# Patient Record
Sex: Female | Born: 1985
Health system: Southern US, Community
[De-identification: ages and names within clinical notes are randomized; demographics above are authoritative.]

## PROBLEM LIST (undated history)

## (undated) DIAGNOSIS — K5900 Constipation, unspecified: Secondary | ICD-10-CM

## (undated) DIAGNOSIS — F419 Anxiety disorder, unspecified: Secondary | ICD-10-CM

## (undated) DIAGNOSIS — F32A Depression, unspecified: Secondary | ICD-10-CM

## (undated) DIAGNOSIS — D649 Anemia, unspecified: Secondary | ICD-10-CM

## (undated) DIAGNOSIS — R0602 Shortness of breath: Secondary | ICD-10-CM

## (undated) DIAGNOSIS — R002 Palpitations: Secondary | ICD-10-CM

## (undated) DIAGNOSIS — F909 Attention-deficit hyperactivity disorder, unspecified type: Secondary | ICD-10-CM

## (undated) DIAGNOSIS — K259 Gastric ulcer, unspecified as acute or chronic, without hemorrhage or perforation: Secondary | ICD-10-CM

## (undated) DIAGNOSIS — F988 Other specified behavioral and emotional disorders with onset usually occurring in childhood and adolescence: Secondary | ICD-10-CM

## (undated) DIAGNOSIS — M549 Dorsalgia, unspecified: Secondary | ICD-10-CM

## (undated) DIAGNOSIS — M439 Deforming dorsopathy, unspecified: Secondary | ICD-10-CM

## (undated) DIAGNOSIS — K219 Gastro-esophageal reflux disease without esophagitis: Secondary | ICD-10-CM

## (undated) DIAGNOSIS — K589 Irritable bowel syndrome without diarrhea: Secondary | ICD-10-CM

## (undated) HISTORY — DX: Constipation, unspecified: K59.00

## (undated) HISTORY — DX: Dorsalgia, unspecified: M54.9

## (undated) HISTORY — PX: WISDOM TOOTH EXTRACTION: SHX21

## (undated) HISTORY — DX: Depression, unspecified: F32.A

## (undated) HISTORY — DX: Shortness of breath: R06.02

## (undated) HISTORY — DX: Irritable bowel syndrome, unspecified: K58.9

## (undated) HISTORY — DX: Palpitations: R00.2

## (undated) HISTORY — DX: Deforming dorsopathy, unspecified: M43.9

## (undated) HISTORY — DX: Gastro-esophageal reflux disease without esophagitis: K21.9

## (undated) HISTORY — DX: Anxiety disorder, unspecified: F41.9

## (undated) HISTORY — DX: Other specified behavioral and emotional disorders with onset usually occurring in childhood and adolescence: F98.8

## (undated) HISTORY — DX: Gastric ulcer, unspecified as acute or chronic, without hemorrhage or perforation: K25.9

## (undated) HISTORY — DX: Anemia, unspecified: D64.9

## (undated) HISTORY — DX: Attention-deficit hyperactivity disorder, unspecified type: F90.9

---

## 2012-10-04 ENCOUNTER — Telehealth: Payer: Self-pay | Admitting: Family Medicine

## 2012-10-04 ENCOUNTER — Ambulatory Visit (INDEPENDENT_AMBULATORY_CARE_PROVIDER_SITE_OTHER): Payer: BC Managed Care – PPO | Admitting: Physician Assistant

## 2012-10-04 ENCOUNTER — Encounter: Payer: Self-pay | Admitting: Physician Assistant

## 2012-10-04 VITALS — BP 127/80 | HR 78 | Temp 97.3°F | Ht 64.0 in | Wt 233.0 lb

## 2012-10-04 DIAGNOSIS — M542 Cervicalgia: Secondary | ICD-10-CM

## 2012-10-04 DIAGNOSIS — G44209 Tension-type headache, unspecified, not intractable: Secondary | ICD-10-CM

## 2012-10-04 DIAGNOSIS — R5383 Other fatigue: Secondary | ICD-10-CM

## 2012-10-04 DIAGNOSIS — R5381 Other malaise: Secondary | ICD-10-CM

## 2012-10-04 DIAGNOSIS — R7989 Other specified abnormal findings of blood chemistry: Secondary | ICD-10-CM

## 2012-10-04 LAB — THYROID PANEL WITH TSH
Free Thyroxine Index: 3.2 (ref 1.0–3.9)
T4, Total: 9.8 ug/dL (ref 5.0–12.5)
TSH: 1.715 u[IU]/mL (ref 0.350–4.500)

## 2012-10-04 LAB — POCT CBC
Granulocyte percent: 73.3 %G (ref 37–80)
Lymph, poc: 2.4 (ref 0.6–3.4)
MPV: 8.1 fL (ref 0–99.8)
POC LYMPH PERCENT: 22.2 %L (ref 10–50)
Platelet Count, POC: 364 10*3/uL (ref 142–424)
RBC: 4.5 M/uL (ref 4.04–5.48)

## 2012-10-04 LAB — BASIC METABOLIC PANEL WITH GFR
Chloride: 101 mEq/L (ref 96–112)
Creat: 0.59 mg/dL (ref 0.50–1.10)
GFR, Est African American: >89 mL/min
GFR, Est Non African American: >89 mL/min
Potassium: 4.4 mEq/L (ref 3.5–5.3)

## 2012-10-04 LAB — HEPATIC FUNCTION PANEL
ALT: 11 U/L (ref 0–35)
Albumin: 3.9 g/dL (ref 3.5–5.2)
Alkaline Phosphatase: 112 U/L (ref 39–117)
Total Protein: 6.7 g/dL (ref 6.0–8.3)

## 2012-10-04 MED ORDER — CYCLOBENZAPRINE HCL 10 MG PO TABS: 10.0000 mg | ORAL_TABLET | Freq: Three times a day (TID) | ORAL | Status: DC | PRN

## 2012-10-04 MED ORDER — MELOXICAM 15 MG PO TABS: 15.0000 mg | ORAL_TABLET | Freq: Every day | ORAL | Status: DC

## 2012-10-04 NOTE — Telephone Encounter (Signed)
APPT GIVEN

## 2012-10-04 NOTE — Progress Notes (Signed)
Subjective:     Patient ID: Brittney Farmer, female   DOB: 1985/11/30, 27 y.o.   MRN: [id number]  HPI Pt with intermit post HA over the last several weeks Headaches will radiate to the top in stocking cap fashion Sx have now gotten worse over the last several days She does not have a migraine hx No prev head trauma + N, and photophobia She states she is under a lot of stress at work She also spends a lot of time on the computer at work No meds tried Pt also with some concern of fatigue and weight gain   Review of Systems  All other systems reviewed and are negative.       Objective:   Physical Exam  Nursing note and vitals reviewed.  PERRLA EOMI CN 2-12 intact FROM C-spine with sx on flexion/rotation + TTP entire trap region Shoulder shrug equal Good strength UE bilat Pulses/sensory good    Assessment:     1. Other malaise and fatigue   2. Elevated LFTs   3. Neck pain   4. Tension headache        Plan:     Mobic/Flexeril rx for headache- SE reviewed Heat/Ice Massage Stretching Make sure of proper position of computer screen at work  Work note given F/U prn

## 2012-10-04 NOTE — Patient Instructions (Signed)

## 2013-05-16 ENCOUNTER — Ambulatory Visit (INDEPENDENT_AMBULATORY_CARE_PROVIDER_SITE_OTHER): Payer: BC Managed Care – PPO | Admitting: Nurse Practitioner

## 2013-05-16 ENCOUNTER — Encounter: Payer: Self-pay | Admitting: Nurse Practitioner

## 2013-05-16 VITALS — BP 119/75 | HR 75 | Temp 97.1°F | Ht 64.0 in | Wt 236.0 lb

## 2013-05-16 DIAGNOSIS — J019 Acute sinusitis, unspecified: Secondary | ICD-10-CM

## 2013-05-16 DIAGNOSIS — R51 Headache: Secondary | ICD-10-CM

## 2013-05-16 MED ORDER — CHLORPHEN-PE-ACETAMINOPHEN 4-10-325 MG PO TABS: 1.0000 | ORAL_TABLET | Freq: Every day | ORAL | Status: DC

## 2013-05-16 MED ORDER — AZITHROMYCIN 250 MG PO TABS
ORAL_TABLET | ORAL | Status: DC
Start: 2013-05-16 — End: 2014-02-14

## 2013-05-16 NOTE — Progress Notes (Signed)
   Subjective:    Patient ID: Brittney Farmer, female    DOB: Sep 02, 1985, 28 y.o.   MRN: [id number]  HPI Patient in c/o headache- Brittney Farmer that she went to urgent care 2 weeks ago and was diagnosed with URI and was given prednisone, cough meds and tylenol with codeine. Patient says that she is no better and has had a headache intermittently for 2 weeks.     Review of Systems  Constitutional: Negative for fever, chills and appetite change.  HENT: Positive for congestion, rhinorrhea, sinus pressure and sore throat. Negative for trouble swallowing.   Respiratory: Positive for cough (slight).   Cardiovascular: Negative.   All other systems reviewed and are negative.       Objective:   Physical Exam  Constitutional: She is oriented to person, place, and time. She appears well-developed and well-nourished.  HENT:  Right Ear: Hearing, tympanic membrane, external ear and ear canal normal.  Left Ear: Hearing, tympanic membrane, external ear and ear canal normal.  Nose: Mucosal edema and rhinorrhea present. Right sinus exhibits frontal sinus tenderness. Right sinus exhibits no maxillary sinus tenderness. Left sinus exhibits frontal sinus tenderness.  Mouth/Throat: Uvula is midline, oropharynx is clear and moist and mucous membranes are normal.  Eyes: Pupils are equal, round, and reactive to light.  Neck: Normal range of motion. Neck supple.  Cardiovascular: Normal rate, regular rhythm and normal heart sounds.   Pulmonary/Chest: Effort normal and breath sounds normal. No respiratory distress. She has no wheezes. She has no rales.  Wet cough  Abdominal: Soft. Bowel sounds are normal.  Lymphadenopathy:    She has no cervical adenopathy.  Neurological: She is alert and oriented to person, place, and time.  Skin: Skin is warm and dry.    BP 119/75  Pulse 75  Temp(Src) 97.1 F (36.2 C) (Oral)  Ht 5\' 4"  (1.626 m)  Wt 236 lb (107.049 kg)  BMI 40.49 kg/m2  LMP 05/08/2013       Assessment &  Plan:   1. Headache(784.0)   2. Sinusitis, acute    Meds ordered this encounter  Medications  . azithromycin (ZITHROMAX Z-PAK) 250 MG tablet    Sig: As directed    Dispense:  6 each    Refill:  0    Order Specific Question:  Supervising Provider    Answer:  Chipper Herb [[id number]]  . Chlorphen-PE-Acetaminophen 4-10-325 MG TABS    Sig: Take 1 tablet by mouth daily.    Dispense:  20 tablet    Refill:  0    Order Specific Question:  Supervising Provider    Answer:  Chipper Herb [[id number]]   1. Take meds as prescribed 2. Use a cool mist humidifier especially during the winter months and when heat has been humid. 3. Use saline nose sprays frequently 4. Saline irrigations of the nose can be very helpful if done frequently.  * 4X daily for 1 week*  * Use of a nettie pot can be helpful with this. Follow directions with this* 5. Drink plenty of fluids 6. Keep thermostat turn down low 7.For any cough or congestion  Use plain Mucinex- regular strength or max strength is fine   * Children- consult with Pharmacist for dosing 8. For fever or aces or pains- take tylenol or ibuprofen appropriate for age and weight.  * for fevers greater than 101 orally you may alternate ibuprofen and tylenol every  3 hours.   Mary-Margaret Hassell Done, FNP

## 2013-05-16 NOTE — Patient Instructions (Signed)

## 2014-02-11 ENCOUNTER — Telehealth: Payer: Self-pay | Admitting: Family Medicine

## 2014-02-11 NOTE — Telephone Encounter (Signed)
Appointment scheduled for Friday with [facility]

## 2014-02-14 ENCOUNTER — Encounter: Payer: Self-pay | Admitting: Family Medicine

## 2014-02-14 ENCOUNTER — Ambulatory Visit (INDEPENDENT_AMBULATORY_CARE_PROVIDER_SITE_OTHER): Payer: Medicaid Other | Admitting: Family Medicine

## 2014-02-14 VITALS — BP 127/70 | HR 90 | Temp 98.2°F | Ht 64.0 in | Wt 222.8 lb

## 2014-02-14 DIAGNOSIS — K2100 Gastro-esophageal reflux disease with esophagitis, without bleeding: Secondary | ICD-10-CM

## 2014-02-14 DIAGNOSIS — R1011 Right upper quadrant pain: Secondary | ICD-10-CM

## 2014-02-14 DIAGNOSIS — F411 Generalized anxiety disorder: Secondary | ICD-10-CM

## 2014-02-14 DIAGNOSIS — F32A Depression, unspecified: Secondary | ICD-10-CM

## 2014-02-14 DIAGNOSIS — R5383 Other fatigue: Secondary | ICD-10-CM

## 2014-02-14 DIAGNOSIS — F329 Major depressive disorder, single episode, unspecified: Secondary | ICD-10-CM

## 2014-02-14 LAB — POCT CBC
Granulocyte percent: 67.5 %G (ref 37–80)
HCT, POC: 38.7 % (ref 37.7–47.9)
Hemoglobin: 12.2 g/dL (ref 12.2–16.2)
Lymph, poc: 2.2 (ref 0.6–3.4)
MCH, POC: 25.7 pg — AB (ref 27–31.2)
MCHC: 31.6 g/dL — AB (ref 31.8–35.4)
MCV: 81.3 fL (ref 80–97)
MPV: 8.4 fL (ref 0–99.8)
POC Granulocyte: 5.5 (ref 2–6.9)
POC LYMPH PERCENT: 27.5 %L (ref 10–50)
Platelet Count, POC: 365 10*3/uL (ref 142–424)
RBC: 4.8 M/uL (ref 4.04–5.48)
RDW, POC: 15 %
WBC: 8.1 10*3/uL (ref 4.6–10.2)

## 2014-02-14 MED ORDER — OMEPRAZOLE 20 MG PO CPDR: 20.0000 mg | DELAYED_RELEASE_CAPSULE | Freq: Every day | ORAL | Status: DC

## 2014-02-14 MED ORDER — SERTRALINE HCL 50 MG PO TABS
50.0000 mg | ORAL_TABLET | Freq: Every day | ORAL | Status: DC
Start: 2014-02-14 — End: 2014-06-06

## 2014-02-14 NOTE — Progress Notes (Signed)
Subjective:    Patient ID: Brittney Farmer, female    DOB: 11/15/1985, 28 y.o.   MRN: [id number]  HPI This 28 y.o. female presents for evaluation of having some abdominal pain.  She had EGD and it shows gastritis. She states she was told to follow up with her PCP.  The report mentions that she should be tx'd with PPI and antidepressant medicine.  I ask patient if having depression and she admits to depressive.   Review of Systems No chest pain, SOB, HA, dizziness, vision change, N/V, diarrhea, constipation, dysuria, urinary urgency or frequency, myalgias, arthralgias or rash.     Objective:   Physical Exam Vital signs noted  Well developed well nourished female.  HEENT - Head atraumatic Normocephalic                Eyes - PERRLA, Conjuctiva - clear Sclera- Clear EOMI                Ears - EAC's Wnl TM's Wnl Gross Hearing WNL                Nose - Nares patent                 Throat - oropharanx wnl Respiratory - Lungs CTA bilateral Cardiac - RRR S1 and S2 without murmur GI - Abdomen soft Nontender and bowel sounds active x 4 Extremities - No edema. Neuro - Grossly intact.       Assessment & Plan:  GAD (generalized anxiety disorder) - Plan: sertraline (ZOLOFT) 50 MG tablet  Gastroesophageal reflux disease with esophagitis - Plan: omeprazole (PRILOSEC) 20 MG capsule, Lipid panel  Right upper quadrant pain - Plan: US Abdomen Limited RUQ  Fatigue due to depression - Plan: POCT CBC, CMP14+EGFR, Thyroid Panel With TSH, Vitamin B12  William J Oxford FNP 

## 2014-02-15 LAB — CMP14+EGFR
ALT: 8 IU/L (ref 0–32)
AST: 10 IU/L (ref 0–40)
Albumin/Globulin Ratio: 1.5 (ref 1.1–2.5)
Albumin: 4.2 g/dL (ref 3.5–5.5)
Alkaline Phosphatase: 114 IU/L (ref 39–117)
BUN/Creatinine Ratio: 11 (ref 8–20)
BUN: 7 mg/dL (ref 6–20)
CO2: 23 mmol/L (ref 18–29)
Calcium: 9 mg/dL (ref 8.7–10.2)
Chloride: 98 mmol/L (ref 97–108)
Creatinine, Ser: 0.63 mg/dL (ref 0.57–1.00)
GFR calc Af Amer: 143 mL/min/{1.73_m2} (ref >59)
GFR calc non Af Amer: 124 mL/min/{1.73_m2} (ref >59)
Globulin, Total: 2.8 g/dL (ref 1.5–4.5)
Glucose: 76 mg/dL (ref 65–99)
Potassium: 4 mmol/L (ref 3.5–5.2)
Sodium: 138 mmol/L (ref 134–144)
Total Bilirubin: 0.3 mg/dL (ref 0.0–1.2)
Total Protein: 7 g/dL (ref 6.0–8.5)

## 2014-02-15 LAB — THYROID PANEL WITH TSH
Free Thyroxine Index: 1.9 (ref 1.2–4.9)
T3 Uptake Ratio: 21 % — ABNORMAL LOW (ref 24–39)
T4, Total: 9 ug/dL (ref 4.5–12.0)
TSH: 1.1 u[IU]/mL (ref 0.450–4.500)

## 2014-02-15 LAB — VITAMIN B12: Vitamin B-12: 650 pg/mL (ref 211–946)

## 2014-02-15 LAB — LIPID PANEL
Chol/HDL Ratio: 4.6 ratio units — ABNORMAL HIGH (ref 0.0–4.4)
Cholesterol, Total: 184 mg/dL (ref 100–199)
HDL: 40 mg/dL (ref >39)
LDL Calculated: 125 mg/dL — ABNORMAL HIGH (ref 0–99)
Triglycerides: 96 mg/dL (ref 0–149)
VLDL Cholesterol Cal: 19 mg/dL (ref 5–40)

## 2014-02-18 ENCOUNTER — Ambulatory Visit (HOSPITAL_COMMUNITY)
Admission: RE | Admit: 2014-02-18 | Discharge: 2014-02-18 | Disposition: A | Discharge status: Home / Self Care | Payer: Medicaid Other | Source: Ambulatory Visit | Attending: Family Medicine | Admitting: Family Medicine

## 2014-02-18 ENCOUNTER — Telehealth: Payer: Self-pay | Admitting: Family Medicine

## 2014-02-18 DIAGNOSIS — R1011 Right upper quadrant pain: Secondary | ICD-10-CM | POA: Diagnosis present

## 2014-02-18 NOTE — Telephone Encounter (Signed)
Message copied by Waverly Ferrari on Tue Feb 18, 2014  3:39 PM ------      Message from: Lysbeth Penner      Created: Tue Feb 18, 2014  3:06 PM       Normal GB US ------

## 2014-03-18 ENCOUNTER — Ambulatory Visit (INDEPENDENT_AMBULATORY_CARE_PROVIDER_SITE_OTHER): Payer: Medicaid Other | Admitting: Family Medicine

## 2014-03-18 ENCOUNTER — Encounter: Payer: Self-pay | Admitting: Family Medicine

## 2014-03-18 VITALS — BP 109/66 | HR 72 | Temp 97.6°F | Ht 64.0 in | Wt 221.0 lb

## 2014-03-18 DIAGNOSIS — F411 Generalized anxiety disorder: Secondary | ICD-10-CM

## 2014-03-18 DIAGNOSIS — K219 Gastro-esophageal reflux disease without esophagitis: Secondary | ICD-10-CM

## 2014-03-18 MED ORDER — ALPRAZOLAM 0.5 MG PO TABS: 0.5000 mg | ORAL_TABLET | Freq: Two times a day (BID) | ORAL | Status: DC | PRN

## 2014-03-18 MED ORDER — OMEPRAZOLE 40 MG PO CPDR: 40.0000 mg | DELAYED_RELEASE_CAPSULE | Freq: Every day | ORAL | Status: DC

## 2014-03-18 NOTE — Progress Notes (Signed)
   Subjective:    Patient ID: Brittney Farmer, female    DOB: 08-26-1985, 28 y.o.   MRN: [id number]  HPI Patient is here for follow up.  She has hx of GAD and GERD.  She is doing better but has breakthrough sx's of anxiety.  Review of Systems  Constitutional: Negative for fever.  HENT: Negative for ear pain.   Eyes: Negative for discharge.  Respiratory: Negative for cough.   Cardiovascular: Negative for chest pain.  Gastrointestinal: Negative for abdominal distention.  Endocrine: Negative for polyuria.  Genitourinary: Negative for difficulty urinating.  Musculoskeletal: Negative for gait problem and neck pain.  Skin: Negative for color change and rash.  Neurological: Negative for speech difficulty and headaches.  Psychiatric/Behavioral: Negative for agitation.       Objective:    BP 109/66 mmHg  Pulse 72  Temp(Src) 97.6 F (36.4 C) (Oral)  Ht 5\' 4"  (1.626 m)  Wt 221 lb (100.245 kg)  BMI 37.92 kg/m2  LMP 03/11/2014 Physical Exam  Constitutional: She is oriented to person, place, and time. She appears well-developed and well-nourished.  HENT:  Head: Normocephalic and atraumatic.  Mouth/Throat: Oropharynx is clear and moist.  Eyes: Pupils are equal, round, and reactive to light.  Neck: Normal range of motion. Neck supple.  Cardiovascular: Normal rate and regular rhythm.   No murmur heard. Pulmonary/Chest: Effort normal and breath sounds normal.  Abdominal: Soft. Bowel sounds are normal. There is no tenderness.  Neurological: She is alert and oriented to person, place, and time.  Skin: Skin is warm and dry.  Psychiatric: She has a normal mood and affect.          Assessment & Plan:     ICD-9-CM ICD-10-CM   1. GAD (generalized anxiety disorder) 300.02 F41.1 ALPRAZolam (XANAX) 0.5 MG tablet  2. Gastroesophageal reflux disease without esophagitis 530.81 K21.9 omeprazole (PRILOSEC) 40 MG capsule     No Follow-up on file.  Lysbeth Penner FNP

## 2014-06-06 ENCOUNTER — Ambulatory Visit (INDEPENDENT_AMBULATORY_CARE_PROVIDER_SITE_OTHER): Payer: Medicaid Other | Admitting: Family Medicine

## 2014-06-06 ENCOUNTER — Encounter: Payer: Self-pay | Admitting: Family Medicine

## 2014-06-06 VITALS — BP 114/71 | HR 89 | Temp 98.4°F | Ht 64.0 in | Wt 230.8 lb

## 2014-06-06 DIAGNOSIS — R1011 Right upper quadrant pain: Secondary | ICD-10-CM

## 2014-06-06 DIAGNOSIS — K2100 Gastro-esophageal reflux disease with esophagitis, without bleeding: Secondary | ICD-10-CM | POA: Insufficient documentation

## 2014-06-06 DIAGNOSIS — G8929 Other chronic pain: Secondary | ICD-10-CM | POA: Diagnosis not present

## 2014-06-06 DIAGNOSIS — R1010 Upper abdominal pain, unspecified: Secondary | ICD-10-CM

## 2014-06-06 DIAGNOSIS — F411 Generalized anxiety disorder: Secondary | ICD-10-CM | POA: Insufficient documentation

## 2014-06-06 DIAGNOSIS — K589 Irritable bowel syndrome without diarrhea: Secondary | ICD-10-CM | POA: Diagnosis not present

## 2014-06-06 DIAGNOSIS — R002 Palpitations: Secondary | ICD-10-CM | POA: Diagnosis not present

## 2014-06-06 LAB — POCT CBC
GRANULOCYTE PERCENT: 68 % (ref 37–80)
HEMATOCRIT: 37.5 % — AB (ref 37.7–47.9)
Hemoglobin: 10.9 g/dL — AB (ref 12.2–16.2)
Lymph, poc: 1.9 (ref 0.6–3.4)
MCH, POC: 23.1 pg — AB (ref 27–31.2)
MCHC: 29.1 g/dL — AB (ref 31.8–35.4)
MCV: 79.5 fL — AB (ref 80–97)
MPV: 8.6 fL (ref 0–99.8)
PLATELET COUNT, POC: 373 10*3/uL (ref 142–424)
POC GRANULOCYTE: 4.6 (ref 2–6.9)
POC LYMPH %: 28.1 % (ref 10–50)
RBC: 4.7 M/uL (ref 4.04–5.48)
RDW, POC: 14.4 %
WBC: 6.7 10*3/uL (ref 4.6–10.2)

## 2014-06-06 MED ORDER — DICYCLOMINE HCL 10 MG PO CAPS: 10.0000 mg | ORAL_CAPSULE | Freq: Three times a day (TID) | ORAL | Status: DC

## 2014-06-06 MED ORDER — DEXLANSOPRAZOLE 60 MG PO CPDR: 60.0000 mg | DELAYED_RELEASE_CAPSULE | Freq: Every day | ORAL | Status: DC

## 2014-06-06 MED ORDER — DULOXETINE HCL 30 MG PO CPEP: 30.0000 mg | ORAL_CAPSULE | Freq: Every day | ORAL | Status: DC

## 2014-06-06 NOTE — Patient Instructions (Signed)
Diet and Irritable Bowel Syndrome  No cure has been found for irritable bowel syndrome (IBS). Many options are available to treat the symptoms. Your caregiver will give you the best treatments available for your symptoms. He or she will also encourage you to manage stress and to make changes to your diet. You need to work with your caregiver and Registered Dietician to find the best combination of medicine, diet, counseling, and support to control your symptoms. The following are some diet suggestions. FOODS THAT MAKE IBS WORSE  Fatty foods, such as [place] fries.  Milk products, such as cheese or ice cream.  Chocolate.  Alcohol.  Caffeine (found in coffee and some sodas).  Carbonated drinks, such as soda. If certain foods cause symptoms, you should eat less of them or stop eating them. FOOD JOURNAL   Keep a journal of the foods that seem to cause distress. Write down:  What you are eating during the day and when.  What problems you are having after eating.  When the symptoms occur in relation to your meals.  What foods always make you feel badly.  Take your notes with you to your caregiver to see if you should stop eating certain foods. FOODS THAT MAKE IBS BETTER Fiber reduces IBS symptoms, especially constipation, because it makes stools soft, bulky, and easier to pass. Fiber is found in bran, bread, cereal, beans, fruit, and vegetables. Examples of foods with fiber include:  Apples.  Peaches.  Pears.  Berries.  Figs.  Broccoli, raw.  Cabbage.  Carrots.  Raw peas.  Kidney beans.  Lima beans.  Whole-grain bread.  Whole-grain cereal. Add foods with fiber to your diet a little at a time. This will let your body get used to them. Too much fiber at once might cause gas and swelling of your abdomen. This can trigger symptoms in a person with IBS. Caregivers usually recommend a diet with enough fiber to produce soft, painless bowel movements. High fiber diets may  cause gas and bloating. However, these symptoms often go away within a few weeks, as your body adjusts. In many cases, dietary fiber may lessen IBS symptoms, particularly constipation. However, it may not help pain or diarrhea. High fiber diets keep the colon mildly enlarged (distended) with the added fiber. This may help prevent spasms in the colon. Some forms of fiber also keep water in the stool, thereby preventing hard stools that are difficult to pass.  Besides telling you to eat more foods with fiber, your caregiver may also tell you to get more fiber by taking a fiber pill or drinking water mixed with a special high fiber powder. An example of this is a natural fiber laxative containing psyllium seed.  TIPS  Large meals can cause cramping and diarrhea in people with IBS. If this happens to you, try eating 4 or 5 small meals a day, or try eating less at each of your usual 3 meals. It may also help if your meals are low in fat and high in carbohydrates. Examples of carbohydrates are pasta, rice, whole-grain breads and cereals, fruits, and vegetables.  If dairy products cause your symptoms to flare up, you can try eating less of those foods. You might be able to handle yogurt better than other dairy products, because it contains bacteria that helps with digestion. Dairy products are an important source of calcium and other nutrients. If you need to avoid dairy products, be sure to talk with a Registered Dietitian about getting these nutrients  through other food sources.  Drink enough water and fluids to keep your urine clear or pale yellow. This is important, especially if you have diarrhea. FOR MORE INFORMATION  International Foundation for Functional Gastrointestinal Disorders: www.iffgd.org  National Digestive Diseases Information Clearinghouse: digestive.AmenCredit.is Document Released: 07/09/2003 Document Revised: 07/11/2011 Document Reviewed: 07/19/2013 [facility] Patient Information 2015  [place], [place]. This information is not intended to replace advice given to you by your health care provider. Make sure you discuss any questions you have with your health care provider.

## 2014-06-06 NOTE — Progress Notes (Signed)
Subjective:    Patient ID: Brittney Farmer, female    DOB: [birth year], 29 y.o.   MRN: [id number]  HPI  Patient is here today for a follow up of chronic medical problems which include GERD and GAD.  Patient just career in today for follow-up on her reflux and states that she's been told by GI that it was gastritis. She is concerned about the possibility of irritable bowel syndrome for the following reasons. First she has for about 2-1/2 years now had multiple bowel movements a day. That comes on at random times but frequently right after eating. It starts with a significant pain followed by a bowel movement that frequently will be diarrhea. She says she has at least 5 a day sometimes several more. The pain is a mid task cramp sharp burning dull sharp and may persist after her bowel movement even. She does have heartburn she mentions a few days ago that goes up into the chest as well. This is in spite of her use of omeprazole. She is taking that on a regular basis daily. Patient is also treated for anxiety and there is some concern that the IBS may be interacting interlock interlocked with that. Of note is that she also has chest pain and palpitations. Sometimes lasting for several minutes associated with chest pain she just has to stop whatever she is doing and rest until it goes away. It can last up to 10 minutes.       No Known Allergies  Outpatient Encounter Prescriptions as of 06/06/2014  Medication Sig  . ALPRAZolam (XANAX) 0.5 MG tablet Take 1 tablet (0.5 mg total) by mouth 2 (two) times daily as needed for anxiety.  Marland Kitchen omeprazole (PRILOSEC) 40 MG capsule Take 1 capsule (40 mg total) by mouth daily.  . sertraline (ZOLOFT) 50 MG tablet Take 1 tablet (50 mg total) by mouth daily.    No past medical history on file.  No past surgical history on file.  History   Social History  . Marital Status: Single    Spouse Name: N/A    Number of Children: N/A  . Years of Education: N/A    Occupational History  . Not on file.   Social History Main Topics  . Smoking status: Never Smoker   . Smokeless tobacco: Not on file  . Alcohol Use: Not on file  . Drug Use: Not on file  . Sexual Activity: Not on file   Other Topics Concern  . Not on file   Social History Narrative    Review of Systems  Constitutional: Negative for fever, chills, diaphoresis, appetite change, fatigue and unexpected weight change.  HENT: Negative for congestion, ear pain, hearing loss, postnasal drip, rhinorrhea, sneezing, sore throat and trouble swallowing.   Eyes: Negative for pain.  Respiratory: Negative for cough, chest tightness and shortness of breath.   Cardiovascular: Negative for chest pain and palpitations.  Gastrointestinal: Negative for nausea, vomiting, abdominal pain, diarrhea and constipation.  Genitourinary: Negative for dysuria, frequency and menstrual problem.  Musculoskeletal: Negative for joint swelling and arthralgias.  Skin: Negative for rash.  Neurological: Negative for dizziness, weakness, numbness and headaches.  Psychiatric/Behavioral: Negative for dysphoric mood and agitation.       Objective:   Physical Exam  Constitutional: She is oriented to person, place, and time. She appears well-developed and well-nourished. No distress.  HENT:  Head: Normocephalic and atraumatic.  Right Ear: External ear normal.  Left Ear: External ear normal.  Nose: Nose normal.  Mouth/Throat: Oropharynx is clear and moist.  Eyes: Conjunctivae and EOM are normal. Pupils are equal, round, and reactive to light.  Neck: Normal range of motion. Neck supple. No thyromegaly present.  Cardiovascular: Normal rate, regular rhythm and normal heart sounds.   No murmur heard. Pulmonary/Chest: Effort normal and breath sounds normal. No respiratory distress. She has no wheezes. She has no rales.  Abdominal: Soft. Bowel sounds are normal. She exhibits no distension. There is no tenderness.   Lymphadenopathy:    She has no cervical adenopathy.  Neurological: She is alert and oriented to person, place, and time. She has normal reflexes.  Skin: Skin is warm and dry.  Psychiatric: She has a normal mood and affect. Her behavior is normal. Judgment and thought content normal.   BP 114/71 mmHg  Pulse 89  Temp(Src) 98.4 F (36.9 C) (Oral)  Ht 5' 4"  (1.626 m)  Wt 230 lb 12.8 oz (104.69 kg)  BMI 39.60 kg/m2  LMP 05/12/2014 (Approximate)        Assessment & Plan:   1. GAD (generalized anxiety disorder)   2. Gastroesophageal reflux disease with esophagitis   3. IBS (irritable bowel syndrome)   4. Abdominal pain, chronic, right upper quadrant   5. Palpitations     Meds ordered this encounter  Medications  . DULoxetine (CYMBALTA) 30 MG capsule    Sig: Take 1 capsule (30 mg total) by mouth daily.    Dispense:  30 capsule    Refill:  0  . dexlansoprazole (DEXILANT) 60 MG capsule    Sig: Take 1 capsule (60 mg total) by mouth daily.    Dispense:  30 capsule    Refill:  5  . dicyclomine (BENTYL) 10 MG capsule    Sig: Take 1 capsule (10 mg total) by mouth 4 (four) times daily -  before meals and at bedtime.    Dispense:  120 capsule    Refill:  5    Orders Placed This Encounter  Procedures  . CT Abd Wo & W Cm    Standing Status: Future     Number of Occurrences:      Standing Expiration Date: 09/04/2015    Order Specific Question:  Reason for Exam (SYMPTOM  OR DIAGNOSIS REQUIRED)    Answer:  RLQ pain    Order Specific Question:  Is the patient pregnant?    Answer:  No    Order Specific Question:  Preferred imaging location?    Answer:  Internal  . CMP14+EGFR  . Celiac Disease Panel  . Amb ref to Medical Nutrition Therapy-MNT    Referral Priority:  Routine    Referral Type:  Consultation    Referral Reason:  Specialty Services Required    Requested Specialty:  Nutrition    Number of Visits Requested:  1  . POCT CBC    Labs pending Health Maintenance  reviewed Diet and exercise encouraged Continue all meds as discussed Follow up in 4 wks  Claretta Fraise, MD

## 2014-06-08 LAB — CMP14+EGFR
A/G RATIO: 1.5 (ref 1.1–2.5)
ALT: 8 IU/L (ref 0–32)
AST: 8 IU/L (ref 0–40)
Albumin: 4.1 g/dL (ref 3.5–5.5)
Alkaline Phosphatase: 126 IU/L — ABNORMAL HIGH (ref 39–117)
BILIRUBIN TOTAL: 0.5 mg/dL (ref 0.0–1.2)
BUN / CREAT RATIO: 17 (ref 8–20)
BUN: 10 mg/dL (ref 6–20)
CO2: 23 mmol/L (ref 18–29)
Calcium: 9.1 mg/dL (ref 8.7–10.2)
Chloride: 99 mmol/L (ref 97–108)
Creatinine, Ser: 0.6 mg/dL (ref 0.57–1.00)
GFR calc Af Amer: 145 mL/min/{1.73_m2} (ref >59)
GFR, EST NON AFRICAN AMERICAN: 126 mL/min/{1.73_m2} (ref >59)
GLUCOSE: 82 mg/dL (ref 65–99)
Globulin, Total: 2.8 g/dL (ref 1.5–4.5)
Potassium: 4.2 mmol/L (ref 3.5–5.2)
Sodium: 136 mmol/L (ref 134–144)
Total Protein: 6.9 g/dL (ref 6.0–8.5)

## 2014-06-08 LAB — CELIAC DISEASE PANEL
Endomysial IgA: NEGATIVE
IgA/Immunoglobulin A, Serum: 305 mg/dL (ref 91–414)
Transglutaminase IgA: <2 U/mL (ref 0–3)

## 2014-07-07 ENCOUNTER — Other Ambulatory Visit: Payer: Medicaid Other | Admitting: Nurse Practitioner

## 2014-07-07 ENCOUNTER — Ambulatory Visit: Payer: Medicaid Other | Admitting: Family Medicine

## 2014-07-07 ENCOUNTER — Other Ambulatory Visit: Payer: Medicaid Other | Admitting: Family

## 2014-07-08 ENCOUNTER — Ambulatory Visit: Payer: Medicaid Other | Admitting: Family Medicine

## 2014-07-16 ENCOUNTER — Other Ambulatory Visit: Payer: Self-pay

## 2014-07-16 DIAGNOSIS — K589 Irritable bowel syndrome without diarrhea: Secondary | ICD-10-CM

## 2014-07-16 DIAGNOSIS — F411 Generalized anxiety disorder: Secondary | ICD-10-CM

## 2014-07-16 MED ORDER — DULOXETINE HCL 30 MG PO CPEP: 30.0000 mg | ORAL_CAPSULE | Freq: Every day | ORAL | Status: DC

## 2014-07-22 ENCOUNTER — Ambulatory Visit (INDEPENDENT_AMBULATORY_CARE_PROVIDER_SITE_OTHER): Payer: Medicaid Other | Admitting: Family Medicine

## 2014-07-22 ENCOUNTER — Encounter: Payer: Self-pay | Admitting: Family Medicine

## 2014-07-22 VITALS — BP 110/72 | HR 79 | Temp 98.2°F | Ht 64.0 in | Wt 233.4 lb

## 2014-07-22 DIAGNOSIS — F411 Generalized anxiety disorder: Secondary | ICD-10-CM | POA: Diagnosis not present

## 2014-07-22 DIAGNOSIS — K589 Irritable bowel syndrome without diarrhea: Secondary | ICD-10-CM

## 2014-07-22 MED ORDER — DICYCLOMINE HCL 10 MG PO CAPS: 10.0000 mg | ORAL_CAPSULE | Freq: Two times a day (BID) | ORAL | Status: DC

## 2014-07-22 MED ORDER — DULOXETINE HCL 60 MG PO CPEP: 60.0000 mg | ORAL_CAPSULE | Freq: Every day | ORAL | Status: DC

## 2014-07-22 MED ORDER — LUBIPROSTONE 8 MCG PO CAPS: 8.0000 ug | ORAL_CAPSULE | Freq: Two times a day (BID) | ORAL | Status: DC

## 2014-07-22 MED ORDER — OMEPRAZOLE 40 MG PO CPDR: 40.0000 mg | DELAYED_RELEASE_CAPSULE | Freq: Two times a day (BID) | ORAL | Status: DC

## 2014-07-22 NOTE — Patient Instructions (Signed)
Use vitamin B complex for energy.

## 2014-07-22 NOTE — Progress Notes (Signed)
Subjective:  Patient ID: Brittney Farmer, female    DOB: 08-27-1985  Age: 29 y.o. MRN: [id number]  CC: GAD and Gastrophageal Reflux   HPI Brittney Farmer presents for stomach anxiety and depression have worsened. LBM 4-5 times daily. Right after eating. States she did better with omeprazole than with the dexilant.  History Brittney Farmer has no past medical history on file.   She has no past surgical history on file.   Her family history is not on file.She reports that she has never smoked. She does not have any smokeless tobacco history on file. Her alcohol and drug histories are not on file.  Current Outpatient Prescriptions on File Prior to Visit  Medication Sig Dispense Refill  . ALPRAZolam (XANAX) 0.5 MG tablet Take 1 tablet (0.5 mg total) by mouth 2 (two) times daily as needed for anxiety. 60 tablet 3   No current facility-administered medications on file prior to visit.    ROS Review of Systems  Constitutional: Negative for fever, chills, diaphoresis, appetite change, fatigue and unexpected weight change.  HENT: Negative for congestion, ear pain, hearing loss, postnasal drip, rhinorrhea, sneezing, sore throat and trouble swallowing.   Eyes: Negative for pain.  Respiratory: Negative for cough, chest tightness and shortness of breath.   Cardiovascular: Negative for chest pain and palpitations.  Gastrointestinal: Negative for nausea, vomiting, abdominal pain, diarrhea and constipation.  Genitourinary: Negative for dysuria, frequency and menstrual problem.  Musculoskeletal: Negative for joint swelling and arthralgias.  Skin: Negative for rash.  Neurological: Negative for dizziness, weakness, numbness and headaches.  Psychiatric/Behavioral: Negative for dysphoric mood and agitation.    Objective:  BP 110/72 mmHg  Pulse 79  Temp(Src) 98.2 F (36.8 C) (Oral)  Ht 5\' 4"  (1.626 m)  Wt 233 lb 6.4 oz (105.87 kg)  BMI 40.04 kg/m2  LMP 07/04/2014  Physical Exam  Constitutional: She  is oriented to person, place, and time. She appears well-developed and well-nourished. No distress.  HENT:  Head: Normocephalic and atraumatic.  Right Ear: External ear normal.  Left Ear: External ear normal.  Nose: Nose normal.  Mouth/Throat: Oropharynx is clear and moist.  Eyes: Conjunctivae and EOM are normal. Pupils are equal, round, and reactive to light.  Neck: Normal range of motion. Neck supple. No thyromegaly present.  Cardiovascular: Normal rate, regular rhythm and normal heart sounds.   No murmur heard. Pulmonary/Chest: Effort normal and breath sounds normal. No respiratory distress. She has no wheezes. She has no rales.  Abdominal: Soft. Bowel sounds are normal. She exhibits distension. There is tenderness (mild and diffuse).  Lymphadenopathy:    She has no cervical adenopathy.  Neurological: She is alert and oriented to person, place, and time. She has normal reflexes.  Skin: Skin is warm and dry.  Psychiatric: She has a normal mood and affect. Her behavior is normal. Judgment and thought content normal.    Assessment & Plan:   Brittney Farmer was seen today for gad and gastrophageal reflux.  Diagnoses and all orders for this visit:  GAD (generalized anxiety disorder) Orders: -     DULoxetine (CYMBALTA) 60 MG capsule; Take 1 capsule (60 mg total) by mouth daily.  IBS (irritable bowel syndrome) Orders: -     DULoxetine (CYMBALTA) 60 MG capsule; Take 1 capsule (60 mg total) by mouth daily. -     dicyclomine (BENTYL) 10 MG capsule; Take 1 capsule (10 mg total) by mouth 2 (two) times daily with a meal.  Other orders -     omeprazole (  PRILOSEC) 40 MG capsule; Take 1 capsule (40 mg total) by mouth 2 (two) times daily. -     lubiprostone (AMITIZA) 8 MCG capsule; Take 1 capsule (8 mcg total) by mouth 2 (two) times daily with a meal.  I have discontinued Brittney Farmer's dexlansoprazole. I have also changed her omeprazole, DULoxetine, and dicyclomine. Additionally, I am having her  start on lubiprostone. Lastly, I am having her maintain her ALPRAZolam.  Meds ordered this encounter  Medications  . DISCONTD: omeprazole (PRILOSEC) 40 MG capsule    Sig: Take 1 capsule by mouth 2 (two) times daily.    Refill:  11  . omeprazole (PRILOSEC) 40 MG capsule    Sig: Take 1 capsule (40 mg total) by mouth 2 (two) times daily.    Dispense:  60 capsule    Refill:  11  . lubiprostone (AMITIZA) 8 MCG capsule    Sig: Take 1 capsule (8 mcg total) by mouth 2 (two) times daily with a meal.    Dispense:  60 capsule    Refill:  2  . DULoxetine (CYMBALTA) 60 MG capsule    Sig: Take 1 capsule (60 mg total) by mouth daily.    Dispense:  30 capsule    Refill:  5  . dicyclomine (BENTYL) 10 MG capsule    Sig: Take 1 capsule (10 mg total) by mouth 2 (two) times daily with a meal.    Dispense:  120 capsule    Refill:  5     Follow-up: Return in about 1 month (around 08/22/2014).  Claretta Fraise, M.D.

## 2014-07-25 ENCOUNTER — Ambulatory Visit: Payer: Medicaid Other | Admitting: Skilled Nursing Facility1

## 2014-07-31 ENCOUNTER — Encounter: Payer: Self-pay | Admitting: *Deleted

## 2014-08-04 ENCOUNTER — Ambulatory Visit (INDEPENDENT_AMBULATORY_CARE_PROVIDER_SITE_OTHER): Payer: Medicaid Other | Admitting: Family

## 2014-08-04 ENCOUNTER — Encounter: Payer: Self-pay | Admitting: Family

## 2014-08-04 ENCOUNTER — Encounter: Payer: Self-pay | Admitting: *Deleted

## 2014-08-04 VITALS — BP 124/82 | HR 76 | Temp 97.6°F | Ht 64.0 in | Wt 233.6 lb

## 2014-08-04 DIAGNOSIS — K2100 Gastro-esophageal reflux disease with esophagitis, without bleeding: Secondary | ICD-10-CM

## 2014-08-04 DIAGNOSIS — F411 Generalized anxiety disorder: Secondary | ICD-10-CM

## 2014-08-04 DIAGNOSIS — R1030 Lower abdominal pain, unspecified: Secondary | ICD-10-CM

## 2014-08-04 DIAGNOSIS — F339 Major depressive disorder, recurrent, unspecified: Secondary | ICD-10-CM | POA: Insufficient documentation

## 2014-08-04 DIAGNOSIS — Z0000 Encounter for general adult medical examination without abnormal findings: Secondary | ICD-10-CM

## 2014-08-04 DIAGNOSIS — Z01419 Encounter for gynecological examination (general) (routine) without abnormal findings: Secondary | ICD-10-CM | POA: Diagnosis not present

## 2014-08-04 DIAGNOSIS — F32A Depression, unspecified: Secondary | ICD-10-CM

## 2014-08-04 DIAGNOSIS — K589 Irritable bowel syndrome without diarrhea: Secondary | ICD-10-CM | POA: Diagnosis not present

## 2014-08-04 DIAGNOSIS — F329 Major depressive disorder, single episode, unspecified: Secondary | ICD-10-CM | POA: Diagnosis not present

## 2014-08-04 LAB — POCT UA - MICROSCOPIC ONLY
Bacteria, U Microscopic: NEGATIVE
Casts, Ur, LPF, POC: NEGATIVE
Crystals, Ur, HPF, POC: NEGATIVE
Mucus, UA: NEGATIVE

## 2014-08-04 LAB — POCT URINALYSIS DIPSTICK
BILIRUBIN UA: NEGATIVE
GLUCOSE UA: NEGATIVE
Ketones, UA: NEGATIVE
Nitrite, UA: NEGATIVE
Protein, UA: NEGATIVE
Spec Grav, UA: 1.01
Urobilinogen, UA: NEGATIVE
pH, UA: 5

## 2014-08-04 NOTE — Addendum Note (Signed)
Addended by: Earlene Plater on: 08/04/2014 12:50 PM   Modules accepted: Miquel Dunn

## 2014-08-04 NOTE — Patient Instructions (Signed)

## 2014-08-04 NOTE — Progress Notes (Signed)
Subjective:    Patient ID: Brittney Farmer, female    DOB: 01-May-1986, 29 y.o.   MRN: [id number]  Pt presents to the office today for CPE with pap. Gynecologic Exam Pertinent negatives include no headaches or sore throat.  Gastrophageal Reflux She reports no belching, no choking, no coughing, no heartburn, no sore throat or no water brash. This is a chronic problem. The current episode started more than 1 year ago. The problem occurs rarely. The problem has been resolved. The symptoms are aggravated by lying down and certain foods. Risk factors include obesity. The treatment provided significant relief.  Anxiety Presents for follow-up visit. Onset was 1 to 6 months ago. The problem has been waxing and waning. Symptoms include depressed mood and nervous/anxious behavior. Patient reports no hyperventilation, insomnia, palpitations, panic or shortness of breath. Symptoms occur rarely. The symptoms are aggravated by family issues. Nighttime awakenings: none.   Her past medical history is significant for anxiety/panic attacks and depression. Past treatments include SSRIs and benzodiazephines. Compliance with prior treatments has been good.  IBS Pt currently taking bentyl BID and amitiza BID. Pt states these medications seem to be controlling the IBS well. No complaints at this time.   *Pt has an IUD present that she got placed in 11/12.   Review of Systems  Constitutional: Negative.   HENT: Negative.  Negative for sore throat.   Eyes: Negative.   Respiratory: Negative.  Negative for cough, choking and shortness of breath.   Cardiovascular: Negative.  Negative for palpitations.  Gastrointestinal: Negative.  Negative for heartburn.  Endocrine: Negative.   Genitourinary: Negative.   Musculoskeletal: Negative.   Neurological: Negative.  Negative for headaches.  Hematological: Negative.   Psychiatric/Behavioral: The patient is nervous/anxious. The patient does not have insomnia.   All other  systems reviewed and are negative.      Objective:   Physical Exam  Constitutional: She is oriented to person, place, and time. She appears well-developed and well-nourished. No distress.  HENT:  Head: Normocephalic and atraumatic.  Right Ear: External ear normal.  Left Ear: External ear normal.  Nose: Nose normal.  Mouth/Throat: Oropharynx is clear and moist.  Eyes: Pupils are equal, round, and reactive to light.  Neck: Normal range of motion. Neck supple. No thyromegaly present.  Cardiovascular: Normal rate, regular rhythm, normal heart sounds and intact distal pulses.   No murmur heard. Pulmonary/Chest: Effort normal and breath sounds normal. No respiratory distress. She has no wheezes. Right breast exhibits no inverted nipple, no mass, no nipple discharge, no skin change and no tenderness. Left breast exhibits no inverted nipple, no mass, no nipple discharge, no skin change and no tenderness. Breasts are symmetrical.  Abdominal: Soft. Bowel sounds are normal. She exhibits no distension. There is no tenderness.  Genitourinary:  Bimanual exam- no adnexal masses or tenderness, ovaries nonpalpable   Cervix parous and pink- No discharge  IUD string present   Musculoskeletal: Normal range of motion. She exhibits no edema or tenderness.  Neurological: She is alert and oriented to person, place, and time. She has normal reflexes. No cranial nerve deficit.  Skin: Skin is warm and dry.  Psychiatric: She has a normal mood and affect. Her behavior is normal. Judgment and thought content normal.  Vitals reviewed.     BP 124/82 mmHg  Pulse 76  Temp(Src) 97.6 F (36.4 C) (Oral)  Ht 5' 4"  (1.626 m)  Wt 233 lb 9.6 oz (105.96 kg)  BMI 40.08 kg/m2  LMP 07/04/2014  Assessment & Plan:  1. Lower abdominal pain - POCT urinalysis dipstick - POCT UA - Microscopic Only - CMP14+EGFR  2. Gastroesophageal reflux disease with esophagitis - CMP14+EGFR  3. IBS (irritable bowel  syndrome) - CMP14+EGFR  4. GAD (generalized anxiety disorder) - CMP14+EGFR  5. Annual physical exam - CMP14+EGFR - Lipid panel - Thyroid Panel With TSH - Vit D  25 hydroxy (rtn osteoporosis monitoring) - Pap IG, CT/NG w/ reflex HPV when ASC-U  6. Encounter for routine gynecological examination - CMP14+EGFR - Pap IG, CT/NG w/ reflex HPV when ASC-U   Continue all meds Labs pending Health Maintenance reviewed Diet and exercise encouraged RTO 1 year  Evelina Dun, FNP

## 2014-08-05 LAB — CMP14+EGFR
ALK PHOS: 112 IU/L (ref 39–117)
ALT: 7 IU/L (ref 0–32)
AST: 7 IU/L (ref 0–40)
Albumin/Globulin Ratio: 1.5 (ref 1.1–2.5)
Albumin: 4.1 g/dL (ref 3.5–5.5)
BUN / CREAT RATIO: 13 (ref 8–20)
BUN: 8 mg/dL (ref 6–20)
CO2: 24 mmol/L (ref 18–29)
Calcium: 9 mg/dL (ref 8.7–10.2)
Chloride: 98 mmol/L (ref 97–108)
Creatinine, Ser: 0.64 mg/dL (ref 0.57–1.00)
GFR calc Af Amer: 142 mL/min/{1.73_m2} (ref >59)
GFR calc non Af Amer: 124 mL/min/{1.73_m2} (ref >59)
GLOBULIN, TOTAL: 2.7 g/dL (ref 1.5–4.5)
Glucose: 98 mg/dL (ref 65–99)
Potassium: 4.4 mmol/L (ref 3.5–5.2)
SODIUM: 138 mmol/L (ref 134–144)
Total Protein: 6.8 g/dL (ref 6.0–8.5)

## 2014-08-05 LAB — LIPID PANEL
CHOLESTEROL TOTAL: 189 mg/dL (ref 100–199)
Chol/HDL Ratio: 3.9 ratio units (ref 0.0–4.4)
HDL: 49 mg/dL (ref >39)
LDL Calculated: 127 mg/dL — ABNORMAL HIGH (ref 0–99)
TRIGLYCERIDES: 65 mg/dL (ref 0–149)
VLDL CHOLESTEROL CAL: 13 mg/dL (ref 5–40)

## 2014-08-05 LAB — THYROID PANEL WITH TSH
Free Thyroxine Index: 2 (ref 1.2–4.9)
T3 Uptake Ratio: 25 % (ref 24–39)
T4 TOTAL: 7.8 ug/dL (ref 4.5–12.0)
TSH: 2.27 u[IU]/mL (ref 0.450–4.500)

## 2014-08-05 LAB — VITAMIN D 25 HYDROXY (VIT D DEFICIENCY, FRACTURES): Vit D, 25-Hydroxy: 33.9 ng/mL (ref 30.0–100.0)

## 2014-08-06 LAB — PAP IG, CT-NG, RFX HPV ASCU
Chlamydia, Nuc. Acid Amp: NEGATIVE
GONOCOCCUS BY NUCLEIC ACID AMP: NEGATIVE
PAP Smear Comment: 0

## 2014-08-15 ENCOUNTER — Telehealth: Payer: Self-pay | Admitting: Nutrition

## 2014-08-15 ENCOUNTER — Ambulatory Visit: Payer: Medicaid Other | Admitting: Nutrition

## 2014-08-15 NOTE — Telephone Encounter (Signed)
Called and left message to call to reschedule missed appointment on cell phone and home phone. Jearld Fenton, RDN CDE

## 2014-08-18 ENCOUNTER — Ambulatory Visit: Payer: Medicaid Other | Admitting: Family Medicine

## 2014-08-18 ENCOUNTER — Other Ambulatory Visit: Payer: Self-pay | Admitting: Family Medicine

## 2014-08-18 DIAGNOSIS — F411 Generalized anxiety disorder: Secondary | ICD-10-CM

## 2014-08-18 DIAGNOSIS — K589 Irritable bowel syndrome without diarrhea: Secondary | ICD-10-CM

## 2014-08-18 MED ORDER — DULOXETINE HCL 60 MG PO CPEP: 60.0000 mg | ORAL_CAPSULE | Freq: Every day | ORAL | Status: DC

## 2014-08-18 NOTE — Telephone Encounter (Signed)
rx sent into pharmacy. Patient aware.

## 2014-08-21 ENCOUNTER — Ambulatory Visit: Payer: Medicaid Other | Admitting: Family Medicine

## 2014-08-25 ENCOUNTER — Ambulatory Visit: Payer: Medicaid Other | Admitting: Family Medicine

## 2014-09-10 ENCOUNTER — Encounter: Payer: Self-pay | Admitting: Family Medicine

## 2014-09-10 ENCOUNTER — Ambulatory Visit (INDEPENDENT_AMBULATORY_CARE_PROVIDER_SITE_OTHER): Payer: Medicaid Other | Admitting: Family Medicine

## 2014-09-10 VITALS — BP 112/67 | HR 88 | Temp 98.4°F | Ht 64.0 in | Wt 230.0 lb

## 2014-09-10 DIAGNOSIS — F411 Generalized anxiety disorder: Secondary | ICD-10-CM | POA: Diagnosis not present

## 2014-09-10 DIAGNOSIS — K589 Irritable bowel syndrome without diarrhea: Secondary | ICD-10-CM | POA: Diagnosis not present

## 2014-09-10 NOTE — Progress Notes (Signed)
Subjective:  Patient ID: Brittney Farmer, female    DOB: [birth year]  Age: 29 Farmer.o. MRN: [id number]  CC: GAD and Irritable Bowel Syndrome   HPI [name] presents for patient in for follow-up of her anxiety syndrome and her irritable bowel problems. She has been taking the medication regularly since her last visit here. She states she is doing quite well as long she takes her medication. She's not noticed anxiety the craziness. She is also not having abdominal pain and constipation or diarrhea. However if she misses a dose she truly has significant problems. She missed both the EMTs that and the Cymbalta 3 days ago and before the end of days she noted significant abdominal pain and diarrhea. She is getting stressed out and feeling overwhelmed and even her mom said did you take her medicine because she was noticing the stress and anxiety visible on [name] in her demeanor. She denies having any significant side effects with the medicine. Specifically no excessive nausea. She also denies headaches and swelling etc.  History Luwanda has a past medical history of IBS (irritable bowel syndrome).   She has no past surgical history on file.   Her family history is not on file.She reports that she has never smoked. She does not have any smokeless tobacco history on file. Her alcohol and drug histories are not on file.  Current Outpatient Prescriptions on File Prior to Visit  Medication Sig Dispense Refill  . ALPRAZolam (XANAX) 0.5 MG tablet Take 1 tablet (0.5 mg total) by mouth 2 (two) times daily as needed for anxiety. 60 tablet 3  . dicyclomine (BENTYL) 10 MG capsule Take 1 capsule (10 mg total) by mouth 2 (two) times daily with a meal. 120 capsule 5  . DULoxetine (CYMBALTA) 60 MG capsule Take 1 capsule (60 mg total) by mouth daily. 30 capsule 1  . lubiprostone (AMITIZA) 8 MCG capsule Take 1 capsule (8 mcg total) by mouth 2 (two) times daily with a meal. 60 capsule 2  . omeprazole (PRILOSEC)  40 MG capsule Take 1 capsule (40 mg total) by mouth 2 (two) times daily. 60 capsule 11  . PARAGARD INTRAUTERINE COPPER IUD IUD 1 each by Intrauterine route once.     No current facility-administered medications on file prior to visit.    ROS Review of Systems  Constitutional: Negative for fever, chills, diaphoresis, appetite change, fatigue and unexpected weight change.  HENT: Negative for congestion, ear pain, hearing loss, postnasal drip, rhinorrhea, sneezing, sore throat and trouble swallowing.   Eyes: Negative for pain.  Respiratory: Negative for cough, chest tightness and shortness of breath.   Cardiovascular: Negative for chest pain and palpitations.  Gastrointestinal: Negative for nausea, vomiting, abdominal pain, diarrhea and constipation.  Genitourinary: Negative for dysuria, frequency and menstrual problem.  Musculoskeletal: Negative for joint swelling and arthralgias.  Skin: Negative for rash.  Neurological: Negative for dizziness, weakness, numbness and headaches.  Psychiatric/Behavioral: Negative for dysphoric mood and agitation.    Objective:  BP 112/67 mmHg  Pulse 88  Temp(Src) 98.4 F (36.9 C) (Oral)  Ht 5\' 4"  (1.626 m)  Wt 230 lb (104.327 kg)  BMI 39.46 kg/m2  BP Readings from Last 3 Encounters:  09/10/14 112/67  08/04/14 124/82  07/22/14 110/72    Wt Readings from Last 3 Encounters:  09/10/14 230 lb (104.327 kg)  08/04/14 233 lb 9.6 oz (105.96 kg)  07/22/14 233 lb 6.4 oz (105.87 kg)     Physical Exam  Constitutional: She is oriented to  person, place, and time. She appears well-developed and well-nourished. No distress.  HENT:  Head: Normocephalic and atraumatic.  Right Ear: External ear normal.  Left Ear: External ear normal.  Nose: Nose normal.  Mouth/Throat: Oropharynx is clear and moist.  Eyes: Conjunctivae and EOM are normal. Pupils are equal, round, and reactive to light.  Neck: Normal range of motion. Neck supple. No thyromegaly present.    Cardiovascular: Normal rate, regular rhythm and normal heart sounds.   No murmur heard. Pulmonary/Chest: Effort normal and breath sounds normal. No respiratory distress. She has no wheezes. She has no rales.  Abdominal: Soft. Bowel sounds are normal. She exhibits no distension. There is no tenderness.  Lymphadenopathy:    She has no cervical adenopathy.  Neurological: She is alert and oriented to person, place, and time. She has normal reflexes.  Skin: Skin is warm and dry.  Psychiatric: She has a normal mood and affect. Her behavior is normal. Judgment and thought content normal.    No results found for: HGBA1C  Lab Results  Component Value Date   WBC 6.7 06/06/2014   HGB 10.9* 06/06/2014   HCT 37.5* 06/06/2014   GLUCOSE 98 08/04/2014   CHOL 189 08/04/2014   TRIG 65 08/04/2014   HDL 49 08/04/2014   LDLCALC 127* 08/04/2014   ALT 7 08/04/2014   AST 7 08/04/2014   NA 138 08/04/2014   K 4.4 08/04/2014   CL 98 08/04/2014   CREATININE 0.64 08/04/2014   BUN 8 08/04/2014   CO2 24 08/04/2014   TSH 2.270 08/04/2014    US Abdomen Limited Ruq  02/18/2014   CLINICAL DATA:  Right upper quadrant pain  EXAM: US ABDOMEN LIMITED - RIGHT UPPER QUADRANT  COMPARISON:  None.  FINDINGS: Gallbladder:  No gallstones or wall thickening visualized. Small amount of gallbladder sludge. No sonographic Murphy sign noted.  Common bile duct:  Diameter: 2.9 mm  Liver:  No focal lesion identified. Within normal limits in parenchymal echogenicity.  IMPRESSION: No cholelithiasis or sonographic evidence of acute cholecystitis.   Electronically Signed   By: Kathreen Devoid   On: 02/18/2014 10:07    Assessment & Plan:   Shanigua was seen today for gad and irritable bowel syndrome.  Diagnoses and all orders for this visit:  IBS (irritable bowel syndrome)  GAD (generalized anxiety disorder)   I am having Ms. Dewan maintain her ALPRAZolam, omeprazole, lubiprostone, dicyclomine, PARAGARD INTRAUTERINE COPPER,  and DULoxetine.  No orders of the defined types were placed in this encounter.    All conditions are stable on current medication. She will continue those as is. Additionally we discussed weight loss. She will start a diet diary using on - line applications SUCH as lose it for my fitness pal to record her calories. We discussed weight loss through exercise particularly including some resistance training two-tone muscles as well as aerobic to burn calories and induce metabolism.   Follow-up: Return in about 3 months (around 12/11/2014).  Claretta Fraise, M.D.

## 2014-09-12 ENCOUNTER — Other Ambulatory Visit: Payer: Self-pay | Admitting: *Deleted

## 2014-09-12 ENCOUNTER — Other Ambulatory Visit: Payer: Self-pay | Admitting: Family Medicine

## 2014-09-12 ENCOUNTER — Telehealth: Payer: Self-pay | Admitting: Family Medicine

## 2014-09-12 DIAGNOSIS — K589 Irritable bowel syndrome without diarrhea: Secondary | ICD-10-CM

## 2014-09-12 DIAGNOSIS — F411 Generalized anxiety disorder: Secondary | ICD-10-CM

## 2014-09-12 MED ORDER — DULOXETINE HCL 60 MG PO CPEP: 60.0000 mg | ORAL_CAPSULE | Freq: Every day | ORAL | Status: DC

## 2014-09-12 MED ORDER — LUBIPROSTONE 8 MCG PO CAPS: 8.0000 ug | ORAL_CAPSULE | Freq: Two times a day (BID) | ORAL | Status: DC

## 2014-09-12 MED ORDER — ALPRAZOLAM 0.5 MG PO TABS: 0.5000 mg | ORAL_TABLET | Freq: Two times a day (BID) | ORAL | Status: DC | PRN

## 2014-09-12 NOTE — Progress Notes (Signed)
meds okayed at [facility] per Dr Waylan Rocher into [facility]

## 2014-10-10 ENCOUNTER — Encounter: Payer: Self-pay | Admitting: *Deleted

## 2014-10-31 ENCOUNTER — Telehealth: Payer: Self-pay | Admitting: Family Medicine

## 2014-11-04 ENCOUNTER — Telehealth: Payer: Self-pay | Admitting: Family Medicine

## 2014-11-04 MED ORDER — ESCITALOPRAM OXALATE 10 MG PO TABS: 10.0000 mg | ORAL_TABLET | Freq: Every day | ORAL | Status: DC

## 2014-11-04 NOTE — Telephone Encounter (Signed)
Patient notified

## 2014-11-04 NOTE — Telephone Encounter (Signed)
I sent in a Lexapro prescription. Hopefully it will be more acceptable. Please let the patient know.

## 2014-12-16 ENCOUNTER — Ambulatory Visit (INDEPENDENT_AMBULATORY_CARE_PROVIDER_SITE_OTHER): Payer: 59 | Admitting: Family Medicine

## 2014-12-16 ENCOUNTER — Encounter: Payer: Self-pay | Admitting: Family Medicine

## 2014-12-16 VITALS — BP 117/75 | HR 77 | Temp 97.9°F | Ht 64.0 in | Wt 232.6 lb

## 2014-12-16 DIAGNOSIS — K589 Irritable bowel syndrome without diarrhea: Secondary | ICD-10-CM | POA: Diagnosis not present

## 2014-12-16 DIAGNOSIS — F411 Generalized anxiety disorder: Secondary | ICD-10-CM

## 2014-12-16 DIAGNOSIS — G44219 Episodic tension-type headache, not intractable: Secondary | ICD-10-CM

## 2014-12-16 MED ORDER — OMEPRAZOLE 40 MG PO CPDR: 40.0000 mg | DELAYED_RELEASE_CAPSULE | Freq: Two times a day (BID) | ORAL | Status: DC

## 2014-12-16 MED ORDER — LUBIPROSTONE 8 MCG PO CAPS: 8.0000 ug | ORAL_CAPSULE | Freq: Two times a day (BID) | ORAL | Status: DC

## 2014-12-16 MED ORDER — ALPRAZOLAM 0.5 MG PO TABS: 0.5000 mg | ORAL_TABLET | Freq: Two times a day (BID) | ORAL | Status: DC | PRN

## 2014-12-16 MED ORDER — DICYCLOMINE HCL 10 MG PO CAPS: 10.0000 mg | ORAL_CAPSULE | Freq: Two times a day (BID) | ORAL | Status: DC

## 2014-12-16 MED ORDER — ESCITALOPRAM OXALATE 10 MG PO TABS: 10.0000 mg | ORAL_TABLET | Freq: Every day | ORAL | Status: DC

## 2014-12-16 NOTE — Progress Notes (Signed)
Subjective:  Patient ID: Brittney Farmer, female    DOB: [birth year]  Age: 29 y.o. MRN: [id number]  CC: Irritable Bowel Syndrome and GAD   HPI Brittney Farmer presents for follow-up on her irritable bowel syndrome. She states that her stomach is doing just fine as long she takes her medication. These are also helping with her anxiety. Currently her main concern is her headache. She says that she had headaches 2-3 years ago before getting eyeglasses. After she got the glasses the headaches went away. Unfortunately over 2-3 years time she's noticed that the glasses of gotten scratches etc. and the headaches are starting to come back. The eye exam is due but until recently she couldn't afford by exam she now has insurance to cover it and needs a referral for recheck. HA 20-30 min. headache starts at the temples. 7-/10. Spreads all over the head. Relief with putting her head down and rubbing her temples. Occurring about 6 times a day. Sharp stabbing pain.More in last 2 weeks.  History Dae has a past medical history of IBS (irritable bowel syndrome).   She has no past surgical history on file.   Her family history is not on file.She reports that she has never smoked. She does not have any smokeless tobacco history on file. Her alcohol and drug histories are not on file.  Outpatient Prescriptions Prior to Visit  Medication Sig Dispense Refill  . ALPRAZolam (XANAX) 0.5 MG tablet Take 1 tablet (0.5 mg total) by mouth 2 (two) times daily as needed for anxiety. 60 tablet 2  . dicyclomine (BENTYL) 10 MG capsule Take 1 capsule (10 mg total) by mouth 2 (two) times daily with a meal. 120 capsule 5  . DULoxetine (CYMBALTA) 60 MG capsule Take 1 capsule (60 mg total) by mouth daily. 30 capsule 1  . escitalopram (LEXAPRO) 10 MG tablet Take 1 tablet (10 mg total) by mouth daily. 30 tablet 1  . lubiprostone (AMITIZA) 8 MCG capsule Take 1 capsule (8 mcg total) by mouth 2 (two) times daily with a meal. 60 capsule  2  . omeprazole (PRILOSEC) 40 MG capsule Take 1 capsule (40 mg total) by mouth 2 (two) times daily. 60 capsule 11  . PARAGARD INTRAUTERINE COPPER IUD IUD 1 each by Intrauterine route once.     No facility-administered medications prior to visit.    ROS Review of Systems  Constitutional: Negative for fever, chills, diaphoresis, appetite change, fatigue and unexpected weight change.  HENT: Negative for congestion, ear pain, hearing loss, postnasal drip, rhinorrhea, sneezing, sore throat and trouble swallowing.   Eyes: Negative for pain.  Respiratory: Negative for cough, chest tightness and shortness of breath.   Cardiovascular: Negative for chest pain and palpitations.  Gastrointestinal: Positive for abdominal pain and abdominal distention.  Genitourinary: Negative for dysuria, frequency and menstrual problem.  Musculoskeletal: Negative for joint swelling and arthralgias.  Skin: Negative for rash.  Neurological: Negative for dizziness, weakness, numbness and headaches.  Psychiatric/Behavioral: Negative for dysphoric mood and agitation.    Objective:  BP 117/75 mmHg  Pulse 77  Temp(Src) 97.9 F (36.6 C) (Oral)  Ht 5\' 4"  (1.626 m)  Wt 232 lb 9.6 oz (105.507 kg)  BMI 39.91 kg/m2  LMP 12/05/2014  BP Readings from Last 3 Encounters:  12/16/14 117/75  09/10/14 112/67  08/04/14 124/82    Wt Readings from Last 3 Encounters:  12/16/14 232 lb 9.6 oz (105.507 kg)  09/10/14 230 lb (104.327 kg)  08/04/14 233 lb 9.6 oz (105.96  kg)     Physical Exam  Constitutional: She is oriented to person, place, and time. She appears well-developed and well-nourished. No distress.  HENT:  Head: Normocephalic and atraumatic.  Right Ear: External ear normal.  Left Ear: External ear normal.  Nose: Nose normal.  Mouth/Throat: Oropharynx is clear and moist.  Eyes: Conjunctivae and EOM are normal. Pupils are equal, round, and reactive to light.  Neck: Normal range of motion. Neck supple. No  thyromegaly present.  Cardiovascular: Normal rate, regular rhythm and normal heart sounds.   No murmur heard. Pulmonary/Chest: Effort normal and breath sounds normal. No respiratory distress. She has no wheezes. She has no rales.  Abdominal: Soft. Bowel sounds are normal. She exhibits no distension. There is no tenderness.  Lymphadenopathy:    She has no cervical adenopathy.  Neurological: She is alert and oriented to person, place, and time. She has normal reflexes.  Skin: Skin is warm and dry.  Psychiatric: She has a normal mood and affect. Her behavior is normal. Judgment and thought content normal.    No results found for: HGBA1C  Lab Results  Component Value Date   WBC 6.7 06/06/2014   HGB 10.9* 06/06/2014   HCT 37.5* 06/06/2014   GLUCOSE 98 08/04/2014   CHOL 189 08/04/2014   TRIG 65 08/04/2014   HDL 49 08/04/2014   LDLCALC 127* 08/04/2014   ALT 7 08/04/2014   AST 7 08/04/2014   NA 138 08/04/2014   K 4.4 08/04/2014   CL 98 08/04/2014   CREATININE 0.64 08/04/2014   BUN 8 08/04/2014   CO2 24 08/04/2014   TSH 2.270 08/04/2014    US Abdomen Limited Ruq  02/18/2014   CLINICAL DATA:  Right upper quadrant pain  EXAM: US ABDOMEN LIMITED - RIGHT UPPER QUADRANT  COMPARISON:  None.  FINDINGS: Gallbladder:  No gallstones or wall thickening visualized. Small amount of gallbladder sludge. No sonographic Murphy sign noted.  Common bile duct:  Diameter: 2.9 mm  Liver:  No focal lesion identified. Within normal limits in parenchymal echogenicity.  IMPRESSION: No cholelithiasis or sonographic evidence of acute cholecystitis.   Electronically Signed   By: Kathreen Devoid   On: 02/18/2014 10:07    Assessment & Plan:   Neeley was seen today for irritable bowel syndrome and gad.  Diagnoses and all orders for this visit:  GAD (generalized anxiety disorder) -     DULoxetine (CYMBALTA) 60 MG capsule; Take 1 capsule (60 mg total) by mouth daily. -     ALPRAZolam (XANAX) 0.5 MG tablet; Take 1  tablet (0.5 mg total) by mouth 2 (two) times daily as needed for anxiety.  IBS (irritable bowel syndrome) -     DULoxetine (CYMBALTA) 60 MG capsule; Take 1 capsule (60 mg total) by mouth daily. -     dicyclomine (BENTYL) 10 MG capsule; Take 1 capsule (10 mg total) by mouth 2 (two) times daily with a meal.  Other orders -     omeprazole (PRILOSEC) 40 MG capsule; Take 1 capsule (40 mg total) by mouth 2 (two) times daily. -     lubiprostone (AMITIZA) 8 MCG capsule; Take 1 capsule (8 mcg total) by mouth 2 (two) times daily with a meal. -     escitalopram (LEXAPRO) 10 MG tablet; Take 1 tablet (10 mg total) by mouth daily.   I am having Ms. Dudzinski maintain her omeprazole, dicyclomine, PARAGARD INTRAUTERINE COPPER, ALPRAZolam, DULoxetine, lubiprostone, and escitalopram.  No orders of the defined types were placed  in this encounter.     Follow-up: No Follow-up on file.  Claretta Fraise, M.D.

## 2014-12-17 LAB — CBC WITH DIFFERENTIAL
Basophils Absolute: 0 10*3/uL (ref 0.0–0.2)
Basos: 0 %
EOS (ABSOLUTE): 0.2 10*3/uL (ref 0.0–0.4)
EOS: 2 %
HEMATOCRIT: 35.8 % (ref 34.0–46.6)
Hemoglobin: 11.4 g/dL (ref 11.1–15.9)
IMMATURE GRANULOCYTES: 0 %
Immature Grans (Abs): 0 10*3/uL (ref 0.0–0.1)
LYMPHS ABS: 2.2 10*3/uL (ref 0.7–3.1)
Lymphs: 29 %
MCH: 25 pg — AB (ref 26.6–33.0)
MCHC: 31.8 g/dL (ref 31.5–35.7)
MCV: 79 fL (ref 79–97)
MONOS ABS: 0.5 10*3/uL (ref 0.1–0.9)
Monocytes: 6 %
NEUTROS ABS: 4.9 10*3/uL (ref 1.4–7.0)
Neutrophils: 63 %
RBC: 4.56 x10E6/uL (ref 3.77–5.28)
RDW: 15.2 % (ref 12.3–15.4)
WBC: 7.8 10*3/uL (ref 3.4–10.8)

## 2014-12-17 LAB — CMP14+EGFR
ALT: 12 IU/L (ref 0–32)
AST: 14 IU/L (ref 0–40)
Albumin/Globulin Ratio: 1.7 (ref 1.1–2.5)
Albumin: 4.2 g/dL (ref 3.5–5.5)
Alkaline Phosphatase: 125 IU/L — ABNORMAL HIGH (ref 39–117)
BUN/Creatinine Ratio: 21 — ABNORMAL HIGH (ref 8–20)
BUN: 13 mg/dL (ref 6–20)
Bilirubin Total: 0.2 mg/dL (ref 0.0–1.2)
CALCIUM: 9.3 mg/dL (ref 8.7–10.2)
CHLORIDE: 99 mmol/L (ref 97–108)
CO2: 26 mmol/L (ref 18–29)
CREATININE: 0.63 mg/dL (ref 0.57–1.00)
GFR, EST AFRICAN AMERICAN: 142 mL/min/{1.73_m2} (ref >59)
GFR, EST NON AFRICAN AMERICAN: 123 mL/min/{1.73_m2} (ref >59)
Globulin, Total: 2.5 g/dL (ref 1.5–4.5)
Glucose: 89 mg/dL (ref 65–99)
Potassium: 5.3 mmol/L — ABNORMAL HIGH (ref 3.5–5.2)
Sodium: 139 mmol/L (ref 134–144)
TOTAL PROTEIN: 6.7 g/dL (ref 6.0–8.5)

## 2014-12-17 LAB — PROLACTIN: PROLACTIN: 9.5 ng/mL (ref 4.8–23.3)

## 2014-12-18 ENCOUNTER — Ambulatory Visit: Payer: Medicaid Other | Admitting: Family Medicine

## 2014-12-22 ENCOUNTER — Telehealth: Payer: Self-pay

## 2014-12-22 ENCOUNTER — Telehealth: Payer: Self-pay | Admitting: Family Medicine

## 2014-12-22 ENCOUNTER — Encounter: Payer: Self-pay | Admitting: Pharmacist

## 2014-12-22 ENCOUNTER — Ambulatory Visit (INDEPENDENT_AMBULATORY_CARE_PROVIDER_SITE_OTHER): Payer: 59 | Admitting: Pharmacist

## 2014-12-22 VITALS — BP 120/72 | HR 80 | Ht 64.0 in | Wt 234.0 lb

## 2014-12-22 DIAGNOSIS — R635 Abnormal weight gain: Secondary | ICD-10-CM | POA: Diagnosis not present

## 2014-12-22 DIAGNOSIS — E669 Obesity, unspecified: Secondary | ICD-10-CM | POA: Diagnosis not present

## 2014-12-22 MED ORDER — PHENTERMINE HCL 15 MG PO CAPS: 15.0000 mg | ORAL_CAPSULE | ORAL | Status: DC

## 2014-12-22 MED ORDER — LINACLOTIDE 290 MCG PO CAPS: 290.0000 ug | ORAL_CAPSULE | Freq: Every day | ORAL | Status: DC

## 2014-12-22 MED ORDER — NALTREXONE-BUPROPION HCL ER 8-90 MG PO TB12: ORAL_TABLET | ORAL | Status: DC

## 2014-12-22 NOTE — Telephone Encounter (Signed)
lmovm that Rx was sent to pharmacy

## 2014-12-22 NOTE — Telephone Encounter (Signed)
Linzess ordered. Please notify patient.

## 2014-12-22 NOTE — Telephone Encounter (Signed)
Insurance denied prior authorization for Amitiza  Needs to have failure or contraindication to one over the counter medicine used for the treatment of constipation ,and  intolerance or failure to Linzess

## 2014-12-22 NOTE — Progress Notes (Signed)
Subjective:     Brittney Farmer is a 29 y.o. female who I am asked to see in consultation for evaluation and treatment of obesity. Patient cites health, increased physical ability, self-image as reasons for wanting to lose weight.  Obesity History Weight in late teens: 170 lbs. Period of greatest weight gain: 60lbs during {age at onset: 22) - weight gain started when she first received Depo Provera injection Lowest adult weight: 170# Highest adult weight: 234#   History of Weight Loss Efforts Greatest amount of weight lost: 10 lbs over 2 months Amount of time that loss was maintained: 2 months Circumstances associated with regain of weight: after wedding Successful weight loss techniques attempted: none Unsuccessful weight loss techniques attempted: self-directed dieting  Current Exercise Habits walking  - usually 3 to 4 days per week.  Usually 1 to 1.5 miles.   Current Eating Habits Number of regular meals per day: 3 Number of snacking episodes per day: 2 Who shops for food? patient Who prepares food? patient Who eats with patient? patient, son, daughter and boyfriend Binge behavior?: yes - occurs 3 or 4 days per week Purge behavior? no Anorexic behavior? no Eating precipitated by stress? yes -   Guilt feelings associated with eating? yes -    Other Potential Contributing Factors Use of alcohol: average 0 drinks/week Use of medications that may cause weight gain none currently History of past abuse? none Psych History: ADHD, depression, obesity and school difficulties Comorbidities: GERD The following portions of the patient's history were reviewed and updated as appropriate: allergies, current medications, past family history, past medical history, past social history, past surgical history and problem list.  Review of Systems Constitutional: positive for none, negative for anorexia, chills, fatigue, fevers, malaise, night sweats, sweats and weight loss Respiratory: positive  for none, negative for asthma, chronic bronchitis and cough Gastrointestinal: positive for abdominal pain, constipation and diarrhea, negative for change in bowel habits, dysphagia, nausea and vomiting Musculoskeletal:positive for stiff joints, negative for arthralgias, myalgias and neck pain Neurological: positive for headaches, negative for coordination problems, dizziness, memory problems, paresthesia, seizures, speech problems, tremors, vertigo and weakness Behavioral/Psych: positive for anxiety, depression and fatigue, negative for aggressive behavior, behavior problems and phobia  Endocrine: positive for none, negative for fertility problems and diabetic symptoms     Objective:    BP 120/72 mmHg  Pulse 80  Ht 5\' 4"  (1.626 m)  Wt 234 lb (106.142 kg)  BMI 40.15 kg/m2  LMP 12/05/2014 Body mass index is 40.15 kg/(m^2). General:  alert, cooperative, appears stated age, no distress and moderately obese   Lab Review TSH = WNL (08/2014) BMET = WNL except slightly elevated potassium / normal serum creatinine (12/16/2014) CBC = WNL Prolactin = WNL   Assessment:    Obesity with BMI and comorbidities as noted above. Signs of hypothyroidism: none Signs of hypercortisolism: none Contraindications to weight loss: none Patient readiness to commit to diet and activity changes: good Barriers to weight loss: time limitations     Plan:    1. Diagnostic studies to rule out secondary causes of obesity: none 2. General patient education ('Yes' if discussed, 'No' if not) Average sustained weight loss in long-term studies w/lifestyle interventions alone is 10-15lb: yes Importance of long-term maintenance tx in weight loss: yes Use non-food self-rewards to reinforce behavior changes: yes Elicit support from others; identify saboteurs: yes Practical target weight is 10% of weight to start.  Current target weight is 210 lbs. for this patient. Pt's desired target  weight is 200: yes 3. Diet  interventions: diet diary indefinitely, moderate (500 kCal/d) deficit diet and qualitative changes (increase low-fat,  high-fiber foods) Proper food choices reviewed: yes.  Discussed 50% of diet should be from fruit and begetables; 25% from protein and disussed lean protein sources; 25% from starch / starchy vegetables. Preparation techniques reviewed: yes Careful meal planning; avoiding ad hoc eating: yes Stimulus control to control unhealthy eating: yes Handouts given: patient given food lists handout, 50 tips for healthy eating reviewed and given  4. Exercise intervention:  Informal measures, e.g. taking stairs instead of elevator: yes Formal exercise regimen: yes - increase exercise to 5 to 7 days per week.  Also gave patient some chair type exercises to do for core strengthening that are not crunches since she is unable to do crunches.  5. Other behavioral treatment: stress management 6. Other treatment: Medication: Rx for Contrave 1 tablet qam for week 1; 1 tablet bid for week 2; 2 tabs in am and 1 in pm for week 3 and 2 tablets bid thereafter    Also advised patient to try to taper off escitalopram as she is tapering up on Contrave.  7. Patient to keep a weight log that we will review at follow up. 8. Follow up: 4 weeks and as needed.    Cherre Robins, PharmD, CPP

## 2014-12-22 NOTE — Patient Instructions (Signed)
When starting Contrave - try to decrease lexapro / escitalopram 10mg   Week 1: take 1 tablet daily Week 2 and 3: take 1/2 tablet daily Week 4: try to stop lexapro or escitalopram

## 2014-12-22 NOTE — Telephone Encounter (Signed)
Rx changed to phentermine 15mg  take 1 capsule each morning.

## 2014-12-23 ENCOUNTER — Telehealth: Payer: Self-pay

## 2014-12-23 NOTE — Telephone Encounter (Signed)
Insurance approved Linzess through 06/25/15

## 2015-01-01 ENCOUNTER — Other Ambulatory Visit: Payer: Self-pay | Admitting: Family Medicine

## 2015-01-01 MED ORDER — PHENTERMINE HCL 15 MG PO TBDP: 1.0000 | ORAL_TABLET | Freq: Every day | ORAL | Status: DC

## 2015-01-01 NOTE — Telephone Encounter (Signed)
Phentermine was called in 12/22/14 - not due to be refilled.  Also Dr Livia Snellen sent in escitalopram 12/16/2014.  Please check with [facility] to see if they received.

## 2015-01-01 NOTE — Telephone Encounter (Signed)
Spoke with [facility] - escitalopram is filled and waiting for pick up.  Phentermine was put on hold and they are filling now. Called patient.  She states that [facility] pharmacist told her that phentermine  15mg  needed for be changed to tablets instead of capsule - I don't see any request for this but I will send in to pharmacy.

## 2015-01-26 ENCOUNTER — Encounter: Payer: Self-pay | Admitting: Pharmacist

## 2015-02-02 ENCOUNTER — Ambulatory Visit (INDEPENDENT_AMBULATORY_CARE_PROVIDER_SITE_OTHER): Payer: 59 | Admitting: Pharmacist

## 2015-02-02 ENCOUNTER — Encounter: Payer: Self-pay | Admitting: Pharmacist

## 2015-02-02 VITALS — BP 120/74 | HR 78 | Ht 64.0 in | Wt 227.0 lb

## 2015-02-02 DIAGNOSIS — E669 Obesity, unspecified: Secondary | ICD-10-CM | POA: Diagnosis not present

## 2015-02-02 MED ORDER — PHENTERMINE HCL 15 MG PO TBDP: 1.0000 | ORAL_TABLET | Freq: Every day | ORAL | Status: DC

## 2015-02-02 NOTE — Progress Notes (Signed)
Subjective:     Brittney Farmer is a 29 y.o. female who is here today to follow up treatment - dietary, exercise and medication - of obesity. Patient cites health, increased physical ability, self-image as reasons for wanting to lose weight. About 6 weeks ago started phentermine 15mg  once daily.  Had tried Contrave but this was not covered by insurance.  Obesity History Weight in late teens: 170 lbs. Period of greatest weight gain: 60lbs weight gain started when she first received Depo Provera injection as a teenager. Lowest adult weight: 170# Highest adult weight: 234#   History of Weight Loss Efforts Greatest amount of weight lost: 10 lbs over 2 months Amount of time that loss was maintained: 2 months Circumstances associated with regain of weight: after wedding Successful weight loss techniques attempted: none Unsuccessful weight loss techniques attempted: self-directed dieting  Current Exercise Habits walking  - but has decreased lately due to increase in family activity to 1-2 days per week.  Usually 1 to 1.5 miles.   Current Eating Habits Number of regular meals per day: 2 Number of snacking episodes per day: 2 Who shops for food? patient Who prepares food? patient Who eats with patient? patient, son, daughter and boyfriend Binge behavior?: yes - occurs 1 or 2 days per week but this has improved since starting phentermine  Purge behavior? no Anorexic behavior? no Eating precipitated by stress? yes -  Patient reports that she is having less anxiety and depression over last 4 to 6 weeks  Guilt feelings associated with eating? yes -  But greatly improved  Other Potential Contributing Factors Use of alcohol: average 0 drinks/week Use of medications that may cause weight gain none currently History of past abuse? none Psych History: ADHD, depression, obesity and school difficulties Comorbidities: GERD The following portions of the patient's history were reviewed and updated as  appropriate: allergies, current medications, past family history, past medical history, past social history, past surgical history and problem list.  Review of Systems Constitutional: positive for none, negative for anorexia, chills, fatigue, fevers, malaise, night sweats, sweats and weight loss Respiratory: positive for none, negative for asthma, chronic bronchitis and cough Gastrointestinal: positive for abdominal pain, constipation and diarrhea, negative for change in bowel habits, dysphagia, nausea and vomiting Musculoskeletal:positive for stiff joints, negative for arthralgias, myalgias and neck pain Neurological: positive for headaches, negative for coordination problems, dizziness, memory problems, paresthesia, seizures, speech problems, tremors, vertigo and weakness Behavioral/Psych: positive for anxiety and depression , negative for aggressive behavior, behavior problems, fatigue and phobia  Endocrine: positive for none, negative for fertility problems and diabetic symptoms     Objective:    BP 120/74 mmHg  Pulse 78  Ht 5\' 4"  (1.626 m)  Wt 227 lb (102.967 kg)  BMI 38.95 kg/m2 Body mass index is 38.95 kg/(m^2).    General:  alert, cooperative, appears stated age, no distress and moderately obese   Lab Review TSH = WNL (08/2014) BMET = WNL except slightly elevated potassium / normal serum creatinine (12/16/2014) CBC = WNL Prolactin = WNL   Assessment:    Obesity with BMI and comorbidities as noted above. Signs of hypothyroidism: none Signs of hypercortisolism: none Contraindications to weight loss: none Patient readiness to commit to diet and activity changes: good Barriers to weight loss: time limitations  Medication management - patient has not start linzess - she was unaware it was called in place of [facility]     Plan:    1. Diagnostic studies to rule out  secondary causes of obesity: none  2. Diet interventions: diet diary indefinitely, moderate (500 kCal/d)  deficit diet and qualitative changes (increase low-fat,  high-fiber foods) Proper food choices reviewed: yes. Patient was given handout with health breakfast, lunch and dinner ideas with recipes.   Preparation techniques reviewed: yes Stimulus control to control unhealthy eating: yes 3. Exercise intervention:  Informal measures, e.g. taking stairs instead of elevator: yes Formal exercise regimen: yes -encouraged to increase exercise back to at least 4 days per week with a goal of 5 to 7 days per week.   4. Other behavioral treatment: stress management 5. Other treatment:  Continue phentermine 15mg  1 capsules daily - will continue through November / beginning of December and then take break for 8 to 12 weeks. 6. Follow up: with PCP November (patient has appt for October but she states that Dr Livia Snellen wanted to recheck HA's and only after she has seen optomotrist and had new glasses.  Patient anticipates that she will not receive new glasses for another 2 weeks.  7.  Patient to pick up linzess at pharmacy and start.  Follow up with me in December.   Cherre Robins, PharmD, CPP

## 2015-02-03 ENCOUNTER — Telehealth: Payer: Self-pay | Admitting: Family Medicine

## 2015-02-03 NOTE — Telephone Encounter (Signed)
Patient aware that the Rx for phentermine is the same as it was 1 month ago.  Patient verbalized understanding and would call the pharmacy and see what is going on

## 2015-02-16 ENCOUNTER — Ambulatory Visit: Payer: 59 | Admitting: Family Medicine

## 2015-03-06 ENCOUNTER — Telehealth: Payer: Self-pay | Admitting: Pharmacist

## 2015-03-06 ENCOUNTER — Other Ambulatory Visit: Payer: Self-pay | Admitting: Family Medicine

## 2015-03-06 NOTE — Telephone Encounter (Signed)
Last seen 10/3

## 2015-03-09 ENCOUNTER — Encounter: Payer: Self-pay | Admitting: Pediatrics

## 2015-03-09 ENCOUNTER — Ambulatory Visit: Payer: Self-pay | Admitting: Family Medicine

## 2015-03-09 ENCOUNTER — Ambulatory Visit (INDEPENDENT_AMBULATORY_CARE_PROVIDER_SITE_OTHER): Payer: 59 | Admitting: Pediatrics

## 2015-03-09 ENCOUNTER — Other Ambulatory Visit: Payer: Self-pay | Admitting: *Deleted

## 2015-03-09 VITALS — BP 124/75 | HR 91 | Temp 97.4°F | Ht 64.0 in | Wt 228.2 lb

## 2015-03-09 DIAGNOSIS — F329 Major depressive disorder, single episode, unspecified: Secondary | ICD-10-CM | POA: Diagnosis not present

## 2015-03-09 DIAGNOSIS — Z309 Encounter for contraceptive management, unspecified: Secondary | ICD-10-CM

## 2015-03-09 DIAGNOSIS — E669 Obesity, unspecified: Secondary | ICD-10-CM

## 2015-03-09 DIAGNOSIS — F32A Depression, unspecified: Secondary | ICD-10-CM

## 2015-03-09 DIAGNOSIS — F411 Generalized anxiety disorder: Secondary | ICD-10-CM

## 2015-03-09 DIAGNOSIS — K589 Irritable bowel syndrome without diarrhea: Secondary | ICD-10-CM

## 2015-03-09 DIAGNOSIS — Z3009 Encounter for other general counseling and advice on contraception: Secondary | ICD-10-CM

## 2015-03-09 MED ORDER — PHENTERMINE HCL 30 MG PO TBDP: 1.0000 | ORAL_TABLET | ORAL | Status: DC

## 2015-03-09 MED ORDER — ALPRAZOLAM 0.5 MG PO TABS: 0.5000 mg | ORAL_TABLET | Freq: Two times a day (BID) | ORAL | Status: DC | PRN

## 2015-03-09 MED ORDER — PHENTERMINE HCL 37.5 MG PO TABS: 37.5000 mg | ORAL_TABLET | Freq: Every day | ORAL | Status: DC

## 2015-03-09 NOTE — Telephone Encounter (Signed)
rx called to pharmacy 

## 2015-03-09 NOTE — Progress Notes (Signed)
Subjective:    Patient ID: Brittney Farmer, female    DOB: 02/27/1986, 29 y.o.   MRN: [id number]  CC: IUD removal and multiple med problem f/u  HPI: Brittney Farmer is a 29 y.o. female presenting on 03/09/2015 for IUD Removal  Regular periods Sometimes has spotting and cramping Has had copper IUD in place for past 4 years Interested in getting pregnant, would like to have IUD removed. Has been on phentermine for a couple of months, at first was helping with weight, now she feels has been less so. Is OK with stopping phentermine given she wants to get pregnant. Depression is well controlled. Trying to wean self off of xanax. Taking once a day apprx. Increased stress from close acquaintance commiting suicide last week. IBS: symptoms controlled, come and go at times. Takes bentyl twice a day. Never started linzess because of cost.  Relevant past medical, surgical, family and social history reviewed and updated as indicated. Interim medical history since our last visit reviewed. Allergies and medications reviewed and updated.   ROS: Per HPI unless specifically indicated above  Past Medical History Patient Active Problem List   Diagnosis Date Noted  . Obesity 12/22/2014  . Depression 08/04/2014  . Abdominal pain, chronic, right upper quadrant 06/06/2014  . GAD (generalized anxiety disorder) 06/06/2014  . Gastroesophageal reflux disease with esophagitis 06/06/2014  . IBS (irritable bowel syndrome) 06/06/2014    Current Outpatient Prescriptions  Medication Sig Dispense Refill  . ALPRAZolam (XANAX) 0.5 MG tablet Take 1 tablet (0.5 mg total) by mouth 2 (two) times daily as needed for anxiety. 60 tablet 0  . dicyclomine (BENTYL) 10 MG capsule Take 1 capsule (10 mg total) by mouth 2 (two) times daily with a meal. 120 capsule 5  . escitalopram (LEXAPRO) 10 MG tablet Take 1 tablet (10 mg total) by mouth daily. 90 tablet 1  . Linaclotide (LINZESS) 290 MCG CAPS capsule Take 1 capsule (290  mcg total) by mouth daily. (Patient not taking: Reported on 02/02/2015) 30 capsule 2  . omeprazole (PRILOSEC) 40 MG capsule Take 1 capsule (40 mg total) by mouth 2 (two) times daily. 180 capsule 4  . PARAGARD INTRAUTERINE COPPER IUD IUD 1 each by Intrauterine route once.    . phentermine (ADIPEX-P) 37.5 MG tablet Take 1 tablet (37.5 mg total) by mouth daily before breakfast. 30 tablet 1   No current facility-administered medications for this visit.       Objective:    BP 124/75 mmHg  Pulse 91  Temp(Src) 97.4 F (36.3 C) (Oral)  Ht 5\' 4"  (1.626 m)  Wt 228 lb 3.2 oz (103.511 kg)  BMI 39.15 kg/m2  Wt Readings from Last 3 Encounters:  03/09/15 228 lb 3.2 oz (103.511 kg)  02/02/15 227 lb (102.967 kg)  12/22/14 234 lb (106.142 kg)    Gen: NAD, alert, cooperative with exam, NCAT EYES: EOMI, no scleral injection or icterus ENT:  TMs pearly gray b/l, OP without erythema LYMPH: no cervical LAD CV: NRRR, normal S1/S2, no murmur, distal pulses 2+ b/l Resp: CTABL, no wheezes, normal WOB Abd: +BS, soft, NTND. no guarding or organomegaly Ext: No edema, warm Neuro: Alert and oriented, strength equal b/l UE and LE, coordination grossly normal MSK: normal muscle bulk GU: normal external female genitalia, cervix with two IUD strings easily visible     Assessment & Plan:    Brittney Farmer was seen today for iud removal and multiple med problem f/u. Pt wants to get pregnant. Went over her  medication list, recommended she talk with her OB about the meds she will be continuing.  Diagnoses and all orders for this visit:  Obesity Stop phentermine with plans to get pregnant. Continue lifestyle changes, daily exercise.  GAD (generalized anxiety disorder) Continue lexapro. Discussed there is a chance this medication can be teratogenic but that the risks of untreated depression are at times more than the risk of the medicine to growing fetus. Encouraged her to talk with OB prior to pregnancy. Strongly  recommended weaning off of xanax. Pt says she is currently taking it just once a day. Will continue to decrease.  Depression Symptoms well controlled now, needs to stop xanax due to upcoming pregnancy. Will discuss continued lexapro use with her OB as above.  IBS (irritable bowel syndrome) Never on linzess for IBS, needs to disucss bentyl with OB.  Birth control counseling Pt requesting removal of IUD. See separate procedure note. Now that it is out, discussed she will need to use barrier protection if she does not want to become pregnant immediately. Pt voiced understanding.  PROCEDURE NOTE IUD REMOVAL: A speculum was placed. The Paragard was removed in entirety using ring forceps. The intact device was shown to the patient and then appropriately disposed of.   Follow up plan: As needed  Brittney Found, MD [facility] 03/09/2015, 5:30 PM

## 2015-03-09 NOTE — Telephone Encounter (Signed)
Patient last seen in office on 02-02-15. Rx last filled on 01-26-15 fir #[id number]. Please advise. If approved please route to Pool B so nurse can phone in to [facility] in [place]

## 2015-03-09 NOTE — Telephone Encounter (Signed)
Patient states that her weight loss has slowed.  Will increase to phentermine 30mg  qam.  Will continue for 1 more month and then take a break.

## 2015-03-10 MED ORDER — ALPRAZOLAM 0.5 MG PO TABS: 0.5000 mg | ORAL_TABLET | Freq: Every day | ORAL | Status: DC | PRN

## 2015-03-10 MED ORDER — OMEPRAZOLE 40 MG PO CPDR: 40.0000 mg | DELAYED_RELEASE_CAPSULE | Freq: Every day | ORAL | Status: DC

## 2015-03-11 ENCOUNTER — Ambulatory Visit: Payer: Self-pay | Admitting: Family Medicine

## 2015-04-14 ENCOUNTER — Encounter: Payer: Self-pay | Admitting: Family

## 2015-04-14 ENCOUNTER — Ambulatory Visit (INDEPENDENT_AMBULATORY_CARE_PROVIDER_SITE_OTHER): Payer: 59 | Admitting: Family

## 2015-04-14 ENCOUNTER — Encounter (INDEPENDENT_AMBULATORY_CARE_PROVIDER_SITE_OTHER): Payer: Self-pay

## 2015-04-14 VITALS — BP 126/81 | HR 108 | Temp 97.9°F | Ht 64.0 in | Wt 233.0 lb

## 2015-04-14 DIAGNOSIS — J029 Acute pharyngitis, unspecified: Secondary | ICD-10-CM

## 2015-04-14 DIAGNOSIS — J101 Influenza due to other identified influenza virus with other respiratory manifestations: Secondary | ICD-10-CM

## 2015-04-14 LAB — POCT INFLUENZA A/B
INFLUENZA B, POC: NEGATIVE
Influenza A, POC: POSITIVE — AB

## 2015-04-14 LAB — POCT RAPID STREP A (OFFICE): Rapid Strep A Screen: NEGATIVE

## 2015-04-14 MED ORDER — OSELTAMIVIR PHOSPHATE 75 MG PO CAPS: 75.0000 mg | ORAL_CAPSULE | Freq: Two times a day (BID) | ORAL | Status: DC

## 2015-04-14 NOTE — Patient Instructions (Signed)

## 2015-04-14 NOTE — Progress Notes (Signed)
   Subjective:    Patient ID: Brittney Farmer, female    DOB: 1985/10/13, 29 y.o.   MRN: [id number]  Cough Associated symptoms include a fever, headaches and shortness of breath. Pertinent negatives include no ear pain.  Sore Throat  This is a new problem. The current episode started in the past 7 days. The problem has been waxing and waning. The maximum temperature recorded prior to her arrival was 101 - 101.9 F. Associated symptoms include congestion, coughing, headaches, a hoarse voice, shortness of breath, swollen glands and trouble swallowing. Pertinent negatives include no ear discharge, ear pain, plugged ear sensation, neck pain or vomiting. She has had no exposure to strep or mono. She has tried acetaminophen and NSAIDs for the symptoms. The treatment provided mild relief.  Headache  Associated symptoms include coughing, a fever and swollen glands. Pertinent negatives include no ear pain, neck pain or vomiting.  Fever  Associated symptoms include congestion, coughing and headaches. Pertinent negatives include no ear pain or vomiting.      Review of Systems  Constitutional: Positive for fever.  HENT: Positive for congestion, hoarse voice and trouble swallowing. Negative for ear discharge and ear pain.   Eyes: Negative.   Respiratory: Positive for cough and shortness of breath.   Cardiovascular: Negative.  Negative for palpitations.  Gastrointestinal: Negative.  Negative for vomiting.  Endocrine: Negative.   Genitourinary: Negative.   Musculoskeletal: Negative.  Negative for neck pain.  Neurological: Positive for headaches.  Hematological: Negative.   Psychiatric/Behavioral: Negative.   All other systems reviewed and are negative.      Objective:   Physical Exam  Constitutional: She is oriented to person, place, and time. She appears well-developed and well-nourished. No distress.  HENT:  Head: Normocephalic and atraumatic.  Right Ear: External ear normal.  Left Ear:  External ear normal.  Nasal passage erythemas with mild swelling  Oropharynx erythemas  Eyes: Pupils are equal, round, and reactive to light.  Neck: Normal range of motion. Neck supple. No thyromegaly present.  Cardiovascular: Normal rate, regular rhythm, normal heart sounds and intact distal pulses.   No murmur heard. Pulmonary/Chest: Effort normal and breath sounds normal. No respiratory distress. She has no wheezes.  Abdominal: Soft. Bowel sounds are normal. She exhibits no distension. There is no tenderness.  Musculoskeletal: Normal range of motion. She exhibits no edema or tenderness.  Neurological: She is alert and oriented to person, place, and time. She has normal reflexes. No cranial nerve deficit.  Skin: Skin is warm and dry.  Psychiatric: She has a normal mood and affect. Her behavior is normal. Judgment and thought content normal.  Vitals reviewed.   BP 126/81 mmHg  Pulse 108  Temp(Src) 97.9 F (36.6 C) (Oral)  Ht 5\' 4"  (1.626 m)  Wt 233 lb (105.688 kg)  BMI 39.97 kg/m2       Assessment & Plan:  1. Sore throat - POCT rapid strep A - POCT Influenza A/B  2. Influenza A -Rest -Force fluids -Tylenol prn for fever -Bland diet -RTO prn - oseltamivir (TAMIFLU) 75 MG capsule; Take 1 capsule (75 mg total) by mouth 2 (two) times daily.  Dispense: 10 capsule; Refill: 0  Evelina Dun, FNP

## 2015-04-20 ENCOUNTER — Ambulatory Visit: Payer: Self-pay | Admitting: Pharmacist

## 2015-05-07 ENCOUNTER — Other Ambulatory Visit: Payer: Self-pay | Admitting: *Deleted

## 2015-05-07 DIAGNOSIS — F411 Generalized anxiety disorder: Secondary | ICD-10-CM

## 2015-05-07 NOTE — Telephone Encounter (Signed)
Last seen and filled 03/09/15. Route to pool, nurse call in at [facility]

## 2015-05-07 NOTE — Telephone Encounter (Signed)
x

## 2015-05-07 NOTE — Telephone Encounter (Signed)
Forwarding to Dr Livia Snellen, pt of his. I saw her for IUD removal 03/09/2015, strongly recommended she stop both phentermine and xanax at that time as she was interested in getting pregnant, she wanted IUD out, didn't want to use other forms of birth control. I changed her med list that day to reflect xanax once a day dosing which pt said she was taking not twice a day, didn't send in any refills then.

## 2015-05-12 ENCOUNTER — Other Ambulatory Visit: Payer: Self-pay

## 2015-05-12 DIAGNOSIS — F411 Generalized anxiety disorder: Secondary | ICD-10-CM

## 2015-05-12 MED ORDER — ALPRAZOLAM 0.5 MG PO TABS: 0.5000 mg | ORAL_TABLET | Freq: Every evening | ORAL | Status: DC | PRN

## 2015-05-12 NOTE — Telephone Encounter (Signed)
Last seen 04/04/15  Brittney Farmer  If approved route to nurse to call into [facility]

## 2015-07-10 ENCOUNTER — Other Ambulatory Visit: Payer: Self-pay | Admitting: Family Medicine

## 2015-07-10 ENCOUNTER — Telehealth: Payer: Self-pay | Admitting: Family Medicine

## 2015-07-10 MED ORDER — ESCITALOPRAM OXALATE 10 MG PO TABS: 10.0000 mg | ORAL_TABLET | Freq: Every day | ORAL | Status: DC

## 2015-07-10 NOTE — Telephone Encounter (Signed)
Pt aware - sent in  

## 2015-07-27 ENCOUNTER — Encounter: Payer: Self-pay | Admitting: Family Medicine

## 2015-07-27 ENCOUNTER — Ambulatory Visit (INDEPENDENT_AMBULATORY_CARE_PROVIDER_SITE_OTHER): Payer: BLUE CROSS/BLUE SHIELD | Admitting: Family Medicine

## 2015-07-27 VITALS — BP 111/65 | HR 102 | Temp 96.8°F | Ht 64.0 in | Wt 228.0 lb

## 2015-07-27 DIAGNOSIS — F411 Generalized anxiety disorder: Secondary | ICD-10-CM | POA: Diagnosis not present

## 2015-07-27 DIAGNOSIS — K589 Irritable bowel syndrome without diarrhea: Secondary | ICD-10-CM

## 2015-07-27 DIAGNOSIS — K2100 Gastro-esophageal reflux disease with esophagitis, without bleeding: Secondary | ICD-10-CM

## 2015-07-27 DIAGNOSIS — F329 Major depressive disorder, single episode, unspecified: Secondary | ICD-10-CM

## 2015-07-27 DIAGNOSIS — F32A Depression, unspecified: Secondary | ICD-10-CM

## 2015-07-27 DIAGNOSIS — E669 Obesity, unspecified: Secondary | ICD-10-CM

## 2015-07-27 MED ORDER — ALPRAZOLAM 0.5 MG PO TABS: 0.5000 mg | ORAL_TABLET | Freq: Two times a day (BID) | ORAL | Status: DC

## 2015-07-27 MED ORDER — DICYCLOMINE HCL 10 MG PO CAPS: 10.0000 mg | ORAL_CAPSULE | Freq: Two times a day (BID) | ORAL | Status: DC

## 2015-07-27 MED ORDER — OMEPRAZOLE 40 MG PO CPDR: 40.0000 mg | DELAYED_RELEASE_CAPSULE | Freq: Every day | ORAL | Status: DC

## 2015-07-27 MED ORDER — ESCITALOPRAM OXALATE 10 MG PO TABS: 10.0000 mg | ORAL_TABLET | Freq: Every day | ORAL | Status: DC

## 2015-07-27 NOTE — Patient Instructions (Signed)
While you are doing well is a great time to revitalize your weight loss efforts!   Calorie Counting for Weight Loss Calories are energy you get from the things you eat and drink. Your body uses this energy to keep you going throughout the day. The number of calories you eat affects your weight. When you eat more calories than your body needs, your body stores the extra calories as fat. When you eat fewer calories than your body needs, your body burns fat to get the energy it needs. Calorie counting means keeping track of how many calories you eat and drink each day. If you make sure to eat fewer calories than your body needs, you should lose weight. In order for calorie counting to work, you will need to eat the number of calories that are right for you in a day to lose a healthy amount of weight per week. A healthy amount of weight to lose per week is usually 1-2 lb (0.5-0.9 kg). A dietitian can determine how many calories you need in a day and give you suggestions on how to reach your calorie goal.  WHAT IS MY MY PLAN? My goal is to have ____1200______ calories per day.  If I have this many calories per day, I should lose around ______1-2____ pounds per week. WHAT DO I NEED TO KNOW ABOUT CALORIE COUNTING? In order to meet your daily calorie goal, you will need to:  Find out how many calories are in each food you would like to eat. Try to do this before you eat.  Decide how much of the food you can eat.  Write down what you ate and how many calories it had. Doing this is called keeping a food log. WHERE DO I FIND CALORIE INFORMATION? The number of calories in a food can be found on a Nutrition Facts label. Note that all the information on a label is based on a specific serving of the food. If a food does not have a Nutrition Facts label, try to look up the calories online or ask your dietitian for help. HOW DO I DECIDE HOW MUCH TO EAT? To decide how much of the food you can eat, you will need to  consider both the number of calories in one serving and the size of one serving. This information can be found on the Nutrition Facts label. If a food does not have a Nutrition Facts label, look up the information online or ask your dietitian for help. Remember that calories are listed per serving. If you choose to have more than one serving of a food, you will have to multiply the calories per serving by the amount of servings you plan to eat. For example, the label on a package of bread might say that a serving size is 1 slice and that there are 90 calories in a serving. If you eat 1 slice, you will have eaten 90 calories. If you eat 2 slices, you will have eaten 180 calories. HOW DO I KEEP A FOOD LOG? After each meal, record the following information in your food log:  What you ate.  How much of it you ate.  How many calories it had.  Then, add up your calories. Keep your food log near you, such as in a small notebook in your pocket. Another option is to use a mobile app or website. Some programs will calculate calories for you and show you how many calories you have left each time you add  an item to the log. WHAT ARE SOME CALORIE COUNTING TIPS?  Use your calories on foods and drinks that will fill you up and not leave you hungry. Some examples of this include foods like nuts and nut butters, vegetables, lean proteins, and high-fiber foods (more than 5 g fiber per serving).  Eat nutritious foods and avoid empty calories. Empty calories are calories you get from foods or beverages that do not have many nutrients, such as candy and soda. It is better to have a nutritious high-calorie food (such as an avocado) than a food with few nutrients (such as a bag of chips).  Know how many calories are in the foods you eat most often. This way, you do not have to look up how many calories they have each time you eat them.  Look out for foods that may seem like low-calorie foods but are really  high-calorie foods, such as baked goods, soda, and fat-free candy.  Pay attention to calories in drinks. Drinks such as sodas, specialty coffee drinks, alcohol, and juices have a lot of calories yet do not fill you up. Choose low-calorie drinks like water and diet drinks.  Focus your calorie counting efforts on higher calorie items. Logging the calories in a garden salad that contains only vegetables is less important than calculating the calories in a milk shake.  Find a way of tracking calories that works for you. Get creative. Most people who are successful find ways to keep track of how much they eat in a day, even if they do not count every calorie. WHAT ARE SOME PORTION CONTROL TIPS?  Know how many calories are in a serving. This will help you know how many servings of a certain food you can have.  Use a measuring cup to measure serving sizes. This is helpful when you start out. With time, you will be able to estimate serving sizes for some foods.  Take some time to put servings of different foods on your favorite plates, bowls, and cups so you know what a serving looks like.  Try not to eat straight from a bag or box. Doing this can lead to overeating. Put the amount you would like to eat in a cup or on a plate to make sure you are eating the right portion.  Use smaller plates, glasses, and bowls to prevent overeating. This is a quick and easy way to practice portion control. If your plate is smaller, less food can fit on it.  Try not to multitask while eating, such as watching TV or using your computer. If it is time to eat, sit down at a table and enjoy your food. Doing this will help you to start recognizing when you are full. It will also make you more aware of what and how much you are eating. HOW CAN I CALORIE COUNT WHEN EATING OUT?  Ask for smaller portion sizes or child-sized portions.  Consider sharing an entree and sides instead of getting your own entree.  If you get your  own entree, eat only half. Ask for a box at the beginning of your meal and put the rest of your entree in it so you are not tempted to eat it.  Look for the calories on the menu. If calories are listed, choose the lower calorie options.  Choose dishes that include vegetables, fruits, whole grains, low-fat dairy products, and lean protein. Focusing on smart food choices from each of the 5 food groups can help you  stay on track at restaurants.  Choose items that are boiled, broiled, grilled, or steamed.  Choose water, milk, unsweetened iced tea, or other drinks without added sugars. If you want an alcoholic beverage, choose a lower calorie option. For example, a regular margarita can have up to 700 calories and a glass of wine has around 150.  Stay away from items that are buttered, battered, fried, or served with cream sauce. Items labeled "crispy" are usually fried, unless stated otherwise.  Ask for dressings, sauces, and syrups on the side. These are usually very high in calories, so do not eat much of them.  Watch out for salads. Many people think salads are a healthy option, but this is often not the case. Many salads come with bacon, fried chicken, lots of cheese, fried chips, and dressing. All of these items have a lot of calories. If you want a salad, choose a garden salad and ask for grilled meats or steak. Ask for the dressing on the side, or ask for olive oil and vinegar or lemon to use as dressing.  Estimate how many servings of a food you are given. For example, a serving of cooked rice is  cup or about the size of half a tennis ball or one cupcake wrapper. Knowing serving sizes will help you be aware of how much food you are eating at restaurants. The list below tells you how big or small some common portion sizes are based on everyday objects.  1 oz--4 stacked dice.  3 oz--1 deck of cards.  1 tsp--1 dice.  1 Tbsp-- a Ping-Pong ball.  2 Tbsp--1 Ping-Pong ball.   cup--1  tennis ball or 1 cupcake wrapper.  1 cup--1 baseball.   This information is not intended to replace advice given to you by your health care provider. Make sure you discuss any questions you have with your health care provider.   Document Released: 04/18/2005 Document Revised: 05/09/2014 Document Reviewed: 02/21/2013 Elsevier Interactive Patient Education [employer].

## 2015-07-27 NOTE — Progress Notes (Signed)
Subjective:  Patient ID: Brittney Farmer, female    DOB: 05/09/1985  Age: 30 y.o. MRN: [id number]  CC: Depression and GAD   HPI Loyd Lamotte presents for Recheck of sx above. Surveys below:  GAD 7 : Generalized Anxiety Score 07/27/2015  Nervous, Anxious, on Edge 0  Control/stop worrying 0  Worry too much - different things 0  Trouble relaxing 0  Restless 0  Easily annoyed or irritable 0  Afraid - awful might happen 0  Total GAD 7 Score 0     Depression screen [name] 2/9 07/27/2015 03/09/2015 12/16/2014 09/10/2014 06/06/2014  Decreased Interest 0 0 0 2 0  Down, Depressed, Hopeless 0 0 0 1 0  PHQ - 2 Score 0 0 0 3 0  Altered sleeping - - - 2 -  Tired, decreased energy - - - 2 -  Change in appetite - - - 1 -  Feeling bad or failure about yourself  - - - 1 -  Trouble concentrating - - - 0 -  Moving slowly or fidgety/restless - - - 0 -  Suicidal thoughts - - - 0 -  PHQ-9 Score - - - 9 -     As might be expected from above the patient is exuberant today stating that the medicines are working wonderfully well. Her life is great. She has no anxiety as long she takes her medicine on the other hand she starts getting fidgety if she misses the Xanax for very long. She is taking the others as noted in the medication list. Omeprazole and dicyclomine both helping with the reflux. [name] in for follow-up of GERD. Currently asymptomatic taking  PPI daily. There is no chest pain or heartburn. No hematemesis and no melena. No dysphagia or choking. Onset is remote. Progression is stable. Complicating factors, none. History [name] has a past medical history of IBS (irritable bowel syndrome).   She has no past surgical history on file.   Her family history includes Diabetes in her mother; Heart disease in her mother; Hyperlipidemia in her father and mother; Hypertension in her father and mother.She reports that she has never smoked. She does not have any smokeless tobacco history on file. Her  alcohol and drug histories are not on file.  Patient Active Problem List   Diagnosis Date Noted  . Obesity 12/22/2014  . Depression 08/04/2014  . GAD (generalized anxiety disorder) 06/06/2014  . Gastroesophageal reflux disease with esophagitis 06/06/2014  . IBS (irritable bowel syndrome) 06/06/2014     ROS Review of Systems  Constitutional: Negative for fever, activity change and appetite change.  HENT: Negative for congestion, rhinorrhea and sore throat.   Eyes: Negative for visual disturbance.  Respiratory: Negative for cough and shortness of breath.   Cardiovascular: Negative for chest pain and palpitations.  Gastrointestinal: Negative for nausea, abdominal pain and diarrhea.  Genitourinary: Negative for dysuria.  Musculoskeletal: Negative for myalgias and arthralgias.    Objective:  BP 111/65 mmHg  Pulse 102  Temp(Src) 96.8 F (36 C) (Oral)  Ht 5\' 4"  (1.626 m)  Wt 228 lb (103.42 kg)  BMI 39.12 kg/m2  SpO2 100%  LMP 06/29/2015  BP Readings from Last 3 Encounters:  07/27/15 111/65  04/14/15 126/81  03/09/15 124/75    Wt Readings from Last 3 Encounters:  07/27/15 228 lb (103.42 kg)  04/14/15 233 lb (105.688 kg)  03/09/15 228 lb 3.2 oz (103.511 kg)     Physical Exam  Constitutional: She is oriented to person, place, and time.  She appears well-developed and well-nourished. No distress.  HENT:  Head: Normocephalic and atraumatic.  Right Ear: External ear normal.  Left Ear: External ear normal.  Nose: Nose normal.  Mouth/Throat: Oropharynx is clear and moist.  Eyes: Conjunctivae and EOM are normal. Pupils are equal, round, and reactive to light.  Neck: Normal range of motion. Neck supple. No thyromegaly present.  Cardiovascular: Normal rate, regular rhythm and normal heart sounds.   No murmur heard. Pulmonary/Chest: Effort normal and breath sounds normal. No respiratory distress. She has no wheezes. She has no rales.  Abdominal: Soft. Bowel sounds are normal.  She exhibits no distension. There is no tenderness.  Lymphadenopathy:    She has no cervical adenopathy.  Neurological: She is alert and oriented to person, place, and time. She has normal reflexes.  Skin: Skin is warm and dry.  Psychiatric: She has a normal mood and affect. Her behavior is normal. Judgment and thought content normal.     Lab Results  Component Value Date   WBC 7.8 12/16/2014   HGB 10.9* 06/06/2014   HCT 35.8 12/16/2014   GLUCOSE 89 12/16/2014   CHOL 189 08/04/2014   TRIG 65 08/04/2014   HDL 49 08/04/2014   LDLCALC 127* 08/04/2014   ALT 12 12/16/2014   AST 14 12/16/2014   NA 139 12/16/2014   K 5.3* 12/16/2014   CL 99 12/16/2014   CREATININE 0.63 12/16/2014   BUN 13 12/16/2014   CO2 26 12/16/2014   TSH 2.270 08/04/2014    US Abdomen Limited Ruq  02/18/2014  CLINICAL DATA:  Right upper quadrant pain EXAM: US ABDOMEN LIMITED - RIGHT UPPER QUADRANT COMPARISON:  None. FINDINGS: Gallbladder: No gallstones or wall thickening visualized. Small amount of gallbladder sludge. No sonographic Murphy sign noted. Common bile duct: Diameter: 2.9 mm Liver: No focal lesion identified. Within normal limits in parenchymal echogenicity. IMPRESSION: No cholelithiasis or sonographic evidence of acute cholecystitis. Electronically Signed   By: Kathreen Devoid   On: 02/18/2014 10:07    Assessment & Plan:   Anastasia was seen today for depression and gad.  Diagnoses and all orders for this visit:  Gastroesophageal reflux disease with esophagitis  IBS (irritable bowel syndrome) -     dicyclomine (BENTYL) 10 MG capsule; Take 1 capsule (10 mg total) by mouth 2 (two) times daily with a meal.  GAD (generalized anxiety disorder) -     ALPRAZolam (XANAX) 0.5 MG tablet; Take 1 tablet (0.5 mg total) by mouth 2 (two) times daily.  Depression  Obesity  Other orders -     escitalopram (LEXAPRO) 10 MG tablet; Take 1 tablet (10 mg total) by mouth daily. -     omeprazole (PRILOSEC) 40 MG  capsule; Take 1 capsule (40 mg total) by mouth daily.    Patient has finally made some headway while using low-dose of alprazolam. Of note is this seems T in both her irritable bowel and her anxiety and depression situation. It doesn't seem appropriate at this time to wean her from this. We did discuss keeping the dose as well as possible and considering weaning in the future  I have discontinued Ms. Franzen's oseltamivir. I have also changed her ALPRAZolam. Additionally, I am having her maintain her dicyclomine, escitalopram, and omeprazole.  Meds ordered this encounter  Medications  . ALPRAZolam (XANAX) 0.5 MG tablet    Sig: Take 1 tablet (0.5 mg total) by mouth 2 (two) times daily.    Dispense:  60 tablet    Refill:  5  . dicyclomine (BENTYL) 10 MG capsule    Sig: Take 1 capsule (10 mg total) by mouth 2 (two) times daily with a meal.    Dispense:  120 capsule    Refill:  5  . escitalopram (LEXAPRO) 10 MG tablet    Sig: Take 1 tablet (10 mg total) by mouth daily.    Dispense:  90 tablet    Refill:  1  . omeprazole (PRILOSEC) 40 MG capsule    Sig: Take 1 capsule (40 mg total) by mouth daily.    Dispense:  90 capsule    Refill:  1     Follow-up: Return in about 6 months (around 01/27/2016).  Claretta Fraise, M.D.

## 2015-07-31 ENCOUNTER — Telehealth: Payer: Self-pay | Admitting: Family Medicine

## 2015-07-31 DIAGNOSIS — F411 Generalized anxiety disorder: Secondary | ICD-10-CM

## 2015-07-31 MED ORDER — ALPRAZOLAM 0.5 MG PO TABS: 0.5000 mg | ORAL_TABLET | Freq: Two times a day (BID) | ORAL | Status: DC

## 2015-07-31 MED ORDER — OMEPRAZOLE 40 MG PO CPDR: 40.0000 mg | DELAYED_RELEASE_CAPSULE | Freq: Every day | ORAL | Status: DC

## 2015-07-31 NOTE — Telephone Encounter (Signed)
Please call xanax, I sent the prilosec & wrote for the xanax

## 2015-07-31 NOTE — Telephone Encounter (Signed)
RX called into [facility] per pt request Okayed per Dr Livia Snellen

## 2015-09-04 ENCOUNTER — Telehealth: Payer: Self-pay | Admitting: Family Medicine

## 2015-09-07 MED ORDER — OMEPRAZOLE 40 MG PO CPDR: 80.0000 mg | DELAYED_RELEASE_CAPSULE | Freq: Every day | ORAL | Status: DC

## 2015-09-07 NOTE — Telephone Encounter (Signed)
Corrected Rx sent to pharmacy.

## 2015-10-03 IMAGING — US US ABDOMEN LIMITED
1 series · 14 of 25 positions shown · non-contrast
Comparison: None.

CLINICAL DATA: Right upper quadrant pain

EXAM:
US ABDOMEN LIMITED - RIGHT UPPER QUADRANT

[Series 1: us abdomen limited · 0.23mm/px · 14 of 54 slices shown]
[im 1/54]
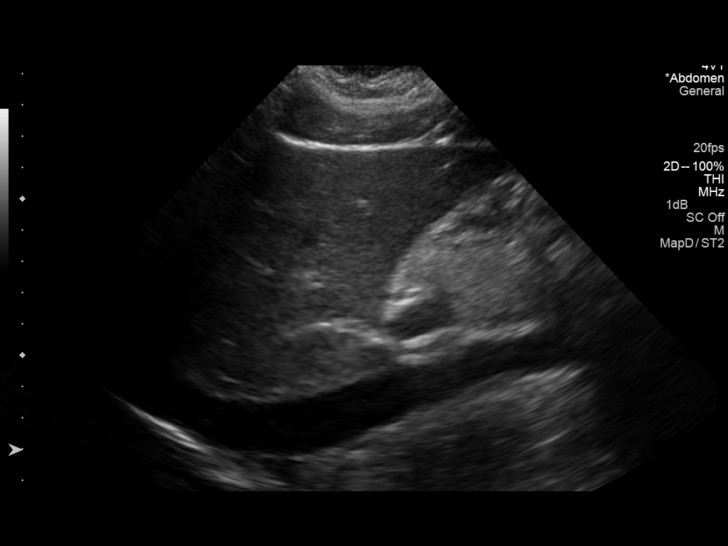
[im 5/54]
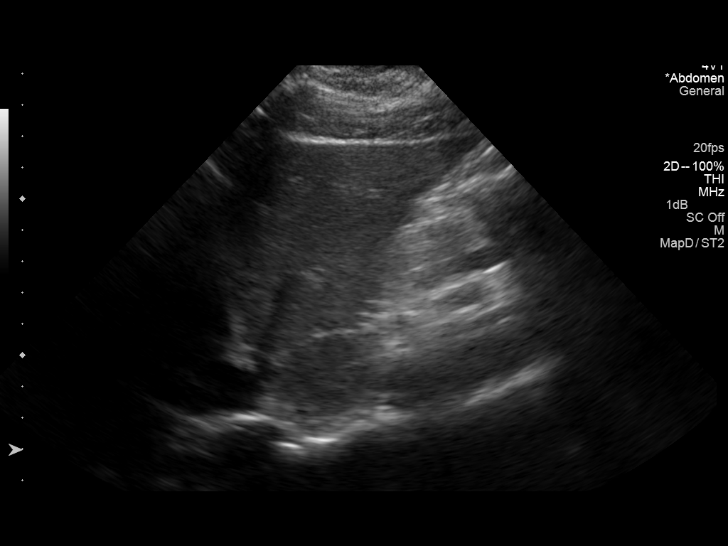
[im 9/54]
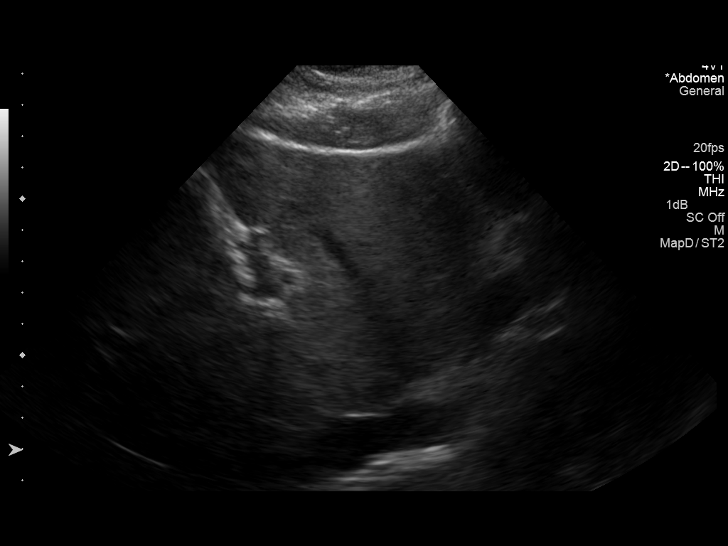
[im 14/54]
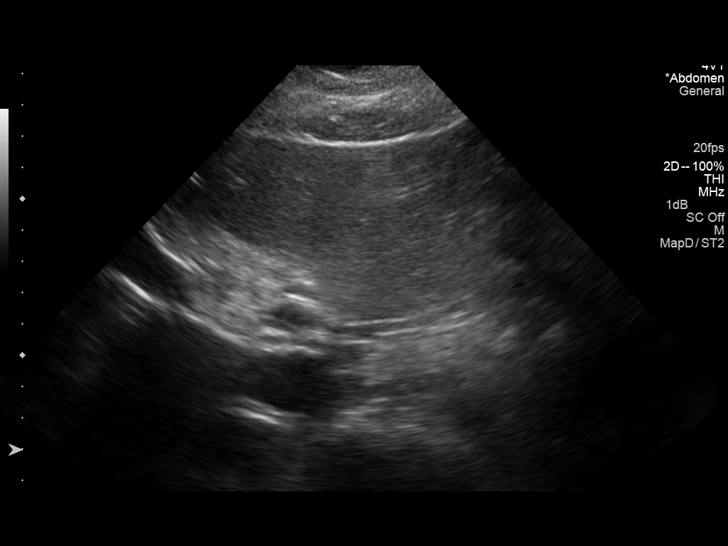
[im 18/54]
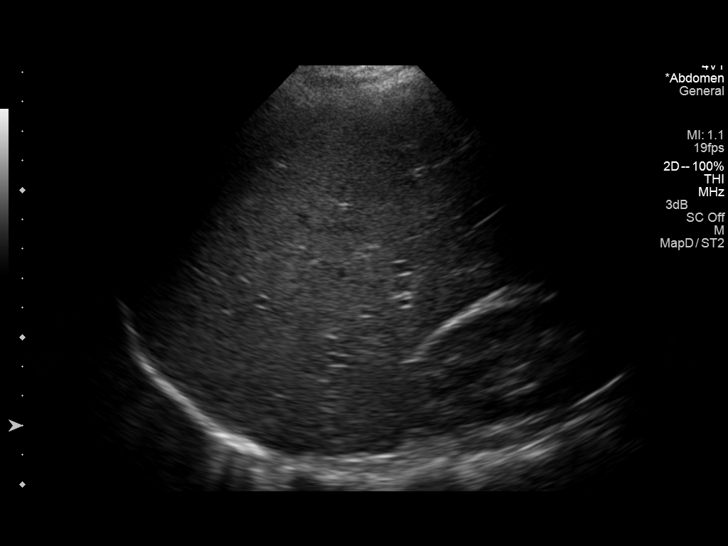
[im 20/54]
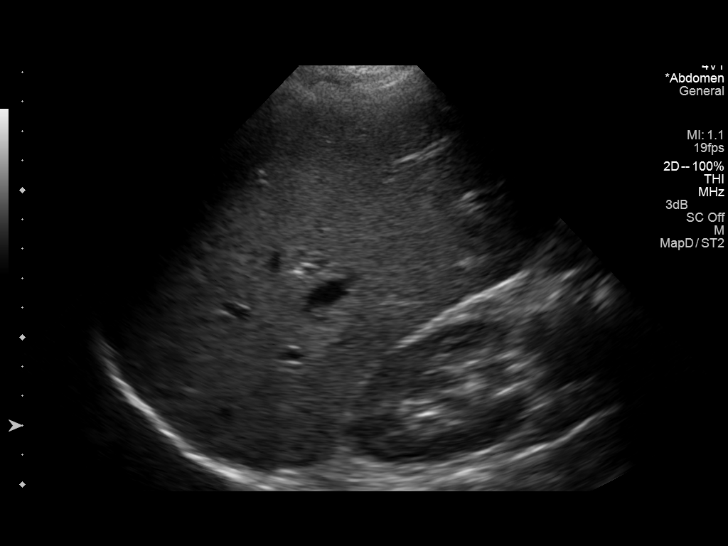
[im 25/54]
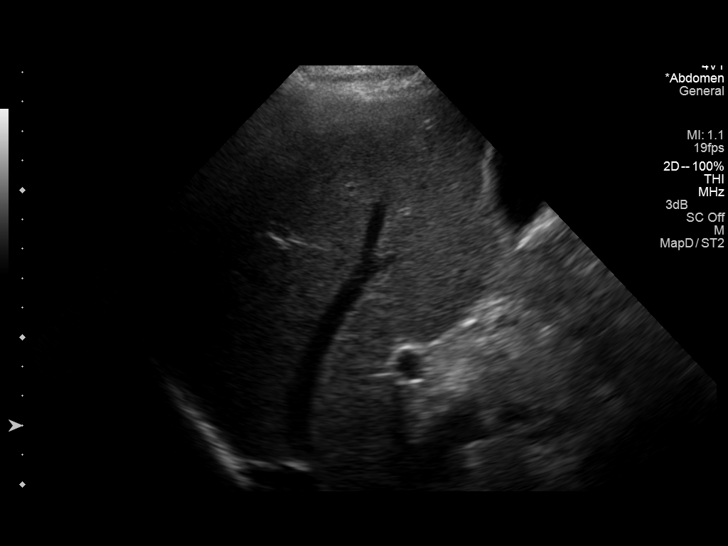
[im 29/54]
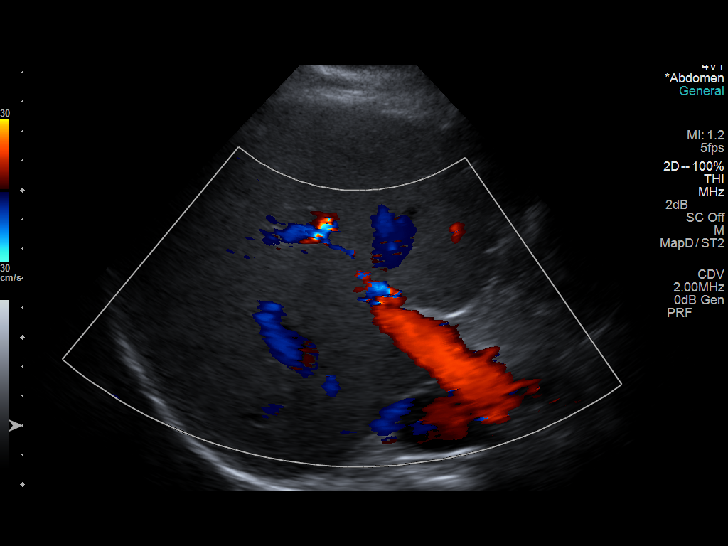
[im 34/54]
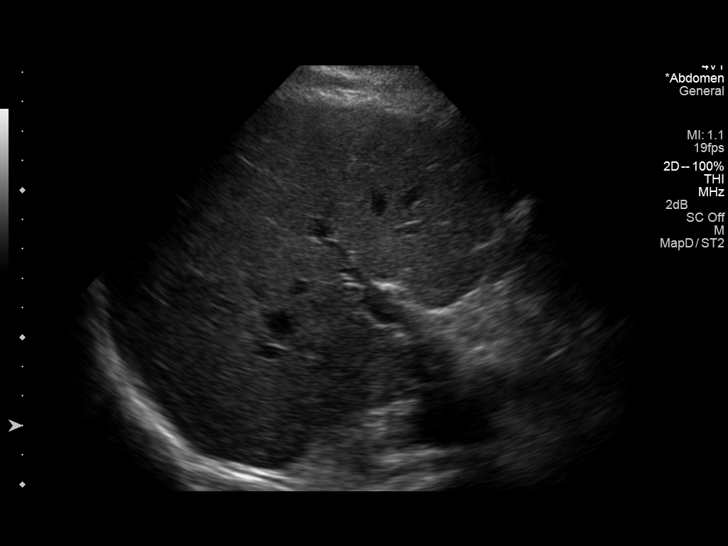
[im 36/54]
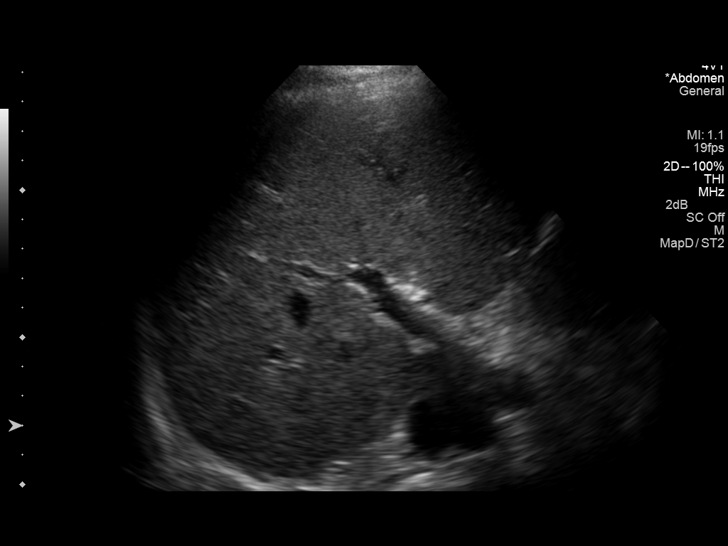
[im 40/54]
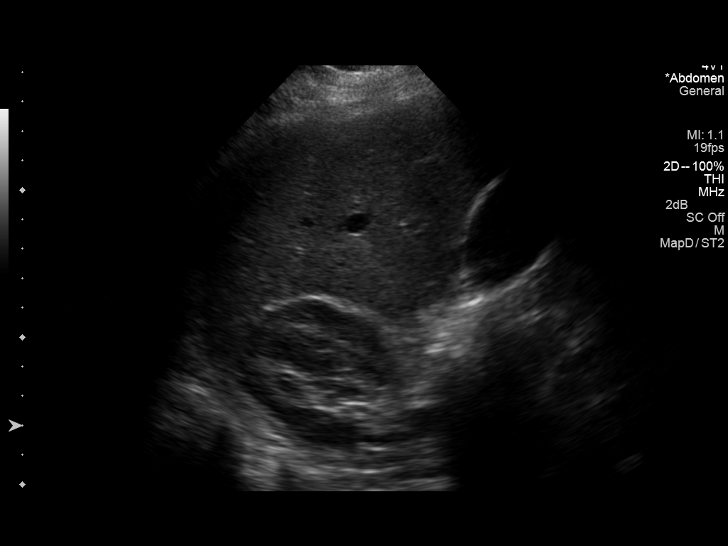
[im 45/54]
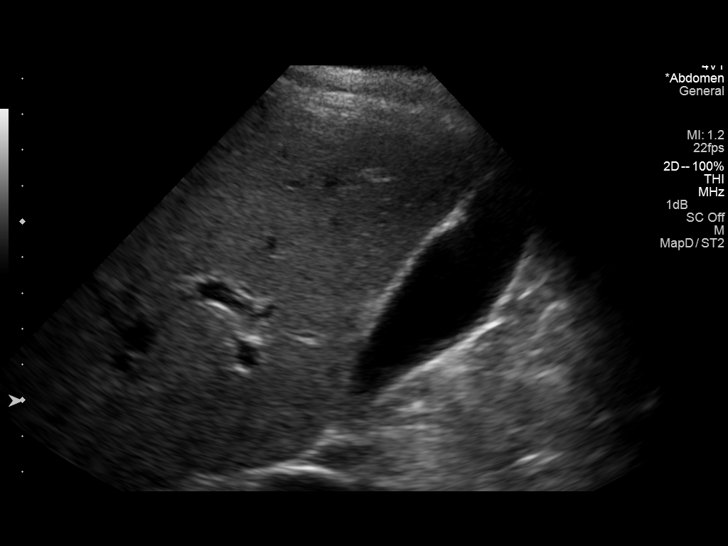
[im 49/54]
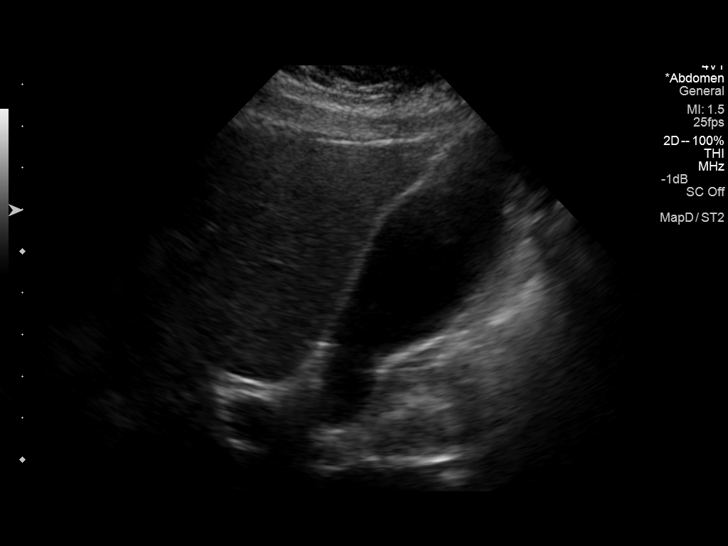
[im 54/54]
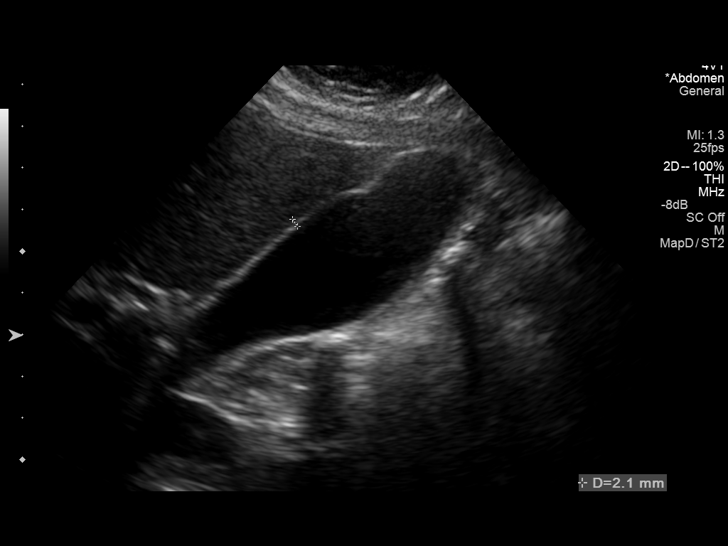

[14 of 25 positions shown; findings below may reference images not displayed]

FINDINGS: Gallbladder:

No gallstones or wall thickening visualized. Small amount of
gallbladder sludge. No sonographic Murphy sign noted.

Common bile duct:

Diameter: 2.9 mm

Liver:

No focal lesion identified. Within normal limits in parenchymal
echogenicity.
IMPRESSION: No cholelithiasis or sonographic evidence of acute cholecystitis.

## 2015-12-29 ENCOUNTER — Telehealth: Payer: Self-pay | Admitting: Family Medicine

## 2016-01-13 NOTE — Telephone Encounter (Signed)
Denied.

## 2016-02-09 ENCOUNTER — Other Ambulatory Visit: Payer: Self-pay | Admitting: Family Medicine

## 2016-02-09 DIAGNOSIS — F411 Generalized anxiety disorder: Secondary | ICD-10-CM

## 2016-06-07 ENCOUNTER — Ambulatory Visit: Payer: BLUE CROSS/BLUE SHIELD | Admitting: Family Medicine

## 2016-06-13 ENCOUNTER — Ambulatory Visit: Payer: BLUE CROSS/BLUE SHIELD | Admitting: Family Medicine

## 2016-06-15 ENCOUNTER — Encounter: Payer: Self-pay | Admitting: Family Medicine

## 2016-06-15 ENCOUNTER — Ambulatory Visit (INDEPENDENT_AMBULATORY_CARE_PROVIDER_SITE_OTHER): Payer: BLUE CROSS/BLUE SHIELD | Admitting: Family Medicine

## 2016-06-15 VITALS — BP 105/66 | HR 80 | Temp 97.8°F | Ht 64.0 in | Wt 237.0 lb

## 2016-06-15 DIAGNOSIS — K2100 Gastro-esophageal reflux disease with esophagitis, without bleeding: Secondary | ICD-10-CM

## 2016-06-15 DIAGNOSIS — G44219 Episodic tension-type headache, not intractable: Secondary | ICD-10-CM | POA: Diagnosis not present

## 2016-06-15 DIAGNOSIS — F3342 Major depressive disorder, recurrent, in full remission: Secondary | ICD-10-CM

## 2016-06-15 DIAGNOSIS — E669 Obesity, unspecified: Secondary | ICD-10-CM | POA: Diagnosis not present

## 2016-06-15 DIAGNOSIS — Z6841 Body Mass Index (BMI) 40.0 and over, adult: Secondary | ICD-10-CM

## 2016-06-15 DIAGNOSIS — IMO0001 Reserved for inherently not codable concepts without codable children: Secondary | ICD-10-CM

## 2016-06-15 DIAGNOSIS — K589 Irritable bowel syndrome without diarrhea: Secondary | ICD-10-CM | POA: Diagnosis not present

## 2016-06-15 DIAGNOSIS — F411 Generalized anxiety disorder: Secondary | ICD-10-CM | POA: Diagnosis not present

## 2016-06-15 MED ORDER — DULOXETINE HCL 60 MG PO CPEP: 60.0000 mg | ORAL_CAPSULE | Freq: Every day | ORAL | 2 refills | Status: DC

## 2016-06-15 NOTE — Progress Notes (Addendum)
Subjective:  Patient ID: Brittney Farmer, female    DOB: [birth year]  Age: 31 y.o. MRN: [id number]  CC: Depression (pt here today for routine follow up and refills on her lexapro and prilosec)   HPI [name] presents for Increasing anxiety. Also having a lot of headaches. Headaches start at the temples. They occur several times through the day lasting a few minutes each time. Sometimes they'll spread and cover the entire top of her head. She describes the pain as excruciating but cannot characterize other than "it's just a pain" the patient has a 32-monthold daughter. She is born on 01/05/2016 her name is Brittney Farmer. They call her [name]. Mom continues to breast-feed. She is concerned that she is actually gaining weight while breast-feeding. Ask for diet counseling/advice.  [facility] 06/15/2016 07/27/2015 03/09/2015 12/16/2014 09/10/2014  Decreased Interest 0 0 0 0 2  Down, Depressed, Hopeless 0 0 0 0 1  PHQ - 2 Score 0 0 0 0 3  Altered sleeping - - - - 2  Tired, decreased energy - - - - 2  Change in appetite - - - - 1  Feeling bad or failure about yourself  - - - - 1  Trouble concentrating - - - - 0  Moving slowly or fidgety/restless - - - - 0  Suicidal thoughts - - - - 0  PHQ-9 Score - - - - 9   GAD 7 : Generalized Anxiety Score 06/15/2016 07/27/2015  Nervous, Anxious, on Edge 3 0  Control/stop worrying 3 0  Worry too much - different things 3 0  Trouble relaxing 3 0  Restless 3 0  Easily annoyed or irritable 3 0  Afraid - awful might happen 2 0  Total GAD 7 Score 20 0  Anxiety Difficulty Very difficult -     Patient in for follow-up of GERD. Currently asymptomatic taking  PPI daily. There is no chest pain or heartburnUnless she eats something spicy like all the garden, pizza, [place].. No hematemesis and no melena. No dysphagia or choking. Onset is remote. Progression is stable. Complicating factors, none.  .Marland KitchenHistory JAunistyhas a past medical history of IBS  (irritable bowel syndrome).   She has no past surgical history on file.   Her family history includes Diabetes in her mother; Heart disease in her mother; Hyperlipidemia in her father and mother; Hypertension in her father and mother.She reports that she has never smoked. She has never used smokeless tobacco. Her alcohol and drug histories are not on file.    ROS Review of Systems  Constitutional: Negative for activity change, appetite change and fever.  HENT: Negative for congestion, rhinorrhea and sore throat.   Eyes: Negative for visual disturbance.  Respiratory: Negative for cough and shortness of breath.   Cardiovascular: Negative for chest pain and palpitations.  Gastrointestinal: Negative for abdominal pain, diarrhea and nausea.  Genitourinary: Negative for dysuria.  Musculoskeletal: Negative for arthralgias and myalgias.    Objective:  BP 105/66   Pulse 80   Temp 97.8 F (36.6 C) (Oral)   Ht 5' 4"  (1.626 m)   Wt 237 lb (107.5 kg)   BMI 40.68 kg/m   BP Readings from Last 3 Encounters:  06/15/16 105/66  07/27/15 111/65  04/14/15 126/81    Wt Readings from Last 3 Encounters:  06/15/16 237 lb (107.5 kg)  07/27/15 228 lb (103.4 kg)  04/14/15 233 lb (105.7 kg)     Physical Exam  Constitutional: She is  oriented to person, place, and time. She appears well-developed and well-nourished. No distress.  HENT:  Head: Normocephalic and atraumatic.  Right Ear: External ear normal.  Left Ear: External ear normal.  Nose: Nose normal.  Mouth/Throat: Oropharynx is clear and moist.  Eyes: Conjunctivae and EOM are normal. Pupils are equal, round, and reactive to light.  Neck: Normal range of motion. Neck supple. No thyromegaly present.  Cardiovascular: Normal rate, regular rhythm and normal heart sounds.   No murmur heard. Pulmonary/Chest: Effort normal and breath sounds normal. No respiratory distress. She has no wheezes. She has no rales.  Abdominal: Soft. Bowel sounds  are normal. She exhibits no distension and no mass. There is tenderness (epigastric, mild to moderate). There is no rebound and no guarding.  Lymphadenopathy:    She has no cervical adenopathy.  Neurological: She is alert and oriented to person, place, and time. She has normal reflexes.  Skin: Skin is warm and dry.  Psychiatric: She has a normal mood and affect. Her behavior is normal. Judgment and thought content normal.    US Abdomen Limited Ruq  Result Date: 02/18/2014 CLINICAL DATA:  Right upper quadrant pain EXAM: US ABDOMEN LIMITED - RIGHT UPPER QUADRANT COMPARISON:  None. FINDINGS: Gallbladder: No gallstones or wall thickening visualized. Small amount of gallbladder sludge. No sonographic Murphy sign noted. Common bile duct: Diameter: 2.9 mm Liver: No focal lesion identified. Within normal limits in parenchymal echogenicity. IMPRESSION: No cholelithiasis or sonographic evidence of acute cholecystitis. Electronically Signed   By: Kathreen Devoid   On: 02/18/2014 10:07    Assessment & Plan:   Kimbria was seen today for depression.  Diagnoses and all orders for this visit:  Gastroesophageal reflux disease with esophagitis -     CBC with Differential/Platelet -     CMP14+EGFR  GAD (generalized anxiety disorder) -     CMP14+EGFR  Irritable bowel syndrome, unspecified type -     CBC with Differential/Platelet -     CMP14+EGFR  Recurrent major depressive disorder, in full remission (HCC) -     CMP14+EGFR  Class 3 obesity with body mass index (BMI) of 40.0 to 44.9 in adult, unspecified obesity type, unspecified whether serious comorbidity present ([place]) -     CMP14+EGFR  Episodic tension-type headache, not intractable  Other orders -     DULoxetine (CYMBALTA) 60 MG capsule; Take 1 capsule (60 mg total) by mouth daily.      I have discontinued Ms. Bonnes's dicyclomine, escitalopram, and ALPRAZolam. I am also having her start on DULoxetine. Additionally, I am having her  maintain her omeprazole.  Allergies as of 06/15/2016   No Known Allergies     Medication List       Accurate as of 06/15/16  9:56 AM. Always use your most recent med list.          DULoxetine 60 MG capsule Commonly known as:  CYMBALTA Take 1 capsule (60 mg total) by mouth daily.   omeprazole 40 MG capsule Commonly known as:  PRILOSEC Take 2 capsules (80 mg total) by mouth daily.        Follow-up: Return in about 1 month (around 07/13/2016) for anxiety.  Claretta Fraise, M.D.

## 2016-06-27 ENCOUNTER — Telehealth (HOSPITAL_COMMUNITY): Payer: Self-pay | Admitting: Lactation Services

## 2016-06-27 NOTE — Telephone Encounter (Signed)
Baby now 65 months old and Mom requesting information regarding diet to lose weight. Denies medical conditions other the IBS, Anemia. Baby is exclusively BF 10-15 times/day. Advised Mom she needs 1500-1800 cal at least per day. Advised to be sure she get enough protein in her diet - reviewed low fat choices including lean meats, chicken and fish. Due to history of anemia, protein is important as well as multi vitamins or PNV Limit carbs to healthy carbs such as pasta. No snack foods, limit breads. Stressed importance of water - advised 8- 8 oz glasses per day to stay well hydrated.  Encouraged to ask MD for referral to nutritionist.

## 2016-07-04 ENCOUNTER — Other Ambulatory Visit: Payer: Self-pay | Admitting: Family Medicine

## 2016-07-04 ENCOUNTER — Ambulatory Visit: Payer: BLUE CROSS/BLUE SHIELD | Admitting: Family Medicine

## 2016-07-04 MED ORDER — ESCITALOPRAM OXALATE 10 MG PO TABS: 10.0000 mg | ORAL_TABLET | Freq: Every day | ORAL | 0 refills | Status: DC

## 2016-07-04 NOTE — Telephone Encounter (Signed)
Spoke to pt and she was put on Cymbalta at her last appt but pt states she only took it for about 5 days and didn't like the way it made her feel so she started taking the Lexapro again and had an appt today but had to reschedule for Monday since Dr. Livia Snellen was out sick but was going to run out of Lexapro and needed a refill to get her through till Monday. Rx sent into pharmacy as requested.

## 2016-07-11 ENCOUNTER — Ambulatory Visit: Payer: BLUE CROSS/BLUE SHIELD | Admitting: Family Medicine

## 2016-07-13 ENCOUNTER — Ambulatory Visit: Payer: BLUE CROSS/BLUE SHIELD | Admitting: Family Medicine

## 2016-07-20 ENCOUNTER — Ambulatory Visit (INDEPENDENT_AMBULATORY_CARE_PROVIDER_SITE_OTHER): Payer: BLUE CROSS/BLUE SHIELD | Admitting: Family Medicine

## 2016-07-20 ENCOUNTER — Encounter: Payer: Self-pay | Admitting: Family Medicine

## 2016-07-20 VITALS — BP 111/71 | HR 83 | Temp 97.0°F | Ht 64.0 in | Wt 244.0 lb

## 2016-07-20 DIAGNOSIS — E041 Nontoxic single thyroid nodule: Secondary | ICD-10-CM | POA: Diagnosis not present

## 2016-07-20 DIAGNOSIS — F331 Major depressive disorder, recurrent, moderate: Secondary | ICD-10-CM

## 2016-07-20 DIAGNOSIS — L659 Nonscarring hair loss, unspecified: Secondary | ICD-10-CM

## 2016-07-20 DIAGNOSIS — E669 Obesity, unspecified: Secondary | ICD-10-CM | POA: Diagnosis not present

## 2016-07-20 DIAGNOSIS — IMO0001 Reserved for inherently not codable concepts without codable children: Secondary | ICD-10-CM

## 2016-07-20 DIAGNOSIS — Z6841 Body Mass Index (BMI) 40.0 and over, adult: Secondary | ICD-10-CM

## 2016-07-20 MED ORDER — ESCITALOPRAM OXALATE 20 MG PO TABS: 20.0000 mg | ORAL_TABLET | Freq: Every day | ORAL | 5 refills | Status: DC

## 2016-07-20 NOTE — Progress Notes (Signed)
Subjective:  Patient ID: Brittney Farmer, female    DOB: [birth year]  Age: 31 y.o. MRN: [id number]  CC: Medication Problem (pt here today because she had to stop the Cymbalta because it made her very irritable and "ill")   HPI Brittney Farmer presents for Cymbalta caused her to feel more angry and moody and sad at times. She was far more easily agitated. Depression is actually worse now. See pH Q scores below. Of note is that she's had some headache that has eased off a bit but she still gets pulsation behind her eyes and of a ache in the crown. She went back to her Lexapro and it is he stopped somewhat. She still taking that daily. She is breast-feeding. Of note she tells me today that she does have a Mirena IUD in place now. Her baby 9 months old. She is concerned about thyroid disease because she's been losing hair and gaining weight. This in spite of breast-feeding and eating healthy. Depression screen [facility] 2/9 07/20/2016 06/15/2016 07/27/2015  Decreased Interest 2 0 0  Down, Depressed, Hopeless 2 0 0  PHQ - 2 Score 4 0 0  Altered sleeping 2 - -  Tired, decreased energy 3 - -  Change in appetite 3 - -  Feeling bad or failure about yourself  2 - -  Trouble concentrating 1 - -  Moving slowly or fidgety/restless 2 - -  Suicidal thoughts 0 - -  PHQ-9 Score 17 - -    History Brittney Farmer has a past medical history of IBS (irritable bowel syndrome).   She has no past surgical history on file.   Her family history includes Diabetes in her mother; Heart disease in her mother; Hyperlipidemia in her father and mother; Hypertension in her father and mother.She reports that she has never smoked. She has never used smokeless tobacco. Her alcohol and drug histories are not on file.  Current Outpatient Prescriptions on File Prior to Visit  Medication Sig Dispense Refill  . omeprazole (PRILOSEC) 40 MG capsule Take 2 capsules (80 mg total) by mouth daily. 180 capsule 1   No current facility-administered  medications on file prior to visit.     ROS Review of Systems  Constitutional: Positive for unexpected weight change. Negative for activity change, appetite change and fever.  HENT: Negative for congestion, rhinorrhea and sore throat.   Eyes: Negative for visual disturbance.  Respiratory: Negative for cough and shortness of breath.   Cardiovascular: Negative for chest pain and palpitations.  Gastrointestinal: Negative for abdominal pain, diarrhea and nausea.  Genitourinary: Negative for dysuria.  Musculoskeletal: Negative for arthralgias and myalgias.  Psychiatric/Behavioral: Positive for agitation and dysphoric mood. The patient is nervous/anxious.     Objective:  BP 111/71   Pulse 83   Temp 97 F (36.1 C) (Oral)   Ht 5' 4"  (1.626 m)   Wt 244 lb (110.7 kg)   BMI 41.88 kg/m   Physical Exam  Constitutional: She is oriented to person, place, and time. She appears well-developed and well-nourished. No distress.  HENT:  Head: Normocephalic and atraumatic.  Right Ear: External ear normal.  Left Ear: External ear normal.  Nose: Nose normal.  Mouth/Throat: Oropharynx is clear and moist.  Eyes: Conjunctivae and EOM are normal. Pupils are equal, round, and reactive to light.  Neck: Normal range of motion. Neck supple. No thyromegaly present.  Cardiovascular: Normal rate, regular rhythm and normal heart sounds.   No murmur heard. Pulmonary/Chest: Effort normal and breath sounds normal.  No respiratory distress. She has no wheezes. She has no rales.  Abdominal: Soft. Bowel sounds are normal. She exhibits no distension. There is no tenderness.  Lymphadenopathy:    She has no cervical adenopathy.  Neurological: She is alert and oriented to person, place, and time. She has normal reflexes.  Skin: Skin is warm and dry.  Psychiatric: She has a normal mood and affect. Her behavior is normal. Judgment and thought content normal.    Assessment & Plan:   Brittney Farmer was seen today for  medication problem.  Diagnoses and all orders for this visit:  Class 3 obesity with body mass index (BMI) of 40.0 to 44.9 in adult, unspecified obesity type, unspecified whether serious comorbidity present (HCC)  Moderate episode of recurrent major depressive disorder ([place])  Thyroid nodule -     US SOFT TISSUE HEAD AND NECK; Future -     TSH + free T4 -     CBC with Differential/Platelet -     CMP14+EGFR  Hair loss  Other orders -     escitalopram (LEXAPRO) 20 MG tablet; Take 1 tablet (20 mg total) by mouth daily.   I have changed Ms. Greenbaum's escitalopram. I am also having her maintain her omeprazole.  Meds ordered this encounter  Medications  . escitalopram (LEXAPRO) 20 MG tablet    Sig: Take 1 tablet (20 mg total) by mouth daily.    Dispense:  30 tablet    Refill:  5     Follow-up: Return in about 1 month (around 08/20/2016).  Claretta Fraise, M.D.

## 2016-07-21 LAB — CMP14+EGFR
ALBUMIN: 4.3 g/dL (ref 3.5–5.5)
ALK PHOS: 154 IU/L — AB (ref 39–117)
ALT: 9 IU/L (ref 0–32)
AST: 12 IU/L (ref 0–40)
Albumin/Globulin Ratio: 1.5 (ref 1.2–2.2)
BILIRUBIN TOTAL: 0.2 mg/dL (ref 0.0–1.2)
BUN / CREAT RATIO: 20 (ref 9–23)
BUN: 13 mg/dL (ref 6–20)
CHLORIDE: 99 mmol/L (ref 96–106)
CO2: 21 mmol/L (ref 18–29)
CREATININE: 0.66 mg/dL (ref 0.57–1.00)
Calcium: 9.3 mg/dL (ref 8.7–10.2)
GFR calc Af Amer: 139 mL/min/{1.73_m2} (ref >59)
GFR calc non Af Amer: 121 mL/min/{1.73_m2} (ref >59)
GLUCOSE: 86 mg/dL (ref 65–99)
Globulin, Total: 2.8 g/dL (ref 1.5–4.5)
Potassium: 4.3 mmol/L (ref 3.5–5.2)
Sodium: 139 mmol/L (ref 134–144)
Total Protein: 7.1 g/dL (ref 6.0–8.5)

## 2016-07-21 LAB — CBC WITH DIFFERENTIAL/PLATELET
BASOS ABS: 0 10*3/uL (ref 0.0–0.2)
Basos: 1 %
EOS (ABSOLUTE): 0.1 10*3/uL (ref 0.0–0.4)
Eos: 2 %
HEMOGLOBIN: 11.2 g/dL (ref 11.1–15.9)
Hematocrit: 34.8 % (ref 34.0–46.6)
IMMATURE GRANS (ABS): 0 10*3/uL (ref 0.0–0.1)
Immature Granulocytes: 0 %
LYMPHS ABS: 1.9 10*3/uL (ref 0.7–3.1)
Lymphs: 28 %
MCH: 24.4 pg — AB (ref 26.6–33.0)
MCHC: 32.2 g/dL (ref 31.5–35.7)
MCV: 76 fL — ABNORMAL LOW (ref 79–97)
MONOS ABS: 0.6 10*3/uL (ref 0.1–0.9)
Monocytes: 8 %
Neutrophils Absolute: 4.1 10*3/uL (ref 1.4–7.0)
Neutrophils: 61 %
PLATELETS: 362 10*3/uL (ref 150–379)
RBC: 4.59 x10E6/uL (ref 3.77–5.28)
RDW: 15.7 % — ABNORMAL HIGH (ref 12.3–15.4)
WBC: 6.7 10*3/uL (ref 3.4–10.8)

## 2016-07-21 LAB — TSH+FREE T4
Free T4: 0.94 ng/dL (ref 0.82–1.77)
TSH: 1.66 u[IU]/mL (ref 0.450–4.500)

## 2016-07-27 ENCOUNTER — Ambulatory Visit (HOSPITAL_COMMUNITY): Payer: Medicaid Other

## 2016-08-04 ENCOUNTER — Ambulatory Visit (HOSPITAL_COMMUNITY): Payer: BLUE CROSS/BLUE SHIELD

## 2016-08-15 ENCOUNTER — Telehealth: Payer: Self-pay | Admitting: Family Medicine

## 2016-08-16 NOTE — Telephone Encounter (Signed)
appt scheduled

## 2016-08-24 ENCOUNTER — Other Ambulatory Visit: Payer: Self-pay | Admitting: *Deleted

## 2016-08-24 ENCOUNTER — Ambulatory Visit: Payer: BLUE CROSS/BLUE SHIELD | Admitting: Family Medicine

## 2016-08-24 MED ORDER — ESCITALOPRAM OXALATE 20 MG PO TABS: 20.0000 mg | ORAL_TABLET | Freq: Every day | ORAL | 1 refills | Status: DC

## 2016-08-26 ENCOUNTER — Ambulatory Visit: Payer: BLUE CROSS/BLUE SHIELD | Admitting: Family Medicine

## 2016-08-29 ENCOUNTER — Ambulatory Visit (HOSPITAL_COMMUNITY): Admission: RE | Admit: 2016-08-29 | Payer: BLUE CROSS/BLUE SHIELD | Source: Ambulatory Visit

## 2016-09-01 ENCOUNTER — Ambulatory Visit: Payer: BLUE CROSS/BLUE SHIELD | Admitting: Family Medicine

## 2016-09-01 ENCOUNTER — Ambulatory Visit (INDEPENDENT_AMBULATORY_CARE_PROVIDER_SITE_OTHER): Payer: BLUE CROSS/BLUE SHIELD

## 2016-09-01 ENCOUNTER — Ambulatory Visit (INDEPENDENT_AMBULATORY_CARE_PROVIDER_SITE_OTHER): Payer: BLUE CROSS/BLUE SHIELD | Admitting: Pediatrics

## 2016-09-01 ENCOUNTER — Encounter: Payer: Self-pay | Admitting: Pediatrics

## 2016-09-01 VITALS — BP 119/80 | HR 76 | Temp 97.4°F | Ht 64.0 in | Wt 247.8 lb

## 2016-09-01 DIAGNOSIS — M79642 Pain in left hand: Secondary | ICD-10-CM
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
## 2016-09-06 ENCOUNTER — Telehealth: Payer: Self-pay | Admitting: Family Medicine

## 2016-09-06 NOTE — Telephone Encounter (Signed)
Faxed

## 2016-09-08 HISTORY — PX: HAND SURGERY: SHX662

## 2016-09-14 ENCOUNTER — Ambulatory Visit (INDEPENDENT_AMBULATORY_CARE_PROVIDER_SITE_OTHER): Payer: BLUE CROSS/BLUE SHIELD | Admitting: Pediatrics

## 2016-09-14 ENCOUNTER — Encounter: Payer: Self-pay | Admitting: Pediatrics

## 2016-09-14 VITALS — BP 115/72 | HR 76 | Temp 97.4°F | Ht 64.0 in | Wt 246.0 lb

## 2016-09-14 DIAGNOSIS — F329 Major depressive disorder, single episode, unspecified: Secondary | ICD-10-CM

## 2016-09-14 DIAGNOSIS — F32A Depression, unspecified: Secondary | ICD-10-CM

## 2016-09-14 DIAGNOSIS — Z6841 Body Mass Index (BMI) 40.0 and over, adult: Secondary | ICD-10-CM | POA: Diagnosis not present

## 2016-09-14 DIAGNOSIS — M79642 Pain in left hand: Secondary | ICD-10-CM
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
## 2016-11-14 ENCOUNTER — Ambulatory Visit (INDEPENDENT_AMBULATORY_CARE_PROVIDER_SITE_OTHER): Payer: BLUE CROSS/BLUE SHIELD | Admitting: Family

## 2016-11-14 ENCOUNTER — Encounter: Payer: Self-pay | Admitting: Family

## 2016-11-14 ENCOUNTER — Ambulatory Visit (INDEPENDENT_AMBULATORY_CARE_PROVIDER_SITE_OTHER): Payer: BLUE CROSS/BLUE SHIELD

## 2016-11-14 VITALS — BP 118/75 | HR 75 | Temp 97.3°F | Ht 64.0 in | Wt 251.8 lb

## 2016-11-14 DIAGNOSIS — R0781 Pleurodynia: Secondary | ICD-10-CM

## 2016-11-14 DIAGNOSIS — S20211A Contusion of right front wall of thorax, initial encounter: Secondary | ICD-10-CM | POA: Diagnosis not present

## 2016-11-14 DIAGNOSIS — S298XXA Other specified injuries of thorax, initial encounter: Secondary | ICD-10-CM

## 2016-11-14 DIAGNOSIS — R0789 Other chest pain: Secondary | ICD-10-CM

## 2016-11-14 MED ORDER — BACLOFEN 10 MG PO TABS: 10.0000 mg | ORAL_TABLET | Freq: Three times a day (TID) | ORAL | 0 refills | Status: DC

## 2016-11-14 NOTE — Patient Instructions (Signed)
Rib Contusion A rib contusion is a deep bruise on your rib area. Contusions are the result of a blunt trauma that causes bleeding and injury to the tissues under the skin. A rib contusion may involve bruising of the ribs and of the skin and muscles in the area. The skin overlying the contusion may turn blue, purple, or yellow. Minor injuries will give you a painless contusion, but more severe contusions may stay painful and swollen for a few weeks. What are the causes? A contusion is usually caused by a blow, trauma, or direct force to an area of the body. This often occurs while playing contact sports. What are the signs or symptoms?  Swelling and redness of the injured area.  Discoloration of the injured area.  Tenderness and soreness of the injured area.  Pain with or without movement. How is this diagnosed? The diagnosis can be made by taking a medical history and performing a physical exam. An X-ray, CT scan, or MRI may be needed to determine if there were any associated injuries, such as broken bones (fractures) or internal injuries. How is this treated? Often, the best treatment for a rib contusion is rest. Icing or applying cold compresses to the injured area may help reduce swelling and inflammation. Deep breathing exercises may be recommended to reduce the risk of partial lung collapse and pneumonia. Over-the-counter or prescription medicines may also be recommended for pain control. Follow these instructions at home:  Apply ice to the injured area: ? Put ice in a plastic bag. ? Place a towel between your skin and the bag. ? Leave the ice on for 20 minutes, 2-3 times per day.  Take medicines only as directed by your health care provider.  Rest the injured area. Avoid strenuous activity and any activities or movements that cause pain. Be careful during activities and avoid bumping the injured area.  Perform deep-breathing exercises as directed by your health care provider.  Do  not lift anything that is heavier than 5 lb (2.3 kg) until your health care provider approves.  Do not use any tobacco products, including cigarettes, chewing tobacco, or electronic cigarettes. If you need help quitting, ask your health care provider. Contact a health care provider if:  You have increased bruising or swelling.  You have pain that is not controlled with treatment.  You have a fever. Get help right away if:  You have difficulty breathing or shortness of breath.  You develop a continual cough, or you cough up thick or bloody sputum.  You feel sick to your stomach (nauseous), you throw up (vomit), or you have abdominal pain. This information is not intended to replace advice given to you by your health care provider. Make sure you discuss any questions you have with your health care provider. Document Released: 01/11/2001 Document Revised: 09/24/2015 Document Reviewed: 01/28/2014 Elsevier Interactive Patient Education  2018 Elsevier Inc.  

## 2016-11-14 NOTE — Progress Notes (Signed)
   Subjective:    Patient ID: Brittney Farmer, female    DOB: 05/08/1985, 31 y.o.   MRN: [id number]  Fall  The accident occurred more than 1 week ago. The fall occurred while standing. She fell from a height of 3 to 5 ft. She landed on grass. Point of impact: bilateral hands. Pain location: right chest/ rib area. The pain is at a severity of 7/10. The pain is mild. The symptoms are aggravated by movement and standing. Pertinent negatives include no fever, headaches, loss of consciousness, tingling or visual change. She has tried NSAID for the symptoms. The treatment provided mild relief.  Chest Pain   Pertinent negatives include no fever or headaches.      Review of Systems  Constitutional: Negative for fever.  Cardiovascular: Positive for chest pain.  Neurological: Negative for tingling, loss of consciousness and headaches.  All other systems reviewed and are negative.      Objective:   Physical Exam  Constitutional: She is oriented to person, place, and time. She appears well-developed and well-nourished. No distress.  HENT:  Head: Normocephalic.  Eyes: Pupils are equal, round, and reactive to light.  Cardiovascular: Normal rate, regular rhythm, normal heart sounds and intact distal pulses.   No murmur heard. Pulmonary/Chest: Effort normal and breath sounds normal. No respiratory distress. She has no wheezes.  Abdominal: Soft. Bowel sounds are normal. She exhibits no distension. There is no tenderness.  Musculoskeletal: Normal range of motion. She exhibits tenderness. She exhibits no edema.  Pain with palpation on right lower ribs  Neurological: She is alert and oriented to person, place, and time.  Skin: Skin is warm and dry.  Psychiatric: She has a normal mood and affect. Her behavior is normal. Judgment and thought content normal.  Vitals reviewed.  Rib x-ray- Negative Preliminary reading by Evelina Dun, FNP [facility]   BP 118/75   Pulse 75   Temp (!) 97.3 F (36.3 C)  (Oral)   Ht 5\' 4"  (1.626 m)   Wt 251 lb 12.8 oz (114.2 kg)   BMI 43.22 kg/m      Assessment & Plan:  1. Rib pain - DG Ribs Unilateral W/Chest Left; Future - baclofen (LIORESAL) 10 MG tablet; Take 1 tablet (10 mg total) by mouth 3 (three) times daily.  Dispense: 60 each; Refill: 0  2. Contusion of rib on right side, initial encounter - baclofen (LIORESAL) 10 MG tablet; Take 1 tablet (10 mg total) by mouth 3 (three) times daily.  Dispense: 60 each; Refill: 0  Rest Ice  Continue motrin 800 mg  RTO prn   Evelina Dun, FNP

## 2016-11-21 ENCOUNTER — Encounter: Payer: Self-pay | Admitting: Pediatrics

## 2016-11-21 ENCOUNTER — Ambulatory Visit (INDEPENDENT_AMBULATORY_CARE_PROVIDER_SITE_OTHER): Payer: BLUE CROSS/BLUE SHIELD | Admitting: Pediatrics

## 2016-11-21 VITALS — BP 138/83 | HR 81 | Temp 97.3°F | Ht 64.0 in | Wt 249.8 lb

## 2016-11-21 DIAGNOSIS — F32A Depression, unspecified: Secondary | ICD-10-CM

## 2016-11-21 DIAGNOSIS — Z6841 Body Mass Index (BMI) 40.0 and over, adult: Secondary | ICD-10-CM

## 2016-11-21 DIAGNOSIS — R0781 Pleurodynia: Secondary | ICD-10-CM

## 2016-11-21 DIAGNOSIS — F329 Major depressive disorder, single episode, unspecified: Secondary | ICD-10-CM

## 2016-11-21 MED ORDER — FLUOXETINE HCL 20 MG PO TABS: 20.0000 mg | ORAL_TABLET | Freq: Every day | ORAL | 3 refills | Status: DC

## 2016-11-21 NOTE — Progress Notes (Signed)
  Subjective:   Patient ID: Brittney Farmer, female    DOB: 01-05-1986, 32 y.o.   MRN: [id number] CC: Follow-up (2 month)  HPI: Brittney Farmer is a 31 y.o. female presenting for Follow-up (2 month)  Was 135 lbs before 27yo son was born, gained 50 lbs while on depo Has gained weight with each pregnancy since then Now has paraguard Was 202 lbs after having 10 mo girl Has gained 50 lbs in last few months  Drinking mostly water Drinking sweet tea 1-2 cups at most a day  Stays fairly active at work, not regularly walking outside of work  Mood stays down Has been on lexapro for months, starting before pregnancy Was tried on cymbalta and zoloft in the past, caused very irritable mood, says she was "mean" Sleeping from 11-6am, waking up at 2am and 4am to breastfeed infant No other family members can help with night awakenings per pt  No fevers, normal appetite  Relevant past medical, surgical, family and social history reviewed. Allergies and medications reviewed and updated. History  Smoking Status  . Never Smoker  Smokeless Tobacco  . Never Used   ROS: Per HPI   Objective:    BP 138/83   Pulse 81   Temp (!) 97.3 F (36.3 C) (Oral)   Ht 5\' 4"  (1.626 m)   Wt 249 lb 12.8 oz (113.3 kg)   BMI 42.88 kg/m   Wt Readings from Last 3 Encounters:  11/21/16 249 lb 12.8 oz (113.3 kg)  11/14/16 251 lb 12.8 oz (114.2 kg)  09/14/16 246 lb (111.6 kg)    Gen: NAD, alert, cooperative with exam, NCAT EYES: EOMI, no conjunctival injection, or no icterus ENT:  OP without erythema CV: NRRR, normal S1/S2, no murmur, distal pulses 2+ b/l Resp: CTABL, no wheezes, normal WOB Abd: +BS, soft, NTND. Ext: No edema, warm Neuro: Alert and oriented  Assessment & Plan:  Brittney Farmer was seen today for follow-up multiple med problems.  Diagnoses and all orders for this visit:  Depression, unspecified depression type Take 10mg  lexapro for a week, then stop and start prozac Discussed side effects, let  me knwo if any worsening symptoms Feels safe at home, able to take care of kids  -     FLUoxetine (PROZAC) 20 MG tablet; Take 1 tablet (20 mg total) by mouth daily.  BMI 40.0-44.9, adult (HCC) Walk 30 min every day in the morning or after dinner Discussed strategies to improve sleep, talk with pediatrician about sleep training infant Planning to stop breastfeeding in 2-3 mo Will rtc then, discuss medication options  Rib pain Improving, baclofen minimally helpful Stop baclofen  Follow up plan: Return in about 3 months (around 02/21/2017) for med follow up. Assunta Found, MD [place]

## 2016-12-09 ENCOUNTER — Telehealth: Payer: Self-pay | Admitting: Family Medicine

## 2016-12-09 MED ORDER — ESCITALOPRAM OXALATE 10 MG PO TABS: 10.0000 mg | ORAL_TABLET | Freq: Every day | ORAL | 5 refills | Status: DC

## 2016-12-09 NOTE — Telephone Encounter (Signed)
lexapro 10mg  sent in. Stop fluoxetine. Take half tab of lexapro for 4 days, then take full tab.  Let me know re symptoms after 2 weeks bein gon 10mg  lexapro.

## 2016-12-09 NOTE — Telephone Encounter (Signed)
Per pt's husband, pt is real emotional, withdrawn from kids and family, crying Pt wants to go back on Lexapro Please advise

## 2016-12-09 NOTE — Telephone Encounter (Signed)
Pt's husband notified of recommendation Verbalizes understanding 

## 2017-01-05 ENCOUNTER — Telehealth: Payer: Self-pay | Admitting: Pediatrics

## 2017-01-05 MED ORDER — ESCITALOPRAM OXALATE 20 MG PO TABS: 20.0000 mg | ORAL_TABLET | Freq: Every day | ORAL | 3 refills | Status: DC

## 2017-01-05 NOTE — Telephone Encounter (Signed)
Patient aware.

## 2017-01-05 NOTE — Telephone Encounter (Signed)
Patient states that she is back up to 20mg  of lexapro and wants to know if we can go ahead and send in 20mg  of lexapro. Patient states she was supposed to build herself back up to 20mg .

## 2017-02-21 ENCOUNTER — Encounter: Payer: Self-pay | Admitting: Pediatrics

## 2017-02-21 ENCOUNTER — Ambulatory Visit (INDEPENDENT_AMBULATORY_CARE_PROVIDER_SITE_OTHER): Payer: BLUE CROSS/BLUE SHIELD | Admitting: Pediatrics

## 2017-02-21 VITALS — BP 139/85 | HR 75 | Temp 98.0°F | Ht 64.0 in | Wt 256.4 lb

## 2017-02-21 DIAGNOSIS — Z6841 Body Mass Index (BMI) 40.0 and over, adult: Secondary | ICD-10-CM | POA: Diagnosis not present

## 2017-02-21 DIAGNOSIS — F419 Anxiety disorder, unspecified: Secondary | ICD-10-CM | POA: Diagnosis not present

## 2017-02-21 DIAGNOSIS — F439 Reaction to severe stress, unspecified: Secondary | ICD-10-CM

## 2017-02-21 DIAGNOSIS — F339 Major depressive disorder, recurrent, unspecified: Secondary | ICD-10-CM

## 2017-02-21 MED ORDER — PHENTERMINE HCL 15 MG PO CAPS: 15.0000 mg | ORAL_CAPSULE | ORAL | 0 refills | Status: DC

## 2017-02-21 MED ORDER — CLONAZEPAM 0.125 MG PO TBDP: 0.1250 mg | ORAL_TABLET | Freq: Two times a day (BID) | ORAL | 0 refills | Status: DC | PRN

## 2017-02-21 MED ORDER — BUSPIRONE HCL 5 MG PO TABS: 5.0000 mg | ORAL_TABLET | Freq: Three times a day (TID) | ORAL | 1 refills | Status: DC

## 2017-02-21 NOTE — Patient Instructions (Addendum)
[email or web address]  Buspar: take 5mg  twice a day for 3 days then can take three times a day   Your provider wants you to schedule an appointment with a Psychologist/Psychiatrist. The following list of offices requires the patient to call and make their own appointment, as there is information they need that only you can provide. Please feel free to choose form the following providers:  [facility] in [place]  [place]  [phone] [place], [place]  (Scheduled through [place]) Must call and do an interview for appointment. Sees Children / Accepts Medicaid  [facility] [place]  [address] [place]    [place], [place]  [phone] [place], [place] for Autism but does not treat it Sees Children / Accepts Medicaid  [facility]    [phone] [address] [place], [place] Medication management, substance abuse, bipolar, grief, family, marriage, OCD, anxiety, PTSD Sees children / Accepts Medicaid  [facility]    [phone] [address] [place], [place] children / Accepts [facility]  [facility]  [phone] [address] [place], [place]   Dr Lorenza Evangelist     [phone] [address], [place], [place]  Sees ADD & ADHD for treatment Accepts Medicaid  [facility]  [phone] Arlean Hopping, [place] for Autism Accepts [facility]  [facility]  [phone] [place], [place]  Does Adult ADD evaluations Does not accept Medicaid  [facility]   [phone] [place], [place] children as young as 68 years old Accepts [facility]     [phone]    [place], [place] [address] Sees children Accepts Medicaid

## 2017-02-21 NOTE — Progress Notes (Signed)
Subjective:   Patient ID: Brittney Farmer, female    DOB: [birth year], 31 y.o.   MRN: [id number] CC: Follow-up (3 month) anxiety, depression HPI: Brittney Farmer is a 31 y.o. female presenting for Follow-up (3 month)  Mood has been worse Has had xanax prescribed in the past, taken one daily for the past week Now finished with breast feeding, has 30mo at home, also 29yo, 31yo Has had a couple breakdowns, locked herself in the bathroom and cried  Works as Network engineer, very stressful Trouble with husband, they have been talking about divorce  Tried sertraline, cymbalta, effexor in the past--remembers it being terrible Most recently last visit tried fluoxetine, made her mood much worse  A couple of times in the past month has wondered if kids would be better off without her Not feeling like that now or in the past week Has no thoughts of self harm No plan for self harm  Remains against counseling Counseling [facility], says it was not helpful [facility], says counseling doesn't work  Cut back on sodas at home Trying to avoid snack foods Very worried about weight gain, very interested in weight loss  Relevant past medical, surgical, family and social history reviewed. Allergies and medications reviewed and updated. History  Smoking Status  . Never Smoker  Smokeless Tobacco  . Never Used   ROS: Per HPI   Objective:    BP 139/85   Pulse 75   Temp 98 F (36.7 C) (Oral)   Ht 5\' 4"  (1.626 m)   Wt 256 lb 6.4 oz (116.3 kg)   BMI 44.01 kg/m   Wt Readings from Last 3 Encounters:  02/21/17 256 lb 6.4 oz (116.3 kg)  11/21/16 249 lb 12.8 oz (113.3 kg)  11/14/16 251 lb 12.8 oz (114.2 kg)    Gen: NAD, alert, cooperative with exam, NCAT EYES: EOMI, no conjunctival injection, or no icterus ENT:   OP without erythema CV: NRRR, normal S1/S2, no murmur, distal pulses 2+ b/l Resp: CTABL, no wheezes, normal WOB Ext: No edema,  warm Neuro: Alert and oriented, strength equal b/l UE and LE, coordination grossly normal Psych: normal affect, no thoughts of self harm now  Assessment & Plan:  Latori was seen today for follow-up med problems  Diagnoses and all orders for this visit:  Depression, recurrent ([place]) Stress at home Passive SI in past month, none now Contemplating divorce Strongly recommended counseling with upcoming life decisions, need for improved communication at home Gave list of counselors in area Pt open to trying  Anxiety Counseling as above Cont lexapro Start buspar Rx for #20 tabs of klonopin given Goal for short term Rx, discussed do not take every day Must be seen for further refills rtc any worsening in symptoms -     busPIRone (BUSPAR) 5 MG tablet; Take 1 tablet (5 mg total) by mouth 3 (three) times daily. -     clonazepam (KLONOPIN) 0.125 MG disintegrating tablet; Take 1 tablet (0.125 mg total) by mouth 2 (two) times daily as needed for seizure.  BMI 40.0-44.9, adult (HCC) Weight gain has bothered pt a lot, affecting mood Start low dose of below Discussed weight loss strategies Check BPs at home Any worsening in symptoms stop below, let me know -     phentermine 15 MG capsule; Take 1 capsule (15 mg total) by mouth every morning.   Follow up plan: Return in about 4 weeks (around 03/21/2017). Brittney Farmer  Brittney Farmer, Brittney Farmer

## 2017-03-22 ENCOUNTER — Ambulatory Visit: Payer: BLUE CROSS/BLUE SHIELD | Admitting: Pediatrics

## 2017-03-22 ENCOUNTER — Encounter: Payer: Self-pay | Admitting: Pediatrics

## 2017-03-22 VITALS — BP 130/78 | HR 83 | Temp 97.5°F | Ht 64.0 in | Wt 255.0 lb

## 2017-03-22 DIAGNOSIS — F329 Major depressive disorder, single episode, unspecified: Secondary | ICD-10-CM | POA: Diagnosis not present

## 2017-03-22 DIAGNOSIS — Z6841 Body Mass Index (BMI) 40.0 and over, adult: Secondary | ICD-10-CM

## 2017-03-22 DIAGNOSIS — F419 Anxiety disorder, unspecified: Secondary | ICD-10-CM | POA: Diagnosis not present

## 2017-03-22 DIAGNOSIS — F32A Depression, unspecified: Secondary | ICD-10-CM

## 2017-03-22 MED ORDER — PHENTERMINE HCL 30 MG PO CAPS: 30.0000 mg | ORAL_CAPSULE | ORAL | 2 refills | Status: DC

## 2017-03-22 MED ORDER — BUSPIRONE HCL 10 MG PO TABS: 10.0000 mg | ORAL_TABLET | Freq: Two times a day (BID) | ORAL | 2 refills | Status: DC

## 2017-03-22 MED ORDER — BUSPIRONE HCL 10 MG PO TABS: 10.0000 mg | ORAL_TABLET | Freq: Three times a day (TID) | ORAL | 2 refills | Status: DC

## 2017-03-22 NOTE — Progress Notes (Signed)
  Subjective:   Patient ID: Brittney Farmer, female    DOB: [birth year], 31 y.o.   MRN: [id number] CC: Follow-up (4 week) wt loss, anxiety HPI: Brittney Farmer is a 31 y.o. female presenting for Follow-up (4 week)  Elevated BMI: Drinking primarily water Stopped sodas Open to walking more, has a treadmill at home Hard to find the time, working full time, kids at home including toddler Phentermine not making anxiety worse  Mood has been better Anxiety improved on buspar, has a hard time remembering noon dose Doesn't think morning dose lasts throughout the day Has only had to take the klonopin twice since last visit  Relevant past medical, surgical, family and social history reviewed. Allergies and medications reviewed and updated. Social History   Tobacco Use  Smoking Status Never Smoker  Smokeless Tobacco Never Used   ROS: Per HPI   Objective:    BP 130/78   Pulse 83   Temp (!) 97.5 F (36.4 C) (Oral)   Ht 5\' 4"  (1.626 m)   Wt 255 lb (115.7 kg)   BMI 43.77 kg/m   Wt Readings from Last 3 Encounters:  03/22/17 255 lb (115.7 kg)  02/21/17 256 lb 6.4 oz (116.3 kg)  11/21/16 249 lb 12.8 oz (113.3 kg)    Gen: NAD, alert, cooperative with exam, NCAT EYES: EOMI, no conjunctival injection, or no icterus ENT:  OP without erythema CV: NRRR, normal S1/S2, no murmur, distal pulses 2+ b/l Resp: CTABL, no wheezes, normal WOB Abd: +BS, soft, NTND. no guarding or organomegaly Ext: No edema, warm Neuro: Alert and oriented MSK: normal muscle bulk Psych: nl affect, no thoughts of self harm  Assessment & Plan:  Brittney Farmer was seen today for follow-up med problems  Diagnoses and all orders for this visit:  Anxiety Ongoing symptoms, increase from 5TID to 10mg  BID -     busPIRone (BUSPAR) 10 MG tablet; Take 1 tablet (10 mg total) by mouth 3 (three) times daily.  BMI 40.0-44.9, adult (HCC) Continue to discuss wt loss strategies Increase to 30mg  phentermine Take daily Stop snacks,  walk 5-10 min on treadmill daily -     phentermine 30 MG capsule; Take 1 capsule (30 mg total) by mouth every morning.  Depression, unspecified depression type Stable, Cont lexapro  Follow up plan: Return in about 2 months (around 05/22/2017). Assunta Found, MD [place]

## 2017-05-17 ENCOUNTER — Encounter: Payer: Self-pay | Admitting: Pediatrics

## 2017-05-17 ENCOUNTER — Ambulatory Visit: Payer: BLUE CROSS/BLUE SHIELD | Admitting: Pediatrics

## 2017-05-17 VITALS — BP 128/79 | HR 88 | Temp 97.0°F | Ht 64.0 in | Wt 247.4 lb

## 2017-05-17 DIAGNOSIS — Z6841 Body Mass Index (BMI) 40.0 and over, adult: Secondary | ICD-10-CM

## 2017-05-17 DIAGNOSIS — F419 Anxiety disorder, unspecified: Secondary | ICD-10-CM

## 2017-05-17 DIAGNOSIS — R51 Headache: Secondary | ICD-10-CM | POA: Diagnosis not present

## 2017-05-17 DIAGNOSIS — K219 Gastro-esophageal reflux disease without esophagitis: Secondary | ICD-10-CM

## 2017-05-17 DIAGNOSIS — R519 Headache, unspecified: Secondary | ICD-10-CM

## 2017-05-17 MED ORDER — BUSPIRONE HCL 10 MG PO TABS: 10.0000 mg | ORAL_TABLET | Freq: Two times a day (BID) | ORAL | 2 refills | Status: DC

## 2017-05-17 MED ORDER — OMEPRAZOLE 40 MG PO CPDR: 40.0000 mg | DELAYED_RELEASE_CAPSULE | Freq: Every day | ORAL | 1 refills | Status: DC

## 2017-05-17 MED ORDER — ESCITALOPRAM OXALATE 20 MG PO TABS: 20.0000 mg | ORAL_TABLET | Freq: Every day | ORAL | 3 refills | Status: DC

## 2017-05-17 MED ORDER — PHENTERMINE HCL 30 MG PO CAPS: 30.0000 mg | ORAL_CAPSULE | ORAL | 2 refills | Status: DC

## 2017-05-17 NOTE — Progress Notes (Signed)
  Subjective:   Patient ID: Brittney Farmer, female    DOB: Aug 15, 1985, 32 y.o.   MRN: [id number] CC: Follow-up (6 week) anxiety and weight HPI: Brittney Farmer is a 32 y.o. female presenting for Follow-up (6 week)  Started on phentermine last visit apprx 6 weeks ago Very pleased with weight loss  Sometimes has headaches in the morning Sometimes feels like the room is spinnning Takes ibuprofen, minimal help Started two weeks ago when she cold-turkey stopped drinking caffeine/sodas, was drinking several a day Symptoms much improved over the last week  Not drinking any sweet drinks, minimizing sugar intake Avoiding carbs Walking treadmill regularly, walking about a mile a day  No side effects from the phentermine when she started it, no headaches, no increase in anxiety  Relevant past medical, surgical, family and social history reviewed. Allergies and medications reviewed and updated. Social History   Tobacco Use  Smoking Status Never Smoker  Smokeless Tobacco Never Used   ROS: Per HPI   Objective:    BP 128/79   Pulse 88   Temp (!) 97 F (36.1 C) (Oral)   Ht 5\' 4"  (1.626 m)   Wt 247 lb 6.4 oz (112.2 kg)   BMI 42.47 kg/m   Wt Readings from Last 3 Encounters:  05/17/17 247 lb 6.4 oz (112.2 kg)  03/22/17 255 lb (115.7 kg)  02/21/17 256 lb 6.4 oz (116.3 kg)    Gen: NAD, alert, cooperative with exam, NCAT EYES: EOMI, no conjunctival injection, or no icterus ENT:  OP without erythema LYMPH: no cervical LAD CV: NRRR, normal S1/S2, no murmur, distal pulses 2+ b/l Resp: CTABL, no wheezes, normal WOB Abd: +BS, soft, NTND.  Ext: No edema, warm Neuro: Alert and oriented, strength equal b/l UE and LE, coordination grossly normal, CN III-XII intact MSK: normal muscle bulk  Assessment & Plan:  Brittney Farmer was seen today for follow-up med problems  Diagnoses and all orders for this visit:  Gastroesophageal reflux disease, esophagitis presence not specified Stable on below -      omeprazole (PRILOSEC) 40 MG capsule; Take 1 capsule (40 mg total) by mouth daily.  BMI 40.0-44.9, adult ([place]) Has had some weight loss with below, will cont for 3 more months -     phentermine 30 MG capsule; Take 1 capsule (30 mg total) by mouth every morning.  Anxiety Stable on below, cont -     escitalopram (LEXAPRO) 20 MG tablet; Take 1 tablet (20 mg total) by mouth daily. -     busPIRone (BUSPAR) 10 MG tablet; Take 1 tablet (10 mg total) by mouth 2 (two) times daily.  Nonintractable episodic headache, unspecified headache type Suspect related to caffeine withdrawal, now almost resolved If HA return pt aware needs to be seen  Follow up plan: Return in about 3 months (around 08/15/2017). Assunta Found, MD [place]

## 2017-05-18 DIAGNOSIS — Z6841 Body Mass Index (BMI) 40.0 and over, adult: Secondary | ICD-10-CM | POA: Insufficient documentation

## 2017-05-30 ENCOUNTER — Telehealth: Payer: Self-pay

## 2017-05-30 NOTE — Telephone Encounter (Signed)
Insurance denied prior auth for Phentermine because she has used her benefit up for this drug  Only can get 84 pills in 365  days

## 2017-05-30 NOTE — Telephone Encounter (Signed)
Please let pt know below. She can pay cash, with coupon from goodrx.com depending on pharmacy she goes to probably $15-$30 for a month.

## 2017-05-30 NOTE — Telephone Encounter (Signed)
Patient aware.  She said she did not think she had gotten this many in 365 days so I advised her to contact her insurance company about this.

## 2017-06-12 ENCOUNTER — Other Ambulatory Visit: Payer: Self-pay | Admitting: *Deleted

## 2017-06-12 DIAGNOSIS — F419 Anxiety disorder, unspecified: Secondary | ICD-10-CM

## 2017-06-12 MED ORDER — BUSPIRONE HCL 10 MG PO TABS: 10.0000 mg | ORAL_TABLET | Freq: Two times a day (BID) | ORAL | 0 refills | Status: DC

## 2017-08-16 ENCOUNTER — Encounter: Payer: Self-pay | Admitting: Pediatrics

## 2017-08-16 ENCOUNTER — Ambulatory Visit: Payer: BLUE CROSS/BLUE SHIELD | Admitting: Pediatrics

## 2017-08-16 DIAGNOSIS — Z6841 Body Mass Index (BMI) 40.0 and over, adult: Secondary | ICD-10-CM

## 2017-08-16 DIAGNOSIS — F419 Anxiety disorder, unspecified: Secondary | ICD-10-CM | POA: Diagnosis not present

## 2017-08-16 MED ORDER — PROPRANOLOL HCL 10 MG PO TABS: 10.0000 mg | ORAL_TABLET | Freq: Three times a day (TID) | ORAL | 3 refills | Status: DC | PRN

## 2017-08-16 MED ORDER — ESCITALOPRAM OXALATE 20 MG PO TABS: 20.0000 mg | ORAL_TABLET | Freq: Every day | ORAL | 3 refills | Status: DC

## 2017-08-16 MED ORDER — PHENTERMINE HCL 30 MG PO CAPS: 30.0000 mg | ORAL_CAPSULE | ORAL | 0 refills | Status: DC

## 2017-08-16 MED ORDER — BUSPIRONE HCL 15 MG PO TABS: 15.0000 mg | ORAL_TABLET | Freq: Two times a day (BID) | ORAL | 3 refills | Status: DC

## 2017-08-16 NOTE — Patient Instructions (Signed)

## 2017-08-16 NOTE — Progress Notes (Signed)
  Subjective:   Patient ID: Brittney Farmer, female    DOB: [birth year], 32 y.o.   MRN: [id number] CC: Follow-up (3 month) Anxiety and BMI HPI: Brittney Farmer is a 32 y.o. female presenting for Follow-up (3 month)  Anxiety: Ongoing symptoms.  Feels her self control with her children more so than she would like.  Sometimes feels embarrassed or flushed, feels her heart racing she is in meetings or talking to people at work.  She did not have this feeling as much when she was on Xanax in the past.  Elevated BMI: Going to the gym 60-90 minutes every morning.  5 days a week.  Has been very pleased with her weight loss of far.    Relevant past medical, surgical, family and social history reviewed. Allergies and medications reviewed and updated. Social History   Tobacco Use  Smoking Status Never Smoker  Smokeless Tobacco Never Used   ROS: Per HPI   Objective:    BP 124/77   Pulse 81   Temp 97.7 F (36.5 C) (Oral)   Ht 5\' 4"  (1.626 m)   Wt 212 lb (96.2 kg)   BMI 36.39 kg/m   Wt Readings from Last 3 Encounters:  08/16/17 212 lb (96.2 kg)  05/17/17 247 lb 6.4 oz (112.2 kg)  03/22/17 255 lb (115.7 kg)    Gen: NAD, alert, cooperative with exam, NCAT EYES: EOMI, no conjunctival injection, or no icterus ENT:  OP without erythema LYMPH: no cervical LAD CV: NRRR, normal S1/S2, no murmur, distal pulses 2+ b/l Resp: CTABL, no wheezes, normal WOB Abd: +BS, soft, NTND. no guarding or organomegaly Ext: No edema, warm Neuro: Alert and oriented, strength equal b/l UE and LE, coordination grossly normal MSK: normal muscle bulk Psych: Normal affect.  Mood has been good.  Assessment & Plan:  Brittney Farmer was seen today for follow-up multiple medical problems.  Diagnoses and all orders for this visit:  Anxiety Ongoing symptoms.  Continue to encourage counseling.  Increase BuSpar, continue Lexapro.  Okay to take propranolol 10 mg up to 3 times a day as needed for social anxiety.  She does not think  anxiety is been worse since being on the phentermine. -     busPIRone (BUSPAR) 15 MG tablet; Take 1 tablet (15 mg total) by mouth 2 (two) times daily. -     escitalopram (LEXAPRO) 20 MG tablet; Take 1 tablet (20 mg total) by mouth daily. -     propranolol (INDERAL) 10 MG tablet; Take 1 tablet (10 mg total) by mouth 3 (three) times daily as needed.  BMI 40.0-44.9, adult ([facility]) We will do 30 more days of phentermine before taking a break.  Prescription sent in.  Continue lifestyle changes -     phentermine 30 MG capsule; Take 1 capsule (30 mg total) by mouth every morning.   Follow up plan: Return in about 4 months (around 12/16/2017). Assunta Found, MD [place]

## 2017-08-22 ENCOUNTER — Other Ambulatory Visit: Payer: Self-pay | Admitting: *Deleted

## 2017-08-22 DIAGNOSIS — F419 Anxiety disorder, unspecified: Secondary | ICD-10-CM

## 2017-08-22 MED ORDER — BUSPIRONE HCL 15 MG PO TABS: 15.0000 mg | ORAL_TABLET | Freq: Two times a day (BID) | ORAL | 0 refills | Status: DC

## 2017-08-22 NOTE — Telephone Encounter (Signed)
Rx written 08/16/17 #60 with 3 RFs Request for 90 day Rx. Sent in

## 2017-08-24 ENCOUNTER — Encounter: Payer: Self-pay | Admitting: Pediatrics

## 2017-08-24 ENCOUNTER — Other Ambulatory Visit: Payer: Self-pay | Admitting: Pediatrics

## 2017-08-24 ENCOUNTER — Ambulatory Visit: Payer: BLUE CROSS/BLUE SHIELD | Admitting: Pediatrics

## 2017-08-24 VITALS — BP 124/77 | HR 69 | Temp 97.0°F | Ht 64.0 in | Wt 209.0 lb

## 2017-08-24 DIAGNOSIS — D485 Neoplasm of uncertain behavior of skin: Secondary | ICD-10-CM | POA: Diagnosis not present

## 2017-08-24 DIAGNOSIS — D492 Neoplasm of unspecified behavior of bone, soft tissue, and skin: Secondary | ICD-10-CM

## 2017-08-24 NOTE — Progress Notes (Signed)
  Subjective:   Patient ID: Brittney Farmer, female    DOB: 1985/09/28, 32 y.o.   MRN: [id number] CC: Bumps on bilateral arms and left leg  HPI: Brittney Farmer is a 32 y.o. female presenting for Bumps on bilateral arms and left leg  Here for removal.  Left leg lesion bleeds frequently when she nicks it when shaving.  Similar left upper inner arm small nodule.  No visible lesion on skin, slightly raised.  Gets caught in her clothes or on her bra.  Relevant past medical, surgical, family and social history reviewed. Allergies and medications reviewed and updated. Social History   Tobacco Use  Smoking Status Never Smoker  Smokeless Tobacco Never Used   ROS: Per HPI   Objective:    BP 124/77   Pulse 69   Temp (!) 97 F (36.1 C) (Oral)   Ht 5\' 4"  (1.626 m)   Wt 209 lb (94.8 kg)   BMI 35.87 kg/m   Wt Readings from Last 3 Encounters:  08/24/17 209 lb (94.8 kg)  08/16/17 212 lb (96.2 kg)  05/17/17 247 lb 6.4 oz (112.2 kg)    Skin: 3 mm nodule left upper inner arm, palpable, slightly raised from skin.   Left upper  anterior thigh with hyper pigmented 3 mm papule.   Assessment & Plan:  Brittney Farmer was seen today for bumps on bilateral arms and left leg.  Diagnoses and all orders for this visit:  Skin neoplasm Risks and benefits of removing discussed.  May have irregular pigmentation in both locations due to scarring.  May return, should be seen.  Cover with sunscreen. -     Pathology  After risks and benefits and procedure itself discussed in detail, pt agreed to proceed with biopsy of skin lesion on left upper arm and left anterior thigh. Areas were thoroughly cleaned and prepped with Betadine. 50mL of 1% lidocaine with epi was injected under the the lesion Anterior thigh was done as a shave biopsy.  Using a dermablade, the lesion was removed. A 4 mm punch biopsy was taken of left arm superficial nodule. Wounds dressed, wound care discussed with pt.  Follow up plan: As  scheduled Assunta Found, MD [place]

## 2017-08-28 LAB — PATHOLOGY

## 2017-08-30 ENCOUNTER — Other Ambulatory Visit: Payer: Self-pay | Admitting: Family Medicine

## 2017-08-31 ENCOUNTER — Telehealth: Payer: Self-pay | Admitting: Pediatrics

## 2017-08-31 NOTE — Telephone Encounter (Signed)
Aware of results. 

## 2017-10-02 ENCOUNTER — Encounter: Payer: Self-pay | Admitting: Physician Assistant

## 2017-10-02 ENCOUNTER — Ambulatory Visit: Payer: BLUE CROSS/BLUE SHIELD | Admitting: Physician Assistant

## 2017-10-02 VITALS — BP 110/76 | HR 73 | Temp 96.9°F | Ht 64.0 in | Wt 204.0 lb

## 2017-10-02 DIAGNOSIS — J0100 Acute maxillary sinusitis, unspecified: Secondary | ICD-10-CM

## 2017-10-02 MED ORDER — AMOXICILLIN 875 MG PO TABS: 875.0000 mg | ORAL_TABLET | Freq: Two times a day (BID) | ORAL | 0 refills | Status: DC

## 2017-10-02 NOTE — Progress Notes (Signed)
  Subjective:     Patient ID: Brittney Farmer, female   DOB: 02-05-1986, 32 y.o.   MRN: [id number]  HPI Pt with 10-14 days of sinus pain and pressure Has tried multiple OTC meds with no relief of sx  Review of Systems  Constitutional: Negative.   HENT: Positive for congestion, postnasal drip, rhinorrhea, sinus pressure and sinus pain. Negative for ear discharge, ear pain and sore throat.   Eyes: Negative.   Respiratory: Positive for cough. Negative for chest tightness, shortness of breath and wheezing.   Cardiovascular: Negative.        Objective:   Physical Exam  Constitutional: She appears well-developed and well-nourished. No distress.  HENT:  Right Ear: External ear normal.  Left Ear: External ear normal.  Mouth/Throat: Oropharynx is clear and moist. No oropharyngeal exudate.  Canal and TM's nl + maxillary sinus TTP  Neck: Neck supple.  Cardiovascular: Normal rate, regular rhythm and normal heart sounds.  Pulmonary/Chest: Effort normal and breath sounds normal.  Lymphadenopathy:    She has no cervical adenopathy.  Skin: She is not diaphoretic.  Nursing note and vitals reviewed.      Assessment:     1. Acute maxillary sinusitis, recurrence not specified        Plan:     Fluids Rest OTC antihist/decongest Amox 875mg  bid x 10 days F/U prn

## 2017-10-02 NOTE — Patient Instructions (Signed)

## 2017-11-13 ENCOUNTER — Other Ambulatory Visit: Payer: Self-pay | Admitting: Pediatrics

## 2017-11-13 DIAGNOSIS — F419 Anxiety disorder, unspecified: Secondary | ICD-10-CM

## 2017-11-17 ENCOUNTER — Other Ambulatory Visit: Payer: Self-pay | Admitting: Pediatrics

## 2017-11-17 NOTE — Telephone Encounter (Signed)
Pt's appt has been changed to next week

## 2017-11-22 ENCOUNTER — Ambulatory Visit: Payer: BLUE CROSS/BLUE SHIELD | Admitting: Pediatrics

## 2017-11-22 ENCOUNTER — Encounter: Payer: Self-pay | Admitting: Pediatrics

## 2017-11-22 VITALS — BP 113/77 | HR 65 | Temp 97.3°F | Ht 64.0 in | Wt 201.0 lb

## 2017-11-22 DIAGNOSIS — R51 Headache: Secondary | ICD-10-CM

## 2017-11-22 DIAGNOSIS — Z6841 Body Mass Index (BMI) 40.0 and over, adult: Secondary | ICD-10-CM

## 2017-11-22 DIAGNOSIS — K219 Gastro-esophageal reflux disease without esophagitis: Secondary | ICD-10-CM

## 2017-11-22 DIAGNOSIS — R519 Headache, unspecified: Secondary | ICD-10-CM

## 2017-11-22 DIAGNOSIS — F419 Anxiety disorder, unspecified: Secondary | ICD-10-CM | POA: Diagnosis not present

## 2017-11-22 MED ORDER — PROPRANOLOL HCL 10 MG PO TABS: 10.0000 mg | ORAL_TABLET | Freq: Three times a day (TID) | ORAL | 1 refills | Status: DC | PRN

## 2017-11-22 MED ORDER — OMEPRAZOLE 40 MG PO CPDR: 40.0000 mg | DELAYED_RELEASE_CAPSULE | Freq: Every day | ORAL | 1 refills | Status: DC

## 2017-11-22 MED ORDER — TOPIRAMATE 25 MG PO TABS: ORAL_TABLET | ORAL | 3 refills | Status: DC

## 2017-11-22 MED ORDER — BUSPIRONE HCL 15 MG PO TABS: 15.0000 mg | ORAL_TABLET | Freq: Two times a day (BID) | ORAL | 1 refills | Status: DC

## 2017-11-22 MED ORDER — ESCITALOPRAM OXALATE 20 MG PO TABS: 20.0000 mg | ORAL_TABLET | Freq: Every day | ORAL | 1 refills | Status: DC

## 2017-11-22 MED ORDER — PHENTERMINE HCL 30 MG PO CAPS: 30.0000 mg | ORAL_CAPSULE | ORAL | 1 refills | Status: DC

## 2017-11-22 NOTE — Progress Notes (Signed)
Subjective:   Patient ID: Brittney Farmer, female    DOB: [birth year], 32 y.o.   MRN: [id number] CC: Medical Management of Chronic Issues  HPI: Brittney Farmer is a 32 y.o. female   Elevated BMI: Continues to make great efforts at avoiding inappropriate foods, minimizing sugar intake, increasing fruit and vegetable intake.  She thinks her weight got down to the low 190s when she was on the phentermine.  She has had some weight gain since stopping the phentermine after being on it for 6 months.  She has about doing a slower taper off the phentermine to help without rebound weight gain.  She has been very pleased with the weight loss she has had so far.  She has more energy.  Headaches: Have been bothering her more regularly.  At least 3 days a week.  She does sometimes take Tylenol or ibuprofen, sometimes with some improvement in symptoms.  Sometimes she gets nauseous with the headaches.  Feels like a regular headache.  She has and whether she is taking the phentermine or not.  They have not gotten any better since she stopped the phentermine about a month ago.  Reflux: Symptoms improved, not needing PPI regularly, still takes it as needed  Anxiety: Symptoms have been better since being on Lexapro.   Relevant past medical, surgical, family and social history reviewed. Allergies and medications reviewed and updated. Social History   Tobacco Use  Smoking Status Never Smoker  Smokeless Tobacco Never Used   ROS: Per HPI   Objective:    BP 113/77   Pulse 65   Temp (!) 97.3 F (36.3 C) (Oral)   Ht 5\' 4"  (1.626 m)   Wt 201 lb (91.2 kg)   BMI 34.50 kg/m   Wt Readings from Last 3 Encounters:  11/22/17 201 lb (91.2 kg)  10/02/17 204 lb (92.5 kg)  08/24/17 209 lb (94.8 kg)    Gen: NAD, alert, cooperative with exam, NCAT EYES: EOMI, no conjunctival injection, or no icterus CV: NRRR, normal S1/S2, no murmur, distal pulses 2+ b/l Resp: CTABL, no wheezes, normal WOB Abd: +BS, soft, NTND. no  guarding or organomegaly Ext: No edema, warm Neuro: Alert and oriented, strength equal b/l UE and LE, coordination grossly normal MSK: normal muscle bulk Psych: affect normal  Assessment & Plan:  Brittney Farmer was seen today for medical management of chronic issues.  Diagnoses and all orders for this visit:  Nonintractable episodic headache, unspecified headache type Given frequency of headaches, reasonable to try preventive medicine.  Start below. -     topiramate (TOPAMAX) 25 MG tablet; Start once a day for two weeks then twice a day  Anxiety Symptoms stable, continue below. -     busPIRone (BUSPAR) 15 MG tablet; Take 1 tablet (15 mg total) by mouth 2 (two) times daily. -     escitalopram (LEXAPRO) 20 MG tablet; Take 1 tablet (20 mg total) by mouth daily. -     propranolol (INDERAL) 10 MG tablet; Take 1 tablet (10 mg total) by mouth 3 (three) times daily as needed.  Gastroesophageal reflux disease, esophagitis presence not specified As a refill for below, taking as needed. -     omeprazole (PRILOSEC) 40 MG capsule; Take 1 capsule (40 mg total) by mouth daily.  BMI 40.0-44.9, adult ([place]) Waking she stopped the phentermine, consistent with expectations.  Will restart for 2 months then plan on slower taper off.  Continue lifestyle changes. -     phentermine 30 MG capsule; Take  1 capsule (30 mg total) by mouth every morning.   Follow up plan: 3 mo, sooner prn Assunta Found, MD [place]

## 2017-12-18 ENCOUNTER — Ambulatory Visit: Payer: BLUE CROSS/BLUE SHIELD | Admitting: Pediatrics

## 2018-02-15 ENCOUNTER — Other Ambulatory Visit: Payer: Self-pay | Admitting: Pediatrics

## 2018-02-15 DIAGNOSIS — R51 Headache: Principal | ICD-10-CM

## 2018-02-15 DIAGNOSIS — R519 Headache, unspecified: Secondary | ICD-10-CM

## 2018-02-28 ENCOUNTER — Ambulatory Visit: Payer: BLUE CROSS/BLUE SHIELD | Admitting: Pediatrics

## 2018-02-28 ENCOUNTER — Encounter: Payer: Self-pay | Admitting: Pediatrics

## 2018-02-28 VITALS — BP 117/76 | HR 75 | Temp 97.4°F | Ht 64.0 in | Wt 200.4 lb

## 2018-02-28 DIAGNOSIS — Z6834 Body mass index (BMI) 34.0-34.9, adult: Secondary | ICD-10-CM

## 2018-02-28 DIAGNOSIS — G43809 Other migraine, not intractable, without status migrainosus: Secondary | ICD-10-CM

## 2018-02-28 DIAGNOSIS — F339 Major depressive disorder, recurrent, unspecified: Secondary | ICD-10-CM

## 2018-02-28 MED ORDER — RIZATRIPTAN BENZOATE 10 MG PO TABS: 10.0000 mg | ORAL_TABLET | ORAL | 2 refills | Status: DC | PRN

## 2018-02-28 MED ORDER — TOPIRAMATE 50 MG PO TABS: 50.0000 mg | ORAL_TABLET | Freq: Two times a day (BID) | ORAL | 1 refills | Status: DC

## 2018-02-28 MED ORDER — BUPROPION HCL ER (SR) 150 MG PO TB12: ORAL_TABLET | ORAL | 3 refills | Status: DC

## 2018-02-28 NOTE — Patient Instructions (Addendum)
Headaches: topamax to 50 mg twice a day. Start with increase to 50mg  at night for three days, then 50mg  twice a day.  Depression: continue lexapro. Start buproprion, take 150mg  once a day for 3 days, then increase to twice a day.   [facility], [facility] (consulting) Health Contact: [phone]

## 2018-02-28 NOTE — Progress Notes (Signed)
  Subjective:   Patient ID: Brittney Farmer, female    DOB: June 05, 1985, 32 y.o.   MRN: [id number] CC: Medical Management of Chronic Issues  HPI: Brittney Farmer is a 32 y.o. female   Headaches: still getting 2-4 times a week. Photosensitive, usually morning or afternoon, during the day. Sometimes in evening.  Sometimes feels nauseous. Laying down helps with the headache. No triggers that she knows of. Getting 6 hours of sleep a night.  Elevated BMI: getting up at 3am to go to the gym. Restarted carbs for month of September, now off. Off of phentermine. Weight down to 189lbs at home a month ago, then with some weight gain in September. Now back to avoiding carbohydrates.   Depression: tried Zoloft, alprazolam, cymbalta in the past without benefit or with side effects. Now on lexapro with buspirone and propranolol to help with anxiety. This has been the best combination she has been on in a while. Recently mood is down more, harder time feeling motivated. Going through start of divorce.   Relevant past medical, surgical, family and social history reviewed. Allergies and medications reviewed and updated. Social History   Tobacco Use  Smoking Status Never Smoker  Smokeless Tobacco Never Used   ROS: Per HPI   Objective:    BP 117/76   Pulse 75   Temp (!) 97.4 F (36.3 C) (Oral)   Ht 5\' 4"  (1.626 m)   Wt 200 lb 6.4 oz (90.9 kg)   BMI 34.40 kg/m   Wt Readings from Last 3 Encounters:  02/28/18 200 lb 6.4 oz (90.9 kg)  11/22/17 201 lb (91.2 kg)  10/02/17 204 lb (92.5 kg)   Gen: NAD, alert, cooperative with exam, NCAT EYES: EOMI, no conjunctival injection, or no icterus CV: NRRR, normal S1/S2, no murmur, distal pulses 2+ b/l Resp: CTABL, no wheezes, normal WOB Abd: +BS, soft, NTND. no guarding or organomegaly Ext: No edema, warm Neuro: Alert and oriented, strength equal b/l UE and LE, coordination grossly normal MSK: normal muscle bulk  Assessment & Plan:  Brittney Farmer was seen today for  medical management of chronic issues.  Diagnoses and all orders for this visit:  Other migraine without status migrainosus, not intractable Discussed sleep hygiene, increase topamax to 50mg  BID, use below as needed, no more than twice a week -     rizatriptan (MAXALT) 10 MG tablet; Take 1 tablet (10 mg total) by mouth as needed for migraine. May repeat in 2 hours if needed -     topiramate (TOPAMAX) 50 MG tablet; Take 1 tablet (50 mg total) by mouth 2 (two) times daily. Start once a day for two weeks then twice a day  Depression, recurrent ([place]) Cont lexapro, start below to help with ongoing symptoms. -     buPROPion (WELLBUTRIN SR) 150 MG 12 hr tablet; Take once a day for 3 days, then take twice a day  BMI 34.0-34.9,adult Cont lifestyle changes, regular physical activity, decreasing sugary food intake.   Follow up plan: Return in about 3 months (around 05/31/2018). Assunta Found, MD [place]

## 2018-03-14 ENCOUNTER — Other Ambulatory Visit: Payer: Self-pay | Admitting: Pediatrics

## 2018-03-14 DIAGNOSIS — F339 Major depressive disorder, recurrent, unspecified: Secondary | ICD-10-CM

## 2018-03-19 MED ORDER — BUPROPION HCL ER (SR) 150 MG PO TB12: 150.0000 mg | ORAL_TABLET | Freq: Two times a day (BID) | ORAL | 0 refills | Status: DC

## 2018-03-19 NOTE — Addendum Note (Signed)
Addended by: Antonietta Barcelona D on: 03/19/2018 05:04 PM   Modules accepted: Orders

## 2018-03-19 NOTE — Telephone Encounter (Signed)
03/15/18 refill failed, resent

## 2018-04-04 ENCOUNTER — Telehealth: Payer: Self-pay | Admitting: Pediatrics

## 2018-04-05 NOTE — Telephone Encounter (Signed)
Pt scheduled with Dr Dettinger for IUD removal only 1/13 at 12:55.
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
## 2018-05-14 ENCOUNTER — Ambulatory Visit: Payer: BLUE CROSS/BLUE SHIELD | Admitting: Family Medicine

## 2018-05-31 ENCOUNTER — Ambulatory Visit: Payer: BLUE CROSS/BLUE SHIELD | Admitting: Nurse Practitioner

## 2018-05-31 ENCOUNTER — Ambulatory Visit: Payer: BLUE CROSS/BLUE SHIELD | Admitting: Pediatrics

## 2018-06-12 ENCOUNTER — Ambulatory Visit: Payer: BLUE CROSS/BLUE SHIELD | Admitting: Family

## 2018-06-16 ENCOUNTER — Other Ambulatory Visit: Payer: Self-pay | Admitting: Pediatrics

## 2018-06-16 DIAGNOSIS — F419 Anxiety disorder, unspecified: Secondary | ICD-10-CM

## 2018-06-21 ENCOUNTER — Ambulatory Visit (INDEPENDENT_AMBULATORY_CARE_PROVIDER_SITE_OTHER): Payer: BLUE CROSS/BLUE SHIELD | Admitting: Family Medicine

## 2018-06-21 ENCOUNTER — Encounter: Payer: Self-pay | Admitting: Family Medicine

## 2018-06-21 VITALS — BP 127/77 | HR 81 | Temp 97.7°F | Ht 64.0 in | Wt 221.0 lb

## 2018-06-21 DIAGNOSIS — N926 Irregular menstruation, unspecified: Secondary | ICD-10-CM | POA: Diagnosis not present

## 2018-06-21 DIAGNOSIS — G43809 Other migraine, not intractable, without status migrainosus: Secondary | ICD-10-CM | POA: Diagnosis not present

## 2018-06-21 DIAGNOSIS — F339 Major depressive disorder, recurrent, unspecified: Secondary | ICD-10-CM

## 2018-06-21 LAB — PREGNANCY, URINE: Preg Test, Ur: NEGATIVE

## 2018-06-21 MED ORDER — BUPROPION HCL ER (SR) 150 MG PO TB12: 150.0000 mg | ORAL_TABLET | Freq: Every day | ORAL | 3 refills | Status: DC

## 2018-06-21 MED ORDER — RIZATRIPTAN BENZOATE 10 MG PO TABS: 10.0000 mg | ORAL_TABLET | ORAL | 2 refills | Status: DC | PRN

## 2018-06-21 NOTE — Progress Notes (Signed)
BP 127/77   Pulse 81   Temp 97.7 F (36.5 C) (Oral)   Ht 5\' 4"  (1.626 m)   Wt 221 lb (100.2 kg)   BMI 37.93 kg/m    Subjective:    Patient ID: Brittney Farmer, female    DOB: 1986/04/19, 33 y.o.   MRN: [id number]  HPI: Brittney Farmer is a 33 y.o. female presenting on 06/21/2018 for Depression and Migraine   HPI Migraine headaches uncontrolled Patient comes in complaining of headaches and migraines that have been worse over the past few months.  She says she is getting them more frequently and more severe and they are lasting sometimes more than a day.  She is continued on her Topamax and propranolol and using Maxalt does not feel like they are helping anymore.  She says her headaches are in the front and behind her eyes where they always are and that has not changed just the frequency has changed.  Missed menstrual cycle, possible pregnancy Patient does had her IUD removed just over 2 months ago, she has not been actively trying to get pregnant but she has been sexually active with her boyfriend and not been not trying to get pregnant.  She says that her cycles should have been right around now or maybe a week from now but she wanted to come in and get checked.  Is want to come in today to discuss depression and says that her depression has been acting up more recently as well.  She says she has felt more down in her mood and has been taking the Lexapro it would discuss options to go with it, she has liked Wellbutrin in the past and would like to do that.  Patient denies any suicidal ideations Depression screen [facility] 2/9 06/21/2018 02/28/2018 11/22/2017 10/02/2017 08/24/2017  Decreased Interest 0 0 0 0 0  Down, Depressed, Hopeless 0 0 1 0 0  PHQ - 2 Score 0 0 1 0 0  Altered sleeping 1 - 2 - -  Tired, decreased energy 2 - 2 - -  Change in appetite 2 - 2 - -  Feeling bad or failure about yourself  0 - 0 - -  Trouble concentrating 0 - 0 - -  Moving slowly or fidgety/restless 0 - 2 - -    Suicidal thoughts 0 - 0 - -  PHQ-9 Score 5 - 9 - -  Difficult doing work/chores - - Somewhat difficult - -  Some recent data might be hidden     Relevant past medical, surgical, family and social history reviewed and updated as indicated. Interim medical history since our last visit reviewed. Allergies and medications reviewed and updated.  Review of Systems  Constitutional: Negative for chills and fever.  HENT: Negative for congestion, ear pain, sinus pressure and sinus pain.   Eyes: Positive for photophobia. Negative for visual disturbance.  Respiratory: Negative for chest tightness and shortness of breath.   Cardiovascular: Negative for chest pain and leg swelling.  Genitourinary: Positive for menstrual problem.  Musculoskeletal: Negative for back pain and gait problem.  Skin: Negative for rash.  Neurological: Positive for headaches. Negative for dizziness, weakness, light-headedness and numbness.  Psychiatric/Behavioral: Positive for decreased concentration and dysphoric mood. Negative for agitation, behavioral problems, self-injury, sleep disturbance and suicidal ideas. The patient is nervous/anxious.   All other systems reviewed and are negative.   Per HPI unless specifically indicated above   Allergies as of 06/21/2018   No Known Allergies  Medication List       Accurate as of June 21, 2018 11:59 PM. Always use your most recent med list.        buPROPion 150 MG 12 hr tablet Commonly known as:  WELLBUTRIN SR Take 1 tablet (150 mg total) by mouth daily.   busPIRone 15 MG tablet Commonly known as:  BUSPAR Take 1 tablet (15 mg total) by mouth 2 (two) times daily.   escitalopram 20 MG tablet Commonly known as:  LEXAPRO TAKE 1 TABLET BY MOUTH EVERY DAY   Galcanezumab-gnlm 120 MG/ML Soaj Commonly known as:  EMGALITY Inject 120 mg into the skin every 30 (thirty) days. Give 240 1st time and then 120 monthly   levonorgestrel 20 MCG/24HR IUD Commonly known  as:  MIRENA 1 each by Intrauterine route once. Nov 2017   omeprazole 40 MG capsule Commonly known as:  PRILOSEC Take 1 capsule (40 mg total) by mouth daily.   propranolol 10 MG tablet Commonly known as:  INDERAL Take 1 tablet (10 mg total) by mouth 3 (three) times daily as needed.   rizatriptan 10 MG tablet Commonly known as:  MAXALT Take 1 tablet (10 mg total) by mouth as needed for migraine. May repeat in 2 hours if needed   topiramate 50 MG tablet Commonly known as:  TOPAMAX Take 1 tablet (50 mg total) by mouth 2 (two) times daily. Start once a day for two weeks then twice a day          Objective:    BP 127/77   Pulse 81   Temp 97.7 F (36.5 C) (Oral)   Ht 5\' 4"  (1.626 m)   Wt 221 lb (100.2 kg)   BMI 37.93 kg/m   Wt Readings from Last 3 Encounters:  06/21/18 221 lb (100.2 kg)  02/28/18 200 lb 6.4 oz (90.9 kg)  11/22/17 201 lb (91.2 kg)    Physical Exam Vitals signs and nursing note reviewed.  Constitutional:      General: She is not in acute distress.    Appearance: She is well-developed. She is not diaphoretic.  Eyes:     Extraocular Movements: Extraocular movements intact.     Conjunctiva/sclera: Conjunctivae normal.     Pupils: Pupils are equal, round, and reactive to light.  Cardiovascular:     Rate and Rhythm: Normal rate and regular rhythm.     Heart sounds: Normal heart sounds. No murmur.  Pulmonary:     Effort: Pulmonary effort is normal. No respiratory distress.     Breath sounds: Normal breath sounds. No wheezing.  Musculoskeletal: Normal range of motion.        General: No tenderness.  Skin:    General: Skin is warm and dry.     Findings: No rash.  Neurological:     Mental Status: She is alert and oriented to person, place, and time.     Coordination: Coordination normal.  Psychiatric:        Mood and Affect: Mood is anxious and depressed.        Behavior: Behavior normal.        Thought Content: Thought content does not include suicidal  ideation.     Results for orders placed or performed in visit on 06/21/18  Pregnancy, urine  Result Value Ref Range   Preg Test, Ur Negative Negative      Assessment & Plan:   Problem List Items Addressed This Visit    None    Visit Diagnoses  Depression, recurrent ([place])    -  Primary   Relevant Medications   buPROPion (WELLBUTRIN SR) 150 MG 12 hr tablet   Other migraine without status migrainosus, not intractable       Relevant Medications   rizatriptan (MAXALT) 10 MG tablet   buPROPion (WELLBUTRIN SR) 150 MG 12 hr tablet   Galcanezumab-gnlm (EMGALITY) 120 MG/ML SOAJ   Missed menses       Relevant Orders   Pregnancy, urine (Completed)      Continue Wellbutrin for mood, will try Emgality if covered by insurance for her migraine headaches, urine pregnancy was negative so not currently pregnant but if she does and she will have to readdress both the Topamax and Emgality. Follow up plan: Return in about 3 months (around 09/19/2018), or if symptoms worsen or fail to improve, for depression and migraine recheck.  Counseling provided for all of the vaccine components Orders Placed This Encounter  Procedures  . Pregnancy, urine    Caryl Pina, MD [facility] 06/24/2018, 11:21 AM

## 2018-06-24 MED ORDER — GALCANEZUMAB-GNLM 120 MG/ML ~~LOC~~ SOAJ: 120.0000 mg | SUBCUTANEOUS | 3 refills | Status: DC

## 2018-06-25 ENCOUNTER — Telehealth: Payer: Self-pay | Admitting: Family Medicine

## 2018-09-16 ENCOUNTER — Other Ambulatory Visit: Payer: Self-pay | Admitting: Pediatrics

## 2018-09-16 DIAGNOSIS — F419 Anxiety disorder, unspecified: Secondary | ICD-10-CM

## 2018-09-17 ENCOUNTER — Other Ambulatory Visit: Payer: Self-pay | Admitting: Pediatrics

## 2018-09-17 DIAGNOSIS — G43809 Other migraine, not intractable, without status migrainosus: Secondary | ICD-10-CM

## 2018-09-20 ENCOUNTER — Ambulatory Visit: Payer: BLUE CROSS/BLUE SHIELD | Admitting: Family Medicine

## 2018-10-20 ENCOUNTER — Other Ambulatory Visit: Payer: Self-pay | Admitting: Family Medicine

## 2018-10-20 DIAGNOSIS — F419 Anxiety disorder, unspecified: Secondary | ICD-10-CM

## 2019-02-02 ENCOUNTER — Other Ambulatory Visit: Payer: Self-pay | Admitting: Family Medicine

## 2019-02-02 DIAGNOSIS — F419 Anxiety disorder, unspecified: Secondary | ICD-10-CM

## 2019-03-07 ENCOUNTER — Other Ambulatory Visit: Payer: Self-pay | Admitting: Family Medicine

## 2019-03-07 DIAGNOSIS — F419 Anxiety disorder, unspecified: Secondary | ICD-10-CM

## 2019-03-07 NOTE — Telephone Encounter (Signed)
Dettinger. NTBS 30 days given 10/5/202

## 2019-03-07 NOTE — Telephone Encounter (Signed)
Patient is currently pregnant and is not taking at this time

## 2019-03-13 HISTORY — PX: TUBAL LIGATION: SHX77

## 2019-04-13 ENCOUNTER — Other Ambulatory Visit: Payer: Self-pay | Admitting: Family Medicine

## 2019-04-13 DIAGNOSIS — F419 Anxiety disorder, unspecified: Secondary | ICD-10-CM

## 2019-04-15 NOTE — Telephone Encounter (Signed)
Dettinger. NTBS 30 days given 02/04/19

## 2019-06-13 ENCOUNTER — Telehealth: Payer: Self-pay | Admitting: Family Medicine

## 2019-06-13 NOTE — Telephone Encounter (Signed)
I do not know the reason why, I have only seen her once and that was in February 2020 and then she had a pregnancy since then, the only thing that I could think of is that she is having too much difficulty getting into me and maybe that is why she is switching but as far as I know she is a normal standard patient and I have not had any contact with her for over a year.  I am okay with the switching if you are comfortable with it, it may just be because she cannot get it to be but I have not heard anything else Caryl Pina, MD [facility] 06/13/2019, 3:11 PM

## 2019-06-13 NOTE — Telephone Encounter (Signed)
FYI only.

## 2019-06-13 NOTE — Telephone Encounter (Signed)
Seems ok for her to switch. You can have her make an appointment to see me.

## 2019-06-13 NOTE — Telephone Encounter (Signed)
Any insight as to why she would want to switch providers as I have never seen her in the past?  Brittney Farmer

## 2019-06-13 NOTE — Telephone Encounter (Signed)
Patient has a follow up appointment scheduled with new pcp,   [name].

## 2019-06-17 ENCOUNTER — Other Ambulatory Visit: Payer: Self-pay

## 2019-06-18 ENCOUNTER — Ambulatory Visit: Payer: Medicaid Other | Admitting: Family Medicine

## 2019-07-11 ENCOUNTER — Other Ambulatory Visit: Payer: Self-pay

## 2019-07-12 ENCOUNTER — Ambulatory Visit (INDEPENDENT_AMBULATORY_CARE_PROVIDER_SITE_OTHER): Payer: 59 | Admitting: Family Medicine

## 2019-07-12 ENCOUNTER — Encounter: Payer: Self-pay | Admitting: Family Medicine

## 2019-07-12 VITALS — BP 111/68 | HR 71 | Temp 98.6°F | Ht 64.0 in | Wt 235.5 lb

## 2019-07-12 DIAGNOSIS — G43809 Other migraine, not intractable, without status migrainosus: Secondary | ICD-10-CM

## 2019-07-12 DIAGNOSIS — G43909 Migraine, unspecified, not intractable, without status migrainosus: Secondary | ICD-10-CM | POA: Insufficient documentation

## 2019-07-12 DIAGNOSIS — F411 Generalized anxiety disorder: Secondary | ICD-10-CM | POA: Diagnosis not present

## 2019-07-12 DIAGNOSIS — K219 Gastro-esophageal reflux disease without esophagitis: Secondary | ICD-10-CM

## 2019-07-12 DIAGNOSIS — Z6841 Body Mass Index (BMI) 40.0 and over, adult: Secondary | ICD-10-CM

## 2019-07-12 DIAGNOSIS — F419 Anxiety disorder, unspecified: Secondary | ICD-10-CM

## 2019-07-12 DIAGNOSIS — F339 Major depressive disorder, recurrent, unspecified: Secondary | ICD-10-CM

## 2019-07-12 MED ORDER — TOPIRAMATE 50 MG PO TABS: 50.0000 mg | ORAL_TABLET | Freq: Two times a day (BID) | ORAL | 3 refills | Status: DC

## 2019-07-12 MED ORDER — RIZATRIPTAN BENZOATE 10 MG PO TABS: 10.0000 mg | ORAL_TABLET | ORAL | 0 refills | Status: DC | PRN

## 2019-07-12 MED ORDER — BUPROPION HCL ER (SR) 150 MG PO TB12: 150.0000 mg | ORAL_TABLET | Freq: Every day | ORAL | 3 refills | Status: DC

## 2019-07-12 MED ORDER — BUSPIRONE HCL 5 MG PO TABS: 5.0000 mg | ORAL_TABLET | Freq: Three times a day (TID) | ORAL | 6 refills | Status: DC

## 2019-07-12 NOTE — Progress Notes (Signed)
Subjective:  Patient ID: Brittney Farmer, female    DOB: Sep 09, 1985, 34 y.o.   MRN: [id number]  Patient Care Team: Baruch Gouty, FNP as PCP - General (Family Medicine)   Chief Complaint:  Medical Management of Chronic Issues and Depression   HPI: Brittney Farmer is a 34 y.o. female presenting on 07/12/2019 for Medical Management of Chronic Issues and Depression   1. Other migraine without status migrainosus, not intractable She reports that she was never able to get the emgality due to her insurance not approving the medication. She is now having migraines almost daily and Is not taking topamax or maxalt due to recent pregnancy, though she is not breastfeeding at this time. Has been taking tylenol and extra strength motrin with mild decrease in pain. She endorses photophobia and nausea associated with her migraines and does report occasionally having to miss work due to her migraines.  2. Depression, unspecified depression type     GAD (generalized anxiety disorder) Recently had a baby in November and has noticed an increase in her anxiety since then. She is currently on lexapro 40 mg BID, and states that she feels like the medication "takes the edge off" of her symptoms but she still endorses 2-3 panic attacks/month as well as outbursts of anger at times. She states she feels frustrated very easily, especially with her children and at work. She has used PRN anxiety medication in the past with good result and is open to trying a medication to treat acute anxiety attacks.   3. Obesity Reports losing 40 pounds prior to her last baby by doing the keto diet. She states that since having her baby, she has tried the keto diet again, but cannot seem to lose the weight. She reports she has used phentermine in the past with good result.   Relevant past medical, surgical, family, and social history reviewed and updated as indicated.  Allergies and medications reviewed and updated. Date reviewed:  Chart in Epic.   Past Medical History:  Diagnosis Date  . IBS (irritable bowel syndrome)     Past Surgical History:  Procedure Laterality Date  . HAND SURGERY Left 09/08/2016   [facility]    Social History   Socioeconomic History  . Marital status: Single    Spouse name: Not on file  . Number of children: Not on file  . Years of education: Not on file  . Highest education level: Not on file  Occupational History  . Not on file  Tobacco Use  . Smoking status: Never Smoker  . Smokeless tobacco: Never Used  Substance and Sexual Activity  . Alcohol use: No  . Drug use: No  . Sexual activity: Yes  Other Topics Concern  . Not on file  Social History Narrative  . Not on file   Social Determinants of Health   Financial Resource Strain:   . Difficulty of Paying Living Expenses:   Food Insecurity:   . Worried About Charity fundraiser in the Last Year:   . Arboriculturist in the Last Year:   Transportation Needs:   . Film/video editor (Medical):   Marland Kitchen Lack of Transportation (Non-Medical):   Physical Activity:   . Days of Exercise per Week:   . Minutes of Exercise per Session:   Stress:   . Feeling of Stress :   Social Connections:   . Frequency of Communication with Friends and Family:   . Frequency of Social Gatherings with  Friends and Family:   . Attends Religious Services:   . Active Member of Clubs or Organizations:   . Attends Archivist Meetings:   Marland Kitchen Marital Status:   Intimate Partner Violence:   . Fear of Current or Ex-Partner:   . Emotionally Abused:   Marland Kitchen Physically Abused:   . Sexually Abused:     Outpatient Encounter Medications as of 07/12/2019  Medication Sig  . escitalopram (LEXAPRO) 20 MG tablet Take 1 tablet (20 mg total) by mouth daily. (Needs to be seen before next refill)  . Galcanezumab-gnlm (EMGALITY) 120 MG/ML SOAJ Inject 120 mg into the skin every 30 (thirty) days. Give 240 1st time and then 120 monthly  . levonorgestrel (MIRENA)  20 MCG/24HR IUD 1 each by Intrauterine route once. Nov 2017  . omeprazole (PRILOSEC) 40 MG capsule Take 1 capsule (40 mg total) by mouth daily.  . propranolol (INDERAL) 10 MG tablet Take 1 tablet (10 mg total) by mouth 3 (three) times daily as needed.  . rizatriptan (MAXALT) 10 MG tablet Take 1 tablet (10 mg total) by mouth as needed for migraine. May repeat in 2 hours if needed  . topiramate (TOPAMAX) 50 MG tablet TAKE 1 TABLET DAILY FOR 2 WEEKS, THEN INCREASE TO 1 TABLET TWICE A DAY  . [DISCONTINUED] buPROPion (WELLBUTRIN SR) 150 MG 12 hr tablet Take 1 tablet (150 mg total) by mouth daily.  . [DISCONTINUED] busPIRone (BUSPAR) 15 MG tablet Take 1 tablet (15 mg total) by mouth 2 (two) times daily.   No facility-administered encounter medications on file as of 07/12/2019.    No Known Allergies  Review of Systems  Constitutional: Negative for activity change, fatigue and unexpected weight change.  HENT: Negative.   Eyes: Negative.   Respiratory: Negative for chest tightness and shortness of breath.   Cardiovascular: Negative for chest pain, palpitations and leg swelling.  Gastrointestinal: Negative for abdominal pain, constipation, diarrhea and nausea.  Endocrine: Negative for cold intolerance, heat intolerance, polydipsia and polyphagia.  Genitourinary: Negative for decreased urine volume, difficulty urinating, frequency, vaginal bleeding, vaginal discharge and vaginal pain.  Musculoskeletal: Negative for gait problem and myalgias.  Skin: Negative for color change.  Allergic/Immunologic: Negative.   Neurological: Negative for dizziness, weakness and numbness.  Hematological: Negative.   Psychiatric/Behavioral: Positive for agitation. Negative for behavioral problems, self-injury, sleep disturbance and suicidal ideas. The patient is nervous/anxious.   All other systems reviewed and are negative.    Objective:  Ht 5\' 4"  (1.626 m)   Wt 235 lb 8 oz (106.8 kg)   BMI 40.42 kg/m    Wt  Readings from Last 3 Encounters:  07/12/19 235 lb 8 oz (106.8 kg)  06/21/18 221 lb (100.2 kg)  02/28/18 200 lb 6.4 oz (90.9 kg)   Physical Exam  Constitutional: She is oriented to person, place, and time. She appears well-developed and well-nourished.  HENT:  Head: Normocephalic and atraumatic.  Right Ear: External ear normal.  Left Ear: External ear normal.  Eyes: Pupils are equal, round, and reactive to light.  Cardiovascular: Normal rate, regular rhythm and normal heart sounds.  Pulmonary/Chest: Effort normal and breath sounds normal. No respiratory distress.  Abdominal: Soft. Bowel sounds are normal.  Musculoskeletal:        General: Normal range of motion.     Cervical back: Normal range of motion and neck supple.  Neurological: She is alert and oriented to person, place, and time. She has normal reflexes.  Skin: Skin is warm and dry.  Psychiatric: She has a normal mood and affect. Her behavior is normal. Judgment and thought content normal.  Nursing note and vitals reviewed.   Results for orders placed or performed in visit on 06/21/18  Pregnancy, urine  Result Value Ref Range   Preg Test, Ur Negative Negative       Pertinent labs & imaging results that were available during my care of the patient were reviewed by me and considered in my medical decision making.  Assessment & Plan:  Harlo was seen today for medical management of chronic issues and depression.  Diagnoses and all orders for this visit:  Other migraine without status migrainosus, not intractable will attempt to get emgality approved by insurance. Will restart topamax and maxalt until emgality is approved, can continue these if insurance does not approve emgality.   Depression, unspecified depression type GAD (generalized anxiety disorder) Educated on deep breathing and guided mediation during anxiety attacks.  Continue lexapro 40mg  daily. Rx buspar 5mg  TID PRN for acute attacks. Restart Wellbutrin.    Obesity Discussed contraindications of using phentermine with her increased anxiety. Will need to get anxiety under control prior to considering stimulants for weight loss. Discussed continuing exercise and health eating habits.    Continue all other maintenance medications.  Follow up plan: Return in about 6 months (around 01/12/2020), or if symptoms worsen or fail to improve.  Continue healthy lifestyle choices, including diet (rich in fruits, vegetables, and lean proteins, and low in salt and simple carbohydrates) and exercise (at least 30 minutes of moderate physical activity daily).    The above assessment and management plan was discussed with the patient. The patient verbalized understanding of and has agreed to the management plan. Patient is aware to call the clinic if they develop any new symptoms or if symptoms persist or worsen. Patient is aware when to return to the clinic for a follow-up visit. Patient educated on when it is appropriate to go to the emergency department.   Scherrie Gerlach, BSN, RN, AGNP-Student   I personally was present during the history, physical exam, and medical decision-making activities of this service and have verified that the service and findings are accurately documented in the nurse practitioner student's note.  Monia Pouch, FNP-C [facility] [address] [place], [place] [address] [phone]

## 2019-07-12 NOTE — Patient Instructions (Signed)

## 2019-07-13 LAB — COMPREHENSIVE METABOLIC PANEL WITH GFR
ALT: 8 IU/L (ref 0–32)
AST: 11 IU/L (ref 0–40)
Albumin/Globulin Ratio: 1.7 (ref 1.2–2.2)
Albumin: 4.5 g/dL (ref 3.8–4.8)
Alkaline Phosphatase: 99 IU/L (ref 39–117)
BUN/Creatinine Ratio: 25 — ABNORMAL HIGH (ref 9–23)
BUN: 16 mg/dL (ref 6–20)
Bilirubin Total: 0.3 mg/dL (ref 0.0–1.2)
CO2: 22 mmol/L (ref 20–29)
Calcium: 9.2 mg/dL (ref 8.7–10.2)
Chloride: 102 mmol/L (ref 96–106)
Creatinine, Ser: 0.65 mg/dL (ref 0.57–1.00)
GFR calc Af Amer: 137 mL/min/1.73 (ref >59)
GFR calc non Af Amer: 119 mL/min/1.73 (ref >59)
Globulin, Total: 2.7 g/dL (ref 1.5–4.5)
Glucose: 75 mg/dL (ref 65–99)
Potassium: 4.3 mmol/L (ref 3.5–5.2)
Sodium: 139 mmol/L (ref 134–144)
Total Protein: 7.2 g/dL (ref 6.0–8.5)

## 2019-07-13 LAB — THYROID PANEL WITH TSH
Free Thyroxine Index: 1.6 (ref 1.2–4.9)
T3 Uptake Ratio: 22 % — ABNORMAL LOW (ref 24–39)
T4, Total: 7.1 ug/dL (ref 4.5–12.0)
TSH: 1.44 u[IU]/mL (ref 0.450–4.500)

## 2019-07-15 MED ORDER — ESCITALOPRAM OXALATE 20 MG PO TABS: 20.0000 mg | ORAL_TABLET | Freq: Every day | ORAL | 2 refills | Status: DC

## 2019-07-15 MED ORDER — OMEPRAZOLE 40 MG PO CPDR: 40.0000 mg | DELAYED_RELEASE_CAPSULE | Freq: Every day | ORAL | 2 refills | Status: DC

## 2019-07-15 NOTE — Addendum Note (Signed)
Addended by: Baldomero Lamy B on: 07/15/2019 03:07 PM   Modules accepted: Orders

## 2019-08-06 ENCOUNTER — Other Ambulatory Visit: Payer: Self-pay | Admitting: Family Medicine

## 2019-08-06 DIAGNOSIS — F411 Generalized anxiety disorder: Secondary | ICD-10-CM

## 2019-08-07 ENCOUNTER — Other Ambulatory Visit: Payer: Self-pay | Admitting: Family Medicine

## 2019-08-07 DIAGNOSIS — G43809 Other migraine, not intractable, without status migrainosus: Secondary | ICD-10-CM

## 2019-08-08 ENCOUNTER — Encounter: Payer: Self-pay | Admitting: Family Medicine

## 2019-08-08 ENCOUNTER — Ambulatory Visit (INDEPENDENT_AMBULATORY_CARE_PROVIDER_SITE_OTHER): Payer: 59 | Admitting: Family Medicine

## 2019-08-08 ENCOUNTER — Other Ambulatory Visit: Payer: Self-pay

## 2019-08-08 VITALS — BP 115/74 | HR 77 | Temp 97.1°F | Ht 64.0 in | Wt 242.4 lb

## 2019-08-08 DIAGNOSIS — K219 Gastro-esophageal reflux disease without esophagitis: Secondary | ICD-10-CM | POA: Diagnosis not present

## 2019-08-08 DIAGNOSIS — F411 Generalized anxiety disorder: Secondary | ICD-10-CM | POA: Diagnosis not present

## 2019-08-08 DIAGNOSIS — Z6841 Body Mass Index (BMI) 40.0 and over, adult: Secondary | ICD-10-CM | POA: Diagnosis not present

## 2019-08-08 DIAGNOSIS — F339 Major depressive disorder, recurrent, unspecified: Secondary | ICD-10-CM

## 2019-08-08 DIAGNOSIS — Z7689 Persons encountering health services in other specified circumstances: Secondary | ICD-10-CM

## 2019-08-08 MED ORDER — OMEPRAZOLE 40 MG PO CPDR: 40.0000 mg | DELAYED_RELEASE_CAPSULE | Freq: Every day | ORAL | 2 refills | Status: DC

## 2019-08-08 MED ORDER — BUSPIRONE HCL 5 MG PO TABS: 5.0000 mg | ORAL_TABLET | Freq: Three times a day (TID) | ORAL | 3 refills | Status: DC

## 2019-08-08 MED ORDER — BUPROPION HCL ER (SR) 150 MG PO TB12: 150.0000 mg | ORAL_TABLET | Freq: Every day | ORAL | 3 refills | Status: DC

## 2019-08-08 MED ORDER — ESCITALOPRAM OXALATE 20 MG PO TABS: 20.0000 mg | ORAL_TABLET | Freq: Every day | ORAL | 3 refills | Status: DC

## 2019-08-08 MED ORDER — PHENTERMINE HCL 37.5 MG PO CAPS: 37.5000 mg | ORAL_CAPSULE | ORAL | 2 refills | Status: DC

## 2019-08-08 NOTE — Patient Instructions (Signed)
If your symptoms worsen or you have thoughts of suicide/homicide, PLEASE SEEK IMMEDIATE MEDICAL ATTENTION.  You may always call the National Suicide Hotline.  This is available 24 hours a day, 7 days a week.  Their number is: 1-800-273-8255  Taking the medicine as directed and not missing any doses is one of the best things you can do to treat your depression.  Here are some things to keep in mind:  1) Side effects (stomach upset, some increased anxiety) may happen before you notice a benefit.  These side effects typically go away over time. 2) Changes to your dose of medicine or a change in medication all together is sometimes necessary 3) Most people need to be on medication at least 12 months 4) Many people will notice an improvement within two weeks but the full effect of the medication can take up to 4-6 weeks 5) Stopping the medication when you start feeling better often results in a return of symptoms 6) Never discontinue your medication without contacting a health care professional first.  Some medications require gradual discontinuation/ taper and can make you sick if you stop them abruptly.  If your symptoms worsen or you have thoughts of suicide/homicide, PLEASE SEEK IMMEDIATE MEDICAL ATTENTION.  You may always call:  National Suicide Hotline: 800-273-8255 [place] Crisis Line: 336-832-9700 Crisis Recovery in [place]: 800-939-5911   These are available 24 hours a day, 7 days a week.   

## 2019-08-08 NOTE — Progress Notes (Signed)
Subjective:  Patient ID: Brittney Farmer, female    DOB: 05/29/1985, 34 y.o.   MRN: [id number]  Patient Care Team: Sharion Balloon, FNP as PCP - General (Family Medicine)   Chief Complaint:  Medical Management of Chronic Issues and Depression   HPI: Brittney Farmer is a 34 y.o. female presenting on 08/08/2019 for Medical Management of Chronic Issues and Depression   1. GAD (generalized anxiety disorder) 2. Depression, recurrent ([place]) Pt states she feels much better since restarting her medications. States her anxiety has decreased a lot and she is more at ease. States she is sleeping better but still feels tired at times and is eating more than usual. No agitation, SI, or HI.  Depression screen [facility] 2/9 08/08/2019 07/12/2019 06/21/2018 02/28/2018 11/22/2017  Decreased Interest 0 2 0 0 0  Down, Depressed, Hopeless 1 2 0 0 1  PHQ - 2 Score 1 4 0 0 1  Altered sleeping 1 2 1  - 2  Tired, decreased energy 3 3 2  - 2  Change in appetite 3 1 2  - 2  Feeling bad or failure about yourself  0 2 0 - 0  Trouble concentrating 3 0 0 - 0  Moving slowly or fidgety/restless 0 2 0 - 2  Suicidal thoughts 0 1 0 - 0  PHQ-9 Score 11 15 5  - 9  Difficult doing work/chores Not difficult at all - - - Somewhat difficult  Some recent data might be hidden   GAD 7 : Generalized Anxiety Score 08/08/2019 07/12/2019 02/21/2017 06/15/2016  Nervous, Anxious, on Edge 1 3 3 3   Control/stop worrying 0 3 3 3   Worry too much - different things 1 3 3 3   Trouble relaxing 1 3 3 3   Restless 0 3 3 3   Easily annoyed or irritable 1 3 3 3   Afraid - awful might happen 1 3 2 2   Total GAD 7 Score 5 21 20 20   Anxiety Difficulty Not difficult at all - Very difficult Very difficult    3. Gastroesophageal reflux disease without esophagitis On omeprazole and tolerating well. No cough, voice change, dysphagia, dyspepsia, hemoptysis, melena, or hematochezia.   4. BMI 40.0-44.9, adult ([place]) Pt states she has changed her diet and has been  trying to exercise more. States her appetite has increased and she can not seem to drop the extra weight. She has started drinking more water. Does not follow a strict diet.     Relevant past medical, surgical, family, and social history reviewed and updated as indicated.  Allergies and medications reviewed and updated. Date reviewed: Chart in Epic.   Past Medical History:  Diagnosis Date  . IBS (irritable bowel syndrome)     Past Surgical History:  Procedure Laterality Date  . HAND SURGERY Left 09/08/2016   [place]  . TUBAL LIGATION  03/13/2019  . VAGINAL DELIVERY  03/13/2019    Social History   Socioeconomic History  . Marital status: Single    Spouse name: Not on file  . Number of children: Not on file  . Years of education: Not on file  . Highest education level: Not on file  Occupational History  . Not on file  Tobacco Use  . Smoking status: Never Smoker  . Smokeless tobacco: Never Used  Substance and Sexual Activity  . Alcohol use: No  . Drug use: No  . Sexual activity: Yes  Other Topics Concern  . Not on file  Social History Narrative  . Not on  file   Social Determinants of Health   Financial Resource Strain:   . Difficulty of Paying Living Expenses:   Food Insecurity:   . Worried About Charity fundraiser in the Last Year:   . Arboriculturist in the Last Year:   Transportation Needs:   . Film/video editor (Medical):   Marland Kitchen Lack of Transportation (Non-Medical):   Physical Activity:   . Days of Exercise per Week:   . Minutes of Exercise per Session:   Stress:   . Feeling of Stress :   Social Connections:   . Frequency of Communication with Friends and Family:   . Frequency of Social Gatherings with Friends and Family:   . Attends Religious Services:   . Active Member of Clubs or Organizations:   . Attends Archivist Meetings:   Marland Kitchen Marital Status:   Intimate Partner Violence:   . Fear of Current or Ex-Partner:   . Emotionally Abused:    Marland Kitchen Physically Abused:   . Sexually Abused:     Outpatient Encounter Medications as of 08/08/2019  Medication Sig  . buPROPion (WELLBUTRIN SR) 150 MG 12 hr tablet Take 1 tablet (150 mg total) by mouth daily.  . busPIRone (BUSPAR) 5 MG tablet Take 1 tablet (5 mg total) by mouth 3 (three) times daily.  Marland Kitchen escitalopram (LEXAPRO) 20 MG tablet Take 1 tablet (20 mg total) by mouth daily. (Needs to be seen before next refill)  . omeprazole (PRILOSEC) 40 MG capsule Take 1 capsule (40 mg total) by mouth daily.  . rizatriptan (MAXALT) 10 MG tablet TAKE 1 TABLET BY MOUTH AS NEEDED FOR MIGRAINE. MAY REPEAT IN 2 HOURS IF NEEDED  . topiramate (TOPAMAX) 50 MG tablet Take 1 tablet (50 mg total) by mouth 2 (two) times daily.  . [DISCONTINUED] buPROPion (WELLBUTRIN SR) 150 MG 12 hr tablet Take 1 tablet (150 mg total) by mouth daily.  . [DISCONTINUED] busPIRone (BUSPAR) 5 MG tablet TAKE 1 TABLET BY MOUTH THREE TIMES A DAY  . [DISCONTINUED] escitalopram (LEXAPRO) 20 MG tablet Take 1 tablet (20 mg total) by mouth daily. (Needs to be seen before next refill)  . [DISCONTINUED] omeprazole (PRILOSEC) 40 MG capsule Take 1 capsule (40 mg total) by mouth daily.  . phentermine 37.5 MG capsule Take 1 capsule (37.5 mg total) by mouth every morning.   No facility-administered encounter medications on file as of 08/08/2019.    No Known Allergies  Review of Systems  Constitutional: Positive for appetite change and fatigue. Negative for activity change, chills, diaphoresis, fever and unexpected weight change.  HENT: Negative.  Negative for sore throat, trouble swallowing and voice change.   Eyes: Negative.   Respiratory: Negative for cough, choking, chest tightness and shortness of breath.   Cardiovascular: Negative for chest pain, palpitations and leg swelling.  Gastrointestinal: Negative for abdominal pain, blood in stool, constipation, diarrhea, nausea and vomiting.  Endocrine: Negative.   Genitourinary: Negative for  decreased urine volume, difficulty urinating, dysuria, frequency and urgency.  Musculoskeletal: Negative for arthralgias and myalgias.  Skin: Negative.   Allergic/Immunologic: Negative.   Neurological: Negative for dizziness, tremors, seizures, syncope, facial asymmetry, speech difficulty, weakness, light-headedness, numbness and headaches.  Hematological: Negative.   Psychiatric/Behavioral: Positive for agitation, decreased concentration, dysphoric mood and sleep disturbance. Negative for behavioral problems, confusion, hallucinations, self-injury and suicidal ideas. The patient is nervous/anxious. The patient is not hyperactive.   All other systems reviewed and are negative.       Objective:  BP 115/74   Pulse 77   Temp (!) 97.1 F (36.2 C)   Ht 5\' 4"  (1.626 m)   Wt 242 lb 6.4 oz (110 kg)   LMP 07/20/2019   SpO2 98%   BMI 41.61 kg/m    Wt Readings from Last 3 Encounters:  08/08/19 242 lb 6.4 oz (110 kg)  07/12/19 235 lb 8 oz (106.8 kg)  06/21/18 221 lb (100.2 kg)    Physical Exam Vitals and nursing note reviewed.  Constitutional:      General: She is not in acute distress.    Appearance: Normal appearance. She is well-developed and well-groomed. She is morbidly obese. She is not ill-appearing, toxic-appearing or diaphoretic.  HENT:     Head: Normocephalic and atraumatic.     Jaw: There is normal jaw occlusion.     Right Ear: Hearing normal.     Left Ear: Hearing normal.     Nose: Nose normal.     Mouth/Throat:     Lips: Pink.     Mouth: Mucous membranes are moist.     Pharynx: Oropharynx is clear. Uvula midline.  Eyes:     General: Lids are normal.     Extraocular Movements: Extraocular movements intact.     Conjunctiva/sclera: Conjunctivae normal.     Pupils: Pupils are equal, round, and reactive to light.  Neck:     Thyroid: No thyroid mass, thyromegaly or thyroid tenderness.     Vascular: No carotid bruit or JVD.     Trachea: Trachea and phonation normal.   Cardiovascular:     Rate and Rhythm: Normal rate and regular rhythm.     Chest Wall: PMI is not displaced.     Pulses: Normal pulses.     Heart sounds: Normal heart sounds. No murmur. No friction rub. No gallop.   Pulmonary:     Effort: Pulmonary effort is normal. No respiratory distress.     Breath sounds: Normal breath sounds. No wheezing.  Abdominal:     General: Bowel sounds are normal. There is no distension or abdominal bruit.     Palpations: Abdomen is soft. There is no hepatomegaly or splenomegaly.     Tenderness: There is no abdominal tenderness. There is no right CVA tenderness or left CVA tenderness.     Hernia: No hernia is present.  Musculoskeletal:        General: Normal range of motion.     Cervical back: Normal range of motion and neck supple.     Right lower leg: No edema.     Left lower leg: No edema.  Lymphadenopathy:     Cervical: No cervical adenopathy.  Skin:    General: Skin is warm and dry.     Capillary Refill: Capillary refill takes less than 2 seconds.     Coloration: Skin is not cyanotic, jaundiced or pale.     Findings: No rash.  Neurological:     General: No focal deficit present.     Mental Status: She is alert and oriented to person, place, and time.     Cranial Nerves: Cranial nerves are intact. No cranial nerve deficit.     Sensory: Sensation is intact. No sensory deficit.     Motor: Motor function is intact. No weakness.     Coordination: Coordination is intact. Coordination normal.     Gait: Gait is intact. Gait normal.     Deep Tendon Reflexes: Reflexes are normal and symmetric. Reflexes normal.  Psychiatric:  Attention and Perception: Attention and perception normal.        Mood and Affect: Mood and affect normal.        Speech: Speech normal.        Behavior: Behavior normal. Behavior is cooperative.        Thought Content: Thought content normal.        Cognition and Memory: Cognition and memory normal.        Judgment: Judgment  normal.     Results for orders placed or performed in visit on 07/12/19  Thyroid Panel With TSH  Result Value Ref Range   TSH 1.440 0.450 - 4.500 uIU/mL   T4, Total 7.1 4.5 - 12.0 ug/dL   T3 Uptake Ratio 22 (L) 24 - 39 %   Free Thyroxine Index 1.6 1.2 - 4.9  Comprehensive metabolic panel  Result Value Ref Range   Glucose 75 65 - 99 mg/dL   BUN 16 6 - 20 mg/dL   Creatinine, Ser 0.65 0.57 - 1.00 mg/dL   GFR calc non Af Amer 119 >59 mL/min/1.73   GFR calc Af Amer 137 >59 mL/min/1.73   BUN/Creatinine Ratio 25 (H) 9 - 23   Sodium 139 134 - 144 mmol/L   Potassium 4.3 3.5 - 5.2 mmol/L   Chloride 102 96 - 106 mmol/L   CO2 22 20 - 29 mmol/L   Calcium 9.2 8.7 - 10.2 mg/dL   Total Protein 7.2 6.0 - 8.5 g/dL   Albumin 4.5 3.8 - 4.8 g/dL   Globulin, Total 2.7 1.5 - 4.5 g/dL   Albumin/Globulin Ratio 1.7 1.2 - 2.2   Bilirubin Total 0.3 0.0 - 1.2 mg/dL   Alkaline Phosphatase 99 39 - 117 IU/L   AST 11 0 - 40 IU/L   ALT 8 0 - 32 IU/L       Pertinent labs & imaging results that were available during my care of the patient were reviewed by me and considered in my medical decision making.  Assessment & Plan:  Brittney Farmer was seen today for medical management of chronic issues and depression.  Diagnoses and all orders for this visit:  GAD (generalized anxiety disorder) Depression, recurrent ([place]) Doing well on below, will continue.  -     busPIRone (BUSPAR) 5 MG tablet; Take 1 tablet (5 mg total) by mouth 3 (three) times daily. -     buPROPion (WELLBUTRIN SR) 150 MG 12 hr tablet; Take 1 tablet (150 mg total) by mouth daily. -     escitalopram (LEXAPRO) 20 MG tablet; Take 1 tablet (20 mg total) by mouth daily. (Needs to be seen before next refill)   Gastroesophageal reflux disease without esophagitis No red flags present. Diet discussed. Avoid fried, spicy, fatty, greasy, and acidic foods. Avoid caffeine, nicotine, and alcohol. Do not eat 2-3 hours before bedtime and stay upright for at least  1-2 hours after eating. Eat small frequent meals. Avoid NSAID's like motrin and aleve. Medications as prescribed. Report any new or worsening symptoms. Follow up as discussed or sooner if needed.   -     omeprazole (PRILOSEC) 40 MG capsule; Take 1 capsule (40 mg total) by mouth daily.  BMI 40.0-44.9, adult ([place]) Encounter for weight management Long discussion about diet and exercise for weight management. Pt aware she has to commit to lifestyle changes in order for weight loss to be successful. Will give 3 month supply of below to help start weight loss journey. Pt aware she will have to follow  up in 3 months for reevaluation. Pt will need a 1 month drug holiday to see if she is committed to the needed lifestyle changes needed to manage weight. Pt agrees to these terms. Will give below. Follow up in 3 months.  -     phentermine 37.5 MG capsule; Take 1 capsule (37.5 mg total) by mouth every morning.     Continue all other maintenance medications.  Follow up plan: Return in about 3 months (around 11/07/2019), or if symptoms worsen or fail to improve, for depression.   Continue healthy lifestyle choices, including diet (rich in fruits, vegetables, and lean proteins, and low in salt and simple carbohydrates) and exercise (at least 30 minutes of moderate physical activity daily).  Educational handout given for depression  The above assessment and management plan was discussed with the patient. The patient verbalized understanding of and has agreed to the management plan. Patient is aware to call the clinic if they develop any new symptoms or if symptoms persist or worsen. Patient is aware when to return to the clinic for a follow-up visit. Patient educated on when it is appropriate to go to the emergency department.   Monia Pouch, FNP-C [facility] [phone]

## 2019-09-04 ENCOUNTER — Other Ambulatory Visit: Payer: Self-pay | Admitting: *Deleted

## 2019-09-04 DIAGNOSIS — G43809 Other migraine, not intractable, without status migrainosus: Secondary | ICD-10-CM

## 2019-09-04 MED ORDER — RIZATRIPTAN BENZOATE 10 MG PO TABS: ORAL_TABLET | ORAL | 1 refills | Status: DC

## 2019-10-07 ENCOUNTER — Other Ambulatory Visit: Payer: Self-pay | Admitting: *Deleted

## 2019-10-07 DIAGNOSIS — G43809 Other migraine, not intractable, without status migrainosus: Secondary | ICD-10-CM

## 2019-10-07 MED ORDER — TOPIRAMATE 50 MG PO TABS: 50.0000 mg | ORAL_TABLET | Freq: Two times a day (BID) | ORAL | 0 refills | Status: DC

## 2019-10-11 ENCOUNTER — Telehealth (INDEPENDENT_AMBULATORY_CARE_PROVIDER_SITE_OTHER): Payer: 59 | Admitting: Family Medicine

## 2019-10-11 ENCOUNTER — Encounter: Payer: Self-pay | Admitting: Family Medicine

## 2019-10-11 DIAGNOSIS — J302 Other seasonal allergic rhinitis: Secondary | ICD-10-CM

## 2019-10-11 DIAGNOSIS — L255 Unspecified contact dermatitis due to plants, except food: Secondary | ICD-10-CM

## 2019-10-11 MED ORDER — LORATADINE 10 MG PO TABS: 10.0000 mg | ORAL_TABLET | Freq: Every day | ORAL | 2 refills | Status: AC

## 2019-10-11 MED ORDER — FLUTICASONE PROPIONATE 50 MCG/ACT NA SUSP: 2.0000 | Freq: Every day | NASAL | 6 refills | Status: AC

## 2019-10-11 MED ORDER — PREDNISONE 10 MG (21) PO TBPK: ORAL_TABLET | ORAL | 0 refills | Status: DC

## 2019-10-11 NOTE — Progress Notes (Signed)
Virtual Visit via Video note  I connected with Brittney Farmer on 10/11/19 at 5:27 PM by video and verified that I am speaking with the correct person using two identifiers. Brittney Farmer is currently located in her car and nobody is currently with her during visit. The provider, Loman Brooklyn, FNP is located in their office at time of visit.  I discussed the limitations, risks, security and privacy concerns of performing an evaluation and management service by video and the availability of in person appointments. I also discussed with the patient that there may be a patient responsible charge related to this service. The patient expressed understanding and agreed to proceed.  Subjective: PCP: Brittney Balloon, FNP  Chief Complaint  Patient presents with   Rash   Patient reports poison oak to the right side of her face, left arm, under her breasts, bilateral ankles and bilateral knees.  She has been applying calamine lotion.  She is also experiencing cough, runny nose, and sore throat related to her allergies.   ROS: Per HPI  Current Outpatient Medications:    buPROPion (WELLBUTRIN SR) 150 MG 12 hr tablet, Take 1 tablet (150 mg total) by mouth daily., Disp: 90 tablet, Rfl: 3   busPIRone (BUSPAR) 5 MG tablet, Take 1 tablet (5 mg total) by mouth 3 (three) times daily., Disp: 270 tablet, Rfl: 3   escitalopram (LEXAPRO) 20 MG tablet, Take 1 tablet (20 mg total) by mouth daily. (Needs to be seen before next refill), Disp: 90 tablet, Rfl: 3   omeprazole (PRILOSEC) 40 MG capsule, Take 1 capsule (40 mg total) by mouth daily., Disp: 90 capsule, Rfl: 2   phentermine 37.5 MG capsule, Take 1 capsule (37.5 mg total) by mouth every morning., Disp: 30 capsule, Rfl: 2   rizatriptan (MAXALT) 10 MG tablet, TAKE 1 TABLET BY MOUTH AS NEEDED FOR MIGRAINE. MAY REPEAT IN 2 HOURS IF NEEDED, Disp: 10 tablet, Rfl: 1   topiramate (TOPAMAX) 50 MG tablet, Take 1 tablet (50 mg total) by mouth 2 (two)  times daily., Disp: 60 tablet, Rfl: 0  No Known Allergies Past Medical History:  Diagnosis Date   IBS (irritable bowel syndrome)     Observations/Objective: Physical Exam Constitutional:      General: She is not in acute distress.    Appearance: Normal appearance. She is not ill-appearing or toxic-appearing.  Eyes:     General: No scleral icterus.       Right eye: No discharge.        Left eye: No discharge.     Conjunctiva/sclera: Conjunctivae normal.  Pulmonary:     Effort: Pulmonary effort is normal. No respiratory distress.  Skin:    Findings: Rash (erythematous and blistery) present.  Neurological:     Mental Status: She is alert and oriented to person, place, and time.  Psychiatric:        Mood and Affect: Mood normal.        Behavior: Behavior normal.        Thought Content: Thought content normal.        Judgment: Judgment normal.    Assessment and Plan: 1. Rhus dermatitis - Continue calamine lotion. Zyrtec or Xyzal for itching.  - predniSONE (STERAPRED UNI-PAK 21 TAB) 10 MG (21) TBPK tablet; As directed x 6 days  Dispense: 21 tablet; Refill: 0  2. Seasonal allergies - fluticasone (FLONASE) 50 MCG/ACT nasal spray; Place 2 sprays into both nostrils daily.  Dispense: 16 g; Refill: 6 - loratadine (  CLARITIN) 10 MG tablet; Take 1 tablet (10 mg total) by mouth daily.  Dispense: 30 tablet; Refill: 2   Follow Up Instructions:  I discussed the assessment and treatment plan with the patient. The patient was provided an opportunity to ask questions and all were answered. The patient agreed with the plan and demonstrated an understanding of the instructions.   The patient was advised to call back or seek an in-person evaluation if the symptoms worsen or if the condition fails to improve as anticipated.  The above assessment and management plan was discussed with the patient. The patient verbalized understanding of and has agreed to the management plan. Patient is aware to  call the clinic if symptoms persist or worsen. Patient is aware when to return to the clinic for a follow-up visit. Patient educated on when it is appropriate to go to the emergency department.   Time call ended: 5:34 PM  I provided 9 minutes of face-to-face time during this encounter.   Hendricks Limes, MSN, APRN, FNP-C [facility] 10/11/19

## 2019-10-31 ENCOUNTER — Encounter: Payer: Self-pay | Admitting: Family

## 2019-10-31 ENCOUNTER — Ambulatory Visit (INDEPENDENT_AMBULATORY_CARE_PROVIDER_SITE_OTHER): Payer: 59 | Admitting: Family

## 2019-10-31 ENCOUNTER — Other Ambulatory Visit: Payer: Self-pay

## 2019-10-31 VITALS — BP 114/72 | HR 88 | Temp 97.1°F | Ht 64.0 in | Wt 245.0 lb

## 2019-10-31 DIAGNOSIS — J309 Allergic rhinitis, unspecified: Secondary | ICD-10-CM | POA: Insufficient documentation

## 2019-10-31 DIAGNOSIS — F411 Generalized anxiety disorder: Secondary | ICD-10-CM

## 2019-10-31 DIAGNOSIS — F339 Major depressive disorder, recurrent, unspecified: Secondary | ICD-10-CM

## 2019-10-31 DIAGNOSIS — G43109 Migraine with aura, not intractable, without status migrainosus: Secondary | ICD-10-CM | POA: Diagnosis not present

## 2019-10-31 DIAGNOSIS — J301 Allergic rhinitis due to pollen: Secondary | ICD-10-CM | POA: Diagnosis not present

## 2019-10-31 DIAGNOSIS — Z713 Dietary counseling and surveillance: Secondary | ICD-10-CM

## 2019-10-31 MED ORDER — ONDANSETRON HCL 4 MG PO TABS: 4.0000 mg | ORAL_TABLET | Freq: Three times a day (TID) | ORAL | 0 refills | Status: DC | PRN

## 2019-10-31 MED ORDER — OZEMPIC (0.25 OR 0.5 MG/DOSE) 2 MG/1.5ML ~~LOC~~ SOPN: PEN_INJECTOR | SUBCUTANEOUS | 1 refills | Status: AC

## 2019-10-31 NOTE — Patient Instructions (Signed)

## 2019-10-31 NOTE — Progress Notes (Signed)
Subjective:    Patient ID: Brittney Farmer, female    DOB: 1985/07/07, 34 y.o.   MRN: [id number]  Chief Complaint  Patient presents with  . Medical Management of Chronic Issues   PT presents to the office today to establish care.  Depression        This is a chronic problem.  The current episode started more than 1 year ago.   The onset quality is sudden.   The problem occurs intermittently.  The problem has been waxing and waning since onset.  Associated symptoms include irritable and restlessness.  Associated symptoms include no helplessness and no hopelessness.  Past treatments include SSRIs - Selective serotonin reuptake inhibitors.  Compliance with treatment is good.  Past medical history includes anxiety.   Anxiety Presents for follow-up visit. Symptoms include depressed mood, irritability, nervous/anxious behavior and restlessness. Symptoms occur occasionally. The severity of symptoms is moderate.    Migraine  This is a chronic problem. The current episode started more than 1 year ago. The problem occurs intermittently.      Review of Systems  Constitutional: Positive for irritability.  Psychiatric/Behavioral: Positive for depression. The patient is nervous/anxious.   All other systems reviewed and are negative.      Objective:   Physical Exam Vitals reviewed.  Constitutional:      General: She is irritable. She is not in acute distress.    Appearance: She is well-developed. She is obese.  HENT:     Head: Normocephalic and atraumatic.     Right Ear: Tympanic membrane normal.     Left Ear: Tympanic membrane normal.  Eyes:     Pupils: Pupils are equal, round, and reactive to light.  Neck:     Thyroid: No thyromegaly.  Cardiovascular:     Rate and Rhythm: Normal rate and regular rhythm.     Heart sounds: Normal heart sounds. No murmur heard.   Pulmonary:     Effort: Pulmonary effort is normal. No respiratory distress.     Breath sounds: Normal breath sounds. No  wheezing.  Abdominal:     General: Bowel sounds are normal. There is no distension.     Palpations: Abdomen is soft.     Tenderness: There is no abdominal tenderness.  Musculoskeletal:        General: No tenderness. Normal range of motion.     Cervical back: Normal range of motion and neck supple.  Skin:    General: Skin is warm and dry.  Neurological:     Mental Status: She is alert and oriented to person, place, and time.     Cranial Nerves: No cranial nerve deficit.     Deep Tendon Reflexes: Reflexes are normal and symmetric.  Psychiatric:        Behavior: Behavior normal.        Thought Content: Thought content normal.        Judgment: Judgment normal.      BP 114/72   Pulse 88   Temp (!) 97.1 F (36.2 C) (Temporal)   Ht 5' 4"  (1.626 m)   Wt 245 lb (111.1 kg)   BMI 42.05 kg/m      Assessment & Plan:  Brittney Farmer comes in today with chief complaint of Medical Management of Chronic Issues   Diagnosis and orders addressed:  1. GAD (generalized anxiety disorder) - CMP14+EGFR - CBC with Differential/Platelet  2. Depression, recurrent ([place]) - CMP14+EGFR - CBC with Differential/Platelet  3. Allergic rhinitis due to pollen, unspecified  seasonality - CMP14+EGFR - CBC with Differential/Platelet  4. Migraine with aura and without status migrainosus, not intractable - CMP14+EGFR - CBC with Differential/Platelet  5. Morbid obesity ([place]) Will start Ozempic today Weight loss and healthy diet  - Semaglutide,0.25 or 0.5MG/DOS, (OZEMPIC, 0.25 OR 0.5 MG/DOSE,) 2 MG/1.5ML SOPN; Inject 0.1875 mLs (0.25 mg total) into the skin once a week for 28 days, THEN 0.375 mLs (0.5 mg total) once a week for 28 days.  Dispense: 12 pen; Refill: 1 - CMP14+EGFR - CBC with Differential/Platelet  6. Weight loss counseling, encounter for - Semaglutide,0.25 or 0.5MG/DOS, (OZEMPIC, 0.25 OR 0.5 MG/DOSE,) 2 MG/1.5ML SOPN; Inject 0.1875 mLs (0.25 mg total) into the skin once a week for 28  days, THEN 0.375 mLs (0.5 mg total) once a week for 28 days.  Dispense: 12 pen; Refill: 1 - CMP14+EGFR - CBC with Differential/Platelet - TSH   Labs pending Health Maintenance reviewed Diet and exercise encouraged  Follow up plan: 3 months    Brittney Dun, FNP

## 2019-11-01 LAB — CMP14+EGFR
ALT: 9 IU/L (ref 0–32)
AST: 10 IU/L (ref 0–40)
Albumin/Globulin Ratio: 1.8 (ref 1.2–2.2)
Albumin: 4.2 g/dL (ref 3.8–4.8)
Alkaline Phosphatase: 92 IU/L (ref 48–121)
BUN/Creatinine Ratio: 16 (ref 9–23)
BUN: 12 mg/dL (ref 6–20)
Bilirubin Total: 0.3 mg/dL (ref 0.0–1.2)
CO2: 20 mmol/L (ref 20–29)
Calcium: 9.2 mg/dL (ref 8.7–10.2)
Chloride: 102 mmol/L (ref 96–106)
Creatinine, Ser: 0.77 mg/dL (ref 0.57–1.00)
GFR calc Af Amer: 118 mL/min/{1.73_m2} (ref >59)
GFR calc non Af Amer: 102 mL/min/{1.73_m2} (ref >59)
Globulin, Total: 2.4 g/dL (ref 1.5–4.5)
Glucose: 87 mg/dL (ref 65–99)
Potassium: 4.5 mmol/L (ref 3.5–5.2)
Sodium: 137 mmol/L (ref 134–144)
Total Protein: 6.6 g/dL (ref 6.0–8.5)

## 2019-11-01 LAB — CBC WITH DIFFERENTIAL/PLATELET
Basophils Absolute: 0 10*3/uL (ref 0.0–0.2)
Basos: 1 %
EOS (ABSOLUTE): 0.2 10*3/uL (ref 0.0–0.4)
Eos: 2 %
Hematocrit: 40 % (ref 34.0–46.6)
Hemoglobin: 13.3 g/dL (ref 11.1–15.9)
Immature Grans (Abs): 0 10*3/uL (ref 0.0–0.1)
Immature Granulocytes: 0 %
Lymphocytes Absolute: 1.8 10*3/uL (ref 0.7–3.1)
Lymphs: 22 %
MCH: 27.5 pg (ref 26.6–33.0)
MCHC: 33.3 g/dL (ref 31.5–35.7)
MCV: 83 fL (ref 79–97)
Monocytes Absolute: 0.5 10*3/uL (ref 0.1–0.9)
Monocytes: 6 %
Neutrophils Absolute: 5.7 10*3/uL (ref 1.4–7.0)
Neutrophils: 69 %
Platelets: 352 10*3/uL (ref 150–450)
RBC: 4.83 x10E6/uL (ref 3.77–5.28)
RDW: 13.8 % (ref 11.7–15.4)
WBC: 8.3 10*3/uL (ref 3.4–10.8)

## 2019-11-01 LAB — TSH: TSH: 1.05 u[IU]/mL (ref 0.450–4.500)

## 2020-01-13 ENCOUNTER — Other Ambulatory Visit: Payer: Self-pay | Admitting: Family

## 2020-02-03 ENCOUNTER — Other Ambulatory Visit: Payer: Self-pay

## 2020-02-03 ENCOUNTER — Encounter: Payer: Self-pay | Admitting: Family

## 2020-02-03 ENCOUNTER — Ambulatory Visit (INDEPENDENT_AMBULATORY_CARE_PROVIDER_SITE_OTHER): Payer: 59 | Admitting: Family

## 2020-02-03 VITALS — BP 109/76 | HR 81 | Temp 98.0°F | Ht 64.0 in | Wt 246.2 lb

## 2020-02-03 DIAGNOSIS — F339 Major depressive disorder, recurrent, unspecified: Secondary | ICD-10-CM

## 2020-02-03 DIAGNOSIS — L255 Unspecified contact dermatitis due to plants, except food: Secondary | ICD-10-CM | POA: Diagnosis not present

## 2020-02-03 DIAGNOSIS — F411 Generalized anxiety disorder: Secondary | ICD-10-CM | POA: Diagnosis not present

## 2020-02-03 DIAGNOSIS — G43109 Migraine with aura, not intractable, without status migrainosus: Secondary | ICD-10-CM

## 2020-02-03 DIAGNOSIS — Z713 Dietary counseling and surveillance: Secondary | ICD-10-CM

## 2020-02-03 DIAGNOSIS — K219 Gastro-esophageal reflux disease without esophagitis: Secondary | ICD-10-CM | POA: Diagnosis not present

## 2020-02-03 MED ORDER — OZEMPIC (1 MG/DOSE) 2 MG/1.5ML ~~LOC~~ SOPN: 1.0000 mg | PEN_INJECTOR | SUBCUTANEOUS | 3 refills | Status: DC

## 2020-02-03 MED ORDER — METHYLPREDNISOLONE ACETATE 80 MG/ML IJ SUSP
80.0000 mg | Freq: Once | INTRAMUSCULAR | Status: AC
Start: 2020-02-03 — End: 2020-02-03
  Administered 2020-02-03: 80 mg via INTRAMUSCULAR

## 2020-02-03 NOTE — Patient Instructions (Signed)

## 2020-02-03 NOTE — Progress Notes (Signed)
Subjective:    Patient ID: Brittney Farmer, female    DOB: Nov 17, 1985, 34 y.o.   MRN: [id number]  Chief Complaint  Patient presents with  . Follow-up    zofran cost too much   . Poison [place]   PT presents to the office today for chronic follow up. She was seen on 10/31/19 and tried Ozempic for weight loss. She has gained 1 lb from her last visit. She states she has been walking 2 times a week for about 30 minutes.  Anxiety Presents for follow-up visit. Symptoms include depressed mood, excessive worry, nausea, nervous/anxious behavior and restlessness. Symptoms occur occasionally. The severity of symptoms is moderate.    Depression        This is a chronic problem.  The current episode started more than 1 year ago.   The onset quality is gradual.   The problem occurs intermittently.  Associated symptoms include irritable and restlessness.  Associated symptoms include no helplessness and no hopelessness.  Past medical history includes anxiety.   Gastroesophageal Reflux She complains of belching, heartburn and nausea. This is a chronic problem. The current episode started more than 1 year ago. The problem occurs occasionally. She has tried a PPI for the symptoms. The treatment provided moderate relief.  Migraine  This is a chronic problem. The current episode started more than 1 year ago. The problem occurs intermittently. The problem has been resolved. Associated symptoms include nausea, phonophobia and photophobia.  Rash This is a new problem. The current episode started in the past 7 days. The problem has been waxing and waning since onset. The affected locations include the left arm, face, left lower leg, left upper leg, right arm and abdomen. The rash is characterized by blistering, itchiness and redness. She was exposed to plant contact.      Review of Systems  Eyes: Positive for photophobia.  Gastrointestinal: Positive for heartburn and nausea.  Skin: Positive for rash.   Psychiatric/Behavioral: Positive for depression. The patient is nervous/anxious.   All other systems reviewed and are negative.      Objective:   Physical Exam Constitutional:      General: She is irritable.     Appearance: Normal appearance.  HENT:     Right Ear: Tympanic membrane normal.     Left Ear: Tympanic membrane normal.     Nose: Nose normal.  Eyes:     Pupils: Pupils are equal, round, and reactive to light.  Cardiovascular:     Rate and Rhythm: Normal rate.     Pulses: Normal pulses.  Pulmonary:     Effort: Pulmonary effort is normal.  Abdominal:     General: Bowel sounds are normal. There is no distension.     Palpations: Abdomen is soft.     Tenderness: There is no abdominal tenderness.  Musculoskeletal:        General: Normal range of motion.     Cervical back: Normal range of motion.  Skin:    General: Skin is warm and dry.     Capillary Refill: Capillary refill takes less than 2 seconds.     Findings: Rash present.     Comments: Erythemas rash on face, bilateral arms, and legs  Neurological:     General: No focal deficit present.     Mental Status: She is alert and oriented to person, place, and time.       BP 109/76   Pulse 81   Temp 98 F (36.7 C) (Temporal)  Ht 5\' 4"  (1.626 m)   Wt 246 lb 3.2 oz (111.7 kg)   BMI 42.26 kg/m      Assessment & Plan:  [name] comes in today with chief complaint of Follow-up (zofran cost too much ) and Poison [place]   Diagnosis and orders addressed:  1. Gastroesophageal reflux disease without esophagitis  2. Depression, recurrent ([place])  3. GAD (generalized anxiety disorder)  4. Morbid obesity ([place]) - Amb Ref to Medical Weight Management - Semaglutide, 1 MG/DOSE, (OZEMPIC, 1 MG/DOSE,) 2 MG/1.5ML SOPN; Inject 1 mg into the skin once a week.  Dispense: 1.5 mL; Refill: 3  5. Migraine with aura and without status migrainosus, not intractable  6. Contact dermatitis due to plant Avoid scratching -Pt  to report any s/s of infection -Wear protective clothing while outside- Long sleeves and long pants -Take a shower as soon as possible after being outside - methylPREDNISolone acetate (DEPO-MEDROL) injection 80 mg  7. Weight loss counseling, encounter for Will increase Ozempic to 1 mg - Amb Ref to Medical Weight Management - Semaglutide, 1 MG/DOSE, (OZEMPIC, 1 MG/DOSE,) 2 MG/1.5ML SOPN; Inject 1 mg into the skin once a week.  Dispense: 1.5 mL; Refill: 3    Health Maintenance reviewed Diet and exercise encouraged  Follow up plan: 6 months and will follow up with weight loss clinic   Evelina Dun, FNP

## 2020-02-20 ENCOUNTER — Other Ambulatory Visit: Payer: Self-pay

## 2020-02-20 ENCOUNTER — Encounter (INDEPENDENT_AMBULATORY_CARE_PROVIDER_SITE_OTHER): Payer: Self-pay | Admitting: Family Medicine

## 2020-02-20 ENCOUNTER — Ambulatory Visit (INDEPENDENT_AMBULATORY_CARE_PROVIDER_SITE_OTHER): Payer: 59 | Admitting: Family Medicine

## 2020-02-20 VITALS — BP 112/72 | HR 80 | Temp 98.3°F | Ht 63.0 in | Wt 235.0 lb

## 2020-02-20 DIAGNOSIS — F3289 Other specified depressive episodes: Secondary | ICD-10-CM

## 2020-02-20 DIAGNOSIS — D649 Anemia, unspecified: Secondary | ICD-10-CM | POA: Insufficient documentation

## 2020-02-20 DIAGNOSIS — R5383 Other fatigue: Secondary | ICD-10-CM | POA: Diagnosis not present

## 2020-02-20 DIAGNOSIS — K588 Other irritable bowel syndrome: Secondary | ICD-10-CM | POA: Insufficient documentation

## 2020-02-20 DIAGNOSIS — R0602 Shortness of breath: Secondary | ICD-10-CM | POA: Insufficient documentation

## 2020-02-20 DIAGNOSIS — F39 Unspecified mood [affective] disorder: Secondary | ICD-10-CM

## 2020-02-20 DIAGNOSIS — Z6841 Body Mass Index (BMI) 40.0 and over, adult: Secondary | ICD-10-CM

## 2020-02-20 DIAGNOSIS — Z9189 Other specified personal risk factors, not elsewhere classified: Secondary | ICD-10-CM | POA: Diagnosis not present

## 2020-02-20 DIAGNOSIS — Z0289 Encounter for other administrative examinations: Secondary | ICD-10-CM

## 2020-02-20 DIAGNOSIS — D508 Other iron deficiency anemias: Secondary | ICD-10-CM

## 2020-02-21 LAB — VITAMIN B12: Vitamin B-12: 532 pg/mL (ref 232–1245)

## 2020-02-21 LAB — CBC WITH DIFFERENTIAL/PLATELET
Basophils Absolute: 0.1 10*3/uL (ref 0.0–0.2)
Basos: 1 %
EOS (ABSOLUTE): 0.1 10*3/uL (ref 0.0–0.4)
Eos: 1 %
Hematocrit: 43 % (ref 34.0–46.6)
Hemoglobin: 13.7 g/dL (ref 11.1–15.9)
Immature Grans (Abs): 0 10*3/uL (ref 0.0–0.1)
Immature Granulocytes: 0 %
Lymphocytes Absolute: 2.4 10*3/uL (ref 0.7–3.1)
Lymphs: 22 %
MCH: 27.2 pg (ref 26.6–33.0)
MCHC: 31.9 g/dL (ref 31.5–35.7)
MCV: 86 fL (ref 79–97)
Monocytes Absolute: 0.6 10*3/uL (ref 0.1–0.9)
Monocytes: 5 %
Neutrophils Absolute: 7.6 10*3/uL — ABNORMAL HIGH (ref 1.4–7.0)
Neutrophils: 71 %
Platelets: 404 10*3/uL (ref 150–450)
RBC: 5.03 x10E6/uL (ref 3.77–5.28)
RDW: 13.4 % (ref 11.7–15.4)
WBC: 10.8 10*3/uL (ref 3.4–10.8)

## 2020-02-21 LAB — COMPREHENSIVE METABOLIC PANEL
ALT: 10 IU/L (ref 0–32)
AST: 6 IU/L (ref 0–40)
Albumin/Globulin Ratio: 2 (ref 1.2–2.2)
Albumin: 4.5 g/dL (ref 3.8–4.8)
Alkaline Phosphatase: 121 IU/L (ref 44–121)
BUN/Creatinine Ratio: 13 (ref 9–23)
BUN: 9 mg/dL (ref 6–20)
Bilirubin Total: 0.3 mg/dL (ref 0.0–1.2)
CO2: 23 mmol/L (ref 20–29)
Calcium: 9.2 mg/dL (ref 8.7–10.2)
Chloride: 103 mmol/L (ref 96–106)
Creatinine, Ser: 0.72 mg/dL (ref 0.57–1.00)
GFR calc Af Amer: 128 mL/min/{1.73_m2} (ref >59)
GFR calc non Af Amer: 111 mL/min/{1.73_m2} (ref >59)
Globulin, Total: 2.3 g/dL (ref 1.5–4.5)
Glucose: 77 mg/dL (ref 65–99)
Potassium: 4.1 mmol/L (ref 3.5–5.2)
Sodium: 140 mmol/L (ref 134–144)
Total Protein: 6.8 g/dL (ref 6.0–8.5)

## 2020-02-21 LAB — IRON,TIBC AND FERRITIN PANEL
Ferritin: 103 ng/mL (ref 15–150)
Iron Saturation: 13 % — ABNORMAL LOW (ref 15–55)
Iron: 38 ug/dL (ref 27–159)
Total Iron Binding Capacity: 284 ug/dL (ref 250–450)
UIBC: 246 ug/dL (ref 131–425)

## 2020-02-21 LAB — LIPID PANEL
Chol/HDL Ratio: 4 ratio (ref 0.0–4.4)
Cholesterol, Total: 195 mg/dL (ref 100–199)
HDL: 49 mg/dL (ref >39)
LDL Chol Calc (NIH): 137 mg/dL — ABNORMAL HIGH (ref 0–99)
Triglycerides: 51 mg/dL (ref 0–149)
VLDL Cholesterol Cal: 9 mg/dL (ref 5–40)

## 2020-02-21 LAB — HEMOGLOBIN A1C
Est. average glucose Bld gHb Est-mCnc: 100 mg/dL
Hgb A1c MFr Bld: 5.1 % (ref 4.8–5.6)

## 2020-02-21 LAB — T3: T3, Total: 115 ng/dL (ref 71–180)

## 2020-02-21 LAB — TSH: TSH: 1.01 u[IU]/mL (ref 0.450–4.500)

## 2020-02-21 LAB — VITAMIN D 25 HYDROXY (VIT D DEFICIENCY, FRACTURES): Vit D, 25-Hydroxy: 30 ng/mL (ref 30.0–100.0)

## 2020-02-21 LAB — FOLATE: Folate: 9.6 ng/mL (ref >3.0)

## 2020-02-21 LAB — T4: T4, Total: 8.5 ug/dL (ref 4.5–12.0)

## 2020-02-21 LAB — INSULIN, RANDOM: INSULIN: 5.2 u[IU]/mL (ref 2.6–24.9)

## 2020-02-25 NOTE — Progress Notes (Signed)
Dear Brittney Dun, FNP,   Thank you for referring Brittney Farmer to our clinic. The following note includes my evaluation and treatment recommendations.  Chief Complaint:   OBESITY Brittney Farmer (MR# [id number]) is a 34 y.o. female who presents for evaluation and treatment of obesity and related comorbidities. Current BMI is Body mass index is 41.63 kg/m. Brittney Farmer has been struggling with her weight for many years and has been unsuccessful in either losing weight, maintaining weight loss, or reaching her healthy weight goal.  Brittney Farmer is currently in the action stage of change and ready to dedicate time achieving and maintaining a healthier weight. Brittney Farmer is interested in becoming our patient and working on intensive lifestyle modifications including (but not limited to) diet and exercise for weight loss.  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  She lives with her husband, Brittney Farmer, and 4 children, ages 20, 51, 21, and 46 months.  She did a keto diet in the past and lost 60 pounds, but gained it all back when she went back to eating "normal".  She skips 1 meal per day on average, which meal varies and depends on the day.  Her worst habit is not eating or eating junk food.  Brittney Farmer's habits were reviewed today and are as follows: Her family eats meals together, she thinks her family will eat healthier with her, her desired weight loss is 65 pounds, she has been heavy most of her life, she started gaining weight after having her first baby, her heaviest weight ever was 256 pounds, she is a picky eater and doesn't like to eat healthier foods, she craves sweets, she skips meals frequently, she is frequently drinking liquids with calories, she frequently makes poor food choices, she has problems with excessive hunger, she frequently eats larger portions than normal and she struggles with emotional eating.  This is the patient's first visit at [facility].  The patient's  NEW PATIENT PACKET that they filled out prior to today's office visit was reviewed at length and information from that paperwork was included within the following office visit note.    Included in the packet: current and past health history, medications, allergies, ROS, gynecologic history (women only), surgical history, family history, social history, weight history, weight loss surgery history (for those that have had weight loss surgery), nutritional evaluation, mood and food questionnaire along with a depression screening (PHQ9) on all patients, an Epworth questionnaire, sleep habits questionnaire, patient life and health improvement goals questionnaire. These will all be scanned into the patient's chart under media.   During the visit, I independently reviewed the patient's EKG, bioimpedance scale results, and indirect calorimeter results. I used this information to tailor a meal plan for the patient that will help Brittney Farmer to lose weight and will improve her obesity-related conditions going forward.  I performed a medically necessary appropriate examination and/or evaluation. I discussed the assessment and treatment plan with the patient. The patient was provided an opportunity to ask questions and all were answered. The patient agreed with the plan and demonstrated an understanding of the instructions. Labs were ordered today (unless patient declined them) and will be reviewed with the patient at our next visit unless more critical results need to be addressed immediately. Clinical information was updated and documented in the EMR.  Time spent on visit including pre-visit chart review and post-visit care was estimated to be 60-74 minutes.  A separate 15 minutes was spent on risk counseling (  see above/below).   Depression Screen Brittney Farmer's Food and Mood (modified PHQ-9) score was 17.  Depression screen PHQ 2/9 02/20/2020  Decreased Interest 3  Down, Depressed, Hopeless 3  PHQ - 2 Score 6  Altered  sleeping 1  Tired, decreased energy 3  Change in appetite 3  Feeling bad or failure about yourself  3  Trouble concentrating 1  Moving slowly or fidgety/restless 0  Suicidal thoughts 0  PHQ-9 Score 17  Difficult doing work/chores Very difficult  Some recent data might be hidden   Assessment/Plan:   1. Other fatigue Brittney Farmer denies daytime somnolence and admits to waking up still tired. Patent has a history of symptoms of morning fatigue, morning headache and snoring. Brittney Farmer generally gets 8 hours of sleep per night, and states that she has poor sleep quality. Snoring is present. Apneic episodes are not present. Epworth Sleepiness Score is 9.  Brittney Farmer does feel that her weight is causing her energy to be lower than it should be. Fatigue may be related to obesity, depression or many other causes. Labs will be ordered, and in the meanwhile, [name] will focus on self care including making healthy food choices, increasing physical activity and focusing on stress reduction.  - EKG 12-Lead - Vitamin B12 - CBC with Differential/Platelet - Comprehensive metabolic panel - Folate - Hemoglobin A1c - Insulin, random - Lipid panel - T3 - T4 - TSH - VITAMIN D 25 Hydroxy (Vit-D Deficiency, Fractures)  2. SOB (shortness of breath) on exertion Brittney Farmer notes increasing shortness of breath with exercising and seems to be worsening over time with weight gain. She notes getting out of breath sooner with activity than she used to. This has gotten worse recently. [name] denies shortness of breath at rest or orthopnea.  Brittney Farmer does feel that she gets out of breath more easily that she used to when she exercises. Brittney Farmer's shortness of breath appears to be obesity related and exercise induced. She has agreed to work on weight loss and gradually increase exercise to treat her exercise induced shortness of breath. Will continue to monitor closely.  - Vitamin B12 - CBC with Differential/Platelet -  Comprehensive metabolic panel - Folate - Hemoglobin A1c - Insulin, random - Lipid panel  3. Other irritable bowel syndrome Brittney Farmer has a mixture of nausea with diarrhea and constipation at times.  Symptoms are currently stable.  4. Anemia, unspecified type Brittney Farmer is not a vegetarian.  She does not have a history of weight loss surgery.  She is not on any medication or supplement currently.  History of iron deficiency.  Plan:  Check labs including B12, folate, CBC, iron panel.  CBC Latest Ref Rng & Units 02/20/2020 10/31/2019 07/20/2016  WBC 3.4 - 10.8 x10E3/uL 10.8 8.3 6.7  Hemoglobin 11.1 - 15.9 g/dL 13.7 13.3 11.2  Hematocrit 34.0 - 46.6 % 43.0 40.0 34.8  Platelets 150 - 450 x10E3/uL 404 352 362   Lab Results  Component Value Date   IRON 38 02/20/2020   TIBC 284 02/20/2020   FERRITIN 103 02/20/2020   Lab Results  Component Value Date   VITAMINB12 532 02/20/2020   - Vitamin B12 - CBC with Differential/Platelet - Folate - Fe+TIBC+Fer  5. Other depression, with emotional eating With anxiety for many years.  She is taking Lexapro and Buspar.  PHQ-9 is 17.  She says her mood is improving.  Denies SI.    Plan:  Recommend CBT in addition to medication management per specialist/PCP.  Continue medications per PCP.  6. At risk for impaired metabolic function Due to Brittney Farmer's current state of health and medical condition(s), they are at a significantly higher risk for impaired metabolic function.   This further also puts the patient at much greater risk to also subsequently develop cardiopulmonary conditions that can negatively affect patient's quality of life as well.  At least 28 minutes was spent on counseling Brittney Farmer about these concerns today and I stressed the importance of reversing these risks factors.   Initial goal is to lose at least 5-10% of starting weight to help reduce risk factors.   Counseling: Intensive lifestyle modifications discussed with Brittney Farmer as most appropriate  first line treatment.  she will continue to work on diet, exercise and weight loss efforts.  We will continue to reassess these conditions on a fairly regular basis in an attempt to decrease patient's overall morbidity and mortality  7. Class 3 severe obesity with serious comorbidity and body mass index (BMI) of 40.0 to 44.9 in adult, unspecified obesity type (HCC)  Brittney Farmer is currently in the action stage of change and her goal is to continue with weight loss efforts. I recommend Brittney Farmer begin the structured treatment plan as follows:  She has agreed to the Category 1 Plan.  Exercise goals: As is.   Behavioral modification strategies: no skipping meals, meal planning and cooking strategies and planning for success.  She was informed of the importance of frequent follow-up visits to maximize her success with intensive lifestyle modifications for her multiple health conditions. She was informed we would discuss her lab results at her next visit unless there is a critical issue that needs to be addressed sooner. Brittney Farmer agreed to keep her next visit at the agreed upon time to discuss these results.  Objective:   Blood pressure 112/72, pulse 80, temperature 98.3 F (36.8 C), height 5\' 3"  (1.6 m), weight 235 lb (106.6 kg), SpO2 100 %. Body mass index is 41.63 kg/m.  EKG: Normal sinus rhythm, rate 78 bpm.  Indirect Calorimeter completed today shows a VO2 of 214 and a REE of 1489.  Her calculated basal metabolic rate is 5366 thus her basal metabolic rate is worse than expected.  General: Cooperative, alert, well developed, in no acute distress. HEENT: Conjunctivae and lids unremarkable. Cardiovascular: Regular rhythm.  Lungs: Normal work of breathing. Neurologic: No focal deficits.   Lab Results  Component Value Date   CREATININE 0.72 02/20/2020   BUN 9 02/20/2020   NA 140 02/20/2020   K 4.1 02/20/2020   CL 103 02/20/2020   CO2 23 02/20/2020   Lab Results  Component Value Date    ALT 10 02/20/2020   AST 6 02/20/2020   ALKPHOS 121 02/20/2020   BILITOT 0.3 02/20/2020   Lab Results  Component Value Date   HGBA1C 5.1 02/20/2020   Lab Results  Component Value Date   INSULIN 5.2 02/20/2020   Lab Results  Component Value Date   TSH 1.010 02/20/2020   Lab Results  Component Value Date   CHOL 195 02/20/2020   HDL 49 02/20/2020   LDLCALC 137 (H) 02/20/2020   TRIG 51 02/20/2020   CHOLHDL 4.0 02/20/2020   Lab Results  Component Value Date   WBC 10.8 02/20/2020   HGB 13.7 02/20/2020   HCT 43.0 02/20/2020   MCV 86 02/20/2020   PLT 404 02/20/2020   Lab Results  Component Value Date   IRON 38 02/20/2020   TIBC 284 02/20/2020   FERRITIN 103 02/20/2020   Attestation  Statements:   Reviewed by clinician on day of visit: allergies, medications, problem list, medical history, surgical history, family history, social history, and previous encounter notes.  I, Water quality scientist, CMA, am acting as Location manager for [employer], DO.  I have reviewed the above documentation for accuracy and completeness, and I agree with the above. Marjory Sneddon, D.O.  The [place] was signed into law in 2016 which includes the topic of electronic health records.  This provides immediate access to information in MyChart.  This includes consultation notes, operative notes, office notes, lab results and pathology reports.  If you have any questions about what you read please let us know at your next visit so we can discuss your concerns and take corrective action if need be.  We are right here with you.

## 2020-03-05 ENCOUNTER — Ambulatory Visit (INDEPENDENT_AMBULATORY_CARE_PROVIDER_SITE_OTHER): Payer: 59 | Admitting: Family Medicine

## 2020-03-05 ENCOUNTER — Encounter (INDEPENDENT_AMBULATORY_CARE_PROVIDER_SITE_OTHER): Payer: Self-pay | Admitting: Family Medicine

## 2020-03-05 ENCOUNTER — Telehealth: Payer: Self-pay

## 2020-03-05 ENCOUNTER — Other Ambulatory Visit: Payer: Self-pay

## 2020-03-05 ENCOUNTER — Other Ambulatory Visit: Payer: Self-pay | Admitting: *Deleted

## 2020-03-05 VITALS — BP 108/69 | HR 64 | Temp 97.8°F | Ht 63.0 in | Wt 231.0 lb

## 2020-03-05 DIAGNOSIS — E559 Vitamin D deficiency, unspecified: Secondary | ICD-10-CM | POA: Diagnosis not present

## 2020-03-05 DIAGNOSIS — F39 Unspecified mood [affective] disorder: Secondary | ICD-10-CM

## 2020-03-05 DIAGNOSIS — D649 Anemia, unspecified: Secondary | ICD-10-CM

## 2020-03-05 DIAGNOSIS — F32A Depression, unspecified: Secondary | ICD-10-CM | POA: Insufficient documentation

## 2020-03-05 DIAGNOSIS — Z713 Dietary counseling and surveillance: Secondary | ICD-10-CM

## 2020-03-05 DIAGNOSIS — Z9189 Other specified personal risk factors, not elsewhere classified: Secondary | ICD-10-CM | POA: Diagnosis not present

## 2020-03-05 DIAGNOSIS — E7849 Other hyperlipidemia: Secondary | ICD-10-CM

## 2020-03-05 DIAGNOSIS — Z6841 Body Mass Index (BMI) 40.0 and over, adult: Secondary | ICD-10-CM

## 2020-03-05 DIAGNOSIS — F3289 Other specified depressive episodes: Secondary | ICD-10-CM

## 2020-03-05 MED ORDER — OZEMPIC (1 MG/DOSE) 2 MG/1.5ML ~~LOC~~ SOPN: 1.0000 mg | PEN_INJECTOR | SUBCUTANEOUS | 3 refills | Status: DC

## 2020-03-05 MED ORDER — VITAMIN D (ERGOCALCIFEROL) 1.25 MG (50000 UNIT) PO CAPS: 50000.0000 [IU] | ORAL_CAPSULE | ORAL | 0 refills | Status: DC

## 2020-03-05 NOTE — Telephone Encounter (Signed)
  Prescription Request  03/05/2020  What is the name of the medication or equipment? ozempic 1 mg  Have you contacted your pharmacy to request a refill? (if applicable) no  Which pharmacy would you [facility] sent to? [facility]    Patient notified that their request is being sent to the clinical staff for review and that they should receive a response within 2 business days.

## 2020-03-05 NOTE — Telephone Encounter (Signed)
Refill sent as requested. 

## 2020-03-09 NOTE — Progress Notes (Signed)
Chief Complaint:   OBESITY Brittney Farmer is here to discuss her progress with her obesity treatment plan along with follow-up of her obesity related diagnoses. Brittney Farmer is on the Category 1 Plan and states she is following her eating plan approximately 50-75% of the time. Brittney Farmer states she is walking 4,000 steps 5.5 days per week.  Today's visit was #: 2 Starting weight: 235 lbs Starting date: 02/20/2020 Today's weight: 231 lbs Today's date: 03/05/2020 Total lbs lost to date: 4 lbs Total lbs lost since last in-office visit: 4 lbs Total weight loss percentage to date: -1.70%   Interim History:  This is pt's first folow up OV, and she is her to review labs and meal plan.  Brittney Farmer says she had her menses within the past 2 weeks and had chocolate cravings with Halloween and ate candy/sweets.  She also did not eat all of the foods on the plan.  She feels Ozempic makes her not eat.  Plan:   I had a long discussion with the patient regarding her living 45+ minutes away and also with the holidays coming up.  She wishes to change to [place] and also only come every 3 weeks.  We had a discussion about the risks and benefits of doing this.  She feels it is too much food to eat and she just cannot follow it at this time. Emotionally, she is not ready to move forward with her goals of losing weight.  Pt is in the pre-contemplative stages of change.    Assessment/Plan:   1. Other hyperlipidemia Discussed labs with patient today.  Brittney Farmer has hyperlipidemia and has been trying to improve her cholesterol levels with intensive lifestyle modification including a low saturated fat diet, exercise and weight loss. She denies any chest pain, claudication or myalgias.  Elevated LDL on latest labs.  Otherwise, within normal limits for HDL and triglycerides.  Plan:  Decrease saturated/trans fats, increase exercise, weight loss via prudent nutritional plan.   Lab Results  Component Value Date   ALT 10 02/20/2020    AST 6 02/20/2020   ALKPHOS 121 02/20/2020   BILITOT 0.3 02/20/2020   Lab Results  Component Value Date   CHOL 195 02/20/2020   HDL 49 02/20/2020   LDLCALC 137 (H) 02/20/2020   TRIG 51 02/20/2020   CHOLHDL 4.0 02/20/2020    2. Vitamin D deficiency Discussed labs with patient today.  Brittney Farmer's Vitamin D level was 30.0 on 02/20/2020. She is currently taking prescription vitamin D 50,000 IU each week. She denies nausea, vomiting or muscle weakness.  She endorses fatigue.  -Refill Vitamin D, Ergocalciferol, (DRISDOL) 1.25 MG (50000 UNIT) CAPS capsule; Take 1 capsule (50,000 Units total) by mouth every 7 (seven) days.  Dispense: 4 capsule; Refill: 0   3. Anemia, unspecified type Discussed labs with patient today.  Brittney Farmer endorses fatigue and tiredness.  Plan:  Continue same regimen as current.  Labs stable without evidence of iron deficiency anemia.  She only has slightly low ferritin.  CBC Latest Ref Rng & Units 02/20/2020 10/31/2019 07/20/2016  WBC 3.4 - 10.8 x10E3/uL 10.8 8.3 6.7  Hemoglobin 11.1 - 15.9 g/dL 13.7 13.3 11.2  Hematocrit 34.0 - 46.6 % 43.0 40.0 34.8  Platelets 150 - 450 x10E3/uL 404 352 362   Lab Results  Component Value Date   IRON 38 02/20/2020   TIBC 284 02/20/2020   FERRITIN 103 02/20/2020   Lab Results  Component Value Date   [id number] 408 02/20/2020  4. Mood d/o- with emotional eating Discussed labs with patient today.  She is being seen and treated by Psychiatry.   Appears stressed today.  No SI/ HI  Plan:  I recommend she go to a counselor on a weekly or twice monthly basis to help support her through her emotions and help keep her on track with her health goal.  Long discussion with her regarding readiness for change, motivating factors, etc. She is not ready at this time   5. At risk for deficient intake of food Brittney Farmer was given approximately 31 minutes of deficit intake of food prevention counseling today. Brittney Farmer is at risk for eating too  few calories based on current food recall. She was encouraged to focus on meeting caloric and protein goals according to her recommended meal plan.    6. Class 3 severe obesity with serious comorbidity and body mass index (BMI) of 40.0 to 44.9 in adult, unspecified obesity type (HCC)  Brittney Farmer is currently in the action stage of change. As such, her goal is to continue with weight loss efforts. She has agreed to change plan to practicing portion control and making smarter food choices, such as increasing vegetables and decreasing simple carbohydrates.   Exercise goals: I recommend that she walk for 20 minutes daily.  Behavioral modification strategies: Work with her psychiatry team closely at this time. decreasing simple carbohydrates, no skipping meals, keeping healthy foods in the home and planning for success.  Brittney Farmer has agreed to follow-up with our clinic in 3 weeks per patient preference and desire. She was informed of the importance of frequent follow-up visits to maximize her success with intensive lifestyle modifications for her multiple health conditions.     Objective:   Blood pressure 108/69, pulse 64, temperature 97.8 F (36.6 C), height 5\' 3"  (1.6 m), weight 231 lb (104.8 kg), SpO2 98 %. Body mass index is 40.92 kg/m.  General: Cooperative, alert, well developed, in no acute distress. HEENT: Conjunctivae and lids unremarkable. Cardiovascular: Regular rhythm.  Lungs: Normal work of breathing. Neurologic: No focal deficits.   Lab Results  Component Value Date   CREATININE 0.72 02/20/2020   BUN 9 02/20/2020   NA 140 02/20/2020   K 4.1 02/20/2020   CL 103 02/20/2020   CO2 23 02/20/2020   Lab Results  Component Value Date   ALT 10 02/20/2020   AST 6 02/20/2020   ALKPHOS 121 02/20/2020   BILITOT 0.3 02/20/2020   Lab Results  Component Value Date   HGBA1C 5.1 02/20/2020   Lab Results  Component Value Date   INSULIN 5.2 02/20/2020   Lab Results  Component  Value Date   TSH 1.010 02/20/2020   Lab Results  Component Value Date   CHOL 195 02/20/2020   HDL 49 02/20/2020   LDLCALC 137 (H) 02/20/2020   TRIG 51 02/20/2020   CHOLHDL 4.0 02/20/2020   Lab Results  Component Value Date   WBC 10.8 02/20/2020   HGB 13.7 02/20/2020   HCT 43.0 02/20/2020   MCV 86 02/20/2020   PLT 404 02/20/2020   Lab Results  Component Value Date   IRON 38 02/20/2020   TIBC 284 02/20/2020   FERRITIN 103 02/20/2020   Attestation Statements:   Reviewed by clinician on day of visit: allergies, medications, problem list, medical history, surgical history, family history, social history, and previous encounter notes.  I, Water quality scientist, CMA, am acting as Location manager for [employer], DO.  I have reviewed the above documentation for  accuracy and completeness, and I agree with the above. Marjory Sneddon, D.O.  The [place] was signed into law in 2016 which includes the topic of electronic health records.  This provides immediate access to information in MyChart.  This includes consultation notes, operative notes, office notes, lab results and pathology reports.  If you have any questions about what you read please let us know at your next visit so we can discuss your concerns and take corrective action if need be.  We are right here with you.

## 2020-03-24 ENCOUNTER — Ambulatory Visit (INDEPENDENT_AMBULATORY_CARE_PROVIDER_SITE_OTHER): Payer: 59 | Admitting: Family Medicine

## 2020-03-24 ENCOUNTER — Encounter (INDEPENDENT_AMBULATORY_CARE_PROVIDER_SITE_OTHER): Payer: Self-pay | Admitting: Family Medicine

## 2020-03-24 ENCOUNTER — Other Ambulatory Visit: Payer: Self-pay

## 2020-03-24 ENCOUNTER — Ambulatory Visit: Payer: 59 | Admitting: Nurse Practitioner

## 2020-03-24 VITALS — BP 115/73 | HR 75 | Temp 98.2°F | Ht 63.0 in | Wt 227.0 lb

## 2020-03-24 DIAGNOSIS — F39 Unspecified mood [affective] disorder: Secondary | ICD-10-CM

## 2020-03-24 DIAGNOSIS — Z6841 Body Mass Index (BMI) 40.0 and over, adult: Secondary | ICD-10-CM

## 2020-03-24 DIAGNOSIS — E559 Vitamin D deficiency, unspecified: Secondary | ICD-10-CM

## 2020-03-24 DIAGNOSIS — Z9189 Other specified personal risk factors, not elsewhere classified: Secondary | ICD-10-CM

## 2020-03-24 DIAGNOSIS — K588 Other irritable bowel syndrome: Secondary | ICD-10-CM

## 2020-03-24 MED ORDER — VITAMIN D (ERGOCALCIFEROL) 1.25 MG (50000 UNIT) PO CAPS: 50000.0000 [IU] | ORAL_CAPSULE | ORAL | 0 refills | Status: DC

## 2020-03-30 NOTE — Progress Notes (Signed)
Chief Complaint:   OBESITY Brittney Farmer is here to discuss her progress with her obesity treatment plan along with follow-up of her obesity related diagnoses. Brittney Farmer is on practicing portion control and making smarter food choices, such as increasing vegetables and decreasing simple carbohydrates and states she is following her eating plan approximately 75% of the time. Brittney Farmer states she is walking 20 minutes 2-3 times per week.  Today's visit was #: 3 Starting weight: 235 lbs Starting date: 02/20/2020 Today's weight: 227 lbs Today's date: 03/24/2020 Total lbs lost to date: 8 lbs Total lbs lost since last in-office visit: 4 lbs Total weight loss percentage to date: -3.40%  Interim History: Last office visit we changed Brittney Farmer's plan to portion control/smarter choices and she did better. She has not been skipping meals as much either lately. She is trying to get in meats and vegetables. Last office visit goal was to walk 20 minutes a day. She has been doing 2 days a week or so, which is an improvement.   Plan: Brittney Farmer is on Ozempic 1 mg SQ weekly. Her appetite has improved some and she is able to eat much better than her last office visit.  Assessment/Plan:   1. Other irritable bowel syndrome Brittney Farmer had a recent flare of IBS but was still able to eat. She had just a few days of symptoms.   Plan: Brittney Farmer will keep a food journal and symptoms journal. She will also follow up with her PCP and gastroenterologist (whomeever sees you for your IBS). Stress management techniques was discussed with patient and increase exercise.  2. Vitamin D deficiency Brittney Farmer's Vitamin D level was 30 on 02/20/2020. She is currently taking prescription vitamin D 50,000 IU each week. She denies nausea, vomiting or muscle weakness.  - Reiterated importance of vitamin D (as well as calcium) to their health and wellbeing.  - Reminded Brittney Farmer that weight loss will likely improve availability of vitamin D,  thus encouraged her to continue with meal plan and their weight loss efforts to further improve this condition. - I recommend patient continue to take weekly prescription vit D 50,000 IU - Informed patient this may be a lifelong thing, and she was encouraged to continue to take the medicine until told otherwise.   - we will need to monitor levels regularly (every 3-4 mo on average) to keep levels within normal limits.  - weight loss will likely improve availability of vitamin D, thus encouraged Brittney Farmer to continue with meal plan and their weight loss efforts to further improve this condition - pt's questions and concerns regarding this condition addressed.  -Refill Vitamin D, Ergocalciferol, (DRISDOL) 1.25 MG (50000 UNIT) CAPS capsule; Take 1 capsule (50,000 Units total) by mouth every 7 (seven) days.  Dispense: 4 capsule; Refill: 0  3. Mood disorder with emotional eating Today Brittney Farmer states she's never seen phychiatric doctor and is treated with Wellbutrin, Lexapro, and Buspar by her PCP. She has not had a counselor in many years. She is doing much better.  Plan: Brittney Farmer will continue treatment, per PCP, since she is doing much better. I recommend that she continue exercise and medications. Follow up with PCP as needed, if mood worsens.   4. At risk for impaired metabolic function Due to Brittney Farmer's current state of health and medical condition(s), they are at a significantly higher risk for impaired metabolic function.  This further also puts the patient at much greater risk to also subsequently develop cardiopulmonary conditions that can negatively affect  patient's quality of life as well.  At least 9 minutes was spent on counseling Brittney Farmer about these concerns today and I stressed the importance of reversing these risks factors.  Initial goal is to lose at least 5-10% of starting weight to help reduce risk factors.  Counseling: Intensive lifestyle modifications discussed with Brittney Farmer as most  appropriate first line treatment.  she will continue to work on diet, exercise and weight loss efforts.  We will continue to reassess these conditions on a fairly regular basis in an attempt to decrease patient's overall morbidity and mortality.  5. Class 3 severe obesity with serious comorbidity and body mass index (BMI) of 40.0 to 44.9 in adult, unspecified obesity type (HCC)  Brittney Farmer is currently in the action stage of change. As such, her goal is to continue with weight loss efforts. She has agreed to change plan to practicing portion control and making smarter food choices, such as increasing vegetables and decreasing simple carbohydrates.   Exercise goals: I recommend that she walk for 20 minutes 5 days per week.  Behavioral modification strategies: Increasing lean protein intake, no skipping meals, keeping healthy foods in the home, holiday eating strategies (handout given), and planning for success.  Brittney Farmer has agreed to follow-up with our clinic in 2-3 weeks per patient preference and desire. She was informed of the importance of frequent follow-up visits to maximize her success with intensive lifestyle modifications for her multiple health conditions.   Objective:   Blood pressure 115/73, pulse 75, temperature 98.2 F (36.8 C), height 5\' 3"  (1.6 m), weight 227 lb (103 kg), SpO2 99 %. Body mass index is 40.21 kg/m.  General: Cooperative, alert, well developed, in no acute distress. HEENT: Conjunctivae and lids unremarkable. Cardiovascular: Regular rhythm.  Lungs: Normal work of breathing. Neurologic: No focal deficits.   Lab Results  Component Value Date   CREATININE 0.72 02/20/2020   BUN 9 02/20/2020   NA 140 02/20/2020   K 4.1 02/20/2020   CL 103 02/20/2020   CO2 23 02/20/2020   Lab Results  Component Value Date   ALT 10 02/20/2020   AST 6 02/20/2020   ALKPHOS 121 02/20/2020   BILITOT 0.3 02/20/2020   Lab Results  Component Value Date   HGBA1C 5.1 02/20/2020     Lab Results  Component Value Date   INSULIN 5.2 02/20/2020   Lab Results  Component Value Date   TSH 1.010 02/20/2020   Lab Results  Component Value Date   CHOL 195 02/20/2020   HDL 49 02/20/2020   LDLCALC 137 (H) 02/20/2020   TRIG 51 02/20/2020   CHOLHDL 4.0 02/20/2020   Lab Results  Component Value Date   WBC 8.2 04/02/2020   HGB 13.6 04/02/2020   HCT 41.7 04/02/2020   MCV 86 04/02/2020   PLT 382 04/02/2020   Lab Results  Component Value Date   IRON 38 02/20/2020   TIBC 284 02/20/2020   FERRITIN 103 02/20/2020   Attestation Statements:   Reviewed by clinician on day of visit: allergies, medications, problem list, medical history, surgical history, family history, social history, and previous encounter notes.  Coral Ceo, am acting as Location manager for [employer], DO.  I have reviewed the above documentation for accuracy and completeness, and I agree with the above. Marjory Sneddon, D.O.  The [place] was signed into law in 2016 which includes the topic of electronic health records.  This provides immediate access to information in MyChart.  This includes consultation notes, operative notes, office notes, lab results and pathology reports.  If you have any questions about what you read please let us know at your next visit so we can discuss your concerns and take corrective action if need be.  We are right here with you.

## 2020-04-02 ENCOUNTER — Ambulatory Visit (INDEPENDENT_AMBULATORY_CARE_PROVIDER_SITE_OTHER): Payer: 59 | Admitting: Family

## 2020-04-02 ENCOUNTER — Other Ambulatory Visit: Payer: Self-pay

## 2020-04-02 ENCOUNTER — Encounter: Payer: Self-pay | Admitting: Family

## 2020-04-02 VITALS — BP 115/80 | HR 76 | Temp 97.8°F | Ht 63.0 in | Wt 231.6 lb

## 2020-04-02 DIAGNOSIS — R112 Nausea with vomiting, unspecified: Secondary | ICD-10-CM

## 2020-04-02 DIAGNOSIS — R1010 Upper abdominal pain, unspecified: Secondary | ICD-10-CM | POA: Diagnosis not present

## 2020-04-02 MED ORDER — ONDANSETRON HCL 4 MG PO TABS: ORAL_TABLET | ORAL | 1 refills | Status: DC

## 2020-04-02 NOTE — Progress Notes (Signed)
Subjective:    Patient ID: Brittney Farmer, female    DOB: 11/20/1985, 34 y.o.   MRN: [id number]  Chief Complaint  Patient presents with  . Abdominal Pain    also pain between shoulder blade   . Nausea    black rock like stuff in stool  . Gas   Pt presents to the office today with abdominal pain that started over the last 1-2 months.  Abdominal Pain The current episode started more than 1 month ago. The onset quality is gradual. The problem occurs intermittently. The problem has been waxing and waning. The pain is located in the RUQ and LUQ. The pain is at a severity of 7/10. The pain is moderate. The abdominal pain radiates to the right shoulder, left shoulder and back. Associated symptoms include belching, diarrhea, flatus, nausea and vomiting. Pertinent negatives include no dysuria, fever, hematuria or myalgias. The pain is aggravated by eating (fried foods). The pain is relieved by nothing. She has tried H2 blockers, antacids, acetaminophen and proton pump inhibitors for the symptoms. The treatment provided mild relief.      Review of Systems  Constitutional: Negative for fever.  Gastrointestinal: Positive for abdominal pain, diarrhea, flatus, nausea and vomiting.  Genitourinary: Negative for dysuria and hematuria.  Musculoskeletal: Negative for myalgias.  All other systems reviewed and are negative.      Objective:   Physical Exam Vitals reviewed.  Constitutional:      General: She is not in acute distress.    Appearance: She is well-developed. She is obese.  HENT:     Head: Normocephalic and atraumatic.     Right Ear: External ear normal.  Eyes:     Pupils: Pupils are equal, round, and reactive to light.  Neck:     Thyroid: No thyromegaly.  Cardiovascular:     Rate and Rhythm: Normal rate and regular rhythm.     Heart sounds: Normal heart sounds. No murmur heard.   Pulmonary:     Effort: Pulmonary effort is normal. No respiratory distress.     Breath sounds:  Normal breath sounds. No wheezing.  Abdominal:     General: Bowel sounds are normal. There is no distension.     Palpations: Abdomen is soft.     Tenderness: There is abdominal tenderness (mild) in the left upper quadrant.  Musculoskeletal:        General: No tenderness. Normal range of motion.     Cervical back: Normal range of motion and neck supple.  Skin:    General: Skin is warm and dry.  Neurological:     Mental Status: She is alert and oriented to person, place, and time.     Cranial Nerves: No cranial nerve deficit.     Deep Tendon Reflexes: Reflexes are normal and symmetric.  Psychiatric:        Behavior: Behavior normal.        Thought Content: Thought content normal.        Judgment: Judgment normal.       BP 115/80   Pulse 76   Temp 97.8 F (36.6 C) (Temporal)   Ht 5\' 3"  (1.6 m)   Wt 231 lb 9.6 oz (105.1 kg)   SpO2 100%   BMI 41.03 kg/m      Assessment & Plan:  Brittney Farmer comes in today with chief complaint of Abdominal Pain (also pain between shoulder blade ), Nausea (black rock like stuff in stool), and Gas   Diagnosis and orders addressed:  1. Pain of upper abdome - US Abdomen Complete; Future - CBC with Differential/Platelet - H Pylori, IGM, IGG, IGA AB - ondansetron (ZOFRAN) 4 MG tablet; TAKE 1 TABLET BY MOUTH EVERY 8 HOURS AS NEEDED FOR NAUSEA AND VOMITING  Dispense: 30 tablet; Refill: 1  2. Nausea and vomiting, intractability of vomiting not specified, unspecified vomiting type - US Abdomen Complete; Future - CBC with Differential/Platelet - H Pylori, IGM, IGG, IGA AB - ondansetron (ZOFRAN) 4 MG tablet; TAKE 1 TABLET BY MOUTH EVERY 8 HOURS AS NEEDED FOR NAUSEA AND VOMITING  Dispense: 30 tablet; Refill: 1   Labs pending Avoid fried and fatty foods Force fluids Zofran as needed  RTO if symptoms worsen and red flags discussed to go to ED  Evelina Dun, FNP

## 2020-04-02 NOTE — Patient Instructions (Signed)

## 2020-04-03 LAB — CBC WITH DIFFERENTIAL/PLATELET
Basophils Absolute: 0.1 10*3/uL (ref 0.0–0.2)
Basos: 1 %
EOS (ABSOLUTE): 0.1 10*3/uL (ref 0.0–0.4)
Eos: 2 %
Hematocrit: 41.7 % (ref 34.0–46.6)
Hemoglobin: 13.6 g/dL (ref 11.1–15.9)
Immature Grans (Abs): 0 10*3/uL (ref 0.0–0.1)
Immature Granulocytes: 0 %
Lymphocytes Absolute: 2.4 10*3/uL (ref 0.7–3.1)
Lymphs: 29 %
MCH: 27.9 pg (ref 26.6–33.0)
MCHC: 32.6 g/dL (ref 31.5–35.7)
MCV: 86 fL (ref 79–97)
Monocytes Absolute: 0.5 10*3/uL (ref 0.1–0.9)
Monocytes: 7 %
Neutrophils Absolute: 5.1 10*3/uL (ref 1.4–7.0)
Neutrophils: 61 %
Platelets: 382 10*3/uL (ref 150–450)
RBC: 4.88 x10E6/uL (ref 3.77–5.28)
RDW: 13.2 % (ref 11.7–15.4)
WBC: 8.2 10*3/uL (ref 3.4–10.8)

## 2020-04-03 LAB — H PYLORI, IGM, IGG, IGA AB
H pylori, IgM Abs: <9 units (ref 0.0–8.9)
H. pylori, IgA Abs: <9 units (ref 0.0–8.9)
H. pylori, IgG AbS: 1.31 Index Value — ABNORMAL HIGH (ref 0.00–0.79)

## 2020-04-06 ENCOUNTER — Telehealth: Payer: Self-pay | Admitting: Family

## 2020-04-06 NOTE — Telephone Encounter (Signed)
Lmtcb.

## 2020-04-06 NOTE — Telephone Encounter (Signed)
Hpylori IGg means she has had in th epast but rest of results show she does not have right now.

## 2020-04-06 NOTE — Telephone Encounter (Signed)
Patient aware of lab results - Please review H Pylori results and advise what needs to be done.  Also pateint wants to know if she should still get her abd [place] on Monday? Covering PCP- please advise

## 2020-04-09 ENCOUNTER — Encounter (INDEPENDENT_AMBULATORY_CARE_PROVIDER_SITE_OTHER): Payer: Self-pay | Admitting: Family Medicine

## 2020-04-09 ENCOUNTER — Ambulatory Visit (INDEPENDENT_AMBULATORY_CARE_PROVIDER_SITE_OTHER): Payer: 59 | Admitting: Family Medicine

## 2020-04-09 ENCOUNTER — Other Ambulatory Visit: Payer: Self-pay

## 2020-04-09 VITALS — BP 101/68 | HR 94 | Temp 97.5°F | Ht 63.0 in | Wt 224.0 lb

## 2020-04-09 DIAGNOSIS — Z6839 Body mass index (BMI) 39.0-39.9, adult: Secondary | ICD-10-CM

## 2020-04-09 DIAGNOSIS — K588 Other irritable bowel syndrome: Secondary | ICD-10-CM | POA: Diagnosis not present

## 2020-04-09 DIAGNOSIS — E559 Vitamin D deficiency, unspecified: Secondary | ICD-10-CM

## 2020-04-09 DIAGNOSIS — Z9189 Other specified personal risk factors, not elsewhere classified: Secondary | ICD-10-CM

## 2020-04-09 MED ORDER — VITAMIN D (ERGOCALCIFEROL) 1.25 MG (50000 UNIT) PO CAPS: 50000.0000 [IU] | ORAL_CAPSULE | ORAL | 0 refills | Status: DC

## 2020-04-09 NOTE — Progress Notes (Signed)
Chief Complaint:   OBESITY Brittney Farmer is here to discuss her progress with her obesity treatment plan along with follow-up of her obesity related diagnoses. Brittney Farmer is on practicing portion control and making smarter food choices, such as increasing vegetables and decreasing simple carbohydrates and states she is following her eating plan approximately 50% of the time. Brittney Farmer states she is exercising 0 minutes 0 times per week (had stomach virus).  Today's visit was #: 4 Starting weight: 235 lbs Starting date: 02/20/2020 Today's weight: 224 lbs Today's date: 04/09/2020 Total lbs lost to date: 11 lbs Total lbs lost since last in-office visit: 3 lbs Total weight loss percentage to date: -4.68%  Interim History: Brittney Farmer reports that she had a GI illness one day over the past 2 weeks and she couldn't eat. Also reports she did portion control over Thanksgiving and meal plan.  Assessment/Plan:   1. Other irritable bowel syndrome [name] was recently seen by her PCP for GI symptoms. They ordered an abdominal ultrasound, which she will be getting done this week. Also, positive for H pylori infection. She has not followed up with GI yet for her symptoms.  Plan: Call GI doctor for follow up appointment. I recommend she see GI for her ongoing GI issues, as they hinder her ability to eat healthy foods.  2. Vitamin D deficiency Brittney Farmer's Vitamin D level was 30.0 on 02/20/2020. She is currently taking prescription vitamin D 50,000 IU each week. She denies nausea, vomiting or muscle weakness.  Plan: Refill Vit D for 1 month, as per below. Continue current treatment plan. Low Vitamin D level contributes to fatigue and are associated with obesity, breast, and colon cancer. She agrees to continue to take prescription Vitamin D @50 ,000 IU every week and will follow-up for routine testing of Vitamin D, at least 2-3 times per year to avoid over-replacement.  Refill- Vitamin D, Ergocalciferol, (DRISDOL)  1.25 MG (50000 UNIT) CAPS capsule; Take 1 capsule (50,000 Units total) by mouth every 7 (seven) days.  Dispense: 4 capsule; Refill: 0  3. At risk for dehydration Brittney Farmer is at higher than average risk of dehydration. Brittney Farmer was given more than 15 minutes of proper hydration counseling today. We discussed the signs and symptoms of dehydration some of which may include muscle cramping, constipation or even orthostatic symptoms.   Counseling on the prevention of dehydration was also provided today. Brittney Farmer is at risk for dehydration due to weight loss, lifestyle and behavorial habits and possibly due to taking certain medication(s). She was encouraged to adequately hydrate and monitor fluid status to avoid dehydration as well as weight loss plateaus. Unless pre-existing renal or cardiopulmonary conditions exist which pt was told to limit their fluid intake, I recommended roughly one half of their weight in pounds to be the approximate ounces of non-caloric, non-caffeinated beverages they should drink per day; including more if they are engaging in exercise.  4. Class 2 severe obesity with serious comorbidity and body mass index (BMI) of 39.0 to 39.9 in adult, unspecified obesity type (HCC) Brittney Farmer is currently in the action stage of change. As such, her goal is to continue with weight loss efforts. She has agreed to practicing portion control and making smarter food choices, such as increasing vegetables, increase proteins, and decreasing simple carbohydrates.   Exercise goals: All adults should avoid inactivity. Some physical activity is better than none, and adults who participate in any amount of physical activity gain some health benefits.  Behavioral modification strategies: increasing lean  protein intake, decreasing simple carbohydrates, keeping healthy foods in the home, better snacking choices, holiday eating strategies  and decreasing junk food.  Brittney Farmer has agreed to follow-up with our clinic in  3-4 weeks. She was informed of the importance of frequent follow-up visits to maximize her success with intensive lifestyle modifications for her multiple health conditions.   Objective:   Blood pressure 101/68, pulse 94, temperature (!) 97.5 F (36.4 C), height 5\' 3"  (1.6 m), weight 224 lb (101.6 kg), SpO2 99 %. Body mass index is 39.68 kg/m.  General: Cooperative, alert, well developed, in no acute distress. HEENT: Conjunctivae and lids unremarkable. Cardiovascular: Regular rhythm.  Lungs: Normal work of breathing. Neurologic: No focal deficits.   Lab Results  Component Value Date   CREATININE 0.72 02/20/2020   BUN 9 02/20/2020   NA 140 02/20/2020   K 4.1 02/20/2020   CL 103 02/20/2020   CO2 23 02/20/2020   Lab Results  Component Value Date   ALT 10 02/20/2020   AST 6 02/20/2020   ALKPHOS 121 02/20/2020   BILITOT 0.3 02/20/2020   Lab Results  Component Value Date   HGBA1C 5.1 02/20/2020   Lab Results  Component Value Date   INSULIN 5.2 02/20/2020   Lab Results  Component Value Date   TSH 1.010 02/20/2020   Lab Results  Component Value Date   CHOL 195 02/20/2020   HDL 49 02/20/2020   LDLCALC 137 (H) 02/20/2020   TRIG 51 02/20/2020   CHOLHDL 4.0 02/20/2020   Lab Results  Component Value Date   WBC 8.2 04/02/2020   HGB 13.6 04/02/2020   HCT 41.7 04/02/2020   MCV 86 04/02/2020   PLT 382 04/02/2020   Lab Results  Component Value Date   IRON 38 02/20/2020   TIBC 284 02/20/2020   FERRITIN 103 02/20/2020    Attestation Statements:   Reviewed by clinician on day of visit: allergies, medications, problem list, medical history, surgical history, family history, social history, and previous encounter notes.  Coral Ceo, am acting as Location manager for [employer], DO.  I have reviewed the above documentation for accuracy and completeness, and I agree with the above. Marjory Sneddon, D.O.  The [place] was signed into law  in 2016 which includes the topic of electronic health records.  This provides immediate access to information in MyChart.  This includes consultation notes, operative notes, office notes, lab results and pathology reports.  If you have any questions about what you read please let us know at your next visit so we can discuss your concerns and take corrective action if need be.  We are right here with you.

## 2020-04-13 ENCOUNTER — Ambulatory Visit (HOSPITAL_COMMUNITY)
Admission: RE | Admit: 2020-04-13 | Discharge: 2020-04-13 | Disposition: A | Discharge status: Home / Self Care | Payer: 59 | Source: Ambulatory Visit | Attending: Family | Admitting: Family

## 2020-04-13 ENCOUNTER — Other Ambulatory Visit: Payer: Self-pay

## 2020-04-13 DIAGNOSIS — R112 Nausea with vomiting, unspecified: Secondary | ICD-10-CM

## 2020-04-13 DIAGNOSIS — R1010 Upper abdominal pain, unspecified: Secondary | ICD-10-CM

## 2020-05-05 ENCOUNTER — Ambulatory Visit: Payer: 59 | Admitting: Family

## 2020-05-07 ENCOUNTER — Ambulatory Visit (INDEPENDENT_AMBULATORY_CARE_PROVIDER_SITE_OTHER): Payer: 59 | Admitting: Family Medicine

## 2020-05-07 ENCOUNTER — Encounter (INDEPENDENT_AMBULATORY_CARE_PROVIDER_SITE_OTHER): Payer: Self-pay | Admitting: Family Medicine

## 2020-05-07 ENCOUNTER — Other Ambulatory Visit: Payer: Self-pay

## 2020-05-07 VITALS — BP 105/69 | HR 73 | Temp 98.4°F | Ht 63.0 in | Wt 223.0 lb

## 2020-05-07 DIAGNOSIS — E559 Vitamin D deficiency, unspecified: Secondary | ICD-10-CM | POA: Diagnosis not present

## 2020-05-07 DIAGNOSIS — Z6839 Body mass index (BMI) 39.0-39.9, adult: Secondary | ICD-10-CM

## 2020-05-07 DIAGNOSIS — Z9189 Other specified personal risk factors, not elsewhere classified: Secondary | ICD-10-CM

## 2020-05-07 MED ORDER — VITAMIN D (ERGOCALCIFEROL) 1.25 MG (50000 UNIT) PO CAPS: 50000.0000 [IU] | ORAL_CAPSULE | ORAL | 0 refills | Status: DC

## 2020-05-12 NOTE — Progress Notes (Signed)
Chief Complaint:   OBESITY Melena is here to discuss her progress with her obesity treatment plan along with follow-up of her obesity related diagnoses. Brittney Farmer is on practicing portion control and making smarter food choices, such as increasing vegetables and decreasing simple carbohydrates and states she is following her eating plan approximately 75% of the time. Brittney Farmer states she is walking 30 minutes 2-3 times per week.  Today's visit was #: 5 Starting weight: 235 lbs Starting date: 02/20/2020 Today's weight: 223 lbs Today's date: 05/07/2020 Total lbs lost to date: 12 lbs Total lbs lost since last in-office visit: 1 lb Total weight loss percentage to date: -5.11%  Interim History: Brittney Farmer reports that she tried to hold herself back from what she would usually eat over the holidays. Patient used to be on Category 1 prior, but says she likes having the flexibility of [place]. She's had slower weight loss, but is still losing on it.  Plan: [name] is taking Ozempic 1 mg weekly, given to her by her PCP 2 months ago. We can consider changing to [facility] at a slightly higher dose in the future prn. Patient feels no need to increase dose today and I discussed with Kmari that Rybelsus would be a good choice, as well.  Assessment/Plan:   1. Vitamin D deficiency Brittney Farmer's Vitamin D level was 30 on 02/20/2020. She is currently taking prescription vitamin D 50,000 IU each week. She denies nausea, vomiting or muscle weakness.  Ref. Range 02/20/2020 10:12  Vitamin D, 25-Hydroxy Latest Ref Range: 30.0 - 100.0 ng/mL 30.0   Plan: Refill Vit D for 1 month, as per below. Low Vitamin D level contributes to fatigue and are associated with obesity, breast, and colon cancer. She agrees to continue to take prescription Vitamin D @50 ,000 IU every week and will follow-up for routine testing of Vitamin D, at least 2-3 times per year to avoid over-replacement.  Refill- Vitamin D, Ergocalciferol, (DRISDOL)  1.25 MG (50000 UNIT) CAPS capsule; Take 1 capsule (50,000 Units total) by mouth every 7 (seven) days.  Dispense: 4 capsule; Refill: 0  2. At risk for activity intolerance Brittney Farmer was given approximately 10 minutes of exercise intolerance counseling today. She is 35 y.o. female and has risk factors exercise intolerance including obesity. We discussed intensive lifestyle modifications today with an emphasis on specific weight loss instructions and strategies. Brittney Farmer will slowly increase activity as tolerated.  3. Class 2 severe obesity with serious comorbidity and body mass index (BMI) of 39.0 to 39.9 in adult, unspecified obesity type (HCC) Brittney Farmer is currently in the action stage of change. As such, her goal is to continue with weight loss efforts. She has agreed to practicing portion control and making smarter food choices, such as increasing vegetables and decreasing simple carbohydrates.   Exercise goals: Increase/start 30 minutes of exercise every other day (4 days a week)  Behavioral modification strategies: increasing water intake, meal planning and cooking strategies, keeping healthy foods in the home and planning for success.  Brittney Farmer has agreed to follow-up with our clinic in 3 weeks. She was informed of the importance of frequent follow-up visits to maximize her success with intensive lifestyle modifications for her multiple health conditions.   Objective:   Blood pressure 105/69, pulse 73, temperature 98.4 F (36.9 C), height 5\' 3"  (1.6 m), weight 223 lb (101.2 kg), SpO2 98 %. Body mass index is 39.5 kg/m.  General: Cooperative, alert, well developed, in no acute distress. HEENT: Conjunctivae and lids unremarkable. Cardiovascular:  Regular rhythm.  Lungs: Normal work of breathing. Neurologic: No focal deficits.   Lab Results  Component Value Date   CREATININE 0.72 02/20/2020   BUN 9 02/20/2020   NA 140 02/20/2020   K 4.1 02/20/2020   CL 103 02/20/2020   CO2 23 02/20/2020    Lab Results  Component Value Date   ALT 10 02/20/2020   AST 6 02/20/2020   ALKPHOS 121 02/20/2020   BILITOT 0.3 02/20/2020   Lab Results  Component Value Date   HGBA1C 5.1 02/20/2020   Lab Results  Component Value Date   INSULIN 5.2 02/20/2020   Lab Results  Component Value Date   TSH 1.010 02/20/2020   Lab Results  Component Value Date   CHOL 195 02/20/2020   HDL 49 02/20/2020   LDLCALC 137 (H) 02/20/2020   TRIG 51 02/20/2020   CHOLHDL 4.0 02/20/2020   Lab Results  Component Value Date   WBC 8.2 04/02/2020   HGB 13.6 04/02/2020   HCT 41.7 04/02/2020   MCV 86 04/02/2020   PLT 382 04/02/2020   Lab Results  Component Value Date   IRON 38 02/20/2020   TIBC 284 02/20/2020   FERRITIN 103 02/20/2020    Attestation Statements:   Reviewed by clinician on day of visit: allergies, medications, problem list, medical history, surgical history, family history, social history, and previous encounter notes.  Coral Ceo, am acting as Location manager for [employer], DO.  I have reviewed the above documentation for accuracy and completeness, and I agree with the above. Marjory Sneddon, D.O.  The [place] was signed into law in 2016 which includes the topic of electronic health records.  This provides immediate access to information in MyChart.  This includes consultation notes, operative notes, office notes, lab results and pathology reports.  If you have any questions about what you read please let us know at your next visit so we can discuss your concerns and take corrective action if need be.  We are right here with you.

## 2020-05-18 ENCOUNTER — Encounter: Payer: Self-pay | Admitting: Family

## 2020-05-18 ENCOUNTER — Ambulatory Visit (INDEPENDENT_AMBULATORY_CARE_PROVIDER_SITE_OTHER): Payer: 59 | Admitting: Family

## 2020-05-18 ENCOUNTER — Other Ambulatory Visit: Payer: Self-pay

## 2020-05-18 DIAGNOSIS — F411 Generalized anxiety disorder: Secondary | ICD-10-CM

## 2020-05-18 DIAGNOSIS — K219 Gastro-esophageal reflux disease without esophagitis: Secondary | ICD-10-CM | POA: Diagnosis not present

## 2020-05-18 DIAGNOSIS — R1010 Upper abdominal pain, unspecified: Secondary | ICD-10-CM

## 2020-05-18 DIAGNOSIS — J301 Allergic rhinitis due to pollen: Secondary | ICD-10-CM

## 2020-05-18 DIAGNOSIS — G43109 Migraine with aura, not intractable, without status migrainosus: Secondary | ICD-10-CM

## 2020-05-18 DIAGNOSIS — F339 Major depressive disorder, recurrent, unspecified: Secondary | ICD-10-CM

## 2020-05-18 DIAGNOSIS — E559 Vitamin D deficiency, unspecified: Secondary | ICD-10-CM

## 2020-05-18 MED ORDER — TOPIRAMATE 50 MG PO TABS
50.0000 mg | ORAL_TABLET | Freq: Two times a day (BID) | ORAL | 2 refills | Status: DC
Start: 2020-05-18 — End: 2020-08-17

## 2020-05-18 NOTE — Progress Notes (Signed)
Virtual Visit via telephone Note Due to COVID-19 pandemic this visit was conducted virtually. This visit type was conducted due to national recommendations for restrictions regarding the COVID-19 Pandemic (e.g. social distancing, sheltering in place) in an effort to limit this patient's exposure and mitigate transmission in our community. All issues noted in this document were discussed and addressed.  A physical exam was not performed with this format.  I connected with Brittney Farmer on 05/18/20 at 10:47 AM  by telephone and verified that I am speaking with the correct person using two identifiers. Brittney Farmer is currently located at home and no one is currently with her during visit. The provider, Evelina Dun, FNP is located in their office at time of visit.  I discussed the limitations, risks, security and privacy concerns of performing an evaluation and management service by telephone and the availability of in person appointments. I also discussed with the patient that there may be a patient responsible charge related to this service. The patient expressed understanding and agreed to proceed.   History and Present Illness:  PT presents to the office today for chronic follow up. She is followed by the Weight loss clinic every 3 weeks. She reports she has lost 12 lbs.   She is complaining of recurrent upper abdominal pain. She was seen on 04/02/20 and had a negative CBC, H pylori, and US abdomen.  Migraine  This is a chronic problem. The current episode started more than 1 year ago. The problem occurs daily. The problem has been waxing and waning. The pain is located in the frontal region. The pain quality is similar to prior headaches. The pain is at a severity of 8/10. The pain is moderate. Associated symptoms include photophobia. She has tried triptans for the symptoms. The treatment provided mild relief.  Gastroesophageal Reflux She complains of belching and heartburn. This is a  chronic problem. The current episode started more than 1 year ago. The problem occurs rarely. She has tried a PPI for the symptoms. The treatment provided moderate relief.  Depression        This is a chronic problem.  The current episode started more than 1 year ago.   The onset quality is gradual.   The problem occurs intermittently.  Associated symptoms include irritable, restlessness and sad.  Associated symptoms include no helplessness and no hopelessness.  Past treatments include SSRIs - Selective serotonin reuptake inhibitors.  Past medical history includes anxiety.   Anxiety Presents for follow-up visit. Symptoms include depressed mood, irritability, nervous/anxious behavior and restlessness. Symptoms occur most days. The severity of symptoms is moderate.        Review of Systems  Constitutional: Positive for irritability.  Eyes: Positive for photophobia.  Gastrointestinal: Positive for heartburn.  Psychiatric/Behavioral: Positive for depression. The patient is nervous/anxious.   All other systems reviewed and are negative.    Observations/Objective: No SOB or distress noted   Assessment and Plan: 1. Migraine with aura and without status migrainosus, not intractable Will increase Topamax to 50 mg BID from once daily Avoid caffeine Tylenol as needed  - topiramate (TOPAMAX) 50 MG tablet; Take 1 tablet (50 mg total) by mouth 2 (two) times daily.  Dispense: 180 tablet; Refill: 2  2. Allergic rhinitis due to pollen, unspecified seasonality  3. Gastroesophageal reflux disease without esophagitis  4. Vitamin D deficiency  5. Recurrent major depressive disorder, remission status unspecified ([place])  6. GAD (generalized anxiety disorder)  7. Pain of upper abdomen Given pain  continues will need referral to GI Negative [place] and normal labs -Diet discussed- Avoid fried, spicy, citrus foods, caffeine and alcohol -Do not eat 2-3 hours before bedtime -Encouraged small frequent  meals -Avoid NSAID's - Ambulatory referral to Gastroenterology       I discussed the assessment and treatment plan with the patient. The patient was provided an opportunity to ask questions and all were answered. The patient agreed with the plan and demonstrated an understanding of the instructions.   The patient was advised to call back or seek an in-person evaluation if the symptoms worsen or if the condition fails to improve as anticipated.  The above assessment and management plan was discussed with the patient. The patient verbalized understanding of and has agreed to the management plan. Patient is aware to call the clinic if symptoms persist or worsen. Patient is aware when to return to the clinic for a follow-up visit. Patient educated on when it is appropriate to go to the emergency department.   Time call ended:  11:08 AM   I provided 21 minutes of non-face-to-face time during this encounter.    Evelina Dun, FNP

## 2020-05-21 ENCOUNTER — Encounter: Payer: Self-pay | Admitting: Internal Medicine

## 2020-05-28 ENCOUNTER — Other Ambulatory Visit: Payer: Self-pay

## 2020-05-28 ENCOUNTER — Ambulatory Visit (INDEPENDENT_AMBULATORY_CARE_PROVIDER_SITE_OTHER): Payer: 59 | Admitting: Physician Assistant

## 2020-05-28 ENCOUNTER — Encounter (INDEPENDENT_AMBULATORY_CARE_PROVIDER_SITE_OTHER): Payer: Self-pay | Admitting: Physician Assistant

## 2020-05-28 VITALS — BP 119/71 | HR 83 | Temp 97.9°F | Ht 63.0 in | Wt 221.0 lb

## 2020-05-28 DIAGNOSIS — R5383 Other fatigue: Secondary | ICD-10-CM

## 2020-05-28 DIAGNOSIS — Z6839 Body mass index (BMI) 39.0-39.9, adult: Secondary | ICD-10-CM | POA: Diagnosis not present

## 2020-05-28 DIAGNOSIS — Z9189 Other specified personal risk factors, not elsewhere classified: Secondary | ICD-10-CM | POA: Diagnosis not present

## 2020-05-28 DIAGNOSIS — E559 Vitamin D deficiency, unspecified: Secondary | ICD-10-CM | POA: Diagnosis not present

## 2020-06-01 ENCOUNTER — Encounter (INDEPENDENT_AMBULATORY_CARE_PROVIDER_SITE_OTHER): Payer: Self-pay | Admitting: Physician Assistant

## 2020-06-01 MED ORDER — SEMAGLUTIDE-WEIGHT MANAGEMENT 1 MG/0.5ML ~~LOC~~ SOAJ: 1.0000 mg | SUBCUTANEOUS | 0 refills | Status: DC

## 2020-06-01 NOTE — Progress Notes (Signed)
Chief Complaint:   OBESITY Brittney Farmer is here to discuss her progress with her obesity treatment plan along with follow-up of her obesity related diagnoses. Brittney Farmer is on practicing portion control and making smarter food choices, such as increasing vegetables and decreasing simple carbohydrates and states she is following her eating plan approximately 100% of the time. Brittney Farmer states she is doing 0 minutes 0 times per week.  Today's visit was #: 6 Starting weight: 235 lbs Starting date: 02/20/2020 Today's weight: 221 lbs Today's date: 05/28/2020 Total lbs lost to date: 14 Total lbs lost since last in-office visit: 2  Interim History: Brittney Farmer feels like portion control and smarter choices is easier because she doesn't have time to weigh out her food. She endorses hunger during the day, doesn't drink enough water, and she states she is fatigued. She is on Ozempic 1 mg but she continues to feel hungry.  Subjective:   1. Vitamin D deficiency Brittney Farmer is on Vit D weekly. Last Vit D level was 30.0. She feels fatigued throughout the day.  2. Other fatigue Brittney Farmer is not drinking enough water. She is unsure about the amount of protein that she is getting due to not being on a set plan, and not weighing her food.  3. At risk for osteoporosis Brittney Farmer is at higher risk of osteopenia and osteoporosis due to Vitamin D deficiency.   Assessment/Plan:   1. Vitamin D deficiency Low Vitamin D level contributes to fatigue and are associated with obesity, breast, and colon cancer. We will refill prescription Vitamin D for 1 month. Merlina will follow-up for routine testing of Vitamin D, at least 2-3 times per year to avoid over-replacement.  2. Other fatigue Brittney Farmer will increase her water intake and will weigh her protein.  3. At risk for osteoporosis Brittney Farmer was given approximately 15 minutes of osteoporosis prevention counseling today. Brittney Farmer is at risk for osteopenia and osteoporosis due to  her Vitamin D deficiency. She was encouraged to take her Vitamin D and follow her higher calcium diet and increase strengthening exercise to help strengthen her bones and decrease her risk of osteopenia and osteoporosis.  Repetitive spaced learning was employed today to elicit superior memory formation and behavioral change.  4. Class 2 severe obesity with serious comorbidity and body mass index (BMI) of 39.0 to 39.9 in adult, unspecified obesity type (HCC) Brittney Farmer is currently in the action stage of change. As such, her goal is to continue with weight loss efforts. She has agreed to the Category 2 Plan with 100-200 snack calories.   We discussed various medication options to help Aaralyn with her weight loss efforts and we both agreed to change Wegovy to 1 mg weekly #4 with no refills, and discontinue Ozempic.  Exercise goals: No exercise has been prescribed at this time.  Behavioral modification strategies: meal planning and cooking strategies.  Brittney Farmer has agreed to follow-up with our clinic in 2 weeks. She was informed of the importance of frequent follow-up visits to maximize her success with intensive lifestyle modifications for her multiple health conditions.   Objective:   Blood pressure 119/71, pulse 83, temperature 97.9 F (36.6 C), height 5\' 3"  (1.6 m), weight 221 lb (100.2 kg), SpO2 98 %. Body mass index is 39.15 kg/m.  General: Cooperative, alert, well developed, in no acute distress. HEENT: Conjunctivae and lids unremarkable. Cardiovascular: Regular rhythm.  Lungs: Normal work of breathing. Neurologic: No focal deficits.   Lab Results  Component Value Date   CREATININE 0.72  02/20/2020   BUN 9 02/20/2020   NA 140 02/20/2020   K 4.1 02/20/2020   CL 103 02/20/2020   CO2 23 02/20/2020   Lab Results  Component Value Date   ALT 10 02/20/2020   AST 6 02/20/2020   ALKPHOS 121 02/20/2020   BILITOT 0.3 02/20/2020   Lab Results  Component Value Date   HGBA1C 5.1  02/20/2020   Lab Results  Component Value Date   INSULIN 5.2 02/20/2020   Lab Results  Component Value Date   TSH 1.010 02/20/2020   Lab Results  Component Value Date   CHOL 195 02/20/2020   HDL 49 02/20/2020   LDLCALC 137 (H) 02/20/2020   TRIG 51 02/20/2020   CHOLHDL 4.0 02/20/2020   Lab Results  Component Value Date   WBC 8.2 04/02/2020   HGB 13.6 04/02/2020   HCT 41.7 04/02/2020   MCV 86 04/02/2020   PLT 382 04/02/2020   Lab Results  Component Value Date   IRON 38 02/20/2020   TIBC 284 02/20/2020   FERRITIN 103 02/20/2020   Attestation Statements:   Reviewed by clinician on day of visit: allergies, medications, problem list, medical history, surgical history, family history, social history, and previous encounter notes.   Wilhemena Durie, am acting as transcriptionist for [employer], PA-C.  I have reviewed the above documentation for accuracy and completeness, and I agree with the above. Abby Potash, PA-C

## 2020-06-02 ENCOUNTER — Telehealth (INDEPENDENT_AMBULATORY_CARE_PROVIDER_SITE_OTHER): Payer: Self-pay

## 2020-06-02 NOTE — Telephone Encounter (Signed)
Hey can you find out if she is talking about Ozempic? Her PCP originally prescribed Ozempic and she has been taking it so not sure why it wouldn't be covered. Please let me know. Thanks!

## 2020-06-02 NOTE — Telephone Encounter (Signed)
Yes please

## 2020-06-02 NOTE — Telephone Encounter (Signed)
PA has been initiated via [email or web address] for [employer].  Brittney Farmer (Key: Ernst Breach) QMVHQI 1MG /0.5ML auto-injectors   Form: MedImpact Medication Request Form   Determination: Wait for Determination Please wait for MedImpact 2017 to return a determination.

## 2020-06-09 NOTE — Telephone Encounter (Signed)
Appeal for Brittney Farmer has been initiated because it was denied.   Key: [id number] Medication: Wegovy 1MG /0.5ML auto-injectors   Form: Drug Appeal Form Determination: Wait for Determination Please wait for the payer to return a determination.

## 2020-06-18 ENCOUNTER — Encounter (INDEPENDENT_AMBULATORY_CARE_PROVIDER_SITE_OTHER): Payer: Self-pay | Admitting: Physician Assistant

## 2020-06-18 ENCOUNTER — Ambulatory Visit (INDEPENDENT_AMBULATORY_CARE_PROVIDER_SITE_OTHER): Payer: 59 | Admitting: Physician Assistant

## 2020-06-18 ENCOUNTER — Other Ambulatory Visit: Payer: Self-pay

## 2020-06-18 VITALS — BP 107/71 | HR 73 | Temp 97.7°F | Ht 63.0 in | Wt 216.0 lb

## 2020-06-18 DIAGNOSIS — K921 Melena: Secondary | ICD-10-CM

## 2020-06-18 DIAGNOSIS — E559 Vitamin D deficiency, unspecified: Secondary | ICD-10-CM

## 2020-06-18 DIAGNOSIS — Z6838 Body mass index (BMI) 38.0-38.9, adult: Secondary | ICD-10-CM

## 2020-06-18 DIAGNOSIS — Z9189 Other specified personal risk factors, not elsewhere classified: Secondary | ICD-10-CM

## 2020-06-18 DIAGNOSIS — E7849 Other hyperlipidemia: Secondary | ICD-10-CM | POA: Diagnosis not present

## 2020-06-18 MED ORDER — VITAMIN D (ERGOCALCIFEROL) 1.25 MG (50000 UNIT) PO CAPS: 50000.0000 [IU] | ORAL_CAPSULE | ORAL | 0 refills | Status: DC

## 2020-06-22 NOTE — Progress Notes (Signed)
Chief Complaint:   OBESITY Brittney Farmer is here to discuss her progress with her obesity treatment plan along with follow-up of her obesity related diagnoses. Brittney Farmer is on the Category 2 Plan + 100-200 snack calories and states she is following her eating plan approximately 50-75% of the time. Brittney Farmer states she is walking for 20-30 minutes 2 times per week.  Today's visit was #: 7 Starting weight: 235 lbs Starting date: 02/20/2020 Today's weight: 216 lbs Today's date: 06/18/2020 Total lbs lost to date: 19 Total lbs lost since last in-office visit: 5  Interim History: Dalisha did very well with weight loss. She has not been on Ozempic or Wegovy because insurance didn't approve them. Her hunger is much more controlled on Category 2 with extra snack calories, but she continues to feel the urge to snack.  Subjective:   1. Other hyperlipidemia Brittney Farmer is not on medications, and she denies chest pain. She is walking 2 times per week for exercise.  2. Hematochezia Brittney Farmer has had hematochezia for the last month and a half. She notes her abdominal pain has improved over the last 2 weeks. She has an appointment with GI in 2 weeks.  3. Vitamin D deficiency Brittney Farmer is on Vit D weekly, and she is tolerating it well.  4. At risk for heart disease Brittney Farmer is at a higher than average risk for cardiovascular disease due to obesity.   Assessment/Plan:   1. Other hyperlipidemia Cardiovascular risk and specific lipid/LDL goals reviewed. We discussed several lifestyle modifications today. Chidinma will continue her meal plan, and will continue to work on exercise and weight loss efforts. We will recheck labs next month. Orders and follow up as documented in patient record.   Counseling Intensive lifestyle modifications are the first line treatment for this issue. . Dietary changes: Increase soluble fiber. Decrease simple carbohydrates. . Exercise changes: Moderate to vigorous-intensity aerobic  activity 150 minutes per week if tolerated. . Lipid-lowering medications: see documented in medical record.  2. Hematochezia Brittney Farmer will follow up with GI at her previously scheduled appointment.  3. Vitamin D deficiency Low Vitamin D level contributes to fatigue and are associated with obesity, breast, and colon cancer. We will refill prescription Vitamin D for 1 month. Brittney Farmer will follow-up for routine testing of Vitamin D, at least 2-3 times per year to avoid over-replacement.  - Vitamin D, Ergocalciferol, (DRISDOL) 1.25 MG (50000 UNIT) CAPS capsule; Take 1 capsule (50,000 Units total) by mouth every 7 (seven) days.  Dispense: 4 capsule; Refill: 0  4. At risk for heart disease Brittney Farmer was given approximately 15 minutes of coronary artery disease prevention counseling today. She is 35 y.o. female and has risk factors for heart disease including obesity. We discussed intensive lifestyle modifications today with an emphasis on specific weight loss instructions and strategies.   Repetitive spaced learning was employed today to elicit superior memory formation and behavioral change.  5. Class 2 severe obesity with serious comorbidity and body mass index (BMI) of 38.0 to 38.9 in adult, unspecified obesity type (HCC) Brittney Farmer is currently in the action stage of change. As such, her goal is to continue with weight loss efforts. She has agreed to the Category 2 Plan + 100-200 snack calories.   Brittney Farmer will speak to her primary care provider in regards to [place].  Exercise goals: As is.  Behavioral modification strategies: meal planning and cooking strategies and keeping healthy foods in the home.  Brittney Farmer has agreed to follow-up with our clinic in  3 weeks. She was informed of the importance of frequent follow-up visits to maximize her success with intensive lifestyle modifications for her multiple health conditions.   Objective:   Blood pressure 107/71, pulse 73, temperature 97.7 F (36.5  C), height 5\' 3"  (1.6 m), weight 216 lb (98 kg), SpO2 100 %. Body mass index is 38.26 kg/m.  General: Cooperative, alert, well developed, in no acute distress. HEENT: Conjunctivae and lids unremarkable. Cardiovascular: Regular rhythm.  Lungs: Normal work of breathing. Neurologic: No focal deficits.   Lab Results  Component Value Date   CREATININE 0.72 02/20/2020   BUN 9 02/20/2020   NA 140 02/20/2020   K 4.1 02/20/2020   CL 103 02/20/2020   CO2 23 02/20/2020   Lab Results  Component Value Date   ALT 10 02/20/2020   AST 6 02/20/2020   ALKPHOS 121 02/20/2020   BILITOT 0.3 02/20/2020   Lab Results  Component Value Date   HGBA1C 5.1 02/20/2020   Lab Results  Component Value Date   INSULIN 5.2 02/20/2020   Lab Results  Component Value Date   TSH 1.010 02/20/2020   Lab Results  Component Value Date   CHOL 195 02/20/2020   HDL 49 02/20/2020   LDLCALC 137 (H) 02/20/2020   TRIG 51 02/20/2020   CHOLHDL 4.0 02/20/2020   Lab Results  Component Value Date   WBC 8.2 04/02/2020   HGB 13.6 04/02/2020   HCT 41.7 04/02/2020   MCV 86 04/02/2020   PLT 382 04/02/2020   Lab Results  Component Value Date   IRON 38 02/20/2020   TIBC 284 02/20/2020   FERRITIN 103 02/20/2020   Attestation Statements:   Reviewed by clinician on day of visit: allergies, medications, problem list, medical history, surgical history, family history, social history, and previous encounter notes.   Wilhemena Durie, am acting as transcriptionist for [employer], PA-C.  I have reviewed the above documentation for accuracy and completeness, and I agree with the above. Abby Potash, PA-C

## 2020-07-01 ENCOUNTER — Encounter: Payer: Self-pay | Admitting: Gastroenterology

## 2020-07-01 ENCOUNTER — Ambulatory Visit (INDEPENDENT_AMBULATORY_CARE_PROVIDER_SITE_OTHER): Payer: 59 | Admitting: Gastroenterology

## 2020-07-01 ENCOUNTER — Other Ambulatory Visit: Payer: Self-pay

## 2020-07-01 VITALS — BP 115/64 | HR 81 | Temp 97.7°F | Ht 63.0 in | Wt 219.2 lb

## 2020-07-01 DIAGNOSIS — R1319 Other dysphagia: Secondary | ICD-10-CM | POA: Insufficient documentation

## 2020-07-01 DIAGNOSIS — R1013 Epigastric pain: Secondary | ICD-10-CM | POA: Diagnosis not present

## 2020-07-01 DIAGNOSIS — K219 Gastro-esophageal reflux disease without esophagitis: Secondary | ICD-10-CM | POA: Insufficient documentation

## 2020-07-01 DIAGNOSIS — K625 Hemorrhage of anus and rectum: Secondary | ICD-10-CM

## 2020-07-01 DIAGNOSIS — R112 Nausea with vomiting, unspecified: Secondary | ICD-10-CM | POA: Insufficient documentation

## 2020-07-01 DIAGNOSIS — K582 Mixed irritable bowel syndrome: Secondary | ICD-10-CM | POA: Diagnosis not present

## 2020-07-01 MED ORDER — DICYCLOMINE HCL 10 MG PO CAPS: ORAL_CAPSULE | ORAL | 0 refills | Status: DC

## 2020-07-01 MED ORDER — PEG 3350-KCL-NA BICARB-NACL 420 G PO SOLR: 4000.0000 mL | ORAL | 0 refills | Status: DC

## 2020-07-01 NOTE — Progress Notes (Signed)
Primary Care Physician:  Sharion Balloon, FNP  Primary Gastroenterologist:  Elon Alas. Abbey Chatters, DO   Chief Complaint  Patient presents with  . diarrhea/constipation  . Nausea  . Abdominal Pain    Upper abd, lower abd, comes/goes    HPI:  Brittney Farmer is a 35 y.o. female here at the request of Evelina Dun, FNP for further evaluation of abdominal pain  Patient states she was told she had IBS years ago.  At that time she would have postprandial urgency with loose stools.  Crampy quality of abdominal pain.  Over the past several months however she has developed intermittent upper abdominal pain. Twisting severe pain. Sometimes worse with food. Sometimes wakes up due to pain.  When the pain occurs, she has to lay down, if she is real still the pain may improve but as soon as she moves it returns.  She has missed work due to pain.  Symptoms occurring about once or twice per week.  More recently pain more persistent and associated with N/V.  Pain is not associated with bowel movements.  Seems to happen mostly on Saturdays.  She works half a day on Saturdays.  Denies any significant change in diet preceding symptoms i.e. such as eating out the night before.  She does note certain foods seem to cause more upper abdominal discomfort, spicy foods, Worcestershire sauce, [place] food.  Heartburn is well controlled on omeprazole.  She has been on medication for greater than 10 years.  She does have occasional solid food dysphagia.  Denies NSAID or aspirin use.  Bowel movements are either constipation or diarrhea, half-and-half.  Does not take any medication for her stools.  Never goes more than a day without a BM.  For the past one month, brbpr. No matter if stools are soft or hard. The blood looked like "period" blood.  Diarrhea does seem to be worse with milk/dairy.  Milk/dairy also worsens her acid reflux.  EGD in [place], greater than five years ago, was normal. No prior colonoscopy.  States in the past a  nurse called her and told her she may have celiac disease but she never heard anything else from her provider.  She believes she was treated for H. pylori at some point.   Abdominal ultrasound 04/20/2020: Negative exam.  Current Outpatient Medications  Medication Sig Dispense Refill  . buPROPion (WELLBUTRIN SR) 150 MG 12 hr tablet Take 1 tablet (150 mg total) by mouth daily. 90 tablet 3  . busPIRone (BUSPAR) 5 MG tablet Take 1 tablet (5 mg total) by mouth 3 (three) times daily. 270 tablet 3  . escitalopram (LEXAPRO) 20 MG tablet Take 1 tablet (20 mg total) by mouth daily. (Needs to be seen before next refill) 90 tablet 3  . fluticasone (FLONASE) 50 MCG/ACT nasal spray Place 2 sprays into both nostrils daily. 16 g 6  . loratadine (CLARITIN) 10 MG tablet Take 1 tablet (10 mg total) by mouth daily. 30 tablet 2  . Multiple Vitamins-Minerals (WOMENS MULTIVITAMIN PO) Take by mouth.    Marland Kitchen omeprazole (PRILOSEC) 40 MG capsule Take 1 capsule (40 mg total) by mouth daily. 90 capsule 2  . ondansetron (ZOFRAN) 4 MG tablet TAKE 1 TABLET BY MOUTH EVERY 8 HOURS AS NEEDED FOR NAUSEA AND VOMITING 30 tablet 1  . rizatriptan (MAXALT) 10 MG tablet TAKE 1 TABLET BY MOUTH AS NEEDED FOR MIGRAINE. MAY REPEAT IN 2 HOURS IF NEEDED 10 tablet 1  . Semaglutide, 1 MG/DOSE, (OZEMPIC, 1 MG/DOSE,) 2  MG/1.5ML SOPN Inject 1 mg into the skin once a week. 1.5 mL 3  . topiramate (TOPAMAX) 50 MG tablet Take 1 tablet (50 mg total) by mouth 2 (two) times daily. 180 tablet 2  . Vitamin D, Ergocalciferol, (DRISDOL) 1.25 MG (50000 UNIT) CAPS capsule Take 1 capsule (50,000 Units total) by mouth every 7 (seven) days. 4 capsule 0   No current facility-administered medications for this visit.    Allergies as of 07/01/2020  . (No Known Allergies)    Past Medical History:  Diagnosis Date  . ADD (attention deficit disorder)   . ADHD   . Anemia   . Anxiety   . Back pain   . Constipation   . Depression   . GERD (gastroesophageal reflux  disease)   . IBS (irritable bowel syndrome)   . IBS (irritable bowel syndrome)   . Palpitations   . SOB (shortness of breath) on exertion   . Spine curvature, acquired   . Stomach ulcer     Past Surgical History:  Procedure Laterality Date  . HAND SURGERY Left 09/08/2016   Pinky  . TUBAL LIGATION  03/13/2019  . VAGINAL DELIVERY  03/13/2019    Family History  Problem Relation Age of Onset  . Diabetes Mother   . Hyperlipidemia Mother   . Hypertension Mother   . Heart disease Mother   . Depression Mother   . Anxiety disorder Mother   . Alcoholism Mother   . Hyperlipidemia Father   . Hypertension Father   . Alcoholism Father   . Celiac disease Neg Hx   . Inflammatory bowel disease Neg Hx   . Colon cancer Neg Hx     Social History   Socioeconomic History  . Marital status: Single    Spouse name: Truddie Crumble  . Number of children: 4  . Years of education: Not on file  . Highest education level: Not on file  Occupational History  . Occupation: Network engineer  Tobacco Use  . Smoking status: Never Smoker  . Smokeless tobacco: Never Used  Vaping Use  . Vaping Use: Never used  Substance and Sexual Activity  . Alcohol use: No  . Drug use: No  . Sexual activity: Yes  Other Topics Concern  . Not on file  Social History Narrative  . Not on file   Social Determinants of Health   Financial Resource Strain: Not on file  Food Insecurity: Not on file  Transportation Needs: Not on file  Physical Activity: Not on file  Stress: Not on file  Social Connections: Not on file  Intimate Partner Violence: Not on file      ROS:  General: Negative for anorexia, weight loss, fever, chills, fatigue, weakness. Eyes: Negative for vision changes.  ENT: Negative for hoarseness, nasal congestion.  See HPI CV: Negative for chest pain, angina, palpitations, dyspnea on exertion, peripheral edema.  Respiratory: Negative for dyspnea at rest, dyspnea on exertion, cough, sputum, wheezing.   GI: See history of present illness. GU:  Negative for dysuria, hematuria, urinary incontinence, urinary frequency, nocturnal urination.  MS: Negative for joint pain, low back pain.  Derm: Negative for rash or itching.  Neuro: Negative for weakness, abnormal sensation, seizure, frequent headaches, memory loss, confusion.  Psych: Negative for anxiety, depression, suicidal ideation, hallucinations.  Endo: Negative for unusual weight change.  Heme: Negative for bruising or bleeding. Allergy: Negative for rash or hives.    Physical Examination:  BP 115/64   Pulse 81   Temp 97.7  F (36.5 C)   Ht _0  (1.6 m)   Wt 219 lb 3.2 oz (99.4 kg)   LMP 06/15/2020   BMI 38.83 kg/m    General: Well-nourished, well-developed in no acute distress.  Head: Normocephalic, atraumatic.   Eyes: Conjunctiva pink, no icterus. Mouth: masked Neck: Supple without thyromegaly, masses, or lymphadenopathy.  Lungs: Clear to auscultation bilaterally.  Heart: Regular rate and rhythm, no murmurs rubs or gallops.  Abdomen: Bowel sounds are normal,  nondistended, no hepatosplenomegaly or masses, no abdominal bruits or    hernia , no rebound or guarding.  Mild epigastric tenderness. Rectal: Not performed Extremities: No lower extremity edema. No clubbing or deformities.  Neuro: Alert and oriented x 4 , grossly normal neurologically.  Skin: Warm and dry, no rash or jaundice.   Psych: Alert and cooperative, normal mood and affect.  Labs: Lab Results  Component Value Date   WBC 8.2 04/02/2020   HGB 13.6 04/02/2020   HCT 41.7 04/02/2020   MCV 86 04/02/2020   PLT 382 04/02/2020    Lab Results  Component Value Date   IRON 38 02/20/2020   TIBC 284 02/20/2020   FERRITIN 103 02/20/2020   Lab Results  Component Value Date   TSH 1.010 02/20/2020   Lab Results  Component Value Date   FOLATE 9.6 02/20/2020   Lab Results  Component Value Date   ALT 10 02/20/2020   AST 6 02/20/2020   ALKPHOS 121  02/20/2020   BILITOT 0.3 02/20/2020   Lab Results  Component Value Date   CREATININE 0.72 02/20/2020   BUN 9 02/20/2020   NA 140 02/20/2020   K 4.1 02/20/2020   CL 103 02/20/2020   CO2 23 02/20/2020   Lab Results  Component Value Date   FTDDUKGU54 270 02/20/2020    Imaging Studies: No results found.  Assessment:  Pleasant 34 year old female with history of suspected IBS presenting with several month history of intermittent upper abdominal pain associated with nausea/vomiting, chronic GERD/dysphagia.  Abdominal pain: Intermittent for several months.  Sometimes food related.  Recently associated with vomiting.  Unrelated to BMs.  Chronically on PPI therapy.  Denies NSAID or aspirin use.  Abdominal ultrasound unremarkable with no evidence of acute cholecystitis or cholelithiasis.  Differential diagnosis includes refractory GERD, gastritis, peptic ulcer disease, biliary.  Chronic GERD/dysphagia: Typical heartburn symptoms well controlled on PPI.  Complains of intermittent solid food dysphagia.  Possible esophageal stricture/web/ring/EOE.  IBS mixed: Chronic symptoms, typically half diarrhea, have constipation.  Does not need to take medication.  Symptoms appear to be self-limiting.  Upper abdominal pain with vomiting likely separate issue.  Rectal bleeding: Intermittent for the past month.  No associated rectal pain.  Differential includes benign anorectal source, less likely IBD/malignancy.  Plan:  1. Colonoscopy/EGD/ED with Dr. Abbey Chatters in the near future.  ASA I/II.  I have discussed the risks, alternatives, benefits with regards to but not limited to the risk of reaction to medication, bleeding, infection, perforation and the patient is agreeable to proceed. Written consent to be obtained. 2. Continue omeprazole 40 mg daily before breakfast. 3. Trial of dicyclomine 10 mg up to 4 times daily for abdominal pain.  Rx for #60 with no refills. 4. CMET, lipase, CBC, TTG IgA, IgA, CRP,  ESR.

## 2020-07-01 NOTE — Patient Instructions (Signed)
1. Please have your labs done at [place].  2. Trial of dicyclomine 10mg  up to four times daily for abdominal pain.  3. Upper endoscopy and colonoscopy as scheduled. See separate instructions.

## 2020-07-01 NOTE — H&P (View-Only) (Signed)
Primary Care Physician:  Sharion Balloon, FNP  Primary Gastroenterologist:  Elon Alas. Abbey Chatters, DO   Chief Complaint  Patient presents with  . diarrhea/constipation  . Nausea  . Abdominal Pain    Upper abd, lower abd, comes/goes    HPI:  Brittney Farmer is a 35 y.o. female here at the request of Evelina Dun, FNP for further evaluation of abdominal pain  Patient states she was told she had IBS years ago.  At that time she would have postprandial urgency with loose stools.  Crampy quality of abdominal pain.  Over the past several months however she has developed intermittent upper abdominal pain. Twisting severe pain. Sometimes worse with food. Sometimes wakes up due to pain.  When the pain occurs, she has to lay down, if she is real still the pain may improve but as soon as she moves it returns.  She has missed work due to pain.  Symptoms occurring about once or twice per week.  More recently pain more persistent and associated with N/V.  Pain is not associated with bowel movements.  Seems to happen mostly on Saturdays.  She works half a day on Saturdays.  Denies any significant change in diet preceding symptoms i.e. such as eating out the night before.  She does note certain foods seem to cause more upper abdominal discomfort, spicy foods, Worcestershire sauce, [place] food.  Heartburn is well controlled on omeprazole.  She has been on medication for greater than 10 years.  She does have occasional solid food dysphagia.  Denies NSAID or aspirin use.  Bowel movements are either constipation or diarrhea, half-and-half.  Does not take any medication for her stools.  Never goes more than a day without a BM.  For the past one month, brbpr. No matter if stools are soft or hard. The blood looked like "period" blood.  Diarrhea does seem to be worse with milk/dairy.  Milk/dairy also worsens her acid reflux.  EGD in [place], greater than five years ago, was normal. No prior colonoscopy.  States in the past a  nurse called her and told her she may have celiac disease but she never heard anything else from her provider.  She believes she was treated for H. pylori at some point.   Abdominal ultrasound 04/20/2020: Negative exam.  Current Outpatient Medications  Medication Sig Dispense Refill  . buPROPion (WELLBUTRIN SR) 150 MG 12 hr tablet Take 1 tablet (150 mg total) by mouth daily. 90 tablet 3  . busPIRone (BUSPAR) 5 MG tablet Take 1 tablet (5 mg total) by mouth 3 (three) times daily. 270 tablet 3  . escitalopram (LEXAPRO) 20 MG tablet Take 1 tablet (20 mg total) by mouth daily. (Needs to be seen before next refill) 90 tablet 3  . fluticasone (FLONASE) 50 MCG/ACT nasal spray Place 2 sprays into both nostrils daily. 16 g 6  . loratadine (CLARITIN) 10 MG tablet Take 1 tablet (10 mg total) by mouth daily. 30 tablet 2  . Multiple Vitamins-Minerals (WOMENS MULTIVITAMIN PO) Take by mouth.    Marland Kitchen omeprazole (PRILOSEC) 40 MG capsule Take 1 capsule (40 mg total) by mouth daily. 90 capsule 2  . ondansetron (ZOFRAN) 4 MG tablet TAKE 1 TABLET BY MOUTH EVERY 8 HOURS AS NEEDED FOR NAUSEA AND VOMITING 30 tablet 1  . rizatriptan (MAXALT) 10 MG tablet TAKE 1 TABLET BY MOUTH AS NEEDED FOR MIGRAINE. MAY REPEAT IN 2 HOURS IF NEEDED 10 tablet 1  . Semaglutide, 1 MG/DOSE, (OZEMPIC, 1 MG/DOSE,) 2  MG/1.5ML SOPN Inject 1 mg into the skin once a week. 1.5 mL 3  . topiramate (TOPAMAX) 50 MG tablet Take 1 tablet (50 mg total) by mouth 2 (two) times daily. 180 tablet 2  . Vitamin D, Ergocalciferol, (DRISDOL) 1.25 MG (50000 UNIT) CAPS capsule Take 1 capsule (50,000 Units total) by mouth every 7 (seven) days. 4 capsule 0   No current facility-administered medications for this visit.    Allergies as of 07/01/2020  . (No Known Allergies)    Past Medical History:  Diagnosis Date  . ADD (attention deficit disorder)   . ADHD   . Anemia   . Anxiety   . Back pain   . Constipation   . Depression   . GERD (gastroesophageal reflux  disease)   . IBS (irritable bowel syndrome)   . IBS (irritable bowel syndrome)   . Palpitations   . SOB (shortness of breath) on exertion   . Spine curvature, acquired   . Stomach ulcer     Past Surgical History:  Procedure Laterality Date  . HAND SURGERY Left 09/08/2016   Pinky  . TUBAL LIGATION  03/13/2019  . VAGINAL DELIVERY  03/13/2019    Family History  Problem Relation Age of Onset  . Diabetes Mother   . Hyperlipidemia Mother   . Hypertension Mother   . Heart disease Mother   . Depression Mother   . Anxiety disorder Mother   . Alcoholism Mother   . Hyperlipidemia Father   . Hypertension Father   . Alcoholism Father   . Celiac disease Neg Hx   . Inflammatory bowel disease Neg Hx   . Colon cancer Neg Hx     Social History   Socioeconomic History  . Marital status: Single    Spouse name: Truddie Crumble  . Number of children: 4  . Years of education: Not on file  . Highest education level: Not on file  Occupational History  . Occupation: Network engineer  Tobacco Use  . Smoking status: Never Smoker  . Smokeless tobacco: Never Used  Vaping Use  . Vaping Use: Never used  Substance and Sexual Activity  . Alcohol use: No  . Drug use: No  . Sexual activity: Yes  Other Topics Concern  . Not on file  Social History Narrative  . Not on file   Social Determinants of Health   Financial Resource Strain: Not on file  Food Insecurity: Not on file  Transportation Needs: Not on file  Physical Activity: Not on file  Stress: Not on file  Social Connections: Not on file  Intimate Partner Violence: Not on file      ROS:  General: Negative for anorexia, weight loss, fever, chills, fatigue, weakness. Eyes: Negative for vision changes.  ENT: Negative for hoarseness, nasal congestion.  See HPI CV: Negative for chest pain, angina, palpitations, dyspnea on exertion, peripheral edema.  Respiratory: Negative for dyspnea at rest, dyspnea on exertion, cough, sputum, wheezing.   GI: See history of present illness. GU:  Negative for dysuria, hematuria, urinary incontinence, urinary frequency, nocturnal urination.  MS: Negative for joint pain, low back pain.  Derm: Negative for rash or itching.  Neuro: Negative for weakness, abnormal sensation, seizure, frequent headaches, memory loss, confusion.  Psych: Negative for anxiety, depression, suicidal ideation, hallucinations.  Endo: Negative for unusual weight change.  Heme: Negative for bruising or bleeding. Allergy: Negative for rash or hives.    Physical Examination:  BP 115/64   Pulse 81   Temp 97.7  F (36.5 C)   Ht _0  (1.6 m)   Wt 219 lb 3.2 oz (99.4 kg)   LMP 06/15/2020   BMI 38.83 kg/m    General: Well-nourished, well-developed in no acute distress.  Head: Normocephalic, atraumatic.   Eyes: Conjunctiva pink, no icterus. Mouth: masked Neck: Supple without thyromegaly, masses, or lymphadenopathy.  Lungs: Clear to auscultation bilaterally.  Heart: Regular rate and rhythm, no murmurs rubs or gallops.  Abdomen: Bowel sounds are normal,  nondistended, no hepatosplenomegaly or masses, no abdominal bruits or    hernia , no rebound or guarding.  Mild epigastric tenderness. Rectal: Not performed Extremities: No lower extremity edema. No clubbing or deformities.  Neuro: Alert and oriented x 4 , grossly normal neurologically.  Skin: Warm and dry, no rash or jaundice.   Psych: Alert and cooperative, normal mood and affect.  Labs: Lab Results  Component Value Date   WBC 8.2 04/02/2020   HGB 13.6 04/02/2020   HCT 41.7 04/02/2020   MCV 86 04/02/2020   PLT 382 04/02/2020    Lab Results  Component Value Date   IRON 38 02/20/2020   TIBC 284 02/20/2020   FERRITIN 103 02/20/2020   Lab Results  Component Value Date   TSH 1.010 02/20/2020   Lab Results  Component Value Date   FOLATE 9.6 02/20/2020   Lab Results  Component Value Date   ALT 10 02/20/2020   AST 6 02/20/2020   ALKPHOS 121  02/20/2020   BILITOT 0.3 02/20/2020   Lab Results  Component Value Date   CREATININE 0.72 02/20/2020   BUN 9 02/20/2020   NA 140 02/20/2020   K 4.1 02/20/2020   CL 103 02/20/2020   CO2 23 02/20/2020   Lab Results  Component Value Date   FTDDUKGU54 270 02/20/2020    Imaging Studies: No results found.  Assessment:  Pleasant 34 year old female with history of suspected IBS presenting with several month history of intermittent upper abdominal pain associated with nausea/vomiting, chronic GERD/dysphagia.  Abdominal pain: Intermittent for several months.  Sometimes food related.  Recently associated with vomiting.  Unrelated to BMs.  Chronically on PPI therapy.  Denies NSAID or aspirin use.  Abdominal ultrasound unremarkable with no evidence of acute cholecystitis or cholelithiasis.  Differential diagnosis includes refractory GERD, gastritis, peptic ulcer disease, biliary.  Chronic GERD/dysphagia: Typical heartburn symptoms well controlled on PPI.  Complains of intermittent solid food dysphagia.  Possible esophageal stricture/web/ring/EOE.  IBS mixed: Chronic symptoms, typically half diarrhea, have constipation.  Does not need to take medication.  Symptoms appear to be self-limiting.  Upper abdominal pain with vomiting likely separate issue.  Rectal bleeding: Intermittent for the past month.  No associated rectal pain.  Differential includes benign anorectal source, less likely IBD/malignancy.  Plan:  1. Colonoscopy/EGD/ED with Dr. Abbey Chatters in the near future.  ASA I/II.  I have discussed the risks, alternatives, benefits with regards to but not limited to the risk of reaction to medication, bleeding, infection, perforation and the patient is agreeable to proceed. Written consent to be obtained. 2. Continue omeprazole 40 mg daily before breakfast. 3. Trial of dicyclomine 10 mg up to 4 times daily for abdominal pain.  Rx for #60 with no refills. 4. CMET, lipase, CBC, TTG IgA, IgA, CRP,  ESR.

## 2020-07-02 LAB — IGA: IgA/Immunoglobulin A, Serum: 280 mg/dL (ref 87–352)

## 2020-07-02 LAB — COMPREHENSIVE METABOLIC PANEL
ALT: 8 IU/L (ref 0–32)
AST: 8 IU/L (ref 0–40)
Albumin/Globulin Ratio: 1.7 (ref 1.2–2.2)
Albumin: 4.5 g/dL (ref 3.8–4.8)
Alkaline Phosphatase: 106 IU/L (ref 44–121)
BUN/Creatinine Ratio: 13 (ref 9–23)
BUN: 10 mg/dL (ref 6–20)
Bilirubin Total: 0.4 mg/dL (ref 0.0–1.2)
CO2: 22 mmol/L (ref 20–29)
Calcium: 9.5 mg/dL (ref 8.7–10.2)
Chloride: 104 mmol/L (ref 96–106)
Creatinine, Ser: 0.76 mg/dL (ref 0.57–1.00)
Globulin, Total: 2.6 g/dL (ref 1.5–4.5)
Glucose: 82 mg/dL (ref 65–99)
Potassium: 4.8 mmol/L (ref 3.5–5.2)
Sodium: 140 mmol/L (ref 134–144)
Total Protein: 7.1 g/dL (ref 6.0–8.5)
eGFR: 107 mL/min/{1.73_m2} (ref >59)

## 2020-07-02 LAB — TISSUE TRANSGLUTAMINASE, IGA: Transglutaminase IgA: <2 U/mL (ref 0–3)

## 2020-07-02 LAB — CBC WITH DIFFERENTIAL/PLATELET
Basophils Absolute: 0 10*3/uL (ref 0.0–0.2)
Basos: 1 %
EOS (ABSOLUTE): 0.1 10*3/uL (ref 0.0–0.4)
Eos: 2 %
Hematocrit: 43.2 % (ref 34.0–46.6)
Hemoglobin: 14.1 g/dL (ref 11.1–15.9)
Immature Grans (Abs): 0 10*3/uL (ref 0.0–0.1)
Immature Granulocytes: 0 %
Lymphocytes Absolute: 2.1 10*3/uL (ref 0.7–3.1)
Lymphs: 27 %
MCH: 28.4 pg (ref 26.6–33.0)
MCHC: 32.6 g/dL (ref 31.5–35.7)
MCV: 87 fL (ref 79–97)
Monocytes Absolute: 0.4 10*3/uL (ref 0.1–0.9)
Monocytes: 6 %
Neutrophils Absolute: 4.9 10*3/uL (ref 1.4–7.0)
Neutrophils: 64 %
Platelets: 353 10*3/uL (ref 150–450)
RBC: 4.96 x10E6/uL (ref 3.77–5.28)
RDW: 13 % (ref 11.7–15.4)
WBC: 7.5 10*3/uL (ref 3.4–10.8)

## 2020-07-02 LAB — C-REACTIVE PROTEIN: CRP: 12 mg/L — ABNORMAL HIGH (ref 0–10)

## 2020-07-02 LAB — SEDIMENTATION RATE: Sed Rate: 23 mm/hr (ref 0–32)

## 2020-07-02 LAB — LIPASE: Lipase: 23 U/L (ref 14–72)

## 2020-07-06 NOTE — Progress Notes (Signed)
CC'ED TO PCP 

## 2020-07-09 ENCOUNTER — Other Ambulatory Visit: Payer: Self-pay | Admitting: Gastroenterology

## 2020-07-14 ENCOUNTER — Ambulatory Visit (INDEPENDENT_AMBULATORY_CARE_PROVIDER_SITE_OTHER): Payer: 59 | Admitting: Physician Assistant

## 2020-07-14 ENCOUNTER — Encounter (INDEPENDENT_AMBULATORY_CARE_PROVIDER_SITE_OTHER): Payer: Self-pay | Admitting: Physician Assistant

## 2020-07-14 ENCOUNTER — Other Ambulatory Visit: Payer: Self-pay

## 2020-07-14 VITALS — BP 101/68 | HR 80 | Temp 97.7°F | Ht 63.0 in | Wt 212.0 lb

## 2020-07-14 DIAGNOSIS — Z9189 Other specified personal risk factors, not elsewhere classified: Secondary | ICD-10-CM

## 2020-07-14 DIAGNOSIS — F3289 Other specified depressive episodes: Secondary | ICD-10-CM | POA: Diagnosis not present

## 2020-07-14 DIAGNOSIS — Z6837 Body mass index (BMI) 37.0-37.9, adult: Secondary | ICD-10-CM

## 2020-07-14 DIAGNOSIS — E559 Vitamin D deficiency, unspecified: Secondary | ICD-10-CM

## 2020-07-14 DIAGNOSIS — F339 Major depressive disorder, recurrent, unspecified: Secondary | ICD-10-CM

## 2020-07-14 DIAGNOSIS — F411 Generalized anxiety disorder: Secondary | ICD-10-CM

## 2020-07-14 MED ORDER — BUPROPION HCL ER (SR) 150 MG PO TB12: 150.0000 mg | ORAL_TABLET | Freq: Every day | ORAL | 3 refills | Status: DC

## 2020-07-14 MED ORDER — SAXENDA 18 MG/3ML ~~LOC~~ SOPN: 3.0000 mg | PEN_INJECTOR | Freq: Every day | SUBCUTANEOUS | 0 refills | Status: DC

## 2020-07-14 MED ORDER — BD PEN NEEDLE NANO 2ND GEN 32G X 4 MM MISC: 0 refills | Status: DC

## 2020-07-14 MED ORDER — VITAMIN D (ERGOCALCIFEROL) 1.25 MG (50000 UNIT) PO CAPS: 50000.0000 [IU] | ORAL_CAPSULE | ORAL | 0 refills | Status: DC

## 2020-07-16 ENCOUNTER — Ambulatory Visit (INDEPENDENT_AMBULATORY_CARE_PROVIDER_SITE_OTHER): Payer: 59 | Admitting: Physician Assistant

## 2020-07-16 NOTE — Progress Notes (Signed)
Chief Complaint:   OBESITY Brittney Farmer is here to discuss her progress with her obesity treatment plan along with follow-up of her obesity related diagnoses. Brittney Farmer is on the Category 2 Plan + 100-200 snack calories and states she is following her eating plan approximately 75% of the time. Brittney Farmer states she is walking for 20 minutes 7 times per week.  Today's visit was #: 8 Starting weight: 235 lbs Starting date: 02/20/2020 Today's weight: 212 lbs Today's date: 07/14/2020 Total lbs lost to date: 23 Total lbs lost since last in-office visit: 4  Interim History: Brittney Farmer did well with weight loss. She has been very busy and she has had trouble finding the time to eat. She is on Ozempic 1 mg which has decreased her appetite, but she feels it is not as effective as it was.  Subjective:   1. Vitamin D deficiency Brittney Farmer is on Vit D, and she denies nausea, vomiting, or muscle weakness.  2. Other depression, with emotional eating Brittney Farmer denies suicidal or homicidal ideas. She notes Wellbutrin helps with cravings.  3. At risk for osteoporosis Brittney Farmer is at higher risk of osteopenia and osteoporosis due to Vitamin D deficiency.   Assessment/Plan:   1. Vitamin D deficiency Low Vitamin D level contributes to fatigue and are associated with obesity, breast, and colon cancer. We will refill prescription Vitamin D for 1 month. Kelena will follow-up for routine testing of Vitamin D, at least 2-3 times per year to avoid over-replacement.  - Vitamin D, Ergocalciferol, (DRISDOL) 1.25 MG (50000 UNIT) CAPS capsule; Take 1 capsule (50,000 Units total) by mouth every 7 (seven) days.  Dispense: 4 capsule; Refill: 0  2. Other depression, with emotional eating Behavior modification techniques were discussed today to help Emarie deal with her emotional/non-hunger eating behaviors. We will refill Wellbutrin SR for 1 month. Orders and follow up as documented in patient record.   - buPROPion  (WELLBUTRIN SR) 150 MG 12 hr tablet; Take 1 tablet (150 mg total) by mouth daily.  Dispense: 90 tablet; Refill: 3  3. At risk for osteoporosis Brittney Farmer was given approximately 15 minutes of osteoporosis prevention counseling today. Brittney Farmer is at risk for osteopenia and osteoporosis due to her Vitamin D deficiency. She was encouraged to take her Vitamin D and follow her higher calcium diet and increase strengthening exercise to help strengthen her bones and decrease her risk of osteopenia and osteoporosis.  Repetitive spaced learning was employed today to elicit superior memory formation and behavioral change.  4. Class 2 severe obesity with serious comorbidity and body mass index (BMI) of 37.0 to 37.9 in adult, unspecified obesity type (HCC) Brittney Farmer is currently in the action stage of change. As such, her goal is to continue with weight loss efforts. She has agreed to the Category 2 Plan.   We discussed various medication options to help Brittney Farmer with her weight loss efforts and we both agreed to start Saxenda 3 mg with no refills, and pen needles #100 with no refills.  - Liraglutide -Weight Management (SAXENDA) 18 MG/3ML SOPN; Inject 3 mg into the skin daily.  Dispense: 18 mL; Refill: 0 - Insulin Pen Needle (BD PEN NEEDLE NANO 2ND GEN) 32G X 4 MM MISC; Use with saxenda daily  Dispense: 100 each; Refill: 0  Exercise goals: As is.  Behavioral modification strategies: meal planning and cooking strategies and keeping healthy foods in the home.  Brittney Farmer has agreed to follow-up with our clinic in 2 to 3 weeks. She was informed  of the importance of frequent follow-up visits to maximize her success with intensive lifestyle modifications for her multiple health conditions.   Objective:   Blood pressure 101/68, pulse 80, temperature 97.7 F (36.5 C), height 5\' 3"  (1.6 m), weight 212 lb (96.2 kg), last menstrual period 06/15/2020, SpO2 99 %. Body mass index is 37.55 kg/m.  General: Cooperative,  alert, well developed, in no acute distress. HEENT: Conjunctivae and lids unremarkable. Cardiovascular: Regular rhythm.  Lungs: Normal work of breathing. Neurologic: No focal deficits.   Lab Results  Component Value Date   CREATININE 0.76 07/01/2020   BUN 10 07/01/2020   NA 140 07/01/2020   K 4.8 07/01/2020   CL 104 07/01/2020   CO2 22 07/01/2020   Lab Results  Component Value Date   ALT 8 07/01/2020   AST 8 07/01/2020   ALKPHOS 106 07/01/2020   BILITOT 0.4 07/01/2020   Lab Results  Component Value Date   HGBA1C 5.1 02/20/2020   Lab Results  Component Value Date   INSULIN 5.2 02/20/2020   Lab Results  Component Value Date   TSH 1.010 02/20/2020   Lab Results  Component Value Date   CHOL 195 02/20/2020   HDL 49 02/20/2020   LDLCALC 137 (H) 02/20/2020   TRIG 51 02/20/2020   CHOLHDL 4.0 02/20/2020   Lab Results  Component Value Date   WBC 7.5 07/01/2020   HGB 14.1 07/01/2020   HCT 43.2 07/01/2020   MCV 87 07/01/2020   PLT 353 07/01/2020   Lab Results  Component Value Date   IRON 38 02/20/2020   TIBC 284 02/20/2020   FERRITIN 103 02/20/2020   Attestation Statements:   Reviewed by clinician on day of visit: allergies, medications, problem list, medical history, surgical history, family history, social history, and previous encounter notes.   Wilhemena Durie, am acting as transcriptionist for [employer], PA-C.  I have reviewed the above documentation for accuracy and completeness, and I agree with the above. Abby Potash, PA-C

## 2020-07-20 ENCOUNTER — Telehealth (INDEPENDENT_AMBULATORY_CARE_PROVIDER_SITE_OTHER): Payer: Self-pay | Admitting: Physician Assistant

## 2020-07-20 NOTE — Telephone Encounter (Signed)
Patient's insurance will not cover Saxenda and she would like to know if there is an alternative Olivia Mackie can prescribe.

## 2020-07-24 ENCOUNTER — Other Ambulatory Visit (HOSPITAL_COMMUNITY)
Admission: RE | Admit: 2020-07-24 | Discharge: 2020-07-24 | Disposition: A | Discharge status: Home / Self Care | Payer: 59 | Source: Ambulatory Visit | Attending: Internal Medicine | Admitting: Internal Medicine

## 2020-07-24 ENCOUNTER — Other Ambulatory Visit: Payer: Self-pay

## 2020-07-24 DIAGNOSIS — Z01812 Encounter for preprocedural laboratory examination: Secondary | ICD-10-CM | POA: Insufficient documentation

## 2020-07-24 DIAGNOSIS — Z20822 Contact with and (suspected) exposure to covid-19: Secondary | ICD-10-CM | POA: Diagnosis not present

## 2020-07-24 LAB — PREGNANCY, URINE: Preg Test, Ur: NEGATIVE

## 2020-07-25 LAB — SARS CORONAVIRUS 2 (TAT 6-24 HRS): SARS Coronavirus 2: NEGATIVE

## 2020-07-27 ENCOUNTER — Ambulatory Visit (HOSPITAL_COMMUNITY)
Admission: RE | Admit: 2020-07-27 | Discharge: 2020-07-27 | Disposition: A | Discharge status: Home / Self Care | Payer: 59 | Attending: Internal Medicine | Admitting: Internal Medicine

## 2020-07-27 ENCOUNTER — Other Ambulatory Visit: Payer: Self-pay

## 2020-07-27 ENCOUNTER — Ambulatory Visit (HOSPITAL_COMMUNITY): Payer: 59 | Admitting: Anesthesiology

## 2020-07-27 ENCOUNTER — Encounter (HOSPITAL_COMMUNITY): Payer: Self-pay

## 2020-07-27 ENCOUNTER — Encounter (HOSPITAL_COMMUNITY)
Admission: RE | Disposition: A | Discharge status: Home / Self Care | Payer: Self-pay | Source: Home / Self Care | Attending: Internal Medicine

## 2020-07-27 DIAGNOSIS — K2970 Gastritis, unspecified, without bleeding: Secondary | ICD-10-CM | POA: Diagnosis not present

## 2020-07-27 DIAGNOSIS — K219 Gastro-esophageal reflux disease without esophagitis: Secondary | ICD-10-CM | POA: Insufficient documentation

## 2020-07-27 DIAGNOSIS — R1030 Lower abdominal pain, unspecified: Secondary | ICD-10-CM | POA: Diagnosis not present

## 2020-07-27 DIAGNOSIS — R12 Heartburn: Secondary | ICD-10-CM | POA: Diagnosis not present

## 2020-07-27 DIAGNOSIS — K529 Noninfective gastroenteritis and colitis, unspecified: Secondary | ICD-10-CM | POA: Insufficient documentation

## 2020-07-27 DIAGNOSIS — K5900 Constipation, unspecified: Secondary | ICD-10-CM

## 2020-07-27 DIAGNOSIS — K648 Other hemorrhoids: Secondary | ICD-10-CM | POA: Diagnosis not present

## 2020-07-27 DIAGNOSIS — R1310 Dysphagia, unspecified: Secondary | ICD-10-CM | POA: Diagnosis not present

## 2020-07-27 DIAGNOSIS — K2950 Unspecified chronic gastritis without bleeding: Secondary | ICD-10-CM | POA: Diagnosis not present

## 2020-07-27 DIAGNOSIS — Z79899 Other long term (current) drug therapy: Secondary | ICD-10-CM | POA: Insufficient documentation

## 2020-07-27 HISTORY — PX: COLONOSCOPY WITH PROPOFOL: SHX5780

## 2020-07-27 HISTORY — PX: ESOPHAGOGASTRODUODENOSCOPY (EGD) WITH PROPOFOL: SHX5813

## 2020-07-27 HISTORY — PX: BIOPSY: SHX5522

## 2020-07-27 SURGERY — COLONOSCOPY WITH PROPOFOL
Anesthesia: General

## 2020-07-27 MED ORDER — LACTATED RINGERS IV SOLN: INTRAVENOUS | Status: DC

## 2020-07-27 MED ORDER — STERILE WATER FOR IRRIGATION IR SOLN
Status: DC | PRN
  Administered 2020-07-27: 1.5 mL

## 2020-07-27 MED ORDER — PROPOFOL 10 MG/ML IV BOLUS
INTRAVENOUS | Status: DC | PRN
  Administered 2020-07-27: 100 mg via INTRAVENOUS
  Administered 2020-07-27 (×4): 50 mg via INTRAVENOUS

## 2020-07-27 MED ORDER — LIDOCAINE HCL (CARDIAC) PF 100 MG/5ML IV SOSY
PREFILLED_SYRINGE | INTRAVENOUS | Status: DC | PRN
  Administered 2020-07-27: 50 mg via INTRAVENOUS

## 2020-07-27 NOTE — Op Note (Signed)
[facility] Patient Name: Brittney Farmer Procedure Date: 07/27/2020 12:18 PM MRN: [id number] Date of Birth: 23-Dec-1985 Attending MD: Elon Alas. Abbey Chatters DO CSN: [id number] Age: 35 Admit Type: Outpatient Procedure:                Upper GI endoscopy Indications:              Epigastric abdominal pain, Heartburn Providers:                Elon Alas. Abbey Chatters, DO, [name] Kluver, Raphael Gibney,                            Technician Referring MD:              Medicines:                See the Anesthesia note for documentation of the                            administered medications Complications:            No immediate complications. Estimated Blood Loss:     Estimated blood loss was minimal. Procedure:                Pre-Anesthesia Assessment:                           - The anesthesia plan was to use monitored                            anesthesia care (MAC).                           After obtaining informed consent, the endoscope was                            passed under direct vision. Throughout the                            procedure, the patient's blood pressure, pulse, and                            oxygen saturations were monitored continuously. The                            [id number]-[id number] ([id number]) scope was introduced through the                            mouth, and advanced to the second part of duodenum.                            The upper GI endoscopy was accomplished without                            difficulty. The patient tolerated the procedure                            well. Scope In: 94:07:68 PM  Scope Out: 12:21:06 PM Total Procedure Duration: 0 hours 2 minutes 47 seconds  Findings:      There is no endoscopic evidence of Barrett's esophagus, bleeding,       esophagitis, hiatal hernia, inflammation, ulcerations or varices in the       entire esophagus.      Diffuse moderate inflammation characterized by erosions and erythema was       found in the gastric body.  Biopsies were taken with a cold forceps for       Helicobacter pylori testing.      The duodenal bulb, first portion of the duodenum and second portion of       the duodenum were normal. Biopsies for histology were taken with a cold       forceps for evaluation of celiac disease. Impression:               - Gastritis. Biopsied.                           - Normal duodenal bulb, first portion of the                            duodenum and second portion of the duodenum.                            Biopsied. Moderate Sedation:      Per Anesthesia Care Recommendation:           - Patient has a contact number available for                            emergencies. The signs and symptoms of potential                            delayed complications were discussed with the                            patient. Return to normal activities tomorrow.                            Written discharge instructions were provided to the                            patient.                           - Resume previous diet.                           - Continue present medications.                           - Await pathology results.                           - Return to GI clinic in 3 weeks. Procedure Code(s):        --- Professional ---  01027, Esophagogastroduodenoscopy, flexible,                            transoral; with biopsy, single or multiple Diagnosis Code(s):        --- Professional ---                           K29.70, Gastritis, unspecified, without bleeding                           R10.13, Epigastric pain                           R12, Heartburn CPT copyright 2019 American [employer]. All rights reserved. The codes documented in this report are preliminary and upon coder review may  be revised to meet current compliance requirements. Elon Alas. Abbey Chatters, DO [place] Abbey Chatters, DO 07/27/2020 12:25:30 PM This report has been signed electronically. Number of Addenda:  0

## 2020-07-27 NOTE — Anesthesia Procedure Notes (Signed)
Date/Time: 07/27/2020 12:29 PM Performed by: Orlie Dakin, CRNA Pre-anesthesia Checklist: Patient identified, Emergency Drugs available, Suction available and Patient being monitored Patient Re-evaluated:Patient Re-evaluated prior to induction Oxygen Delivery Method: Nasal cannula Induction Type: IV induction Placement Confirmation: positive ETCO2

## 2020-07-27 NOTE — Op Note (Signed)
[facility] Patient Name: Brittney Farmer Procedure Date: 07/27/2020 12:29 PM MRN: [id number] Date of Birth: [birth year] Attending MD: Elon Alas. Abbey Chatters DO CSN: [id number] Age: 35 Admit Type: Outpatient Procedure:                Colonoscopy Indications:              Lower abdominal pain, Constipation, Chronic diarrhea Providers:                Elon Alas. Abbey Chatters, DO, [name] Kluver, Raphael Gibney,                            Technician Referring MD:              Medicines:                See the Anesthesia note for documentation of the                            administered medications Complications:            No immediate complications. Estimated Blood Loss:     Estimated blood loss was minimal. Procedure:                Pre-Anesthesia Assessment:                           - The anesthesia plan was to use monitored                            anesthesia care (MAC).                           After obtaining informed consent, the colonoscope                            was passed under direct vision. Throughout the                            procedure, the patient's blood pressure, pulse, and                            oxygen saturations were monitored continuously. The                            [id number]-H190DL ([id number]) was introduced through the                            anus and advanced to the the terminal ileum, with                            identification of the appendiceal orifice and IC                            valve. The colonoscopy was performed without                            difficulty. The patient tolerated the procedure  well. Scope In: 12:28:15 PM Scope Out: 12:40:23 PM Scope Withdrawal Time: 0 hours 6 minutes 18 seconds  Total Procedure Duration: 0 hours 12 minutes 8 seconds  Findings:      The perianal and digital rectal examinations were normal.      Non-bleeding internal hemorrhoids were found during endoscopy.      The terminal  ileum appeared normal.      Biopsies for histology were taken with a cold forceps from the ascending       colon, transverse colon and descending colon for evaluation of       microscopic colitis. Impression:               - Non-bleeding internal hemorrhoids.                           - The examined portion of the ileum was normal.                           - Biopsies were taken with a cold forceps from the                            ascending colon, transverse colon and descending                            colon for evaluation of microscopic colitis. Moderate Sedation:      Per Anesthesia Care Recommendation:           - Patient has a contact number available for                            emergencies. The signs and symptoms of potential                            delayed complications were discussed with the                            patient. Return to normal activities tomorrow.                            Written discharge instructions were provided to the                            patient.                           - Resume previous diet.                           - Continue present medications.                           - Await pathology results.                           - Repeat colonoscopy at age 30 or sooner if higher  risk for screening purposes.                           - Return to GI clinic in 3 months. Procedure Code(s):        --- Professional ---                           [phone], Colonoscopy, flexible; with biopsy, single                            or multiple Diagnosis Code(s):        --- Professional ---                           K64.8, Other hemorrhoids                           R10.30, Lower abdominal pain, unspecified                           K59.00, Constipation, unspecified                           K52.9, Noninfective gastroenteritis and colitis,                            unspecified CPT copyright 2019 American Medical Association.  All rights reserved. The codes documented in this report are preliminary and upon coder review may  be revised to meet current compliance requirements. Elon Alas. Abbey Chatters, DO [place] Abbey Chatters, DO 07/27/2020 12:44:43 PM This report has been signed electronically. Number of Addenda: 0

## 2020-07-27 NOTE — Transfer of Care (Signed)
Immediate Anesthesia Transfer of Care Note  Patient: Shellene Sweigert  Procedure(s) Performed: COLONOSCOPY WITH PROPOFOL (N/A ) ESOPHAGOGASTRODUODENOSCOPY (EGD) WITH PROPOFOL (N/A ) BIOPSY  Patient Location: Endoscopy Unit  Anesthesia Type:General  Level of Consciousness: awake, alert  and oriented  Airway & Oxygen Therapy: Patient Spontanous Breathing  Post-op Assessment: Report given to RN and Post -op Vital signs reviewed and stable  Post vital signs: Reviewed and stable  Last Vitals:  Vitals Value Taken Time  BP 114/75 07/27/20 1244  Temp 36.9 C 07/27/20 1244  Pulse 81 07/27/20 1244  Resp 21 07/27/20 1244  SpO2 100 % 07/27/20 1244    Last Pain:  Vitals:   07/27/20 1244  TempSrc: Oral  PainSc: 0-No pain      Patients Stated Pain Goal: 7 (83/35/82 5189)  Complications: No complications documented.

## 2020-07-27 NOTE — Interval H&P Note (Signed)
History and Physical Interval Note:  07/27/2020 11:47 AM  Brittney Farmer  has presented today for surgery, with the diagnosis of rectal bleeding, epigastric pain, GERD, nausea/vomiting, irritable bowel syndrome.  The various methods of treatment have been discussed with the patient and family. After consideration of risks, benefits and other options for treatment, the patient has consented to  Procedure(s) with comments: COLONOSCOPY WITH PROPOFOL (N/A) - PM ESOPHAGOGASTRODUODENOSCOPY (EGD) WITH PROPOFOL (N/A) BALLOON DILATION (N/A) as a surgical intervention.  The patient's history has been reviewed, patient examined, no change in status, stable for surgery.  I have reviewed the patient's chart and labs.  Questions were answered to the patient's satisfaction.     Eloise Harman

## 2020-07-27 NOTE — Discharge Instructions (Addendum)
EGD Discharge instructions Please read the instructions outlined below and refer to this sheet in the next few weeks. These discharge instructions provide you with general information on caring for yourself after you leave the hospital. Your doctor may also give you specific instructions. While your treatment has been planned according to the most current medical practices available, unavoidable complications occasionally occur. If you have any problems or questions after discharge, please call your doctor. ACTIVITY  You may resume your regular activity but move at a slower pace for the next 24 hours.   Take frequent rest periods for the next 24 hours.   Walking will help expel (get rid of) the air and reduce the bloated feeling in your abdomen.   No driving for 24 hours (because of the anesthesia (medicine) used during the test).   You may shower.   Do not sign any important legal documents or operate any machinery for 24 hours (because of the anesthesia used during the test).  NUTRITION  Drink plenty of fluids.   You may resume your normal diet.   Begin with a light meal and progress to your normal diet.   Avoid alcoholic beverages for 24 hours or as instructed by your caregiver.  MEDICATIONS  You may resume your normal medications unless your caregiver tells you otherwise.  WHAT YOU CAN EXPECT TODAY  You may experience abdominal discomfort such as a feeling of fullness or "gas" pains.  FOLLOW-UP  Your doctor will discuss the results of your test with you.  SEEK IMMEDIATE MEDICAL ATTENTION IF ANY OF THE FOLLOWING OCCUR:  Excessive nausea (feeling sick to your stomach) and/or vomiting.   Severe abdominal pain and distention (swelling).   Trouble swallowing.   Temperature over 101 F (37.8 C).   Rectal bleeding or vomiting of blood.     Colonoscopy Discharge Instructions  Read the instructions outlined below and refer to this sheet in the next few weeks. These  discharge instructions provide you with general information on caring for yourself after you leave the hospital. Your doctor may also give you specific instructions. While your treatment has been planned according to the most current medical practices available, unavoidable complications occasionally occur.   ACTIVITY  You may resume your regular activity, but move at a slower pace for the next 24 hours.   Take frequent rest periods for the next 24 hours.   Walking will help get rid of the air and reduce the bloated feeling in your belly (abdomen).   No driving for 24 hours (because of the medicine (anesthesia) used during the test).    Do not sign any important legal documents or operate any machinery for 24 hours (because of the anesthesia used during the test).  NUTRITION  Drink plenty of fluids.   You may resume your normal diet as instructed by your doctor.   Begin with a light meal and progress to your normal diet. Heavy or fried foods are harder to digest and may make you feel sick to your stomach (nauseated).   Avoid alcoholic beverages for 24 hours or as instructed.  MEDICATIONS  You may resume your normal medications unless your doctor tells you otherwise.  WHAT YOU CAN EXPECT TODAY  Some feelings of bloating in the abdomen.   Passage of more gas than usual.   Spotting of blood in your stool or on the toilet paper.  IF YOU HAD POLYPS REMOVED DURING THE COLONOSCOPY:  No aspirin products for 7 days or as instructed.  No alcohol for 7 days or as instructed.   Eat a soft diet for the next 24 hours.  FINDING OUT THE RESULTS OF YOUR TEST Not all test results are available during your visit. If your test results are not back during the visit, make an appointment with your caregiver to find out the results. Do not assume everything is normal if you have not heard from your caregiver or the medical facility. It is important for you to follow up on all of your test results.   SEEK IMMEDIATE MEDICAL ATTENTION IF:  You have more than a spotting of blood in your stool.   Your belly is swollen (abdominal distention).   You are nauseated or vomiting.   You have a temperature over 101.   You have abdominal pain or discomfort that is severe or gets worse throughout the day.   Your EGD showed a large amount of inflammation in your stomach.  I took biopsies of this to rule out infection with bacteria called H. pylori.  Continue on omeprazole 40 mg daily for now.  Avoid NSAIDs.  We will call you with these results later this week.  Your colonoscopy was unremarkable.  I did not see any inflammation indicative of underlying Crohn's disease or ulcerative colitis.  No polyps or evidence of colon cancer.  Recommend repeating this at age 35 for colon cancer screening purposes.  I did take biopsies your colon as well to rule out a condition called microscopic colitis that can cause diarrhea and pain.  Follow-up with GI in 3 months.   I hope you have a great rest of your week!  Elon Alas. Abbey Chatters, D.O. Gastroenterology and Hepatology [facility]   Gastritis, Adult  Gastritis is swelling (inflammation) of the stomach. Gastritis can develop quickly (acute). It can also develop slowly over time (chronic). It is important to get help for this condition. If you do not get help, your stomach can bleed, and you can get sores (ulcers) in your stomach. What are the causes? This condition may be caused by:  Germs that get to your stomach.  Drinking too much alcohol.  Medicines you are taking.  Too much acid in the stomach.  A disease of the intestines or stomach.  Stress.  An allergic reaction.  Crohn's disease.  Some cancer treatments (radiation). Sometimes the cause of this condition is not known. What are the signs or symptoms? Symptoms of this condition include:  Pain in your stomach.  A burning feeling in your stomach.  Feeling  sick to your stomach (nauseous).  Throwing up (vomiting).  Feeling too full after you eat.  Weight loss.  Bad breath.  Throwing up blood.  Blood in your poop (stool). How is this diagnosed? This condition may be diagnosed with:  Your medical history and symptoms.  A physical exam.  Tests. These can include: ? Blood tests. ? Stool tests. ? A procedure to look inside your stomach (upper endoscopy). ? A test in which a sample of tissue is taken for testing (biopsy). How is this treated? Treatment for this condition depends on what caused it. You may be given:  Antibiotic medicine, if your condition was caused by germs.  H2 blockers and similar medicines, if your condition was caused by too much acid. Follow these instructions at home: Medicines  Take over-the-counter and prescription medicines only as told by your doctor.  If you were prescribed an antibiotic medicine, take it as told by your doctor. Do not  stop taking it even if you start to feel better. Eating and drinking  Eat small meals often, instead of large meals.  Avoid foods and drinks that make your symptoms worse.  Drink enough fluid to keep your pee (urine) pale yellow.   Alcohol use  Do not drink alcohol if: ? Your doctor tells you not to drink. ? You are pregnant, may be pregnant, or are planning to become pregnant.  If you drink alcohol: ? Limit your use to:  0-1 drink a day for women.  0-2 drinks a day for men. ? Be aware of how much alcohol is in your drink. In the [place] one drink equals one 12 oz bottle of beer (355 mL), one 5 oz glass of wine (148 mL), or one 1 oz glass of hard liquor (44 mL). General instructions  Talk with your doctor about ways to manage stress. You can exercise or do deep breathing, meditation, or yoga.  Do not smoke or use products that have nicotine or tobacco. If you need help quitting, ask your doctor.  Keep all follow-up visits as told by your doctor. This is  important. Contact a doctor if:  Your symptoms get worse.  Your symptoms go away and then come back. Get help right away if:  You throw up blood or something that looks like coffee grounds.  You have black or dark red poop.  You throw up any time you try to drink fluids.  Your stomach pain gets worse.  You have a fever.  You do not feel better after one week. Summary  Gastritis is swelling (inflammation) of the stomach.  You must get help for this condition. If you do not get help, your stomach can bleed, and you can get sores (ulcers).  This condition is diagnosed with medical history, physical exam, or tests.  You can be treated with medicines for germs or medicines to block too much acid in your stomach. This information is not intended to replace advice given to you by your health care provider. Make sure you discuss any questions you have with your health care provider. Document Revised: 09/05/2017 Document Reviewed: 09/05/2017 Elsevier Patient Education  [place].

## 2020-07-27 NOTE — Anesthesia Postprocedure Evaluation (Signed)
Anesthesia Post Note  Patient: Simi Briel  Procedure(s) Performed: COLONOSCOPY WITH PROPOFOL (N/A ) ESOPHAGOGASTRODUODENOSCOPY (EGD) WITH PROPOFOL (N/A ) BIOPSY  Patient location during evaluation: Endoscopy Anesthesia Type: General Level of consciousness: awake and alert and oriented Pain management: pain level controlled Vital Signs Assessment: post-procedure vital signs reviewed and stable Respiratory status: nonlabored ventilation, spontaneous breathing and respiratory function stable Cardiovascular status: blood pressure returned to baseline and stable Postop Assessment: no apparent nausea or vomiting Anesthetic complications: no   No complications documented.   Last Vitals:  Vitals:   07/27/20 1121 07/27/20 1244  BP: 129/78 114/75  Pulse: 84 81  Resp: 20 (!) 21  Temp: 36.6 C 36.9 C  SpO2: 100% 100%    Last Pain:  Vitals:   07/27/20 1244  TempSrc: Oral  PainSc: 0-No pain                 Orlie Dakin

## 2020-07-27 NOTE — Anesthesia Preprocedure Evaluation (Addendum)
Anesthesia Evaluation  Patient identified by MRN, date of birth, ID band Patient awake    Reviewed: Allergy & Precautions, NPO status , Patient's Chart, lab work & pertinent test results  History of Anesthesia Complications Negative for: history of anesthetic complications  Airway Mallampati: I  TM Distance: >3 FB Neck ROM: Full    Dental  (+) Dental Advisory Given [place] :   Pulmonary shortness of breath and with exertion,    Pulmonary exam normal breath sounds clear to auscultation       Cardiovascular Exercise Tolerance: Good negative cardio ROS Normal cardiovascular exam Rhythm:Regular Rate:Normal     Neuro/Psych  Headaches, PSYCHIATRIC DISORDERS Anxiety Depression    GI/Hepatic Neg liver ROS, PUD, GERD  Medicated and Controlled,  Endo/Other  negative endocrine ROS  Renal/GU negative Renal ROS     Musculoskeletal negative musculoskeletal ROS (+)   Abdominal   Peds  (+) ATTENTION 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 Hematology  (+) anemia ,   Anesthesia Other Findings   Reproductive/Obstetrics negative OB ROS                            Anesthesia Physical Anesthesia Plan  ASA: II  Anesthesia Plan: General   Post-op Pain Management:    Induction: Intravenous  PONV Risk Score and Plan: Propofol infusion  Airway Management Planned: Nasal Cannula and Natural Airway  Additional Equipment:   Intra-op Plan:   Post-operative Plan:   Informed Consent: I have reviewed the patients History and Physical, chart, labs and discussed the procedure including the risks, benefits and alternatives for the proposed anesthesia with the patient or authorized representative who has indicated his/her understanding and acceptance.     Dental advisory given  Plan Discussed with: CRNA and Surgeon  Anesthesia Plan Comments:         Anesthesia Quick Evaluation

## 2020-07-28 ENCOUNTER — Encounter (INDEPENDENT_AMBULATORY_CARE_PROVIDER_SITE_OTHER): Payer: Self-pay

## 2020-07-28 LAB — SURGICAL PATHOLOGY

## 2020-07-28 NOTE — Telephone Encounter (Signed)
Sent patient a my chart message. [name] is excluded from coverage per her insurance. Brittney Farmer, [place]

## 2020-08-03 ENCOUNTER — Encounter (HOSPITAL_COMMUNITY): Payer: Self-pay | Admitting: Internal Medicine

## 2020-08-03 ENCOUNTER — Ambulatory Visit (INDEPENDENT_AMBULATORY_CARE_PROVIDER_SITE_OTHER): Payer: 59 | Admitting: Physician Assistant

## 2020-08-06 ENCOUNTER — Other Ambulatory Visit: Payer: Self-pay

## 2020-08-06 ENCOUNTER — Ambulatory Visit (INDEPENDENT_AMBULATORY_CARE_PROVIDER_SITE_OTHER): Payer: 59 | Admitting: Physician Assistant

## 2020-08-06 ENCOUNTER — Encounter (INDEPENDENT_AMBULATORY_CARE_PROVIDER_SITE_OTHER): Payer: Self-pay | Admitting: Physician Assistant

## 2020-08-06 VITALS — BP 106/72 | HR 82 | Temp 97.8°F | Ht 63.0 in | Wt 212.0 lb

## 2020-08-06 DIAGNOSIS — E559 Vitamin D deficiency, unspecified: Secondary | ICD-10-CM

## 2020-08-06 DIAGNOSIS — R632 Polyphagia: Secondary | ICD-10-CM | POA: Diagnosis not present

## 2020-08-06 DIAGNOSIS — Z6841 Body Mass Index (BMI) 40.0 and over, adult: Secondary | ICD-10-CM | POA: Diagnosis not present

## 2020-08-06 DIAGNOSIS — Z9189 Other specified personal risk factors, not elsewhere classified: Secondary | ICD-10-CM | POA: Diagnosis not present

## 2020-08-06 MED ORDER — VITAMIN D (ERGOCALCIFEROL) 1.25 MG (50000 UNIT) PO CAPS: 50000.0000 [IU] | ORAL_CAPSULE | ORAL | 0 refills | Status: DC

## 2020-08-06 MED ORDER — TRULICITY 1.5 MG/0.5ML ~~LOC~~ SOAJ: 1.5000 mg | SUBCUTANEOUS | 0 refills | Status: DC

## 2020-08-13 NOTE — Progress Notes (Signed)
Chief Complaint:   OBESITY Brittney Farmer is here to discuss her progress with her obesity treatment plan along with follow-up of her obesity related diagnoses. Brittney Farmer is on the Category 2 Plan and states she is following her eating plan approximately 50% of the time. Brittney Farmer states she is walking for 20 minutes 2 times per week.  Today's visit was #: 9 Starting weight: 235 lbs Starting date: 02/20/2020 Today's weight: 212 lbs Today's date: 08/06/2020 Total lbs lost to date: 23 lbs Total lbs lost since last in-office visit: 0  Interim History: Nautika had a colonoscopy due to hematochezia. She was diagnosed with internal hemorrhoids. She was nauseous after the procedure and did not eat enough for a few days.  Subjective:   1. Polyphagia Brittney Farmer endorses excessive hunger. She is hungry throughout the days. Brittney Farmer was not approved by her insurance and Ozempic did not help.  2. Vitamin D deficiency Brittney Farmer's Vitamin D level was 30 on 02/20/2020. She is currently taking prescription vitamin D 50,000 IU each week. She denies nausea, vomiting or muscle weakness.  3. At risk for impaired metabolic function Brittney Farmer is at increased risk for impaired metabolic function due to current nutrition, muscle mass and not eating enough food daily.   Assessment/Plan:   1. Polyphagia Intensive lifestyle modifications are the first line treatment for this issue. We discussed several lifestyle modifications today and she will continue to work on diet, exercise and weight loss efforts. Orders and follow up as documented in patient record. Start Trulicity, as per below.  Counseling . Polyphagia is excessive hunger. . Causes can include: low blood sugars, hypERthyroidism, PMS, lack of sleep, stress, insulin resistance, diabetes, certain medications, and diets that are deficient in protein and fiber.   - Start Dulaglutide (TRULICITY) 1.5 AT/5.5DD SOPN; Inject 1.5 mg into the skin once a week.  Dispense:  2 mL; Refill: 0  2. Vitamin D deficiency Low Vitamin D level contributes to fatigue and are associated with obesity, breast, and colon cancer. She agrees to continue to take prescription Vitamin D @50 ,000 IU every week and will follow-up for routine testing of Vitamin D, at least 2-3 times per year to avoid over-replacement.  - Refill Vitamin D, Ergocalciferol, (DRISDOL) 1.25 MG (50000 UNIT) CAPS capsule; Take 1 capsule (50,000 Units total) by mouth every 7 (seven) days.  Dispense: 4 capsule; Refill: 0  3. At risk for impaired metabolic function Brittney Farmer was given approximately 15 minutes of impaired  metabolic function prevention counseling today. We discussed intensive lifestyle modifications today with an emphasis on specific nutrition and exercise instructions and strategies.   Repetitive spaced learning was employed today to elicit superior memory formation and behavioral change.  4. Obesity, current BMI 37.6 Brittney Farmer is currently in the action stage of change. As such, her goal is to continue with weight loss efforts. She has agreed to the Category 2 Plan.   Exercise goals: As is.  Behavioral modification strategies: meal planning and cooking strategies and keeping healthy foods in the home.  Brittney Farmer has agreed to follow-up with our clinic in 3 weeks. She was informed of the importance of frequent follow-up visits to maximize her success with intensive lifestyle modifications for her multiple health conditions.   Objective:   Blood pressure 106/72, pulse 82, temperature 97.8 F (36.6 C), height 5\' 3"  (1.6 m), weight 212 lb (96.2 kg), last menstrual period 07/13/2020, SpO2 100 %. Body mass index is 37.55 kg/m.  General: Cooperative, alert, well developed, in no acute  distress. HEENT: Conjunctivae and lids unremarkable. Cardiovascular: Regular rhythm.  Lungs: Normal work of breathing. Neurologic: No focal deficits.   Lab Results  Component Value Date   CREATININE 0.76 07/01/2020    BUN 10 07/01/2020   NA 140 07/01/2020   K 4.8 07/01/2020   CL 104 07/01/2020   CO2 22 07/01/2020   Lab Results  Component Value Date   ALT 8 07/01/2020   AST 8 07/01/2020   ALKPHOS 106 07/01/2020   BILITOT 0.4 07/01/2020   Lab Results  Component Value Date   HGBA1C 5.1 02/20/2020   Lab Results  Component Value Date   INSULIN 5.2 02/20/2020   Lab Results  Component Value Date   TSH 1.010 02/20/2020   Lab Results  Component Value Date   CHOL 195 02/20/2020   HDL 49 02/20/2020   LDLCALC 137 (H) 02/20/2020   TRIG 51 02/20/2020   CHOLHDL 4.0 02/20/2020   Lab Results  Component Value Date   WBC 7.5 07/01/2020   HGB 14.1 07/01/2020   HCT 43.2 07/01/2020   MCV 87 07/01/2020   PLT 353 07/01/2020   Lab Results  Component Value Date   IRON 38 02/20/2020   TIBC 284 02/20/2020   FERRITIN 103 02/20/2020   Attestation Statements:   Reviewed by clinician on day of visit: allergies, medications, problem list, medical history, surgical history, family history, social history, and previous encounter notes.  Leodis Binet Friedenbach, CMA, am acting as Location manager for [employer], PA-C.  I have reviewed the above documentation for accuracy and completeness, and I agree with the above. Abby Potash, PA-C

## 2020-08-17 ENCOUNTER — Other Ambulatory Visit: Payer: Self-pay

## 2020-08-17 ENCOUNTER — Ambulatory Visit (INDEPENDENT_AMBULATORY_CARE_PROVIDER_SITE_OTHER): Payer: 59 | Admitting: Family

## 2020-08-17 ENCOUNTER — Encounter: Payer: Self-pay | Admitting: Family

## 2020-08-17 VITALS — BP 104/71 | HR 73 | Temp 97.6°F | Ht 63.0 in | Wt 218.2 lb

## 2020-08-17 DIAGNOSIS — K219 Gastro-esophageal reflux disease without esophagitis: Secondary | ICD-10-CM

## 2020-08-17 DIAGNOSIS — F411 Generalized anxiety disorder: Secondary | ICD-10-CM | POA: Diagnosis not present

## 2020-08-17 DIAGNOSIS — F339 Major depressive disorder, recurrent, unspecified: Secondary | ICD-10-CM

## 2020-08-17 DIAGNOSIS — K582 Mixed irritable bowel syndrome: Secondary | ICD-10-CM | POA: Diagnosis not present

## 2020-08-17 DIAGNOSIS — E7849 Other hyperlipidemia: Secondary | ICD-10-CM

## 2020-08-17 DIAGNOSIS — F3289 Other specified depressive episodes: Secondary | ICD-10-CM

## 2020-08-17 DIAGNOSIS — R112 Nausea with vomiting, unspecified: Secondary | ICD-10-CM

## 2020-08-17 DIAGNOSIS — E559 Vitamin D deficiency, unspecified: Secondary | ICD-10-CM

## 2020-08-17 DIAGNOSIS — G43109 Migraine with aura, not intractable, without status migrainosus: Secondary | ICD-10-CM

## 2020-08-17 DIAGNOSIS — R1010 Upper abdominal pain, unspecified: Secondary | ICD-10-CM

## 2020-08-17 MED ORDER — TOPIRAMATE 50 MG PO TABS: 50.0000 mg | ORAL_TABLET | Freq: Two times a day (BID) | ORAL | 2 refills | Status: DC

## 2020-08-17 MED ORDER — ESCITALOPRAM OXALATE 20 MG PO TABS: 20.0000 mg | ORAL_TABLET | Freq: Every day | ORAL | 3 refills | Status: DC

## 2020-08-17 MED ORDER — DICYCLOMINE HCL 10 MG PO CAPS: 10.0000 mg | ORAL_CAPSULE | Freq: Three times a day (TID) | ORAL | 2 refills | Status: DC

## 2020-08-17 MED ORDER — BUSPIRONE HCL 5 MG PO TABS: 5.0000 mg | ORAL_TABLET | Freq: Three times a day (TID) | ORAL | 3 refills | Status: DC

## 2020-08-17 MED ORDER — BUPROPION HCL ER (SR) 150 MG PO TB12: 150.0000 mg | ORAL_TABLET | Freq: Every day | ORAL | 3 refills | Status: DC

## 2020-08-17 MED ORDER — OMEPRAZOLE 40 MG PO CPDR: 40.0000 mg | DELAYED_RELEASE_CAPSULE | Freq: Every day | ORAL | 2 refills | Status: DC

## 2020-08-17 MED ORDER — ONDANSETRON HCL 4 MG PO TABS: 4.0000 mg | ORAL_TABLET | Freq: Three times a day (TID) | ORAL | 2 refills | Status: DC | PRN

## 2020-08-17 NOTE — Patient Instructions (Signed)

## 2020-08-17 NOTE — Progress Notes (Signed)
Subjective:    Patient ID: Brittney Farmer, female    DOB: 15-Mar-1986, 35 y.o.   MRN: [id number]  Chief Complaint  Patient presents with  . Medical Management of Chronic Issues    No concerns, fasting, wants zofran written for once a day not as need and does not like trulicity. [facility] clinic does trulicity    PT presents to the office today for chronic follow up. She is followed by the Weight loss clinic every 3 weeks. She reports she has lost 28 lbs.   She is followed by GI. She had colonoscopy and EGD on 07/27/20. Marland Kitchen She was seen on 04/02/20 and had a negative CBC, H pylori, and US abdomen.  Gastroesophageal Reflux She complains of abdominal pain, belching and heartburn. She reports no early satiety. This is a chronic problem. The current episode started more than 1 year ago. The problem occurs occasionally. The problem has been waxing and waning. The symptoms are aggravated by certain foods. Risk factors include obesity. She has tried a PPI for the symptoms. The treatment provided moderate relief.  Depression        This is a chronic problem.  The current episode started more than 1 year ago.   The onset quality is gradual.   The problem occurs intermittently.  The problem has been waxing and waning since onset.  Associated symptoms include irritable and sad.  Associated symptoms include no helplessness, no hopelessness and no restlessness.  Past treatments include SNRIs - Serotonin and norepinephrine reuptake inhibitors.  Past medical history includes anxiety.   Anxiety Presents for follow-up visit. Symptoms include depressed mood, excessive worry, irritability and nervous/anxious behavior. Patient reports no restlessness. Symptoms occur most days. The severity of symptoms is moderate.    Migraine  This is a chronic problem. The current episode started more than 1 year ago. The problem occurs intermittently. The problem has been resolved (since increaseing Topamax to  50 mg BID).  Associated symptoms include abdominal pain. She has tried beta blockers for the symptoms. The treatment provided moderate relief.  Hyperlipidemia      Review of Systems  Constitutional: Positive for irritability.  Gastrointestinal: Positive for abdominal pain and heartburn.  Psychiatric/Behavioral: Positive for depression. The patient is nervous/anxious.   All other systems reviewed and are negative.      Objective:   Physical Exam Vitals reviewed.  Constitutional:      General: She is irritable. She is not in acute distress.    Appearance: She is well-developed. She is obese.  HENT:     Head: Normocephalic and atraumatic.     Right Ear: Tympanic membrane normal.     Left Ear: Tympanic membrane normal.  Eyes:     Pupils: Pupils are equal, round, and reactive to light.  Neck:     Thyroid: No thyromegaly.  Cardiovascular:     Rate and Rhythm: Normal rate and regular rhythm.     Heart sounds: Normal heart sounds. No murmur heard.   Pulmonary:     Effort: Pulmonary effort is normal. No respiratory distress.     Breath sounds: Normal breath sounds. No wheezing.  Abdominal:     General: Bowel sounds are normal. There is no distension.     Palpations: Abdomen is soft.     Tenderness: There is no abdominal tenderness.  Musculoskeletal:        General: No tenderness. Normal range of motion.     Cervical back: Normal range of motion and neck  supple.  Skin:    General: Skin is warm and dry.  Neurological:     Mental Status: She is alert and oriented to person, place, and time.     Cranial Nerves: No cranial nerve deficit.     Deep Tendon Reflexes: Reflexes are normal and symmetric.  Psychiatric:        Behavior: Behavior normal.        Thought Content: Thought content normal.        Judgment: Judgment normal.          BP 104/71   Pulse 73   Temp 97.6 F (36.4 C) (Temporal)   Ht 5\' 3"  (1.6 m)   Wt 218 lb 3.2 oz (99 kg)   BMI 38.65 kg/m   Assessment & Plan:   Brittney Farmer comes in today with chief complaint of Medical Management of Chronic Issues (No concerns, fasting, wants zofran written for once a day not as need and does not like trulicity. [facility] clinic does trulicity )   Diagnosis and orders addressed:  1. GAD (generalized anxiety disorder) - busPIRone (BUSPAR) 5 MG tablet; Take 1 tablet (5 mg total) by mouth 3 (three) times daily.  Dispense: 270 tablet; Refill: 3 - escitalopram (LEXAPRO) 20 MG tablet; Take 1 tablet (20 mg total) by mouth daily. (Needs to be seen before next refill)  Dispense: 90 tablet; Refill: 3  2. Depression, recurrent ([place]) - escitalopram (LEXAPRO) 20 MG tablet; Take 1 tablet (20 mg total) by mouth daily. (Needs to be seen before next refill)  Dispense: 90 tablet; Refill: 3  3. Gastroesophageal reflux disease without esophagitis - omeprazole (PRILOSEC) 40 MG capsule; Take 1 capsule (40 mg total) by mouth daily.  Dispense: 90 capsule; Refill: 2  4. Migraine with aura and without status migrainosus, not intractable - topiramate (TOPAMAX) 50 MG tablet; Take 1 tablet (50 mg total) by mouth 2 (two) times daily.  Dispense: 180 tablet; Refill: 2  5. Other depression, with emotional eating - buPROPion (WELLBUTRIN SR) 150 MG 12 hr tablet; Take 1 tablet (150 mg total) by mouth daily.  Dispense: 90 tablet; Refill: 3  6. Irritable bowel syndrome with both constipation and diarrhea  7. Morbid obesity ([place])  8. Other hyperlipidemia  9. Vitamin D deficiency  10. Pain of upper abdomen - ondansetron (ZOFRAN) 4 MG tablet; Take 1 tablet (4 mg total) by mouth every 8 (eight) hours as needed for nausea or vomiting. TAKE 1 TABLET BY MOUTH EVERY 8 HOURS AS NEEDED FOR NAUSEA AND VOMITING  Dispense: 90 tablet; Refill: 2  11. Nausea and vomiting, intractability of vomiting not specified, unspecified vomiting type - ondansetron (ZOFRAN) 4 MG tablet; Take 1 tablet (4 mg total) by mouth every 8 (eight) hours as needed for nausea or  vomiting. TAKE 1 TABLET BY MOUTH EVERY 8 HOURS AS NEEDED FOR NAUSEA AND VOMITING  Dispense: 90 tablet; Refill: 2   Labs pending Health Maintenance reviewed Diet and exercise encouraged  Follow up plan: 6 months    Evelina Dun, FNP

## 2020-08-31 ENCOUNTER — Other Ambulatory Visit (INDEPENDENT_AMBULATORY_CARE_PROVIDER_SITE_OTHER): Payer: Self-pay | Admitting: Physician Assistant

## 2020-08-31 DIAGNOSIS — R632 Polyphagia: Secondary | ICD-10-CM

## 2020-08-31 NOTE — Telephone Encounter (Signed)
Pt last seen by Tracey Aguilar, PA-C.  

## 2020-09-01 ENCOUNTER — Ambulatory Visit (INDEPENDENT_AMBULATORY_CARE_PROVIDER_SITE_OTHER): Payer: 59 | Admitting: Physician Assistant

## 2020-09-01 ENCOUNTER — Encounter (INDEPENDENT_AMBULATORY_CARE_PROVIDER_SITE_OTHER): Payer: Self-pay | Admitting: Physician Assistant

## 2020-09-01 ENCOUNTER — Other Ambulatory Visit: Payer: Self-pay

## 2020-09-01 ENCOUNTER — Encounter: Payer: Self-pay | Admitting: Family Medicine

## 2020-09-01 VITALS — BP 107/74 | HR 71 | Temp 97.8°F | Ht 63.0 in | Wt 210.0 lb

## 2020-09-01 DIAGNOSIS — Z9189 Other specified personal risk factors, not elsewhere classified: Secondary | ICD-10-CM | POA: Diagnosis not present

## 2020-09-01 DIAGNOSIS — R632 Polyphagia: Secondary | ICD-10-CM

## 2020-09-01 DIAGNOSIS — E7849 Other hyperlipidemia: Secondary | ICD-10-CM

## 2020-09-01 DIAGNOSIS — E559 Vitamin D deficiency, unspecified: Secondary | ICD-10-CM

## 2020-09-01 DIAGNOSIS — Z6837 Body mass index (BMI) 37.0-37.9, adult: Secondary | ICD-10-CM

## 2020-09-01 MED ORDER — VITAMIN D (ERGOCALCIFEROL) 1.25 MG (50000 UNIT) PO CAPS: 50000.0000 [IU] | ORAL_CAPSULE | ORAL | 0 refills | Status: DC

## 2020-09-01 MED ORDER — OZEMPIC (2 MG/DOSE) 8 MG/3ML ~~LOC~~ SOPN: 2.0000 mg | PEN_INJECTOR | SUBCUTANEOUS | 0 refills | Status: DC

## 2020-09-02 LAB — LIPID PANEL
Chol/HDL Ratio: 5 ratio — ABNORMAL HIGH (ref 0.0–4.4)
Cholesterol, Total: 199 mg/dL (ref 100–199)
HDL: 40 mg/dL (ref >39)
LDL Chol Calc (NIH): 142 mg/dL — ABNORMAL HIGH (ref 0–99)
Triglycerides: 96 mg/dL (ref 0–149)
VLDL Cholesterol Cal: 17 mg/dL (ref 5–40)

## 2020-09-02 LAB — COMPREHENSIVE METABOLIC PANEL
ALT: 11 IU/L (ref 0–32)
AST: 11 IU/L (ref 0–40)
Albumin/Globulin Ratio: 1.8 (ref 1.2–2.2)
Albumin: 4.4 g/dL (ref 3.8–4.8)
Alkaline Phosphatase: 114 IU/L (ref 44–121)
BUN/Creatinine Ratio: 14 (ref 9–23)
BUN: 11 mg/dL (ref 6–20)
Bilirubin Total: 0.4 mg/dL (ref 0.0–1.2)
CO2: 22 mmol/L (ref 20–29)
Calcium: 9.2 mg/dL (ref 8.7–10.2)
Chloride: 107 mmol/L — ABNORMAL HIGH (ref 96–106)
Creatinine, Ser: 0.76 mg/dL (ref 0.57–1.00)
Globulin, Total: 2.4 g/dL (ref 1.5–4.5)
Glucose: 83 mg/dL (ref 65–99)
Potassium: 4.6 mmol/L (ref 3.5–5.2)
Sodium: 143 mmol/L (ref 134–144)
Total Protein: 6.8 g/dL (ref 6.0–8.5)
eGFR: 107 mL/min/{1.73_m2} (ref >59)

## 2020-09-02 LAB — VITAMIN D 25 HYDROXY (VIT D DEFICIENCY, FRACTURES): Vit D, 25-Hydroxy: 69.1 ng/mL (ref 30.0–100.0)

## 2020-09-02 NOTE — Progress Notes (Signed)
Chief Complaint:   OBESITY Brittney Farmer is here to discuss her progress with her obesity treatment plan along with follow-up of her obesity related diagnoses. Brittney Farmer is on the Category 2 Plan and states she is following her eating plan approximately 75% of the time. Brittney Farmer states she is walking for 20 minutes 3-4 times per week.  Today's visit was #: 10 Starting weight: 235 lbs Starting date: 02/20/2020 Today's weight: 210 lbs Today's date: 09/01/2020 Total lbs lost to date: 25 Total lbs lost since last in-office visit: 2  Interim History: Brittney Farmer took Trulicity for 3 weeks and she could not tolerate it. She did well on Ozempic without side effects and she felt like it helped control her appetite.  Subjective:   1. Polyphagia Kamia reports Trulicity gave her terrible reflux and made her want to eat more food.  2. Other hyperlipidemia Brittney Farmer is not on medications, and she is walking 3-4 times per week.  3. Vitamin D deficiency Brittney Farmer is on Vit D weekly, and she is tolerating it well.  4. At risk for side effect of medication Brittney Farmer is at risk of medications side effects due to increased dose of Ozempic.  Assessment/Plan:   1. Polyphagia Intensive lifestyle modifications are the first line treatment for this issue. We discussed several lifestyle modifications today. Brittney Farmer has Ozempic 1 mg at home, failed with Trulicity, and insurance did not cover Saxenda. She will take 1 mg Ozempic for 1 week and then increase to 2 mg with no refills. She will continue to work on diet, exercise and weight loss efforts. We will check labs today. Orders and follow up as documented in patient record.  Counseling . Polyphagia is excessive hunger. . Causes can include: low blood sugars, hypERthyroidism, PMS, lack of sleep, stress, insulin resistance, diabetes, certain medications, and diets that are deficient in protein and fiber.   - Comprehensive metabolic panel - Semaglutide, 2 MG/DOSE,  (OZEMPIC, 2 MG/DOSE,) 8 MG/3ML SOPN; Inject 2 mg into the skin once a week.  Dispense: 3 mL; Refill: 0  2. Other hyperlipidemia Cardiovascular risk and specific lipid/LDL goals reviewed. We discussed several lifestyle modifications today. We will check labs today. Brittney Farmer will continue to work on diet, exercise and weight loss efforts. Orders and follow up as documented in patient record.   Counseling Intensive lifestyle modifications are the first line treatment for this issue. . Dietary changes: Increase soluble fiber. Decrease simple carbohydrates. . Exercise changes: Moderate to vigorous-intensity aerobic activity 150 minutes per week if tolerated. . Lipid-lowering medications: see documented in medical record.  - Lipid panel  3. Vitamin D deficiency Low Vitamin D level contributes to fatigue and are associated with obesity, breast, and colon cancer. We will check labs today, and we will refill prescription Vitamin D for 1 month. Brittney Farmer will follow-up for routine testing of Vitamin D, at least 2-3 times per year to avoid over-replacement.  - VITAMIN D 25 Hydroxy (Vit-D Deficiency, Fractures) - Vitamin D, Ergocalciferol, (DRISDOL) 1.25 MG (50000 UNIT) CAPS capsule; Take 1 capsule (50,000 Units total) by mouth every 7 (seven) days.  Dispense: 4 capsule; Refill: 0  4. At risk for side effect of medication Brittney Farmer was given approximately 15 minutes of drug side effect counseling today.  We discussed side effect possibility and risk versus benefits. Brittney Farmer agreed to the medication and will contact this office if these side effects are intolerable.  Repetitive spaced learning was employed today to elicit superior memory formation and behavioral change.  5. Class 2 severe obesity with serious comorbidity and body mass index (BMI) of 37.0 to 37.9 in adult, unspecified obesity type (HCC) Brittney Farmer is currently in the action stage of change. As such, her goal is to continue with weight loss  efforts. She has agreed to the Category 2 Plan.   Exercise goals: As is.  Behavioral modification strategies: meal planning and cooking strategies and keeping healthy foods in the home.  Brittney Farmer has agreed to follow-up with our clinic in 5 weeks. She was informed of the importance of frequent follow-up visits to maximize her success with intensive lifestyle modifications for her multiple health conditions.   Brittney Farmer was informed we would discuss her lab results at her next visit unless there is a critical issue that needs to be addressed sooner. Brittney Farmer agreed to keep her next visit at the agreed upon time to discuss these results.  Objective:   Blood pressure 107/74, pulse 71, temperature 97.8 F (36.6 C), height 5\' 3"  (1.6 m), weight 210 lb (95.3 kg), SpO2 99 %. Body mass index is 37.2 kg/m.  General: Cooperative, alert, well developed, in no acute distress. HEENT: Conjunctivae and lids unremarkable. Cardiovascular: Regular rhythm.  Lungs: Normal work of breathing. Neurologic: No focal deficits.   Lab Results  Component Value Date   CREATININE 0.76 09/01/2020   BUN 11 09/01/2020   NA 143 09/01/2020   K 4.6 09/01/2020   CL 107 (H) 09/01/2020   CO2 22 09/01/2020   Lab Results  Component Value Date   ALT 11 09/01/2020   AST 11 09/01/2020   ALKPHOS 114 09/01/2020   BILITOT 0.4 09/01/2020   Lab Results  Component Value Date   HGBA1C 5.1 02/20/2020   Lab Results  Component Value Date   INSULIN 5.2 02/20/2020   Lab Results  Component Value Date   TSH 1.010 02/20/2020   Lab Results  Component Value Date   CHOL 199 09/01/2020   HDL 40 09/01/2020   LDLCALC 142 (H) 09/01/2020   TRIG 96 09/01/2020   CHOLHDL 5.0 (H) 09/01/2020   Lab Results  Component Value Date   WBC 7.5 07/01/2020   HGB 14.1 07/01/2020   HCT 43.2 07/01/2020   MCV 87 07/01/2020   PLT 353 07/01/2020   Lab Results  Component Value Date   IRON 38 02/20/2020   TIBC 284 02/20/2020   FERRITIN  103 02/20/2020   Attestation Statements:   Reviewed by clinician on day of visit: allergies, medications, problem list, medical history, surgical history, family history, social history, and previous encounter notes.   Wilhemena Durie, am acting as transcriptionist for [employer], PA-C.  I have reviewed the above documentation for accuracy and completeness, and I agree with the above. Abby Potash, PA-C

## 2020-10-06 ENCOUNTER — Ambulatory Visit (INDEPENDENT_AMBULATORY_CARE_PROVIDER_SITE_OTHER): Payer: 59 | Admitting: Physician Assistant

## 2020-10-06 ENCOUNTER — Other Ambulatory Visit: Payer: Self-pay

## 2020-10-06 VITALS — BP 107/69 | HR 66 | Temp 98.0°F | Ht 63.0 in | Wt 205.0 lb

## 2020-10-06 DIAGNOSIS — Z6837 Body mass index (BMI) 37.0-37.9, adult: Secondary | ICD-10-CM

## 2020-10-06 DIAGNOSIS — E559 Vitamin D deficiency, unspecified: Secondary | ICD-10-CM

## 2020-10-06 DIAGNOSIS — R632 Polyphagia: Secondary | ICD-10-CM | POA: Diagnosis not present

## 2020-10-06 DIAGNOSIS — Z9189 Other specified personal risk factors, not elsewhere classified: Secondary | ICD-10-CM | POA: Diagnosis not present

## 2020-10-06 MED ORDER — OZEMPIC (2 MG/DOSE) 8 MG/3ML ~~LOC~~ SOPN: 2.0000 mg | PEN_INJECTOR | SUBCUTANEOUS | 0 refills | Status: DC

## 2020-10-14 NOTE — Progress Notes (Signed)
Chief Complaint:   OBESITY Brittney Farmer is here to discuss her progress with her obesity treatment plan along with follow-up of her obesity related diagnoses. Brittney Farmer is on the Category 2 Plan and states she is following her eating plan approximately 50% of the time. Brittney Farmer states she is walking for 20-30 minutes 7 times per week.  Today's visit was #: 11 Starting weight: 235 lbs Starting date: 02/20/2020 Today's weight: 205 lbs Today's date: 10/06/2020 Total lbs lost to date: 30 Total lbs lost since last in-office visit: 5  Interim History: Brittney Farmer did well with weight loss. She is on Ozempic and she notes it is helping her portion control. She is unable to get in all of the protein on her plan.  Subjective:   1. Polyphagia Brittney Farmer denies excessive hunger with Ozempic 2 mg.  2. Vitamin D deficiency Brittney Farmer's last Vit D level was at goal. I discussed labs with the patient today.  3. At risk for impaired metabolic function Brittney Farmer is at increased risk for impaired metabolic function due to not eating all of her food.  Assessment/Plan:   1. Polyphagia Intensive lifestyle modifications are the first line treatment for this issue. We discussed several lifestyle modifications today. We will refill Ozempic for 1 month. Brittney Farmer will continue to work on diet, exercise and weight loss efforts. Orders and follow up as documented in patient record.  Counseling Polyphagia is excessive hunger. Causes can include: low blood sugars, hypERthyroidism, PMS, lack of sleep, stress, insulin resistance, diabetes, certain medications, and diets that are deficient in protein and fiber.   - Semaglutide, 2 MG/DOSE, (OZEMPIC, 2 MG/DOSE,) 8 MG/3ML SOPN; Inject 2 mg into the skin once a week.  Dispense: 3 mL; Refill: 0  2. Vitamin D deficiency Low Vitamin D level contributes to fatigue and are associated with obesity, breast, and colon cancer. Brittney Farmer agreed to change to OTC Vitamin D 4,000 IU daily and  will follow-up for routine testing of Vitamin D, at least 2-3 times per year to avoid over-replacement.  3. At risk for impaired metabolic function Brittney Farmer was given approximately 15 minutes of impaired  metabolic function prevention counseling today. We discussed intensive lifestyle modifications today with an emphasis on specific nutrition and exercise instructions and strategies.   Repetitive spaced learning was employed today to elicit superior memory formation and behavioral change.  4. Class 2 severe obesity with serious comorbidity and body mass index (BMI) of 37.0 to 37.9 in adult, unspecified obesity type (HCC) Brittney Farmer is currently in the action stage of change. As such, her goal is to continue with weight loss efforts. She has agreed to the Category 2 Plan.   Exercise goals: As is.  Behavioral modification strategies: meal planning and cooking strategies and keeping healthy foods in the home.  Brittney Farmer has agreed to follow-up with our clinic in 3 weeks. She was informed of the importance of frequent follow-up visits to maximize her success with intensive lifestyle modifications for her multiple health conditions.   Objective:   Blood pressure 107/69, pulse 66, temperature 98 F (36.7 C), height 5\' 3"  (1.6 m), weight 205 lb (93 kg), SpO2 100 %. Body mass index is 36.31 kg/m.  General: Cooperative, alert, well developed, in no acute distress. HEENT: Conjunctivae and lids unremarkable. Cardiovascular: Regular rhythm.  Lungs: Normal work of breathing. Neurologic: No focal deficits.   Lab Results  Component Value Date   CREATININE 0.76 09/01/2020   BUN 11 09/01/2020   NA 143 09/01/2020  K 4.6 09/01/2020   CL 107 (H) 09/01/2020   CO2 22 09/01/2020   Lab Results  Component Value Date   ALT 11 09/01/2020   AST 11 09/01/2020   ALKPHOS 114 09/01/2020   BILITOT 0.4 09/01/2020   Lab Results  Component Value Date   HGBA1C 5.1 02/20/2020   Lab Results  Component Value  Date   INSULIN 5.2 02/20/2020   Lab Results  Component Value Date   TSH 1.010 02/20/2020   Lab Results  Component Value Date   CHOL 199 09/01/2020   HDL 40 09/01/2020   LDLCALC 142 (H) 09/01/2020   TRIG 96 09/01/2020   CHOLHDL 5.0 (H) 09/01/2020   Lab Results  Component Value Date   WBC 7.5 07/01/2020   HGB 14.1 07/01/2020   HCT 43.2 07/01/2020   MCV 87 07/01/2020   PLT 353 07/01/2020   Lab Results  Component Value Date   IRON 38 02/20/2020   TIBC 284 02/20/2020   FERRITIN 103 02/20/2020   Attestation Statements:   Reviewed by clinician on day of visit: allergies, medications, problem list, medical history, surgical history, family history, social history, and previous encounter notes.   Wilhemena Durie, am acting as transcriptionist for [employer], PA-C.  I have reviewed the above documentation for accuracy and completeness, and I agree with the above. Abby Potash, PA-C

## 2020-10-27 ENCOUNTER — Telehealth: Payer: Self-pay

## 2020-10-27 ENCOUNTER — Ambulatory Visit: Payer: Self-pay | Admitting: Gastroenterology

## 2020-10-27 ENCOUNTER — Other Ambulatory Visit: Payer: Self-pay

## 2020-10-27 NOTE — Telephone Encounter (Signed)
Called pt, to start virtual visit. She was getting ready to go into doctors office for her kids appt at 11:00am and 11:30am. Requested to reschedule today's appt.

## 2020-11-04 ENCOUNTER — Encounter (INDEPENDENT_AMBULATORY_CARE_PROVIDER_SITE_OTHER): Payer: Self-pay | Admitting: Physician Assistant

## 2020-11-04 ENCOUNTER — Other Ambulatory Visit: Payer: Self-pay

## 2020-11-04 ENCOUNTER — Ambulatory Visit (INDEPENDENT_AMBULATORY_CARE_PROVIDER_SITE_OTHER): Payer: 59 | Admitting: Physician Assistant

## 2020-11-04 VITALS — BP 110/73 | HR 78 | Temp 97.9°F | Ht 63.0 in | Wt 204.0 lb

## 2020-11-04 DIAGNOSIS — R632 Polyphagia: Secondary | ICD-10-CM

## 2020-11-04 DIAGNOSIS — Z9189 Other specified personal risk factors, not elsewhere classified: Secondary | ICD-10-CM | POA: Diagnosis not present

## 2020-11-04 DIAGNOSIS — E559 Vitamin D deficiency, unspecified: Secondary | ICD-10-CM | POA: Diagnosis not present

## 2020-11-04 DIAGNOSIS — Z6837 Body mass index (BMI) 37.0-37.9, adult: Secondary | ICD-10-CM

## 2020-11-04 MED ORDER — VITAMIN D3 50 MCG (2000 UT) PO CAPS: ORAL_CAPSULE | ORAL | 0 refills | Status: DC

## 2020-11-04 MED ORDER — OZEMPIC (2 MG/DOSE) 8 MG/3ML ~~LOC~~ SOPN: 2.0000 mg | PEN_INJECTOR | SUBCUTANEOUS | 0 refills | Status: DC

## 2020-11-10 NOTE — Progress Notes (Signed)
Chief Complaint:   OBESITY Brittney Farmer is here to discuss her progress with her obesity treatment plan along with follow-up of her obesity related diagnoses. Brittney Farmer is on the Category 2 Plan and states she is following her eating plan approximately 50-75% of the time. Brittney Farmer states she is walking for 20-30 minutes 3-4 times per week.  Today's visit was #: 12 Starting weight: 235 lbs Starting date: 02/20/2020 Today's weight: 204 lbs Today's date: 11/04/2020 Total lbs lost to date: 31 Total lbs lost since last in-office visit: 1  Interim History: Brittney Farmer just returned from vacation at the beach. Even though she didn't make the best choices, she managed to portion control. She is ready to get back on the plan.  Subjective:   1. Polyphagia Clancy is on Ozempic 2 mg helper to portion control and decrease appetite.  2. Vitamin D deficiency Brittney Farmer is on OTC Vit D.  3. At risk for osteoporosis Brittney Farmer is at higher risk of osteopenia and osteoporosis due to Vitamin D deficiency.   Assessment/Plan:   1. Polyphagia Intensive lifestyle modifications are the first line treatment for this issue. We discussed several lifestyle modifications today. We will refill Ozempic 2 mg for 1 month. Brittney Farmer will continue to work on diet, exercise and weight loss efforts. Orders and follow up as documented in patient record.  Counseling Polyphagia is excessive hunger. Causes can include: low blood sugars, hypERthyroidism, PMS, lack of sleep, stress, insulin resistance, diabetes, certain medications, and diets that are deficient in protein and fiber.   - Semaglutide, 2 MG/DOSE, (OZEMPIC, 2 MG/DOSE,) 8 MG/3ML SOPN; Inject 2 mg into the skin once a week.  Dispense: 3 mL; Refill: 0  2. Vitamin D deficiency Low Vitamin D level contributes to fatigue and are associated with obesity, breast, and colon cancer. We will refill Vitamin D for 1 month. Brittney Farmer will follow-up for routine testing of Vitamin D, at  least 2-3 times per year to avoid over-replacement.  - Cholecalciferol (VITAMIN D3) 50 MCG (2000 UT) capsule; Take 2 caps daily to total 4,000 units daily  Dispense: 30 capsule; Refill: 0  3. At risk for osteoporosis Brittney Farmer was given approximately 15 minutes of osteoporosis prevention counseling today. Brittney Farmer is at risk for osteopenia and osteoporosis due to her Vitamin D deficiency. She was encouraged to take her Vitamin D and follow her higher calcium diet and increase strengthening exercise to help strengthen her bones and decrease her risk of osteopenia and osteoporosis.  Repetitive spaced learning was employed today to elicit superior memory formation and behavioral change.  4. Class 2 severe obesity with serious comorbidity and body mass index (BMI) of 37.0 to 37.9 in adult, unspecified obesity type (HCC) Brittney Farmer is currently in the action stage of change. As such, her goal is to continue with weight loss efforts. She has agreed to the Category 2 Plan.   Exercise goals: As is.  Behavioral modification strategies: increasing lean protein intake and keeping healthy foods in the home.  Brittney Farmer has agreed to follow-up with our clinic in 4 weeks. She was informed of the importance of frequent follow-up visits to maximize her success with intensive lifestyle modifications for her multiple health conditions.   Objective:   Blood pressure 110/73, pulse 78, temperature 97.9 F (36.6 C), height 5\' 3"  (1.6 m), weight 204 lb (92.5 kg), last menstrual period 10/05/2020, SpO2 100 %. Body mass index is 36.14 kg/m.  General: Cooperative, alert, well developed, in no acute distress. HEENT: Conjunctivae and lids  unremarkable. Cardiovascular: Regular rhythm.  Lungs: Normal work of breathing. Neurologic: No focal deficits.   Lab Results  Component Value Date   CREATININE 0.76 09/01/2020   BUN 11 09/01/2020   NA 143 09/01/2020   K 4.6 09/01/2020   CL 107 (H) 09/01/2020   CO2 22 09/01/2020    Lab Results  Component Value Date   ALT 11 09/01/2020   AST 11 09/01/2020   ALKPHOS 114 09/01/2020   BILITOT 0.4 09/01/2020   Lab Results  Component Value Date   HGBA1C 5.1 02/20/2020   Lab Results  Component Value Date   INSULIN 5.2 02/20/2020   Lab Results  Component Value Date   TSH 1.010 02/20/2020   Lab Results  Component Value Date   CHOL 199 09/01/2020   HDL 40 09/01/2020   LDLCALC 142 (H) 09/01/2020   TRIG 96 09/01/2020   CHOLHDL 5.0 (H) 09/01/2020   Lab Results  Component Value Date   VD25OH 69.1 09/01/2020   VD25OH 30.0 02/20/2020   VD25OH 33.9 08/04/2014   Lab Results  Component Value Date   WBC 7.5 07/01/2020   HGB 14.1 07/01/2020   HCT 43.2 07/01/2020   MCV 87 07/01/2020   PLT 353 07/01/2020   Lab Results  Component Value Date   IRON 38 02/20/2020   TIBC 284 02/20/2020   FERRITIN 103 02/20/2020   Attestation Statements:   Reviewed by clinician on day of visit: allergies, medications, problem list, medical history, surgical history, family history, social history, and previous encounter notes.   Wilhemena Durie, am acting as transcriptionist for [employer], PA-C.  I have reviewed the above documentation for accuracy and completeness, and I agree with the above. Abby Potash, PA-C

## 2020-12-02 ENCOUNTER — Ambulatory Visit (INDEPENDENT_AMBULATORY_CARE_PROVIDER_SITE_OTHER): Payer: 59 | Admitting: Physician Assistant

## 2020-12-03 ENCOUNTER — Ambulatory Visit (INDEPENDENT_AMBULATORY_CARE_PROVIDER_SITE_OTHER): Payer: 59 | Admitting: Physician Assistant

## 2020-12-07 ENCOUNTER — Ambulatory Visit (INDEPENDENT_AMBULATORY_CARE_PROVIDER_SITE_OTHER): Payer: 59 | Admitting: Family Medicine

## 2020-12-07 ENCOUNTER — Encounter (INDEPENDENT_AMBULATORY_CARE_PROVIDER_SITE_OTHER): Payer: Self-pay | Admitting: Family Medicine

## 2020-12-07 ENCOUNTER — Other Ambulatory Visit: Payer: Self-pay

## 2020-12-07 VITALS — BP 103/68 | HR 72 | Temp 98.0°F | Ht 63.0 in | Wt 201.0 lb

## 2020-12-07 DIAGNOSIS — R632 Polyphagia: Secondary | ICD-10-CM | POA: Diagnosis not present

## 2020-12-07 DIAGNOSIS — Z9189 Other specified personal risk factors, not elsewhere classified: Secondary | ICD-10-CM

## 2020-12-07 DIAGNOSIS — Z6841 Body Mass Index (BMI) 40.0 and over, adult: Secondary | ICD-10-CM | POA: Diagnosis not present

## 2020-12-07 DIAGNOSIS — E559 Vitamin D deficiency, unspecified: Secondary | ICD-10-CM

## 2020-12-07 MED ORDER — OZEMPIC (2 MG/DOSE) 8 MG/3ML ~~LOC~~ SOPN: 2.0000 mg | PEN_INJECTOR | SUBCUTANEOUS | 0 refills | Status: DC

## 2020-12-08 NOTE — Progress Notes (Signed)
Chief Complaint:   OBESITY Brittney Farmer is here to discuss her progress with her obesity treatment plan along with follow-up of her obesity related diagnoses. Brittney Farmer is on the Category 2 Plan and states she is following her eating plan approximately 75% of the time. Brittney Farmer states she is walking 20 minutes 5 times per week.  Today's visit was #: 13 Starting weight: 235 lbs Starting date: 02/20/2020 Today's weight: 201 lbs Today's date: 12/07/2020 Total lbs lost to date: 34 Total lbs lost since last in-office visit: 3  Interim History: Brittney Farmer is here for a follow up office visit.  We reviewed her meal plan and questions were answered.  Patient's food recall appears to be accurate and consistent with what is on plan when she is following it.   When eating on plan, her hunger and cravings are well controlled.   Brittney Farmer is skipping food/meals occasionally.  Subjective:   1. Polyphagia Current treatment: Ozempic  2. Vitamin D deficiency She is currently taking OTC vitamin D 2,000 IU each day. She denies nausea, vomiting or muscle weakness.  Lab Results  Component Value Date   VD25OH 69.1 09/01/2020   VD25OH 30.0 02/20/2020   VD25OH 33.9 08/04/2014   Assessment/Plan:   1. Polyphagia She will continue to focus on protein-rich, low simple carbohydrate foods. We reviewed the importance of hydration, regular exercise for stress reduction, and restorative sleep.  Refill- Semaglutide, 2 MG/DOSE, (OZEMPIC, 2 MG/DOSE,) 8 MG/3ML SOPN; Inject 2 mg into the skin once a week.  Dispense: 3 mL; Refill: 0  2. Vitamin D deficiency Low Vitamin D level contributes to fatigue and are associated with obesity, breast, and colon cancer. She agrees to continue to take OTC Vitamin D 2,000 IU daily and will follow-up for routine testing of Vitamin D, at least 2-3 times per year to avoid over-replacement.  3. At risk for deficient intake of food Brittney Farmer was given extensive education and counseling  today of more than 9 minutes on risks associated with deficient food intake.  Counseled her on the importance of following our prescribed meal plan and eating adequate amounts of protein.  Discussed with Brittney Farmer that inadequate food intake over longer periods of time can slow their metabolism down significantly.   4. Obesity with current BMI 35.7  Brittney Farmer is currently in the action stage of change. As such, her goal is to continue with weight loss efforts. She has agreed to the Category 2 Plan.   Exercise goals: For substantial health benefits, adults should do at least 150 minutes (2 hours and 30 minutes) a week of moderate-intensity, or 75 minutes (1 hour and 15 minutes) a week of vigorous-intensity aerobic physical activity, or an equivalent combination of moderate- and vigorous-intensity aerobic activity. Aerobic activity should be performed in episodes of at least 10 minutes, and preferably, it should be spread throughout the week.  Behavioral modification strategies: no skipping meals.  Brittney Farmer has agreed to follow-up with our clinic in 3-4 weeks. She was informed of the importance of frequent follow-up visits to maximize her success with intensive lifestyle modifications for her multiple health conditions.   Objective:   Blood pressure 103/68, pulse 72, temperature 98 F (36.7 C), height '5\' 3"'$  (1.6 m), weight 201 lb (91.2 kg), last menstrual period 10/30/2020, SpO2 97 %. Body mass index is 35.61 kg/m.  General: Cooperative, alert, well developed, in no acute distress. HEENT: Conjunctivae and lids unremarkable. Cardiovascular: Regular rhythm.  Lungs: Normal work of breathing. Neurologic: No  focal deficits.   Lab Results  Component Value Date   CREATININE 0.76 09/01/2020   BUN 11 09/01/2020   NA 143 09/01/2020   K 4.6 09/01/2020   CL 107 (H) 09/01/2020   CO2 22 09/01/2020   Lab Results  Component Value Date   ALT 11 09/01/2020   AST 11 09/01/2020   ALKPHOS 114  09/01/2020   BILITOT 0.4 09/01/2020   Lab Results  Component Value Date   HGBA1C 5.1 02/20/2020   Lab Results  Component Value Date   INSULIN 5.2 02/20/2020   Lab Results  Component Value Date   TSH 1.010 02/20/2020   Lab Results  Component Value Date   CHOL 199 09/01/2020   HDL 40 09/01/2020   LDLCALC 142 (H) 09/01/2020   TRIG 96 09/01/2020   CHOLHDL 5.0 (H) 09/01/2020   Lab Results  Component Value Date   VD25OH 69.1 09/01/2020   VD25OH 30.0 02/20/2020   VD25OH 33.9 08/04/2014   Lab Results  Component Value Date   WBC 7.5 07/01/2020   HGB 14.1 07/01/2020   HCT 43.2 07/01/2020   MCV 87 07/01/2020   PLT 353 07/01/2020   Lab Results  Component Value Date   IRON 38 02/20/2020   TIBC 284 02/20/2020   FERRITIN 103 02/20/2020   Attestation Statements:   Reviewed by clinician on day of visit: allergies, medications, problem list, medical history, surgical history, family history, social history, and previous encounter notes.  Coral Ceo, CMA, am acting as transcriptionist for [employer], DO.  I have reviewed the above documentation for accuracy and completeness, and I agree with the above. Marjory Sneddon, D.O.  The [place] was signed into law in 2016 which includes the topic of electronic health records.  This provides immediate access to information in MyChart.  This includes consultation notes, operative notes, office notes, lab results and pathology reports.  If you have any questions about what you read please let us know at your next visit so we can discuss your concerns and take corrective action if need be.  We are right here with you.

## 2020-12-31 ENCOUNTER — Encounter: Payer: Self-pay | Admitting: Internal Medicine

## 2021-01-11 ENCOUNTER — Ambulatory Visit (INDEPENDENT_AMBULATORY_CARE_PROVIDER_SITE_OTHER): Payer: 59 | Admitting: Family Medicine

## 2021-01-11 ENCOUNTER — Encounter (INDEPENDENT_AMBULATORY_CARE_PROVIDER_SITE_OTHER): Payer: Self-pay | Admitting: Family Medicine

## 2021-01-11 ENCOUNTER — Other Ambulatory Visit: Payer: Self-pay

## 2021-01-11 VITALS — BP 116/73 | HR 77 | Temp 97.5°F | Ht 63.0 in | Wt 205.0 lb

## 2021-01-11 DIAGNOSIS — Z6841 Body Mass Index (BMI) 40.0 and over, adult: Secondary | ICD-10-CM

## 2021-01-11 DIAGNOSIS — F430 Acute stress reaction: Secondary | ICD-10-CM | POA: Diagnosis not present

## 2021-01-11 DIAGNOSIS — Z9189 Other specified personal risk factors, not elsewhere classified: Secondary | ICD-10-CM

## 2021-01-11 DIAGNOSIS — R632 Polyphagia: Secondary | ICD-10-CM

## 2021-01-11 MED ORDER — OZEMPIC (2 MG/DOSE) 8 MG/3ML ~~LOC~~ SOPN
2.0000 mg | PEN_INJECTOR | SUBCUTANEOUS | 0 refills | Status: DC
Start: 2021-01-11 — End: 2021-02-04

## 2021-01-11 NOTE — Progress Notes (Signed)
Chief Complaint:   OBESITY Brittney Farmer is here to discuss her progress with her obesity treatment plan along with follow-up of her obesity related diagnoses. Brittney Farmer is on the Category 2 Plan and states she is following her eating plan approximately 50% of the time. Brittney Farmer states she is walking 20-30 minutes 2-3 times per week.  Today's visit was #: 14 Starting weight: 235 lbs Starting date: 02/20/2020 Today's weight: 205 lbs Today's date: 01/11/2021 Total lbs lost to date: 30 Total lbs lost since last in-office visit: +4  Interim History: Brittney Farmer's goal for last OV was to not skip meals/foods. She bought a [place] with her husband 3 weeks ago. She has been under a lot of stress and didn't eat all day for several days. When she did eat, it wasn't the best options.  Subjective:   1. Polyphagia Faith endorses excessive hunger.   2. Acute stress reaction Pt has increased stress with buying business. She has not meal prepped and feels very overwhelmed.  3. At risk for anxiety Brittney Farmer is at risk of developing anxiety due to stress, personal and or family history or current situation.  Assessment/Plan:  No orders of the defined types were placed in this encounter.   Medications Discontinued During This Encounter  Medication Reason   Semaglutide, 2 MG/DOSE, (OZEMPIC, 2 MG/DOSE,) 8 MG/3ML SOPN Reorder     Meds ordered this encounter  Medications   Semaglutide, 2 MG/DOSE, (OZEMPIC, 2 MG/DOSE,) 8 MG/3ML SOPN    Sig: Inject 2 mg into the skin once a week.    Dispense:  3 mL    Refill:  0     1. Polyphagia Intensive lifestyle modifications are the first line treatment for this issue. We discussed several lifestyle modifications today and she will continue to work on diet, exercise and weight loss efforts. Orders and follow up as documented in patient record.  Counseling Polyphagia is excessive hunger. Causes can include: low blood sugars, hypERthyroidism, PMS,  lack of sleep, stress, insulin resistance, diabetes, certain medications, and diets that are deficient in protein and fiber.   Refill- Semaglutide, 2 MG/DOSE, (OZEMPIC, 2 MG/DOSE,) 8 MG/3ML SOPN; Inject 2 mg into the skin once a week.  Dispense: 3 mL; Refill: 0  2. Acute stress reaction Exercise 10 minutes QD. Time management strategies discussed with pt. Continue Buspar, Wellbutrin, and Lexapro.  3. At risk for anxiety Brittney Farmer was given approximately 9 minutes of anxiety risk counseling today. The patient has risk factors for this condition.  We discussed the importance of a healthy work/life balance, a healthy relationship with food, and a good support system.  We discussed various strategies to help cope with these emotions as well.  I recommended counseling, meditation / prayer, healthy eating habits, sleep hygiene, and exercising to help manage these feelings.   4. Obesity with current BMI of 36.4  Brittney Farmer is currently in the action stage of change. As such, her goal is to continue with weight loss efforts. She has agreed to the Category 2 Plan.   Exercise goals:  10 minutes of walking everyday.  Behavioral modification strategies: no skipping meals, meal planning and cooking strategies, and avoiding temptations.  Brittney Farmer has agreed to follow-up with our clinic in 3 weeks. She was informed of the importance of frequent follow-up visits to maximize her success with intensive lifestyle modifications for her multiple health conditions.   Objective:   Blood pressure 116/73, pulse 77, temperature (!) 97.5 F (36.4 C), height  $'5\' 3"'O$  (1.6 m), weight 205 lb (93 kg), SpO2 100 %. Body mass index is 36.31 kg/m.  General: Cooperative, alert, well developed, in no acute distress. HEENT: Conjunctivae and lids unremarkable. Cardiovascular: Regular rhythm.  Lungs: Normal work of breathing. Neurologic: No focal deficits.   Lab Results  Component Value Date   CREATININE 0.76 09/01/2020    BUN 11 09/01/2020   NA 143 09/01/2020   K 4.6 09/01/2020   CL 107 (H) 09/01/2020   CO2 22 09/01/2020   Lab Results  Component Value Date   ALT 11 09/01/2020   AST 11 09/01/2020   ALKPHOS 114 09/01/2020   BILITOT 0.4 09/01/2020   Lab Results  Component Value Date   HGBA1C 5.1 02/20/2020   Lab Results  Component Value Date   INSULIN 5.2 02/20/2020   Lab Results  Component Value Date   TSH 1.010 02/20/2020   Lab Results  Component Value Date   CHOL 199 09/01/2020   HDL 40 09/01/2020   LDLCALC 142 (H) 09/01/2020   TRIG 96 09/01/2020   CHOLHDL 5.0 (H) 09/01/2020   Lab Results  Component Value Date   VD25OH 69.1 09/01/2020   VD25OH 30.0 02/20/2020   VD25OH 33.9 08/04/2014   Lab Results  Component Value Date   WBC 7.5 07/01/2020   HGB 14.1 07/01/2020   HCT 43.2 07/01/2020   MCV 87 07/01/2020   PLT 353 07/01/2020   Lab Results  Component Value Date   IRON 38 02/20/2020   TIBC 284 02/20/2020   FERRITIN 103 02/20/2020   Attestation Statements:   Reviewed by clinician on day of visit: allergies, medications, problem list, medical history, surgical history, family history, social history, and previous encounter notes.  Coral Ceo, CMA, am acting as transcriptionist for [employer], DO.  I have reviewed the above documentation for accuracy and completeness, and I agree with the above. Marjory Sneddon, D.O.  The [place] was signed into law in 2016 which includes the topic of electronic health records.  This provides immediate access to information in MyChart.  This includes consultation notes, operative notes, office notes, lab results and pathology reports.  If you have any questions about what you read please let us know at your next visit so we can discuss your concerns and take corrective action if need be.  We are right here with you.

## 2021-01-20 ENCOUNTER — Encounter: Payer: Self-pay | Admitting: Family Medicine

## 2021-01-20 ENCOUNTER — Ambulatory Visit (INDEPENDENT_AMBULATORY_CARE_PROVIDER_SITE_OTHER): Payer: 59 | Admitting: Family Medicine

## 2021-01-20 DIAGNOSIS — U071 COVID-19: Secondary | ICD-10-CM

## 2021-01-20 MED ORDER — MOLNUPIRAVIR EUA 200MG CAPSULE: 4.0000 | ORAL_CAPSULE | Freq: Two times a day (BID) | ORAL | 0 refills | Status: AC

## 2021-01-20 NOTE — Progress Notes (Signed)
Virtual Visit via telephone Note Due to COVID-19 pandemic this visit was conducted virtually. This visit type was conducted due to national recommendations for restrictions regarding the COVID-19 Pandemic (e.g. social distancing, sheltering in place) in an effort to limit this patient's exposure and mitigate transmission in our community. All issues noted in this document were discussed and addressed.  A physical exam was not performed with this format.   I connected with Brittney Farmer on 01/20/2021 at 1225 by telephone and verified that I am speaking with the correct person using two identifiers. Brittney Farmer is currently located at home and  no one  is currently with them during visit. The provider, Monia Pouch, FNP is located in their office at time of visit.  I discussed the limitations, risks, security and privacy concerns of performing an evaluation and management service by telephone and the availability of in person appointments. I also discussed with the patient that there may be a patient responsible charge related to this service. The patient expressed understanding and agreed to proceed.  Subjective:  Patient ID: Brittney Farmer, female    DOB: 10-19-1985, 35 y.o.   MRN: [id number]  Chief Complaint:  Covid Positive   HPI: Brittney Farmer is a 35 y.o. female presenting on 01/20/2021 for Covid Positive   Pt reports cough, congestion, fever, chills, headaches, and decreased appetite since Monday. Tested positive for COVID-19 yesterday. Taking Tylenol cold and sinus without relief of symptoms.     Relevant past medical, surgical, family, and social history reviewed and updated as indicated.  Allergies and medications reviewed and updated.   Past Medical History:  Diagnosis Date   ADD (attention deficit disorder)    ADHD    Anemia    Anxiety    Back pain    Constipation    Depression    GERD (gastroesophageal reflux disease)    IBS (irritable bowel syndrome)    IBS  (irritable bowel syndrome)    Palpitations    SOB (shortness of breath) on exertion    Spine curvature, acquired    Stomach ulcer     Past Surgical History:  Procedure Laterality Date   BIOPSY  07/27/2020   Procedure: BIOPSY;  Surgeon: Eloise Harman, DO;  [facility];  Service: Endoscopy;;   COLONOSCOPY WITH PROPOFOL N/A 07/27/2020   Procedure: COLONOSCOPY WITH PROPOFOL;  Surgeon: Eloise Harman, DO;  Location: [facility];  Service: Endoscopy;  Laterality: N/A;  PM   ESOPHAGOGASTRODUODENOSCOPY (EGD) WITH PROPOFOL N/A 07/27/2020   Procedure: ESOPHAGOGASTRODUODENOSCOPY (EGD) WITH PROPOFOL;  Surgeon: Eloise Harman, DO;  Location: [facility];  Service: Endoscopy;  Laterality: N/A;   HAND SURGERY Left 09/08/2016   [place]   TUBAL LIGATION  03/13/2019   VAGINAL DELIVERY  03/13/2019    Social History   Socioeconomic History   Marital status: Single    Spouse name: Truddie Crumble   Number of children: 4   Years of education: Not on file   Highest education level: Not on file  Occupational History   Occupation: Network engineer  Tobacco Use   Smoking status: Never   Smokeless tobacco: Never  Vaping Use   Vaping Use: Never used  Substance and Sexual Activity   Alcohol use: No   Drug use: No   Sexual activity: Yes  Other Topics Concern   Not on file  Social History Narrative   Not on file   Social Determinants of Health   Financial Resource Strain: Not on  file  Food Insecurity: Not on file  Transportation Needs: Not on file  Physical Activity: Not on file  Stress: Not on file  Social Connections: Not on file  Intimate Partner Violence: Not on file    Outpatient Encounter Medications as of 01/20/2021  Medication Sig   molnupiravir EUA (LAGEVRIO) 200 mg CAPS capsule Take 4 capsules (800 mg total) by mouth 2 (two) times daily for 5 days.   buPROPion (WELLBUTRIN SR) 150 MG 12 hr tablet Take 1 tablet (150 mg total) by mouth daily.   busPIRone (BUSPAR) 5 MG  tablet Take 1 tablet (5 mg total) by mouth 3 (three) times daily.   Cholecalciferol (VITAMIN D3) 50 MCG (2000 UT) capsule Take 2 caps daily to total 4,000 units daily   dicyclomine (BENTYL) 10 MG capsule Take 1 capsule (10 mg total) by mouth 4 (four) times daily -  before meals and at bedtime. Take one capsule up to four times daily for abdominal pain.   escitalopram (LEXAPRO) 20 MG tablet Take 1 tablet (20 mg total) by mouth daily. (Needs to be seen before next refill)   fluticasone (FLONASE) 50 MCG/ACT nasal spray Place 2 sprays into both nostrils daily.   loratadine (CLARITIN) 10 MG tablet Take 1 tablet (10 mg total) by mouth daily.   Multiple Vitamins-Minerals (WOMENS MULTIVITAMIN PO) Take 1 tablet by mouth daily.   omeprazole (PRILOSEC) 40 MG capsule Take 1 capsule (40 mg total) by mouth daily.   ondansetron (ZOFRAN) 4 MG tablet Take 1 tablet (4 mg total) by mouth every 8 (eight) hours as needed for nausea or vomiting. TAKE 1 TABLET BY MOUTH EVERY 8 HOURS AS NEEDED FOR NAUSEA AND VOMITING   rizatriptan (MAXALT) 10 MG tablet TAKE 1 TABLET BY MOUTH AS NEEDED FOR MIGRAINE. MAY REPEAT IN 2 HOURS IF NEEDED (Patient taking differently: Take 10 mg by mouth as needed for migraine. TAKE 1 TABLET BY MOUTH AS NEEDED FOR MIGRAINE. MAY REPEAT IN 2 HOURS IF NEEDED)   Semaglutide, 2 MG/DOSE, (OZEMPIC, 2 MG/DOSE,) 8 MG/3ML SOPN Inject 2 mg into the skin once a week.   topiramate (TOPAMAX) 50 MG tablet Take 1 tablet (50 mg total) by mouth 2 (two) times daily.   vitamin B-12 (CYANOCOBALAMIN) 1000 MCG tablet Take 1,000 mcg by mouth daily.   No facility-administered encounter medications on file as of 01/20/2021.    Allergies  Allergen Reactions   Latex     Band aids - causes skin irritation      Review of Systems  Constitutional:  Positive for activity change, appetite change, chills and fever. Negative for fatigue.  HENT:  Positive for congestion, postnasal drip, rhinorrhea and sore throat.   Eyes:  Negative.   Respiratory:  Positive for cough. Negative for chest tightness and shortness of breath.   Cardiovascular:  Negative for chest pain, palpitations and leg swelling.  Gastrointestinal:  Negative for abdominal pain, blood in stool, constipation, diarrhea, nausea and vomiting.  Endocrine: Negative.   Genitourinary:  Negative for decreased urine volume, difficulty urinating, dysuria, frequency and urgency.  Musculoskeletal:  Negative for arthralgias and myalgias.  Skin: Negative.   Allergic/Immunologic: Negative.   Neurological:  Positive for headaches. Negative for dizziness, tremors, seizures, syncope, facial asymmetry, speech difficulty, weakness, light-headedness and numbness.  Hematological: Negative.   Psychiatric/Behavioral:  Negative for confusion, hallucinations, sleep disturbance and suicidal ideas.   All other systems reviewed and are negative.       Observations/Objective: No vital signs or physical exam, this  was a telephone or virtual health encounter.  Pt alert and oriented, answers all questions appropriately, and able to speak in full sentences.    Assessment and Plan: Destani was seen today for covid positive.  Diagnoses and all orders for this visit:  COVID-19 virus infection Positive for COVID-19. High risk for dehydration and severe illness. Will start antiviral therapy. Continue symptomatic care. Report any new, worsening, or persistent symptoms.  -     molnupiravir EUA (LAGEVRIO) 200 mg CAPS capsule; Take 4 capsules (800 mg total) by mouth 2 (two) times daily for 5 days.     Follow Up Instructions: Return if symptoms worsen or fail to improve.    I discussed the assessment and treatment plan with the patient. The patient was provided an opportunity to ask questions and all were answered. The patient agreed with the plan and demonstrated an understanding of the instructions.   The patient was advised to call back or seek an in-person evaluation if  the symptoms worsen or if the condition fails to improve as anticipated.  The above assessment and management plan was discussed with the patient. The patient verbalized understanding of and has agreed to the management plan. Patient is aware to call the clinic if they develop any new symptoms or if symptoms persist or worsen. Patient is aware when to return to the clinic for a follow-up visit. Patient educated on when it is appropriate to go to the emergency department.    I provided 12 minutes of non-face-to-face time during this encounter. The call started at 1225. The call ended at 1235. The other time was used for coordination of care.    Monia Pouch, FNP-C [facility] [address] [place], [place] [address] [phone] 01/20/2021

## 2021-01-22 ENCOUNTER — Telehealth: Payer: Self-pay

## 2021-01-22 NOTE — Telephone Encounter (Signed)
Patient called c/o of very bad HA, vision blurry, bilat hand numbness, and just feeling strange in general.  Patient tested positive for COVID Monday 01/18/2021 and molnupiravir was called in and started on 01/20/2021 after receiving test results. As I talked with patient I noted a significant delay in thought and speech. Advised patient to seek medicare care at emergency department as soon as possible - either a local hospital or med center with ED. Patient expressed understanding and willingness to go.

## 2021-02-01 ENCOUNTER — Ambulatory Visit (INDEPENDENT_AMBULATORY_CARE_PROVIDER_SITE_OTHER): Payer: 59 | Admitting: Physician Assistant

## 2021-02-04 ENCOUNTER — Other Ambulatory Visit: Payer: Self-pay

## 2021-02-04 ENCOUNTER — Ambulatory Visit (INDEPENDENT_AMBULATORY_CARE_PROVIDER_SITE_OTHER): Payer: 59 | Admitting: Physician Assistant

## 2021-02-04 VITALS — BP 113/76 | HR 77 | Temp 97.9°F | Ht 63.0 in | Wt 194.0 lb

## 2021-02-04 DIAGNOSIS — Z6841 Body Mass Index (BMI) 40.0 and over, adult: Secondary | ICD-10-CM

## 2021-02-04 DIAGNOSIS — Z9189 Other specified personal risk factors, not elsewhere classified: Secondary | ICD-10-CM

## 2021-02-04 DIAGNOSIS — R632 Polyphagia: Secondary | ICD-10-CM | POA: Diagnosis not present

## 2021-02-04 MED ORDER — TIRZEPATIDE 5 MG/0.5ML ~~LOC~~ SOAJ: 5.0000 mg | SUBCUTANEOUS | 0 refills | Status: DC

## 2021-02-04 NOTE — Progress Notes (Signed)
Chief Complaint:   OBESITY Brittney Farmer is here to discuss her progress with her obesity treatment plan along with follow-up of her obesity related diagnoses. [name] is on the Category 2 Plan and states she is following her eating plan approximately 50% of the time. [name] states she is walking 20 minutes 3-4 times per week.  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 She was able to restart her Ozempic after being off of it for 1 month, and her appetite is decreased. She is not always eating all of the food on her plan.  Subjective:   1. Polyphagia [name] is on Ozempic and reports having side effects of diarrhea and gas at times.  2. At risk for deficient intake of food The patient is at a higher than average risk of deficient intake of food due to inadequate intake.  Assessment/Plan:   1. Polyphagia [name] will discontinue Ozempic and start Mounjaro 5 mg as prescribed. Intensive lifestyle modifications are the first line treatment for this issue. We discussed several lifestyle modifications today and she will continue to work on diet, exercise and weight loss efforts. Orders and follow up as documented in patient record.  Counseling Polyphagia is excessive hunger. Causes can include: low blood sugars, hypERthyroidism, PMS, lack of sleep, stress, insulin resistance, diabetes, certain medications, and diets that are deficient in protein and fiber.   Start- tirzepatide [facility]) 5 MG/0.5ML Pen; Inject 5 mg into the skin once a week.  Dispense: 2 mL; Refill: 0  2. At risk for deficient intake of food [name] was given approximately 15 minutes of deficit intake of food prevention counseling today. [name] is at risk for eating too few  calories based on current food recall. She was encouraged to focus on meeting caloric and protein goals according to her recommended meal plan.    3. Obesity with current BMI of 34.37  Darleny is currently in the action stage of change. As such, her goal is to continue with weight loss efforts. She has agreed to the Category 2 Plan.   Exercise goals:  As is  Behavioral modification strategies: meal planning and cooking strategies and keeping healthy foods in the home.  [name] has agreed to follow-up with our clinic in 3 weeks. She was informed of the importance of frequent follow-up visits to maximize her success with intensive lifestyle modifications for her multiple health conditions.   Objective:   Blood pressure 113/76, pulse 77, temperature 97.9 F (36.6 C), height 5\' 3"  (1.6 m), weight 194 lb (88 kg), SpO2 97 %. Body mass index is 34.37 kg/m.  General: Cooperative, alert, well developed, in no acute distress. HEENT: Conjunctivae and lids unremarkable. Cardiovascular: Regular rhythm.  Lungs: Normal work of breathing. Neurologic: No focal deficits.   Lab Results  Component Value Date   CREATININE 0.76 09/01/2020   BUN 11 09/01/2020   NA 143 09/01/2020   K 4.6 09/01/2020   CL 107 (H) 09/01/2020   CO2 22 09/01/2020   Lab Results  Component Value Date   ALT 11 09/01/2020   AST 11 09/01/2020   ALKPHOS 114 09/01/2020   BILITOT 0.4 09/01/2020   Lab Results  Component Value Date   HGBA1C 5.1 02/20/2020   Lab Results  Component  Value Date   INSULIN 5.2 02/20/2020   Lab Results  Component Value Date   TSH 1.010 02/20/2020   Lab Results  Component Value Date   CHOL 199 09/01/2020   HDL 40 09/01/2020   LDLCALC 142 (H) 09/01/2020   TRIG 96 09/01/2020   CHOLHDL 5.0 (H) 09/01/2020   Lab Results  Component Value Date   VD25OH 69.1 09/01/2020   VD25OH 30.0 02/20/2020   VD25OH 33.9 08/04/2014   Lab Results  Component Value Date   WBC 7.5 07/01/2020   HGB 14.1  07/01/2020   HCT 43.2 07/01/2020   MCV 87 07/01/2020   PLT 353 07/01/2020   Lab Results  Component Value Date   IRON 38 02/20/2020   TIBC 284 02/20/2020   FERRITIN 103 02/20/2020    Attestation Statements:   Reviewed by clinician on day of visit: allergies, medications, problem list, medical history, surgical history, family history, social history, and previous encounter notes.  Coral Ceo, CMA, am acting as transcriptionist for [employer], PA-C.  I have reviewed the above documentation for accuracy and completeness, and I agree with the above. Abby Potash, PA-C

## 2021-02-05 ENCOUNTER — Other Ambulatory Visit (INDEPENDENT_AMBULATORY_CARE_PROVIDER_SITE_OTHER): Payer: Self-pay | Admitting: Physician Assistant

## 2021-02-05 DIAGNOSIS — R632 Polyphagia: Secondary | ICD-10-CM

## 2021-02-08 NOTE — Telephone Encounter (Signed)
Prior authorization has been started for Mounjaro. Will notify patient and provider once response is received.  

## 2021-02-09 ENCOUNTER — Encounter (INDEPENDENT_AMBULATORY_CARE_PROVIDER_SITE_OTHER): Payer: Self-pay

## 2021-02-15 ENCOUNTER — Other Ambulatory Visit: Payer: Self-pay | Admitting: Family

## 2021-02-15 ENCOUNTER — Ambulatory Visit: Payer: Self-pay | Admitting: Gastroenterology

## 2021-02-16 ENCOUNTER — Encounter: Payer: Self-pay | Admitting: Family

## 2021-02-16 ENCOUNTER — Other Ambulatory Visit: Payer: Self-pay

## 2021-02-16 ENCOUNTER — Ambulatory Visit (INDEPENDENT_AMBULATORY_CARE_PROVIDER_SITE_OTHER): Payer: 59 | Admitting: Family

## 2021-02-16 VITALS — BP 110/73 | HR 84 | Temp 97.8°F | Ht 63.0 in | Wt 194.4 lb

## 2021-02-16 DIAGNOSIS — E7849 Other hyperlipidemia: Secondary | ICD-10-CM

## 2021-02-16 DIAGNOSIS — D649 Anemia, unspecified: Secondary | ICD-10-CM

## 2021-02-16 DIAGNOSIS — G43109 Migraine with aura, not intractable, without status migrainosus: Secondary | ICD-10-CM | POA: Diagnosis not present

## 2021-02-16 DIAGNOSIS — K219 Gastro-esophageal reflux disease without esophagitis: Secondary | ICD-10-CM

## 2021-02-16 DIAGNOSIS — E669 Obesity, unspecified: Secondary | ICD-10-CM

## 2021-02-16 DIAGNOSIS — F339 Major depressive disorder, recurrent, unspecified: Secondary | ICD-10-CM | POA: Diagnosis not present

## 2021-02-16 DIAGNOSIS — F411 Generalized anxiety disorder: Secondary | ICD-10-CM

## 2021-02-16 MED ORDER — BUSPIRONE HCL 7.5 MG PO TABS: 7.5000 mg | ORAL_TABLET | Freq: Three times a day (TID) | ORAL | 1 refills | Status: DC | PRN

## 2021-02-16 MED ORDER — BUPROPION HCL ER (XL) 300 MG PO TB24: 300.0000 mg | ORAL_TABLET | Freq: Every day | ORAL | 1 refills | Status: DC

## 2021-02-16 NOTE — Patient Instructions (Signed)
Major Depressive Disorder, Adult Major depressive disorder (MDD) is a mental health condition. It may also be called clinical depression or unipolar depression. MDD causes symptoms of sadness, hopelessness, and loss of interest in things. These symptoms last most of the day, almost every day, for 2 weeks. MDD can also cause physical symptoms. It can interfere with relationships and with everyday activities,such as work, school, and activities that are usually pleasant. MDD may be mild, moderate, or severe. It may be single-episode MDD, whichhappens once, or recurrent MDD, which may occur multiple times. What are the causes? The exact cause of this condition is not known. MDD is most likely caused by a combination of things, which may include: Your personality traits. Learned or conditioned behaviors or thoughts or feelings that reinforce negativity. Any alcohol or substance misuse. Long-term (chronic) physical or mental health illness. Going through a traumatic experience or major life changes. What increases the risk? The following factors may make someone more likely to develop MDD: A family history of depression. Being a woman. Troubled family relationships. Abnormally low levels of certain brain chemicals. Traumatic or painful events in childhood, especially abuse or loss of a parent. A lot of stress from life experiences, such as poor living conditions or discrimination. Chronic physical illness or other mental health disorders. What are the signs or symptoms? The main symptoms of MDD usually include: Constant depressed or irritable mood. A loss of interest in things and activities. Other symptoms include: Sleeping or eating too much or too little. Unexplained weight gain or weight loss. Tiredness or low energy. Being agitated, restless, or weak. Feeling hopeless, worthless, or guilty. Trouble thinking clearly or making decisions. Thoughts of suicide or thoughts of harming  others. Isolating oneself or avoiding other people or activities. Trouble completing tasks, work, or any normal obligations. Severe symptoms of this condition may include: Psychotic depression.This may include false beliefs, or delusions. It may also include seeing, hearing, tasting, smelling, or feeling things that are not real (hallucinations). Chronic depression or persistent depressive disorder. This is low-level depression that lasts for at least 2 years. Melancholic depression, or feeling extremely sad and hopeless. Catatonic depression, which includes trouble speaking and trouble moving. How is this diagnosed? This condition may be diagnosed based on: Your symptoms. Your medical and mental health history. You may be asked questions about your lifestyle, including any drug and alcohol use. A physical exam. Blood tests to rule out other conditions. MDD is confirmed if you have the following symptoms most of the day, nearly every day, in a 2-week period: Either a depressed mood or loss of interest. At least four other MDD symptoms. How is this treated? This condition is usually treated by mental health professionals, such as psychologists, psychiatrists, and clinical social workers. You may need more than one type of treatment. Treatment may include: Psychotherapy, also called talk therapy or counseling. Types of psychotherapy include: Cognitive behavioral therapy (CBT). This teaches you to recognize unhealthy feelings, thoughts, and behaviors, and replace them with positive thoughts and actions. Interpersonal therapy (IPT). This helps you to improve the way you communicate with others or relate to them. Family therapy. This treatment includes members of your family. Medicines to treat anxiety and depression. These medicines help to balance the brain chemicals that affect your emotions. Lifestyle changes. You may be asked to: Limit alcohol use and avoid drug use. Get regular  exercise. Get plenty of sleep. Make healthy eating choices. Spend more time outdoors. Brain stimulation. This may be done   if symptoms are very severe and other treatments have not worked. Examples of this treatment are electroconvulsive therapy and transcranial magnetic stimulation. Follow these instructions at home: Activity Exercise regularly and spend time outdoors. Find activities that you enjoy doing, and make time to do them. Find healthy ways to manage stress, such as: Meditation or deep breathing. Spending time in nature. Journaling. Return to your normal activities as told by your health care provider. Ask your health care provider what activities are safe for you. Alcohol and drug use If you drink alcohol: Limit how much you use to: 0-1 drink a day for women who are not pregnant. 0-2 drinks a day for men. Be aware of how much alcohol is in your drink. In the [place] one drink equals one 12 oz bottle of beer (355 mL), one 5 oz glass of wine (148 mL), or one 1 oz glass of hard liquor (44 mL). Discuss your alcohol use with your health care provider. Alcohol can affect any antidepressant medicines you are taking. Discuss any drug use with your health care provider. General instructions  Take over-the-counter and prescription medicines only as told by your health care provider. Eat a healthy diet and get plenty of sleep. Consider joining a support group. Your health care provider may be able to recommend one. Keep all follow-up visits as told by your health care provider. This is important.  Where to find more information National Alliance on Mental Illness: www.nami.org [place] National Institute of Mental Health: www.nimh.nih.gov Contact a health care provider if: Your symptoms get worse. You develop new symptoms. Get help right away if: You self-harm. You have serious thoughts about hurting yourself or others. You hallucinate. If you ever feel like you may hurt yourself or  others, or have thoughts about taking your own life, get help right away. Go to your nearest emergency department or: Call your local emergency services (911 in the [place] Call a suicide crisis helpline, such as the National Suicide Prevention Lifeline at 1-800-273-8255. This is open 24 hours a day in the [place] Text the Crisis Text Line at 741741 (in the [place] Summary Major depressive disorder (MDD) is a mental health condition. MDD causes symptoms of sadness, hopelessness, and loss of interest in things. These symptoms last most of the day, almost every day, for 2 weeks. The symptoms of MDD can interfere with relationships and with everyday activities. Treatments and support are available for people who develop MDD. You may need more than one type of treatment. Get help right away if you have serious thoughts about hurting yourself or others. This information is not intended to replace advice given to you by your health care provider. Make sure you discuss any questions you have with your healthcare provider. Document Revised: 03/30/2019 Document Reviewed: 03/30/2019 Elsevier Patient Education  2022 Elsevier Inc.  

## 2021-02-16 NOTE — Progress Notes (Signed)
Subjective:    Patient ID: Brittney Farmer, female    DOB: 03/05/1986, 35 y.o.   MRN: [id number]  Chief Complaint  Patient presents with   Medical Management of Chronic Issues   PT presents to the office today for chronic follow up. She is followed by the [facility] loss clinic every 3 weeks. She reports she has lost 40 lbs.    She is followed by GI. She had colonoscopy and EGD on 07/27/20. She was seen on 04/02/20 and had a negative CBC, H pylori, and US abdomen.  Gastroesophageal Reflux She complains of belching and heartburn. This is a chronic problem. The current episode started more than 1 year ago. The problem occurs occasionally. The problem has been waxing and waning. She has tried a PPI for the symptoms. The treatment provided moderate relief.  Hyperlipidemia This is a chronic problem. The current episode started more than 1 year ago. Exacerbating diseases include obesity. Current antihyperlipidemic treatment includes diet change. The current treatment provides mild improvement of lipids. Risk factors for coronary artery disease include dyslipidemia and a sedentary lifestyle.  Anxiety Presents for follow-up visit. Symptoms include depressed mood, excessive worry, irritability, nervous/anxious behavior and restlessness. Symptoms occur occasionally. The severity of symptoms is moderate.   Her past medical history is significant for anemia.  Depression        This is a chronic problem.  The current episode started more than 1 year ago.   The problem occurs intermittently.  Associated symptoms include helplessness, hopelessness, irritable, restlessness and sad.  Past treatments include SSRIs - Selective serotonin reuptake inhibitors.  Past medical history includes anxiety.   Anemia Presents for follow-up visit. Symptoms include malaise/fatigue.  Migraine  This is a chronic problem. The current episode started more than 1 year ago. The problem occurs seasonly. The pain quality is similar to  prior headaches. Her past medical history is significant for obesity.     Review of Systems  Constitutional:  Positive for irritability and malaise/fatigue.  Gastrointestinal:  Positive for heartburn.  Psychiatric/Behavioral:  Positive for depression. The patient is nervous/anxious.   All other systems reviewed and are negative.     Objective:   Physical Exam Vitals reviewed.  Constitutional:      General: She is irritable. She is not in acute distress.    Appearance: She is well-developed. She is obese.  HENT:     Head: Normocephalic and atraumatic.     Right Ear: Tympanic membrane normal.     Left Ear: Tympanic membrane normal.  Eyes:     Pupils: Pupils are equal, round, and reactive to light.  Neck:     Thyroid: No thyromegaly.  Cardiovascular:     Rate and Rhythm: Normal rate and regular rhythm.     Heart sounds: Normal heart sounds. No murmur heard. Pulmonary:     Effort: Pulmonary effort is normal. No respiratory distress.     Breath sounds: Normal breath sounds. No wheezing.  Abdominal:     General: Bowel sounds are normal. There is no distension.     Palpations: Abdomen is soft.     Tenderness: There is no abdominal tenderness.  Musculoskeletal:        General: No tenderness. Normal range of motion.     Cervical back: Normal range of motion and neck supple.  Skin:    General: Skin is warm and dry.  Neurological:     Mental Status: She is alert and oriented to person, place, and time.  Cranial Nerves: No cranial nerve deficit.     Deep Tendon Reflexes: Reflexes are normal and symmetric.  Psychiatric:        Behavior: Behavior normal.        Thought Content: Thought content normal.        Judgment: Judgment normal.      BP 110/73   Pulse 84   Temp 97.8 F (36.6 C) (Temporal)   Ht 5\' 3"  (1.6 m)   Wt 194 lb 6.4 oz (88.2 kg)   BMI 34.44 kg/m      Assessment & Plan:  [name] comes in today with chief complaint of Medical Management of Chronic  Issues   Diagnosis and orders addressed:  1. Migraine with aura and without status migrainosus, not intractable  2. Gastroesophageal reflux disease without esophagitis  3. Other hyperlipidemia  4. Recurrent major depressive disorder, remission status unspecified (HCC) Will change Wellbutrin to 300 mg daily  Will increase Buspar to 7.5 mg from 5 mg.  Stress management  - buPROPion (WELLBUTRIN XL) 300 MG 24 hr tablet; Take 1 tablet (300 mg total) by mouth daily.  Dispense: 90 tablet; Refill: 1 - busPIRone (BUSPAR) 7.5 MG tablet; Take 1 tablet (7.5 mg total) by mouth 3 (three) times daily as needed.  Dispense: 90 tablet; Refill: 1  5. Anemia, unspecified type  6. GAD (generalized anxiety disorder) - buPROPion (WELLBUTRIN XL) 300 MG 24 hr tablet; Take 1 tablet (300 mg total) by mouth daily.  Dispense: 90 tablet; Refill: 1 - busPIRone (BUSPAR) 7.5 MG tablet; Take 1 tablet (7.5 mg total) by mouth 3 (three) times daily as needed.  Dispense: 90 tablet; Refill: 1  7. Obesity (BMI 30-39.9)   Labs pending Health Maintenance reviewed Diet and exercise encouraged  Follow up plan: 6 months    Evelina Dun, FNP

## 2021-02-23 ENCOUNTER — Other Ambulatory Visit: Payer: Self-pay

## 2021-02-23 ENCOUNTER — Encounter: Payer: Self-pay | Admitting: Family

## 2021-02-23 ENCOUNTER — Ambulatory Visit (INDEPENDENT_AMBULATORY_CARE_PROVIDER_SITE_OTHER): Payer: 59 | Admitting: Family

## 2021-02-23 VITALS — BP 116/75 | HR 93 | Temp 97.6°F | Ht 63.0 in | Wt 194.6 lb

## 2021-02-23 DIAGNOSIS — F39 Unspecified mood [affective] disorder: Secondary | ICD-10-CM | POA: Diagnosis not present

## 2021-02-23 DIAGNOSIS — F411 Generalized anxiety disorder: Secondary | ICD-10-CM | POA: Diagnosis not present

## 2021-02-23 DIAGNOSIS — Z79899 Other long term (current) drug therapy: Secondary | ICD-10-CM

## 2021-02-23 MED ORDER — ALPRAZOLAM 0.5 MG PO TABS
0.5000 mg | ORAL_TABLET | Freq: Every evening | ORAL | 0 refills | Status: DC | PRN
Start: 2021-02-23 — End: 2021-02-23

## 2021-02-23 MED ORDER — ALPRAZOLAM 0.25 MG PO TABS: 0.2500 mg | ORAL_TABLET | Freq: Two times a day (BID) | ORAL | 0 refills | Status: DC | PRN

## 2021-02-23 NOTE — Patient Instructions (Signed)
Generalized Anxiety Disorder, Adult Generalized anxiety disorder (GAD) is a mental health condition. Unlike normal worries, anxiety related to GAD is not triggered by a specific event. These worries do not fade or get better with time. GAD interferes with relationships, work, and school. GAD symptoms can vary from mild to severe. People with severe GAD can have intense waves of anxiety with physical symptoms that are similar to panic attacks. What are the causes? The exact cause of GAD is not known, but the following are believed to have an impact: Differences in natural brain chemicals. Genes passed down from parents to children. Differences in the way threats are perceived. Development during childhood. Personality. What increases the risk? The following factors may make you more likely to develop this condition: Being female. Having a family history of anxiety disorders. Being very shy. Experiencing very stressful life events, such as the death of a loved one. Having a very stressful family environment. What are the signs or symptoms? People with GAD often worry excessively about many things in their lives, such as their health and family. Symptoms may also include: Mental and emotional symptoms: Worrying excessively about natural disasters. Fear of being late. Difficulty concentrating. Fears that others are judging your performance. Physical symptoms: Fatigue. Headaches, muscle tension, muscle twitches, trembling, or feeling shaky. Feeling like your heart is pounding or beating very fast. Feeling out of breath or like you cannot take a deep breath. Having trouble falling asleep or staying asleep, or experiencing restlessness. Sweating. Nausea, diarrhea, or irritable bowel syndrome (IBS). Behavioral symptoms: Experiencing erratic moods or irritability. Avoidance of new situations. Avoidance of people. Extreme difficulty making decisions. How is this diagnosed? This condition  is diagnosed based on your symptoms and medical history. You will also have a physical exam. Your health care provider may perform tests to rule out other possible causes of your symptoms. To be diagnosed with GAD, a person must have anxiety that: Is out of his or her control. Affects several different aspects of his or her life, such as work and relationships. Causes distress that makes him or her unable to take part in normal activities. Includes at least three symptoms of GAD, such as restlessness, fatigue, trouble concentrating, irritability, muscle tension, or sleep problems. Before your health care provider can confirm a diagnosis of GAD, these symptoms must be present more days than they are not, and they must last for 6 months or longer. How is this treated? This condition may be treated with: Medicine. Antidepressant medicine is usually prescribed for long-term daily control. Anti-anxiety medicines may be added in severe cases, especially when panic attacks occur. Talk therapy (psychotherapy). Certain types of talk therapy can be helpful in treating GAD by providing support, education, and guidance. Options include: Cognitive behavioral therapy (CBT). People learn coping skills and self-calming techniques to ease their physical symptoms. They learn to identify unrealistic thoughts and behaviors and to replace them with more appropriate thoughts and behaviors. Acceptance and commitment therapy (ACT). This treatment teaches people how to be mindful as a way to cope with unwanted thoughts and feelings. Biofeedback. This process trains you to manage your body's response (physiological response) through breathing techniques and relaxation methods. You will work with a therapist while machines are used to monitor your physical symptoms. Stress management techniques. These include yoga, meditation, and exercise. A mental health specialist can help determine which treatment is best for you. Some  people see improvement with one type of therapy. However, other people require a combination   of therapies. Follow these instructions at home: Lifestyle Maintain a consistent routine and schedule. Anticipate stressful situations. Create a plan, and allow extra time to work with your plan. Practice stress management or self-calming techniques that you have learned from your therapist or your health care provider. General instructions Take over-the-counter and prescription medicines only as told by your health care provider. Understand that you are likely to have setbacks. Accept this and be kind to yourself as you persist to take better care of yourself. Recognize and accept your accomplishments, even if you judge them as small. Keep all follow-up visits as told by your health care provider. This is important. Contact a health care provider if: Your symptoms do not get better. Your symptoms get worse. You have signs of depression, such as: A persistently sad or irritable mood. Loss of enjoyment in activities that used to bring you joy. Change in weight or eating. Changes in sleeping habits. Avoiding friends or family members. Loss of energy for normal tasks. Feelings of guilt or worthlessness. Get help right away if: You have serious thoughts about hurting yourself or others. If you ever feel like you may hurt yourself or others, or have thoughts about taking your own life, get help right away. Go to your nearest emergency department or: Call your local emergency services (911 in the [place] Call a suicide crisis helpline, such as the National Suicide Prevention Lifeline at 1-800-273-8255. This is open 24 hours a day in the [place] Text the Crisis Text Line at 741741 (in the [place] Summary Generalized anxiety disorder (GAD) is a mental health condition that involves worry that is not triggered by a specific event. People with GAD often worry excessively about many things in their lives, such  as their health and family. GAD may cause symptoms such as restlessness, trouble concentrating, sleep problems, frequent sweating, nausea, diarrhea, headaches, and trembling or muscle twitching. A mental health specialist can help determine which treatment is best for you. Some people see improvement with one type of therapy. However, other people require a combination of therapies. This information is not intended to replace advice given to you by your health care provider. Make sure you discuss any questions you have with your health care provider. Document Revised: 02/06/2019 Document Reviewed: 02/06/2019 Elsevier Patient Education  2022 Elsevier Inc.  

## 2021-02-23 NOTE — Progress Notes (Signed)
Subjective:    Patient ID: Brittney Farmer, female    DOB: [birth year], 35 y.o.   MRN: [id number]  Chief Complaint  Patient presents with   Anxiety    Discuss xanax    PT presents to the office today to discuss GAD. She is going through a divorce. She states she was prescribed xanax years ago in 2015. She still had a pill left and took this. She needs an updated prescription.   Anxiety Presents for follow-up visit. Symptoms include dizziness, excessive worry, irritability, nervous/anxious behavior and restlessness.       Review of Systems  Constitutional:  Positive for irritability.  Neurological:  Positive for dizziness.  Psychiatric/Behavioral:  The patient is nervous/anxious.   All other systems reviewed and are negative.     Objective:   Physical Exam Vitals reviewed.  Constitutional:      General: She is not in acute distress.    Appearance: She is well-developed. She is obese.  HENT:     Head: Normocephalic and atraumatic.     Right Ear: Tympanic membrane normal.     Left Ear: Tympanic membrane normal.  Eyes:     Pupils: Pupils are equal, round, and reactive to light.  Neck:     Thyroid: No thyromegaly.  Cardiovascular:     Rate and Rhythm: Normal rate and regular rhythm.     Heart sounds: Normal heart sounds. No murmur heard. Pulmonary:     Effort: Pulmonary effort is normal. No respiratory distress.     Breath sounds: Normal breath sounds. No wheezing.  Abdominal:     General: Bowel sounds are normal. There is no distension.     Palpations: Abdomen is soft.     Tenderness: There is no abdominal tenderness.  Musculoskeletal:        General: No tenderness. Normal range of motion.     Cervical back: Normal range of motion and neck supple.  Skin:    General: Skin is warm and dry.  Neurological:     Mental Status: She is alert and oriented to person, place, and time.     Cranial Nerves: No cranial nerve deficit.     Deep Tendon Reflexes: Reflexes are normal  and symmetric.  Psychiatric:        Behavior: Behavior normal.        Thought Content: Thought content normal.        Judgment: Judgment normal.    BP 116/75   Pulse 93   Temp 97.6 F (36.4 C) (Temporal)   Ht 5\' 3"  (1.6 m)   Wt 194 lb 9.6 oz (88.3 kg)   BMI 34.47 kg/m '    Assessment & Plan:   Chevy Virgo comes in today with chief complaint of Anxiety (Discuss xanax )   Diagnosis and orders addressed:  1. Mood disorder ([place])   2. GAD (generalized anxiety disorder)  - ALPRAZolam (XANAX) 0.5 MG tablet; Take 1 tablet (0.5 mg total) by mouth at bedtime as needed for anxiety.  Dispense: 4 tablet; Refill: 0 - ToxASSURE Select 13 (MW), Urine  3. Controlled substance agreement signed  - ALPRAZolam (XANAX) 0.5 MG tablet; Take 1 tablet (0.5 mg total) by mouth at bedtime as needed for anxiety.  Dispense: 4 tablet; Refill: 0 - ToxASSURE Select 13 (MW), Urine  Will give short dose of xanax Continue Lexapro and Wellbutrin  Stress management  Patient reviewed in [place] controlled database, no flags noted. Contract and drug screen up dated today  Evelina Dun, FNP

## 2021-02-25 ENCOUNTER — Other Ambulatory Visit: Payer: Self-pay

## 2021-02-25 ENCOUNTER — Ambulatory Visit (INDEPENDENT_AMBULATORY_CARE_PROVIDER_SITE_OTHER): Payer: 59 | Admitting: Physician Assistant

## 2021-02-25 ENCOUNTER — Encounter (INDEPENDENT_AMBULATORY_CARE_PROVIDER_SITE_OTHER): Payer: Self-pay | Admitting: Physician Assistant

## 2021-02-25 VITALS — BP 108/74 | HR 81 | Temp 97.4°F | Ht 63.0 in | Wt 191.0 lb

## 2021-02-25 DIAGNOSIS — Z6841 Body Mass Index (BMI) 40.0 and over, adult: Secondary | ICD-10-CM | POA: Diagnosis not present

## 2021-02-25 DIAGNOSIS — R632 Polyphagia: Secondary | ICD-10-CM | POA: Diagnosis not present

## 2021-02-25 DIAGNOSIS — Z9189 Other specified personal risk factors, not elsewhere classified: Secondary | ICD-10-CM

## 2021-02-25 MED ORDER — TIRZEPATIDE 5 MG/0.5ML ~~LOC~~ SOAJ: 5.0000 mg | SUBCUTANEOUS | 0 refills | Status: DC

## 2021-02-25 NOTE — Progress Notes (Signed)
Chief Complaint:   OBESITY Brittney Farmer is here to discuss her progress with her obesity treatment plan along with follow-up of her obesity related diagnoses. Brittney Farmer is on the Category 2 Plan and states she is following her eating plan approximately 50% of the time. Brittney Farmer states she is walking for 15-20 minutes 2-3 times per week.  Today's visit was #: 16 Starting weight: 235 lbs Starting date: 02/20/2020 Today's weight: 191 lbs Today's date: 02/25/2021 Total lbs lost to date: 44 Total lbs lost since last in-office visit: 3  Interim History: Brittney Farmer is only eating one meal a day due to personal issues at home. She has not started [facility] as of yet due to problems with her insurance.  Subjective:   1. Polyphagia Brittney Farmer has not taken [facility] as she has not heard from the pharmacy about it being covered. She denies hunger.  2. At risk for deficient intake of food The patient is at a higher than average risk of deficient intake of food due to only eating one meal a day.  Assessment/Plan:   1. Polyphagia Intensive lifestyle modifications are the first line treatment for this issue. We discussed several lifestyle modifications today. We will refill Mounjaro 5 mg for 1 month. Brittney Farmer will continue to work on diet, exercise and weight loss efforts. Orders and follow up as documented in patient record.  Counseling Polyphagia is excessive hunger. Causes can include: low blood sugars, hypERthyroidism, PMS, lack of sleep, stress, insulin resistance, diabetes, certain medications, and diets that are deficient in protein and fiber.   - tirzepatide (MOUNJARO) 5 MG/0.5ML Pen; Inject 5 mg into the skin once a week.  Dispense: 2 mL; Refill: 0  2. At risk for deficient intake of food Brittney Farmer was given approximately 15 minutes of deficit intake of food prevention counseling today. Brittney Farmer is at risk for eating too few calories based on current food recall. She was encouraged to focus on  meeting caloric and protein goals according to her recommended meal plan.   3. Obesity with current BMI of 33.84 Brittney Farmer is currently in the action stage of change. As such, her goal is to continue with weight loss efforts. She has agreed to the Category 2 Plan.   Exercise goals: As is.  Behavioral modification strategies: increasing lean protein intake and no skipping meals.  Brittney Farmer has agreed to follow-up with our clinic in 3 to 4 weeks. She was informed of the importance of frequent follow-up visits to maximize her success with intensive lifestyle modifications for her multiple health conditions.   Objective:   Blood pressure 108/74, pulse 81, temperature (!) 97.4 F (36.3 C), height 5\' 3"  (1.6 m), weight 191 lb (86.6 kg), SpO2 99 %. Body mass index is 33.83 kg/m.  General: Cooperative, alert, well developed, in no acute distress. HEENT: Conjunctivae and lids unremarkable. Cardiovascular: Regular rhythm.  Lungs: Normal work of breathing. Neurologic: No focal deficits.   Lab Results  Component Value Date   CREATININE 0.76 09/01/2020   BUN 11 09/01/2020   NA 143 09/01/2020   K 4.6 09/01/2020   CL 107 (H) 09/01/2020   CO2 22 09/01/2020   Lab Results  Component Value Date   ALT 11 09/01/2020   AST 11 09/01/2020   ALKPHOS 114 09/01/2020   BILITOT 0.4 09/01/2020   Lab Results  Component Value Date   HGBA1C 5.1 02/20/2020   Lab Results  Component Value Date   INSULIN 5.2 02/20/2020   Lab Results  Component Value  Date   TSH 1.010 02/20/2020   Lab Results  Component Value Date   CHOL 199 09/01/2020   HDL 40 09/01/2020   LDLCALC 142 (H) 09/01/2020   TRIG 96 09/01/2020   CHOLHDL 5.0 (H) 09/01/2020   Lab Results  Component Value Date   VD25OH 69.1 09/01/2020   VD25OH 30.0 02/20/2020   VD25OH 33.9 08/04/2014   Lab Results  Component Value Date   WBC 7.5 07/01/2020   HGB 14.1 07/01/2020   HCT 43.2 07/01/2020   MCV 87 07/01/2020   PLT 353 07/01/2020    Lab Results  Component Value Date   IRON 38 02/20/2020   TIBC 284 02/20/2020   FERRITIN 103 02/20/2020   Attestation Statements:   Reviewed by clinician on day of visit: allergies, medications, problem list, medical history, surgical history, family history, social history, and previous encounter notes.   Brittney Farmer, am acting as transcriptionist for [employer], PA-C.  I have reviewed the above documentation for accuracy and completeness, and I agree with the above. Brittney Potash, PA-C

## 2021-02-28 LAB — TOXASSURE SELECT 13 (MW), URINE

## 2021-03-03 ENCOUNTER — Other Ambulatory Visit (INDEPENDENT_AMBULATORY_CARE_PROVIDER_SITE_OTHER): Payer: Self-pay | Admitting: Family Medicine

## 2021-03-03 DIAGNOSIS — R632 Polyphagia: Secondary | ICD-10-CM

## 2021-03-17 ENCOUNTER — Other Ambulatory Visit: Payer: Self-pay | Admitting: Family

## 2021-03-17 DIAGNOSIS — F411 Generalized anxiety disorder: Secondary | ICD-10-CM

## 2021-03-17 DIAGNOSIS — F339 Major depressive disorder, recurrent, unspecified: Secondary | ICD-10-CM

## 2021-03-23 ENCOUNTER — Ambulatory Visit (INDEPENDENT_AMBULATORY_CARE_PROVIDER_SITE_OTHER): Payer: 59 | Admitting: Physician Assistant

## 2021-04-12 ENCOUNTER — Telehealth: Payer: Self-pay | Admitting: Family

## 2021-04-12 NOTE — Telephone Encounter (Signed)
Left detailed message we do not have these cards will have to go online and print her self.

## 2021-04-14 ENCOUNTER — Other Ambulatory Visit: Payer: Self-pay

## 2021-04-14 ENCOUNTER — Encounter (INDEPENDENT_AMBULATORY_CARE_PROVIDER_SITE_OTHER): Payer: Self-pay | Admitting: Family Medicine

## 2021-04-14 ENCOUNTER — Ambulatory Visit (INDEPENDENT_AMBULATORY_CARE_PROVIDER_SITE_OTHER): Payer: 59 | Admitting: Family Medicine

## 2021-04-14 VITALS — BP 115/75 | HR 77 | Temp 97.8°F | Ht 63.0 in | Wt 196.0 lb

## 2021-04-14 DIAGNOSIS — F39 Unspecified mood [affective] disorder: Secondary | ICD-10-CM

## 2021-04-14 DIAGNOSIS — Z6834 Body mass index (BMI) 34.0-34.9, adult: Secondary | ICD-10-CM

## 2021-04-14 DIAGNOSIS — Z9189 Other specified personal risk factors, not elsewhere classified: Secondary | ICD-10-CM

## 2021-04-14 DIAGNOSIS — R632 Polyphagia: Secondary | ICD-10-CM

## 2021-04-14 MED ORDER — OZEMPIC (2 MG/DOSE) 8 MG/3ML ~~LOC~~ SOPN: 2.0000 mg | PEN_INJECTOR | SUBCUTANEOUS | 0 refills | Status: DC

## 2021-04-15 NOTE — Progress Notes (Signed)
Chief Complaint:   OBESITY Brittney Farmer is here to discuss her progress with her obesity treatment plan along with follow-up of her obesity related diagnoses. Brittney Farmer is on the Category 2 Plan and states she is following her eating plan approximately 0% of the time. Brittney Farmer states she is not currently exercising.  Today's visit was #: 21 Starting weight: 235 lbs Starting date: 02/20/2020 Today's weight: 196 lbs Today's date: 04/14/2021 Total lbs lost to date: 39 Total lbs lost since last in-office visit: +5  Interim History: Brittney Farmer states she has been down lately, as she is going through a separation with her husband. She has increased stress and is skipping a lot of meals, then when she does eat, it's chips and snacks.  Subjective:   1. Polyphagia Brittney Farmer's last dose of Ozempic was last week. Provider tried to change to [facility] at the end of October but insurance would not cover it.  2. Mood disorder ([place]) Pt hasn't been consistent with meds at all. Due to stress, self-care has suffered. Pt denies suicidal or homicidal ideations.  3. At risk for depression Brittney Farmer is at elevated risk of depression due to going through a separation with her husband.  Assessment/Plan:  No orders of the defined types were placed in this encounter.   Medications Discontinued During This Encounter  Medication Reason   tirzepatide [facility]) 5 MG/0.5ML Pen      Meds ordered this encounter  Medications   Semaglutide, 2 MG/DOSE, (OZEMPIC, 2 MG/DOSE,) 8 MG/3ML SOPN    Sig: Inject 2 mg into the skin once a week.    Dispense:  3 mL    Refill:  0     1. Polyphagia Intensive lifestyle modifications are the first line treatment for this issue. We discussed several lifestyle modifications today and she will continue to work on diet, exercise and weight loss efforts. Orders and follow up as documented in patient record.  Counseling Polyphagia is excessive hunger. Causes can include: low blood  sugars, hypERthyroidism, PMS, lack of sleep, stress, insulin resistance, diabetes, certain medications, and diets that are deficient in protein and fiber.   Refill- Semaglutide, 2 MG/DOSE, (OZEMPIC, 2 MG/DOSE,) 8 MG/3ML SOPN; Inject 2 mg into the skin once a week.  Dispense: 3 mL; Refill: 0  2. Mood disorder ([place]) Behavior modification techniques were discussed today to help Brittney Farmer deal with her emotional/non-hunger eating behaviors.  Orders and follow up as documented in patient record.  Exercise, meditation, and sleep hygiene all discussed with pt. Medication compliance is very important for mood stability.  3. At risk for depression Brittney Farmer was given approximately 15 minutes of depression risk counseling today. She has risk factors for depression. We discussed the importance of a healthy work life balance, a healthy relationship with food and a good support system.  Repetitive spaced learning was employed today to elicit superior memory formation and behavioral change.  4. Obesity with current BMI of 34.8  Brittney Farmer is currently in the action stage of change. As such, her goal is to continue with weight loss efforts. She has agreed to change to practicing portion control and making smarter food choices, such as increasing vegetables and decreasing simple carbohydrates.   Pt's goals: Walk everyday and do 5 minutes of prayer or meditation daily.  Exercise goals:  Walk for 10-15 minutes daily on lunch break.  Behavioral modification strategies: increasing lean protein intake, decreasing simple carbohydrates, and planning for success.  Brittney Farmer has agreed to follow-up with our clinic in  3-4 weeks, fasting. She was informed of the importance of frequent follow-up visits to maximize her success with intensive lifestyle modifications for her multiple health conditions.   Objective:   Blood pressure 115/75, pulse 77, temperature 97.8 F (36.6 C), height 5\' 3"  (1.6 m), weight 196 lb (88.9 kg),  SpO2 100 %. Body mass index is 34.72 kg/m.  General: Cooperative, alert, well developed, in no acute distress. HEENT: Conjunctivae and lids unremarkable. Cardiovascular: Regular rhythm.  Lungs: Normal work of breathing. Neurologic: No focal deficits.   Lab Results  Component Value Date   CREATININE 0.76 09/01/2020   BUN 11 09/01/2020   NA 143 09/01/2020   K 4.6 09/01/2020   CL 107 (H) 09/01/2020   CO2 22 09/01/2020   Lab Results  Component Value Date   ALT 11 09/01/2020   AST 11 09/01/2020   ALKPHOS 114 09/01/2020   BILITOT 0.4 09/01/2020   Lab Results  Component Value Date   HGBA1C 5.1 02/20/2020   Lab Results  Component Value Date   INSULIN 5.2 02/20/2020   Lab Results  Component Value Date   TSH 1.010 02/20/2020   Lab Results  Component Value Date   CHOL 199 09/01/2020   HDL 40 09/01/2020   LDLCALC 142 (H) 09/01/2020   TRIG 96 09/01/2020   CHOLHDL 5.0 (H) 09/01/2020   Lab Results  Component Value Date   VD25OH 69.1 09/01/2020   VD25OH 30.0 02/20/2020   VD25OH 33.9 08/04/2014   Lab Results  Component Value Date   WBC 7.5 07/01/2020   HGB 14.1 07/01/2020   HCT 43.2 07/01/2020   MCV 87 07/01/2020   PLT 353 07/01/2020   Lab Results  Component Value Date   IRON 38 02/20/2020   TIBC 284 02/20/2020   FERRITIN 103 02/20/2020   Attestation Statements:   Reviewed by clinician on day of visit: allergies, medications, problem list, medical history, surgical history, family history, social history, and previous encounter notes.  Coral Ceo, CMA, am acting as transcriptionist for [employer], DO.  I have reviewed the above documentation for accuracy and completeness, and I agree with the above. Marjory Sneddon, D.O.  The [place] was signed into law in 2016 which includes the topic of electronic health records.  This provides immediate access to information in MyChart.  This includes consultation notes, operative notes,  office notes, lab results and pathology reports.  If you have any questions about what you read please let us know at your next visit so we can discuss your concerns and take corrective action if need be.  We are right here with you.

## 2021-05-12 ENCOUNTER — Ambulatory Visit (INDEPENDENT_AMBULATORY_CARE_PROVIDER_SITE_OTHER): Payer: 59 | Admitting: Physician Assistant

## 2021-05-12 ENCOUNTER — Other Ambulatory Visit: Payer: Self-pay

## 2021-05-12 ENCOUNTER — Encounter (INDEPENDENT_AMBULATORY_CARE_PROVIDER_SITE_OTHER): Payer: Self-pay | Admitting: Physician Assistant

## 2021-05-12 VITALS — BP 115/73 | HR 87 | Temp 97.9°F | Ht 63.0 in | Wt 196.0 lb

## 2021-05-12 DIAGNOSIS — Z9189 Other specified personal risk factors, not elsewhere classified: Secondary | ICD-10-CM

## 2021-05-12 DIAGNOSIS — E7849 Other hyperlipidemia: Secondary | ICD-10-CM | POA: Diagnosis not present

## 2021-05-12 DIAGNOSIS — R632 Polyphagia: Secondary | ICD-10-CM

## 2021-05-12 DIAGNOSIS — E559 Vitamin D deficiency, unspecified: Secondary | ICD-10-CM

## 2021-05-12 DIAGNOSIS — Z6841 Body Mass Index (BMI) 40.0 and over, adult: Secondary | ICD-10-CM

## 2021-05-12 MED ORDER — SEMAGLUTIDE (1 MG/DOSE) 4 MG/3ML ~~LOC~~ SOPN: 1.0000 mg | PEN_INJECTOR | SUBCUTANEOUS | 0 refills | Status: DC

## 2021-05-12 NOTE — Progress Notes (Signed)
Chief Complaint:   OBESITY Brittney Farmer is here to discuss her progress with her obesity treatment plan along with follow-up of her obesity related diagnoses. Brittney Farmer is on practicing portion control and making smarter food choices, such as increasing vegetables and decreasing simple carbohydrates and states she is following her eating plan approximately 0% of the time. Brittney Farmer states she is walking for 20 minutes 3-4 times per week.  Today's visit was #: 18 Starting weight: 235 lbs Starting date: 02/20/2020 Today's weight: 196 lbs Today's date: 05/12/2021 Total lbs lost to date: 39 Total lbs lost since last in-office visit: 0  Interim History: Brittney Farmer has been going through a separation with her husband, and she has been very stressed. Her appetite has been down with this situation. She is ready to get back on a Category plan.  Subjective:   1. Polyphagia Brittney Farmer is on Ozempic 2 mg, but she hasn't taken it in 3-4 weeks due to feeling sick with her family situation.  2. Other hyperlipidemia Brittney Farmer is not on medications, and she has been walking more often.  3. Vitamin D deficiency Brittney Farmer is on 4,000 units of Vit D daily.  4. At risk for heart disease Brittney Farmer is at a higher than average risk for cardiovascular disease due to obesity.   Assessment/Plan:   1. Polyphagia Intensive lifestyle modifications are the first line treatment for this issue. We discussed several lifestyle modifications today. We will check labs today. Brittney Farmer agreed to restart Ozempic 1 mg weekly with no refills (start with 37 clicks = 0.5 mg for the first 1-2 weeks, then increase to 1 mg dose). She will continue to work on diet, exercise and weight loss efforts. Orders and follow up as documented in patient record.  Counseling Polyphagia is excessive hunger. Causes can include: low blood sugars, hypERthyroidism, PMS, lack of sleep, stress, insulin resistance, diabetes, certain medications, and diets that  are deficient in protein and fiber.   - Comprehensive metabolic panel - Semaglutide, 1 MG/DOSE, 4 MG/3ML SOPN; Inject 1 mg as directed once a week.  Dispense: 3 mL; Refill: 0  2. Other hyperlipidemia Cardiovascular risk and specific lipid/LDL goals reviewed.  We discussed several lifestyle modifications today. We will check labs today. Brittney Farmer will continue with diet, exercise and weight loss efforts. Orders and follow up as documented in patient record.   Counseling Intensive lifestyle modifications are the first line treatment for this issue. Dietary changes: Increase soluble fiber. Decrease simple carbohydrates. Exercise changes: Moderate to vigorous-intensity aerobic activity 150 minutes per week if tolerated. Lipid-lowering medications: see documented in medical record.  - Lipid panel  3. Vitamin D deficiency Low Vitamin D level contributes to fatigue and are associated with obesity, breast, and colon cancer. We will check labs today, and Brittney Farmer will continue Vitamin D 4,000 units daily and will follow-up for routine testing of Vitamin D, at least 2-3 times per year to avoid over-replacement.  - VITAMIN D 25 Hydroxy (Vit-D Deficiency, Fractures)  4. At risk for heart disease Brittney Farmer was given approximately 15 minutes of coronary artery disease prevention counseling today. She is 36 y.o. female and has risk factors for heart disease including obesity. We discussed intensive lifestyle modifications today with an emphasis on specific weight loss instructions and strategies.   Repetitive spaced learning was employed today to elicit superior memory formation and behavioral change.  5. Obesity with current BMI of 34.73 Brittney Farmer is currently in the action stage of change. As such, her goal is to  continue with weight loss efforts. She has agreed to change to the Category 2 Plan.   Exercise goals: As is.  Behavioral modification strategies: meal planning and cooking strategies and keeping  healthy foods in the home.  Brittney Farmer has agreed to follow-up with our clinic in 3 weeks. She was informed of the importance of frequent follow-up visits to maximize her success with intensive lifestyle modifications for her multiple health conditions.   Brittney Farmer was informed we would discuss her lab results at her next visit unless there is a critical issue that needs to be addressed sooner. Brittney Farmer agreed to keep her next visit at the agreed upon time to discuss these results.  Objective:   Blood pressure 115/73, pulse 87, temperature 97.9 F (36.6 C), temperature source Oral, height 5\' 3"  (1.6 m), weight 196 lb (88.9 kg), SpO2 98 %. Body mass index is 34.72 kg/m.  General: Cooperative, alert, well developed, in no acute distress. HEENT: Conjunctivae and lids unremarkable. Cardiovascular: Regular rhythm.  Lungs: Normal work of breathing. Neurologic: No focal deficits.   Lab Results  Component Value Date   CREATININE 0.76 09/01/2020   BUN 11 09/01/2020   NA 143 09/01/2020   K 4.6 09/01/2020   CL 107 (H) 09/01/2020   CO2 22 09/01/2020   Lab Results  Component Value Date   ALT 11 09/01/2020   AST 11 09/01/2020   ALKPHOS 114 09/01/2020   BILITOT 0.4 09/01/2020   Lab Results  Component Value Date   HGBA1C 5.1 02/20/2020   Lab Results  Component Value Date   INSULIN 5.2 02/20/2020   Lab Results  Component Value Date   TSH 1.010 02/20/2020   Lab Results  Component Value Date   CHOL 199 09/01/2020   HDL 40 09/01/2020   LDLCALC 142 (H) 09/01/2020   TRIG 96 09/01/2020   CHOLHDL 5.0 (H) 09/01/2020   Lab Results  Component Value Date   VD25OH 69.1 09/01/2020   VD25OH 30.0 02/20/2020   VD25OH 33.9 08/04/2014   Lab Results  Component Value Date   WBC 7.5 07/01/2020   HGB 14.1 07/01/2020   HCT 43.2 07/01/2020   MCV 87 07/01/2020   PLT 353 07/01/2020   Lab Results  Component Value Date   IRON 38 02/20/2020   TIBC 284 02/20/2020   FERRITIN 103 02/20/2020    Attestation Statements:   Reviewed by clinician on day of visit: allergies, medications, problem list, medical history, surgical history, family history, social history, and previous encounter notes.   Wilhemena Durie, am acting as transcriptionist for [employer], PA-C.  I have reviewed the above documentation for accuracy and completeness, and I agree with the above. Abby Potash, PA-C

## 2021-05-13 LAB — LIPID PANEL
Chol/HDL Ratio: 4.4 ratio (ref 0.0–4.4)
Cholesterol, Total: 199 mg/dL (ref 100–199)
HDL: 45 mg/dL (ref >39)
LDL Chol Calc (NIH): 142 mg/dL — ABNORMAL HIGH (ref 0–99)
Triglycerides: 65 mg/dL (ref 0–149)
VLDL Cholesterol Cal: 12 mg/dL (ref 5–40)

## 2021-05-13 LAB — COMPREHENSIVE METABOLIC PANEL
ALT: 8 IU/L (ref 0–32)
AST: 7 IU/L (ref 0–40)
Albumin/Globulin Ratio: 1.7 (ref 1.2–2.2)
Albumin: 4.3 g/dL (ref 3.8–4.8)
Alkaline Phosphatase: 90 IU/L (ref 44–121)
BUN/Creatinine Ratio: 16 (ref 9–23)
BUN: 12 mg/dL (ref 6–20)
Bilirubin Total: 0.5 mg/dL (ref 0.0–1.2)
CO2: 23 mmol/L (ref 20–29)
Calcium: 9.2 mg/dL (ref 8.7–10.2)
Chloride: 105 mmol/L (ref 96–106)
Creatinine, Ser: 0.75 mg/dL (ref 0.57–1.00)
Globulin, Total: 2.6 g/dL (ref 1.5–4.5)
Glucose: 81 mg/dL (ref 70–99)
Potassium: 4.7 mmol/L (ref 3.5–5.2)
Sodium: 142 mmol/L (ref 134–144)
Total Protein: 6.9 g/dL (ref 6.0–8.5)
eGFR: 108 mL/min/{1.73_m2} (ref >59)

## 2021-05-13 LAB — VITAMIN D 25 HYDROXY (VIT D DEFICIENCY, FRACTURES): Vit D, 25-Hydroxy: 40.6 ng/mL (ref 30.0–100.0)

## 2021-05-14 ENCOUNTER — Other Ambulatory Visit (INDEPENDENT_AMBULATORY_CARE_PROVIDER_SITE_OTHER): Payer: Self-pay | Admitting: Family Medicine

## 2021-05-14 DIAGNOSIS — R632 Polyphagia: Secondary | ICD-10-CM

## 2021-05-16 ENCOUNTER — Other Ambulatory Visit: Payer: Self-pay | Admitting: Family

## 2021-05-18 ENCOUNTER — Other Ambulatory Visit (INDEPENDENT_AMBULATORY_CARE_PROVIDER_SITE_OTHER): Payer: Self-pay | Admitting: Physician Assistant

## 2021-05-18 DIAGNOSIS — R632 Polyphagia: Secondary | ICD-10-CM

## 2021-05-18 NOTE — Telephone Encounter (Signed)
PA has been submitted for Ozempic via cover my meds.

## 2021-05-20 ENCOUNTER — Other Ambulatory Visit: Payer: Self-pay | Admitting: Family

## 2021-05-20 DIAGNOSIS — K219 Gastro-esophageal reflux disease without esophagitis: Secondary | ICD-10-CM

## 2021-05-21 ENCOUNTER — Other Ambulatory Visit: Payer: Self-pay | Admitting: Family

## 2021-05-21 DIAGNOSIS — G43109 Migraine with aura, not intractable, without status migrainosus: Secondary | ICD-10-CM

## 2021-05-24 ENCOUNTER — Encounter: Payer: Self-pay | Admitting: Internal Medicine

## 2021-05-24 ENCOUNTER — Ambulatory Visit: Payer: Self-pay | Admitting: Gastroenterology

## 2021-05-24 NOTE — Telephone Encounter (Signed)
PA Case: [id number],  Status: Denied,  Denial Rationale: Coverage for Ozempic is denied. It does not meet the plan criteria for your diagnosis. The information provided by your prescriber does not meet [employer]'s guideline. The guideline used is: GLP-1 (glucagon-like peptide-1) Agonists Step Therapy with Quantity Limit. Information provided does not show that the policy requirements have been met. The requirement(s) not met are: Your plan requires a diagnosis of type 2 diabetes. The information submitted does not give this required diagnosis. In addition, other requirements not met are: Your medication history includes use of at one or more of the following: an agent containing metformin or insulin within the past 90 days OR You have an intolerance or hypersensitivity to ONE of the following agents: metformin or insulin OR You have an FDA labeled contraindication to ALL of the following agents: metformin AND insulin OR You have a diagnosis of type 2 diabetes with or at high risk for atherosclerotic cardiovascular disease, heart failure, and/or chronic kidney disease OR Information has been provided that indicates you are currently being treated with the requested GLP-1 within the past 90 days OR The prescriber states you are currently being treated with the requested GLP-1 within the past 90 days AND is at risk if therapy is changed. Also of note: Your plan does not cover any treatments for weight loss. Therefore, coverage for Ozempic is denied. Questions? Contact [phone].

## 2021-05-26 ENCOUNTER — Other Ambulatory Visit (INDEPENDENT_AMBULATORY_CARE_PROVIDER_SITE_OTHER): Payer: Self-pay | Admitting: Family Medicine

## 2021-05-26 DIAGNOSIS — R632 Polyphagia: Secondary | ICD-10-CM

## 2021-06-02 ENCOUNTER — Encounter (INDEPENDENT_AMBULATORY_CARE_PROVIDER_SITE_OTHER): Payer: Self-pay | Admitting: Physician Assistant

## 2021-06-02 ENCOUNTER — Ambulatory Visit (INDEPENDENT_AMBULATORY_CARE_PROVIDER_SITE_OTHER): Payer: 59 | Admitting: Physician Assistant

## 2021-06-02 ENCOUNTER — Other Ambulatory Visit (INDEPENDENT_AMBULATORY_CARE_PROVIDER_SITE_OTHER): Payer: Self-pay | Admitting: Physician Assistant

## 2021-06-02 ENCOUNTER — Other Ambulatory Visit: Payer: Self-pay

## 2021-06-02 VITALS — BP 102/68 | HR 81 | Temp 97.7°F | Ht 63.0 in | Wt 190.0 lb

## 2021-06-02 DIAGNOSIS — E669 Obesity, unspecified: Secondary | ICD-10-CM | POA: Diagnosis not present

## 2021-06-02 DIAGNOSIS — Z9189 Other specified personal risk factors, not elsewhere classified: Secondary | ICD-10-CM

## 2021-06-02 DIAGNOSIS — Z6833 Body mass index (BMI) 33.0-33.9, adult: Secondary | ICD-10-CM | POA: Diagnosis not present

## 2021-06-02 DIAGNOSIS — R632 Polyphagia: Secondary | ICD-10-CM | POA: Diagnosis not present

## 2021-06-02 DIAGNOSIS — E559 Vitamin D deficiency, unspecified: Secondary | ICD-10-CM | POA: Diagnosis not present

## 2021-06-02 MED ORDER — VITAMIN D (ERGOCALCIFEROL) 1.25 MG (50000 UNIT) PO CAPS: 50000.0000 [IU] | ORAL_CAPSULE | ORAL | 0 refills | Status: DC

## 2021-06-02 MED ORDER — SEMAGLUTIDE (2 MG/DOSE) 8 MG/3ML ~~LOC~~ SOPN
2.0000 mg | PEN_INJECTOR | SUBCUTANEOUS | 0 refills | Status: DC
Start: 2021-06-02 — End: 2021-08-17

## 2021-06-02 NOTE — Progress Notes (Signed)
Chief Complaint:   OBESITY Brittney Farmer is here to discuss her progress with her obesity treatment plan along with follow-up of her obesity related diagnoses. Brittney Farmer is on the Category 2 Plan and states she is following her eating plan approximately 75% of the time. Brittney Farmer states she is walking for 20 minutes 3-4 times per week.  Today's visit was #: 62 Starting weight: 235 lbs Starting date: 02/20/2020 Today's weight: 190 lbs Today's date: 06/02/2021 Total lbs lost to date: 45 Total lbs lost since last in-office visit: 6  Interim History: Brittney Farmer did well with weight loss. She is eating only 2 meals a day some days due to being busy at work. Her energy is good most days. She is traveling to [place] next week.  Subjective:   1. Vitamin D deficiency Brittney Farmer denies nausea, vomiting, or muscle weakness on Vit D OTC. She is not taking it daily as she forgets. I discussed labs with the patient today.  2. Polyphagia Brittney Farmer's appetite is controlled on Ozempic 2 mg. She rarely feels hungry but she eats 2 meals a day.  3. At risk for impaired metabolic function Brittney Farmer is at increased risk for impaired metabolic function due to skipping meals.  Assessment/Plan:   1. Vitamin D deficiency Low Vitamin D level contributes to fatigue and are associated with obesity, breast, and colon cancer. Brittney Farmer agreed to start prescription Vitamin D 50,000 IU every week with no refills. She will follow-up for routine testing of Vitamin D, at least 2-3 times per year to avoid over-replacement.  - Vitamin D, Ergocalciferol, (DRISDOL) 1.25 MG (50000 UNIT) CAPS capsule; Take 1 capsule (50,000 Units total) by mouth every 7 (seven) days.  Dispense: 4 capsule; Refill: 0  2. Polyphagia Intensive lifestyle modifications are the first line treatment for this issue. We discussed several lifestyle modifications today. We will refill Ozempic 2 mg for 1 month. Brittney Farmer will continue to work on diet, exercise and  weight loss efforts. Orders and follow up as documented in patient record.  Counseling Polyphagia is excessive hunger. Causes can include: low blood sugars, hypERthyroidism, PMS, lack of sleep, stress, insulin resistance, diabetes, certain medications, and diets that are deficient in protein and fiber.   - Semaglutide, 2 MG/DOSE, 8 MG/3ML SOPN; Inject 2 mg as directed once a week.  Dispense: 3 mL; Refill: 0  3. At risk for impaired metabolic function Brittney Farmer was given approximately 15 minutes of impaired  metabolic function prevention counseling today. We discussed intensive lifestyle modifications today with an emphasis on specific nutrition and exercise instructions and strategies.   Repetitive spaced learning was employed today to elicit superior memory formation and behavioral change.  4. Obesity with current BMI of 33.67 Brittney Farmer is currently in the action stage of change. As such, her goal is to continue with weight loss efforts. She has agreed to the Category 2 Plan. Add a protein shake instead of skipping a meal.  Exercise goals: As is.  Behavioral modification strategies: increasing lean protein intake, no skipping meals, and meal planning and cooking strategies.  Brittney Farmer has agreed to follow-up with our clinic in 3 weeks. She was informed of the importance of frequent follow-up visits to maximize her success with intensive lifestyle modifications for her multiple health conditions.   Objective:   Blood pressure 102/68, pulse 81, temperature 97.7 F (36.5 C), height 5\' 3"  (1.6 m), weight 190 lb (86.2 kg), SpO2 100 %. Body mass index is 33.66 kg/m.  General: Cooperative, alert, well developed, in  no acute distress. HEENT: Conjunctivae and lids unremarkable. Cardiovascular: Regular rhythm.  Lungs: Normal work of breathing. Neurologic: No focal deficits.   Lab Results  Component Value Date   CREATININE 0.75 05/12/2021   BUN 12 05/12/2021   NA 142 05/12/2021   K 4.7  05/12/2021   CL 105 05/12/2021   CO2 23 05/12/2021   Lab Results  Component Value Date   ALT 8 05/12/2021   AST 7 05/12/2021   ALKPHOS 90 05/12/2021   BILITOT 0.5 05/12/2021   Lab Results  Component Value Date   HGBA1C 5.1 02/20/2020   Lab Results  Component Value Date   INSULIN 5.2 02/20/2020   Lab Results  Component Value Date   TSH 1.010 02/20/2020   Lab Results  Component Value Date   CHOL 199 05/12/2021   HDL 45 05/12/2021   LDLCALC 142 (H) 05/12/2021   TRIG 65 05/12/2021   CHOLHDL 4.4 05/12/2021   Lab Results  Component Value Date   VD25OH 40.6 05/12/2021   VD25OH 69.1 09/01/2020   VD25OH 30.0 02/20/2020   Lab Results  Component Value Date   WBC 7.5 07/01/2020   HGB 14.1 07/01/2020   HCT 43.2 07/01/2020   MCV 87 07/01/2020   PLT 353 07/01/2020   Lab Results  Component Value Date   IRON 38 02/20/2020   TIBC 284 02/20/2020   FERRITIN 103 02/20/2020   Attestation Statements:   Reviewed by clinician on day of visit: allergies, medications, problem list, medical history, surgical history, family history, social history, and previous encounter notes.   Wilhemena Durie, am acting as transcriptionist for [employer], PA-C.  I have reviewed the above documentation for accuracy and completeness, and I agree with the above. Abby Potash, PA-C

## 2021-06-02 NOTE — Telephone Encounter (Signed)
She said that the pharmacy told her it was dose dependent on what they would approve. Call her and let her know they denied it please and have her check with her insurance company to see what they may cover. Thank you!

## 2021-06-16 ENCOUNTER — Other Ambulatory Visit: Payer: Self-pay | Admitting: Family

## 2021-06-16 DIAGNOSIS — F339 Major depressive disorder, recurrent, unspecified: Secondary | ICD-10-CM

## 2021-06-16 DIAGNOSIS — F411 Generalized anxiety disorder: Secondary | ICD-10-CM

## 2021-06-17 ENCOUNTER — Other Ambulatory Visit: Payer: Self-pay | Admitting: Family

## 2021-06-17 DIAGNOSIS — F411 Generalized anxiety disorder: Secondary | ICD-10-CM

## 2021-06-17 DIAGNOSIS — F339 Major depressive disorder, recurrent, unspecified: Secondary | ICD-10-CM

## 2021-07-06 ENCOUNTER — Other Ambulatory Visit: Payer: Self-pay

## 2021-07-06 ENCOUNTER — Telehealth (INDEPENDENT_AMBULATORY_CARE_PROVIDER_SITE_OTHER): Payer: 59 | Admitting: Physician Assistant

## 2021-07-06 ENCOUNTER — Encounter (INDEPENDENT_AMBULATORY_CARE_PROVIDER_SITE_OTHER): Payer: Self-pay | Admitting: Physician Assistant

## 2021-07-06 DIAGNOSIS — Z6833 Body mass index (BMI) 33.0-33.9, adult: Secondary | ICD-10-CM

## 2021-07-06 DIAGNOSIS — E559 Vitamin D deficiency, unspecified: Secondary | ICD-10-CM

## 2021-07-06 DIAGNOSIS — E669 Obesity, unspecified: Secondary | ICD-10-CM

## 2021-07-06 MED ORDER — VITAMIN D (ERGOCALCIFEROL) 1.25 MG (50000 UNIT) PO CAPS: 50000.0000 [IU] | ORAL_CAPSULE | ORAL | 0 refills | Status: DC

## 2021-07-07 NOTE — Progress Notes (Signed)
? ? ?TeleHealth Visit:  ?Due to the COVID-19 pandemic, this visit was completed with telemedicine (audio/video) technology to reduce patient and provider exposure as well as to preserve personal protective equipment.  ? ?Brittney Farmer has verbally consented to this TeleHealth visit. The patient is located at home, the provider is located at the [facility] office. The participants in this visit include the listed provider and patient. The visit was conducted today via video. ? ?Chief Complaint: OBESITY ?Brittney Farmer is here to discuss her progress with her obesity treatment plan along with follow-up of her obesity related diagnoses. Brittney Farmer is on the Category 2 Plan plus protein shake and states she is following her eating plan approximately 50-75% of the time. Brittney Farmer states she is walking for 30 minutes 3-4 times per week. ? ?Today's visit was #: 20 ?Starting weight: 235 lbs ?Starting date: 02/20/2020 ? ?Interim History: Brittney Farmer is at home today due to a gastrointestinal virus. She has been stressed due to a fight with her husband. She hasn't been eating much. She states that she has gained weight. On average she is skipping 1-2 meals daily.  ? ?Subjective:  ? ?1. Vitamin D deficiency ?Brittney Farmer has not been taking Vitamin D regularly and her levels have decreased.  ? ?Assessment/Plan:  ? ?1. Vitamin D deficiency ?Low Vitamin D level contributes to fatigue and are associated with obesity, breast, and colon cancer. We will refill prescription Vitamin D 50,000 IU every week for 1 month with no refills and Brittney Farmer will follow-up for routine testing of Vitamin D, at least 2-3 times per year to avoid over-replacement. ? ?- Vitamin D, Ergocalciferol, (DRISDOL) 1.25 MG (50000 UNIT) CAPS capsule; Take 1 capsule (50,000 Units total) by mouth every 7 (seven) days.  Dispense: 4 capsule; Refill: 0 ? ?2. Obesity with current BMI of 33.67 ?Brittney Farmer is currently in the action stage of change. As such, her goal is to continue  with weight loss efforts. She has agreed to the Category 1 Plan.  ? ?Exercise goals:  As is. ? ?Behavioral modification strategies: increasing lean protein intake and no skipping meals. ? ?Brittney Farmer has agreed to follow-up with our clinic in 3 weeks. She was informed of the importance of frequent follow-up visits to maximize her success with intensive lifestyle modifications for her multiple health conditions. ? ?Objective:  ? ?VITALS: Per patient if applicable, see vitals. ?GENERAL: Alert and in no acute distress. ?CARDIOPULMONARY: No increased WOB. Speaking in clear sentences.  ?PSYCH: Pleasant and cooperative. Speech normal rate and rhythm. Affect is appropriate. Insight and judgement are appropriate. Attention is focused, linear, and appropriate.  ?NEURO: Oriented as arrived to appointment on time with no prompting.  ? ?Lab Results  ?Component Value Date  ? CREATININE 0.75 05/12/2021  ? BUN 12 05/12/2021  ? NA 142 05/12/2021  ? K 4.7 05/12/2021  ? CL 105 05/12/2021  ? CO2 23 05/12/2021  ? ?Lab Results  ?Component Value Date  ? ALT 8 05/12/2021  ? AST 7 05/12/2021  ? ALKPHOS 90 05/12/2021  ? BILITOT 0.5 05/12/2021  ? ?Lab Results  ?Component Value Date  ? HGBA1C 5.1 02/20/2020  ? ?Lab Results  ?Component Value Date  ? INSULIN 5.2 02/20/2020  ? ?Lab Results  ?Component Value Date  ? TSH 1.010 02/20/2020  ? ?Lab Results  ?Component Value Date  ? CHOL 199 05/12/2021  ? HDL 45 05/12/2021  ? LDLCALC 142 (H) 05/12/2021  ? TRIG 65 05/12/2021  ? CHOLHDL 4.4 05/12/2021  ? ?Lab  Results  ?Component Value Date  ? VD25OH 40.6 05/12/2021  ? VD25OH 69.1 09/01/2020  ? VD25OH 30.0 02/20/2020  ? ?Lab Results  ?Component Value Date  ? WBC 7.5 07/01/2020  ? HGB 14.1 07/01/2020  ? HCT 43.2 07/01/2020  ? MCV 87 07/01/2020  ? PLT 353 07/01/2020  ? ?Lab Results  ?Component Value Date  ? IRON 38 02/20/2020  ? TIBC 284 02/20/2020  ? FERRITIN 103 02/20/2020  ? ? ?Attestation Statements:  ? ?Reviewed by clinician on day of visit: allergies,  medications, problem list, medical history, surgical history, family history, social history, and previous encounter notes. ? ?I, Tonye Pearson, am acting as Location manager for [employer], PA-C. ? ?I have reviewed the above documentation for accuracy and completeness, and I agree with the above. Abby Potash, PA-C ?  ?

## 2021-08-05 ENCOUNTER — Other Ambulatory Visit (INDEPENDENT_AMBULATORY_CARE_PROVIDER_SITE_OTHER): Payer: Self-pay | Admitting: Physician Assistant

## 2021-08-05 DIAGNOSIS — E559 Vitamin D deficiency, unspecified: Secondary | ICD-10-CM

## 2021-08-07 ENCOUNTER — Other Ambulatory Visit: Payer: Self-pay | Admitting: Family

## 2021-08-07 DIAGNOSIS — F411 Generalized anxiety disorder: Secondary | ICD-10-CM

## 2021-08-07 DIAGNOSIS — F339 Major depressive disorder, recurrent, unspecified: Secondary | ICD-10-CM

## 2021-08-12 ENCOUNTER — Other Ambulatory Visit: Payer: Self-pay | Admitting: Family

## 2021-08-17 ENCOUNTER — Ambulatory Visit (INDEPENDENT_AMBULATORY_CARE_PROVIDER_SITE_OTHER): Payer: 59 | Admitting: Family

## 2021-08-17 ENCOUNTER — Telehealth: Payer: Self-pay | Admitting: *Deleted

## 2021-08-17 ENCOUNTER — Encounter: Payer: Self-pay | Admitting: Family

## 2021-08-17 VITALS — BP 120/82 | HR 78 | Temp 98.6°F | Ht 63.0 in | Wt 215.8 lb

## 2021-08-17 DIAGNOSIS — R1010 Upper abdominal pain, unspecified: Secondary | ICD-10-CM

## 2021-08-17 DIAGNOSIS — E7849 Other hyperlipidemia: Secondary | ICD-10-CM | POA: Diagnosis not present

## 2021-08-17 DIAGNOSIS — F39 Unspecified mood [affective] disorder: Secondary | ICD-10-CM

## 2021-08-17 DIAGNOSIS — F411 Generalized anxiety disorder: Secondary | ICD-10-CM

## 2021-08-17 DIAGNOSIS — G43109 Migraine with aura, not intractable, without status migrainosus: Secondary | ICD-10-CM | POA: Diagnosis not present

## 2021-08-17 DIAGNOSIS — K429 Umbilical hernia without obstruction or gangrene: Secondary | ICD-10-CM

## 2021-08-17 DIAGNOSIS — Z713 Dietary counseling and surveillance: Secondary | ICD-10-CM | POA: Diagnosis not present

## 2021-08-17 DIAGNOSIS — K219 Gastro-esophageal reflux disease without esophagitis: Secondary | ICD-10-CM

## 2021-08-17 DIAGNOSIS — R112 Nausea with vomiting, unspecified: Secondary | ICD-10-CM | POA: Diagnosis not present

## 2021-08-17 DIAGNOSIS — R69 Illness, unspecified: Secondary | ICD-10-CM | POA: Diagnosis not present

## 2021-08-17 DIAGNOSIS — F339 Major depressive disorder, recurrent, unspecified: Secondary | ICD-10-CM

## 2021-08-17 MED ORDER — OMEPRAZOLE 40 MG PO CPDR: 40.0000 mg | DELAYED_RELEASE_CAPSULE | Freq: Every day | ORAL | 0 refills | Status: DC

## 2021-08-17 MED ORDER — LIRAGLUTIDE 18 MG/3ML ~~LOC~~ SOPN: PEN_INJECTOR | SUBCUTANEOUS | 0 refills | Status: DC

## 2021-08-17 MED ORDER — BUPROPION HCL ER (XL) 150 MG PO TB24: 150.0000 mg | ORAL_TABLET | Freq: Every day | ORAL | 1 refills | Status: DC

## 2021-08-17 MED ORDER — BUSPIRONE HCL 5 MG PO TABS: 5.0000 mg | ORAL_TABLET | Freq: Three times a day (TID) | ORAL | 0 refills | Status: DC

## 2021-08-17 MED ORDER — ONDANSETRON HCL 4 MG PO TABS: 4.0000 mg | ORAL_TABLET | Freq: Three times a day (TID) | ORAL | 2 refills | Status: DC | PRN

## 2021-08-17 NOTE — Patient Instructions (Signed)

## 2021-08-17 NOTE — Progress Notes (Signed)
? ?Subjective:  ? ? Patient ID: Brittney Farmer, female    DOB: Sep 29, 1985, 36 y.o.   MRN: [id number] ? ?Chief Complaint  ?Patient presents with  ? Medical Management of Chronic Issues  ?  Came off antidepressant   ? Hernia  ? Weight Loss  ? ?Pt presents to the office today for chronic follow up. She reports she stopped her Wellbutrin and Lexapro a few months ago. She is currently going through a divorce and continues to have increased anxiety.  ? ?She was going to healthy weight loss clinic, but stopped because of cost. She is morbid obese with a BMI of 38 with depression, GAD, and hyperlipidemia.  ? ?She is also complaining of a bulging around her umbilical area that she noticed awhile ago. Denies any pain.  ?Anxiety ?Presents for follow-up visit. Symptoms include excessive worry, insomnia, irritability, nervous/anxious behavior and restlessness. Symptoms occur occasionally. The severity of symptoms is moderate.  ? ? ?Depression ?       This is a chronic problem.  The current episode started more than 1 year ago.   The onset quality is gradual.   The problem occurs intermittently.  Associated symptoms include insomnia, restlessness and sad.  Associated symptoms include no helplessness, no hopelessness and not irritable.  Past treatments include nothing.  Past medical history includes anxiety.   ?Gastroesophageal Reflux ?She complains of belching and heartburn. This is a chronic problem. The current episode started more than 1 year ago. The problem occurs occasionally. The problem has been waxing and waning. Risk factors include obesity. She has tried a PPI for the symptoms. The treatment provided moderate relief.  ?Migraine  ?This is a chronic problem. The current episode started more than 1 year ago. The problem occurs intermittently. The problem has been waxing and waning. Associated symptoms include insomnia. She has tried beta blockers for the symptoms. The treatment provided moderate relief. Her past medical  history is significant for obesity.  ?Hyperlipidemia ?This is a chronic problem. The current episode started more than 1 year ago. The problem is controlled. Exacerbating diseases include obesity. Current antihyperlipidemic treatment includes diet change. The current treatment provides mild improvement of lipids. Risk factors for coronary artery disease include dyslipidemia and a sedentary lifestyle.  ? ? ? ?Review of Systems  ?Constitutional:  Positive for irritability.  ?Gastrointestinal:  Positive for heartburn.  ?Psychiatric/Behavioral:  Positive for depression. The patient is nervous/anxious and has insomnia.   ?All other systems reviewed and are negative. ? ?   ?Objective:  ? Physical Exam ?Vitals reviewed.  ?Constitutional:   ?   General: She is not irritable.She is not in acute distress. ?   Appearance: She is well-developed. She is obese.  ?HENT:  ?   Head: Normocephalic and atraumatic.  ?   Right Ear: Tympanic membrane normal.  ?   Left Ear: Tympanic membrane normal.  ?Eyes:  ?   Pupils: Pupils are equal, round, and reactive to light.  ?Neck:  ?   Thyroid: No thyromegaly.  ?Cardiovascular:  ?   Rate and Rhythm: Normal rate and regular rhythm.  ?   Heart sounds: Normal heart sounds. No murmur heard. ?Pulmonary:  ?   Effort: Pulmonary effort is normal. No respiratory distress.  ?   Breath sounds: Normal breath sounds. No wheezing.  ?Abdominal:  ?   General: Bowel sounds are normal. There is no distension.  ?   Palpations: Abdomen is soft.  ?   Tenderness: There is no abdominal tenderness.  ?  Hernia: A hernia is present. Hernia is present in the umbilical area.  ?Musculoskeletal:     ?   General: No tenderness. Normal range of motion.  ?   Cervical back: Normal range of motion and neck supple.  ?Skin: ?   General: Skin is warm and dry.  ?Neurological:  ?   Mental Status: She is alert and oriented to person, place, and time.  ?   Cranial Nerves: No cranial nerve deficit.  ?   Deep Tendon Reflexes: Reflexes  are normal and symmetric.  ?Psychiatric:     ?   Behavior: Behavior normal.     ?   Thought Content: Thought content normal.     ?   Judgment: Judgment normal.  ? ? ? ? ?BP 120/82   Pulse 78   Temp 98.6 ?F (37 ?C) (Temporal)   Ht '5\' 3"'$  (1.6 m)   Wt 215 lb 12.8 oz (97.9 kg)   BMI 38.23 kg/m?  ? ?   ?Assessment & Plan:  ?Brittney Farmer comes in today with chief complaint of Medical Management of Chronic Issues (Came off antidepressant ), Hernia, and Weight Loss ? ? ?Diagnosis and orders addressed: ? ?1. Gastroesophageal reflux disease without esophagitis ?- omeprazole (PRILOSEC) 40 MG capsule; Take 1 capsule (40 mg total) by mouth daily.  Dispense: 90 capsule; Refill: 0 ? ?2. Pain of upper abdomen ?- ondansetron (ZOFRAN) 4 MG tablet; Take 1 tablet (4 mg total) by mouth every 8 (eight) hours as needed for nausea or vomiting. TAKE 1 TABLET BY MOUTH EVERY 8 HOURS AS NEEDED FOR NAUSEA AND VOMITING  Dispense: 90 tablet; Refill: 2 ? ?3. Nausea and vomiting ?- ondansetron (ZOFRAN) 4 MG tablet; Take 1 tablet (4 mg total) by mouth every 8 (eight) hours as needed for nausea or vomiting. TAKE 1 TABLET BY MOUTH EVERY 8 HOURS AS NEEDED FOR NAUSEA AND VOMITING  Dispense: 90 tablet; Refill: 2 ? ?4. Recurrent major depressive disorder, remission status unspecified ([place]) ?Will restart Wellbutrin 150 mg and Buspar 5 mg TID prn ?- busPIRone (BUSPAR) 5 MG tablet; Take 1 tablet (5 mg total) by mouth 3 (three) times daily. Patients insurance has changed and she will update you all with new insurance  Dispense: 270 tablet; Refill: 0 ?- buPROPion (WELLBUTRIN XL) 150 MG 24 hr tablet; Take 1 tablet (150 mg total) by mouth daily.  Dispense: 90 tablet; Refill: 1 ? ?5. GAD (generalized anxiety disorder) ?- busPIRone (BUSPAR) 5 MG tablet; Take 1 tablet (5 mg total) by mouth 3 (three) times daily. Patients insurance has changed and she will update you all with new insurance  Dispense: 270 tablet; Refill: 0 ?- buPROPion (WELLBUTRIN XL) 150 MG  24 hr tablet; Take 1 tablet (150 mg total) by mouth daily.  Dispense: 90 tablet; Refill: 1 ? ?6. Migraine with aura and without status migrainosus, not intractable ? ?7. Mood disorder with emotional eating ?- liraglutide (VICTOZA) 18 MG/3ML SOPN; Inject 0.6 mg into the skin daily for 7 days, THEN 1.2 mg daily for 7 days, THEN 1.8 mg daily for 7 days, THEN 1.8 mg daily for 14 days.  Dispense: 8.4 mL; Refill: 0 ? ?8. Morbid obesity ([place]) ?Will start Victoza today. Start at 0.6 mg and tamper.  ?Encourage healthy diet and exercise  ?- liraglutide (VICTOZA) 18 MG/3ML SOPN; Inject 0.6 mg into the skin daily for 7 days, THEN 1.2 mg daily for 7 days, THEN 1.8 mg daily for 7 days, THEN 1.8 mg daily for 14  days.  Dispense: 8.4 mL; Refill: 0 ? ?9. Other hyperlipidemia ?- liraglutide (VICTOZA) 18 MG/3ML SOPN; Inject 0.6 mg into the skin daily for 7 days, THEN 1.2 mg daily for 7 days, THEN 1.8 mg daily for 7 days, THEN 1.8 mg daily for 14 days.  Dispense: 8.4 mL; Refill: 0 ? ?10. Weight loss counseling, encounter for ?- liraglutide (VICTOZA) 18 MG/3ML SOPN; Inject 0.6 mg into the skin daily for 7 days, THEN 1.2 mg daily for 7 days, THEN 1.8 mg daily for 7 days, THEN 1.8 mg daily for 14 days.  Dispense: 8.4 mL; Refill: 0 ? ?11. Umbilical hernia without obstruction and without gangrene ?- Ambulatory referral to General Surgery ? ? ?Labs pending ?Health Maintenance reviewed ?Diet and exercise encouraged ? ?Follow up plan: ?1 month to recheck GAD, Depression, and weight loss  ? ? ?Evelina Dun, FNP ? ? ? ?

## 2021-08-17 NOTE — Telephone Encounter (Signed)
(  Key: [id number]) ?Drug ?Victoza '18MG'$ /3ML pen-injectors ? ?Sent to Plan today ?

## 2021-08-18 ENCOUNTER — Other Ambulatory Visit: Payer: Self-pay | Admitting: Family

## 2021-08-18 DIAGNOSIS — E7849 Other hyperlipidemia: Secondary | ICD-10-CM

## 2021-08-18 DIAGNOSIS — Z713 Dietary counseling and surveillance: Secondary | ICD-10-CM

## 2021-08-18 DIAGNOSIS — F39 Unspecified mood [affective] disorder: Secondary | ICD-10-CM

## 2021-08-18 NOTE — Telephone Encounter (Signed)
Denied on April 18 ?Your PA request has been denied. Additional information will be provided in the denial communication. (Message 1140 ? ?Information about coverage denials ?We based this coverage denial on the terms of your benefit plan. Your plan does not cover services that ?are not medically necessary or that are contractually excluded. For a full explanation of the coverage ?available, please review your Certificate of Coverage. The Pharmacy Benefit Exclusions section provides ?details. ?You can request a copy of the contract language by contacting [employer]. Their toll-free ?phone number is on your member ID card ?

## 2021-08-21 ENCOUNTER — Other Ambulatory Visit: Payer: Self-pay | Admitting: Family

## 2021-08-21 DIAGNOSIS — G43109 Migraine with aura, not intractable, without status migrainosus: Secondary | ICD-10-CM

## 2021-08-26 ENCOUNTER — Encounter: Payer: Self-pay | Admitting: Surgery

## 2021-08-26 ENCOUNTER — Ambulatory Visit: Payer: 59 | Admitting: Surgery

## 2021-08-26 ENCOUNTER — Other Ambulatory Visit: Payer: Self-pay

## 2021-08-26 VITALS — BP 122/79 | HR 79 | Temp 98.5°F | Resp 14 | Ht 63.0 in | Wt 215.0 lb

## 2021-08-26 DIAGNOSIS — R1900 Intra-abdominal and pelvic swelling, mass and lump, unspecified site: Secondary | ICD-10-CM | POA: Diagnosis not present

## 2021-08-27 NOTE — Progress Notes (Signed)
[facility] History and Physical ? ?Reason for Referral: Umbilical hernia ?Referring Physician: Evelina Dun, FNP ? ?Chief Complaint   ?New Patient (Initial Visit) ?  ? ? ?Brittney Farmer is a 36 y.o. female.  ?HPI: Patient presents for evaluation of possible hernia.  She has noted that she had a bulge below her umbilicus for the last 2 months.  It has increased in size over the last month.  She denies pain associated with it.  She does confirm episodes of nausea without vomiting, however she has chronic issues with nausea.  She is moving her bowels without issue.  She denies ever having this bulge gets stuck and not be reducible.  She has a past medical history significant for anxiety, depression, IBS, and GERD.  Her surgical history significant for laparoscopic tubal ligation.  She denies use of blood thinning medications.  She denies use of tobacco products, alcohol, and illicit drugs. ? ?Past Medical History:  ?Diagnosis Date  ? ADD (attention deficit disorder)   ? ADHD   ? Anemia   ? Anxiety   ? Back pain   ? Constipation   ? Depression   ? GERD (gastroesophageal reflux disease)   ? IBS (irritable bowel syndrome)   ? IBS (irritable bowel syndrome)   ? Palpitations   ? SOB (shortness of breath) on exertion   ? Spine curvature, acquired   ? Stomach ulcer   ? ? ?Past Surgical History:  ?Procedure Laterality Date  ? BIOPSY  07/27/2020  ? Procedure: BIOPSY;  Surgeon: Eloise Harman, DO;  [facility];  Service: Endoscopy;;  ? COLONOSCOPY WITH PROPOFOL N/A 07/27/2020  ? Procedure: COLONOSCOPY WITH PROPOFOL;  Surgeon: Eloise Harman, DO;  Location: [facility];  Service: Endoscopy;  Laterality: N/A;  PM  ? ESOPHAGOGASTRODUODENOSCOPY (EGD) WITH PROPOFOL N/A 07/27/2020  ? Procedure: ESOPHAGOGASTRODUODENOSCOPY (EGD) WITH PROPOFOL;  Surgeon: Eloise Harman, DO;  Location: [facility];  Service: Endoscopy;  Laterality: N/A;  ? HAND SURGERY Left 09/08/2016  ? [place]  ? TUBAL LIGATION   03/13/2019  ? VAGINAL DELIVERY  03/13/2019  ? ? ?Family History  ?Problem Relation Age of Onset  ? Diabetes Mother   ? Hyperlipidemia Mother   ? Hypertension Mother   ? Heart disease Mother   ? Depression Mother   ? Anxiety disorder Mother   ? Alcoholism Mother   ? Hyperlipidemia Father   ? Hypertension Father   ? Alcoholism Father   ? Celiac disease Neg Hx   ? Inflammatory bowel disease Neg Hx   ? Colon cancer Neg Hx   ? ? ?Social History  ? ?Tobacco Use  ? Smoking status: Never  ? Smokeless tobacco: Never  ?Vaping Use  ? Vaping Use: Never used  ?Substance Use Topics  ? Alcohol use: No  ? Drug use: No  ? ? ?Medications: I have reviewed the patient's current medications. ?Allergies as of 08/26/2021   ? ?   Reactions  ? Latex   ? Band aids - causes skin irritation    ? ?  ? ?  ?Medication List  ?  ? ?  ? Accurate as of August 26, 2021 11:59 PM. If you have any questions, ask your nurse or doctor.  ?  ?  ? ?  ? ?buPROPion 150 MG 24 hr tablet ?Commonly known as: Wellbutrin XL ?Take 1 tablet (150 mg total) by mouth daily. ?  ?busPIRone 5 MG tablet ?Commonly known as: BUSPAR ?Take 1  tablet (5 mg total) by mouth 3 (three) times daily. Patients insurance has changed and she will update you all with new insurance ?  ?dicyclomine 10 MG capsule ?Commonly known as: BENTYL ?TAKE 1 CAPSULE BY MOUTH 4 TIMES DAILY BEFORE MEALS AND AT BEDTIME FOR ABDOMINAL PAIN ?  ?fluticasone 50 MCG/ACT nasal spray ?Commonly known as: FLONASE ?Place 2 sprays into both nostrils daily. ?  ?liraglutide 18 MG/3ML Sopn ?Commonly known as: VICTOZA ?Inject 0.6 mg into the skin daily for 7 days, THEN 1.2 mg daily for 7 days, THEN 1.8 mg daily for 7 days, THEN 1.8 mg daily for 14 days. ?Start taking on: August 17, 2021 ?  ?loratadine 10 MG tablet ?Commonly known as: CLARITIN ?Take 1 tablet (10 mg total) by mouth daily. ?  ?omeprazole 40 MG capsule ?Commonly known as: PRILOSEC ?Take 1 capsule (40 mg total) by mouth daily. ?  ?ondansetron 4 MG tablet ?Commonly  known as: ZOFRAN ?Take 1 tablet (4 mg total) by mouth every 8 (eight) hours as needed for nausea or vomiting. TAKE 1 TABLET BY MOUTH EVERY 8 HOURS AS NEEDED FOR NAUSEA AND VOMITING ?  ?rizatriptan 10 MG tablet ?Commonly known as: MAXALT ?TAKE 1 TABLET BY MOUTH AS NEEDED FOR MIGRAINE. MAY REPEAT IN 2 HOURS IF NEEDED ?  ?topiramate 50 MG tablet ?Commonly known as: TOPAMAX ?TAKE 1 TABLET BY MOUTH TWICE A DAY ?  ?vitamin B-12 1000 MCG tablet ?Commonly known as: CYANOCOBALAMIN ?Take 1,000 mcg by mouth daily. ?  ?WOMENS MULTIVITAMIN PO ?Take 1 tablet by mouth daily. ?  ? ?  ? ? ? ?ROS:  ?Constitutional: negative for chills, fatigue, and fevers ?Eyes: positive for blurred vision, negative for pain ?Ears, nose, mouth, throat, and face: negative for ear drainage, sore throat, and sinus problems ?Respiratory: negative for cough, wheezing, and shortness of breath ?Cardiovascular: negative for chest pain and palpitations ?Gastrointestinal: positive for abdominal pain, nausea, and reflux symptoms ?Genitourinary:positive for frequency, negative for dysuria and urinary retention ?Integument/breast: negative for dryness and rash ?Hematologic/lymphatic: negative for bleeding and lymphadenopathy ?Musculoskeletal:positive for back pain, negative for neck pain ?Neurological: negative for dizziness, tremors, and numbness ?Endocrine: positive for temperature intolerance ? ?Blood pressure 122/79, pulse 79, temperature 98.5 ?F (36.9 ?C), temperature source Oral, resp. rate 14, height '5\' 3"'$  (1.6 m), weight 215 lb (97.5 kg), SpO2 98 %. ?Physical Exam ?Vitals reviewed.  ?Constitutional:   ?   Appearance: Normal appearance.  ?HENT:  ?   Head: Normocephalic and atraumatic.  ?Eyes:  ?   Extraocular Movements: Extraocular movements intact.  ?   Pupils: Pupils are equal, round, and reactive to light.  ?Cardiovascular:  ?   Rate and Rhythm: Normal rate and regular rhythm.  ?Pulmonary:  ?   Effort: Pulmonary effort is normal.  ?   Breath sounds:  Normal breath sounds.  ?Abdominal:  ?   Comments: Abdomen soft, nondistended, nontender to percussion and palpation; no rigidity, guarding, rebound tenderness; fullness with bulge when sitting up below the umbilicus, no definitive hernia defect able to be palpated  ?Musculoskeletal:     ?   General: Normal range of motion.  ?   Cervical back: Normal range of motion.  ?Skin: ?   General: Skin is warm and dry.  ?Neurological:  ?   General: No focal deficit present.  ?   Mental Status: She is alert and oriented to person, place, and time.  ?Psychiatric:     ?   Mood and Affect: Mood normal.     ?  Behavior: Behavior normal.  ? ? ?Results: ?No results found for this or any previous visit (from the past 48 hour(s)). ? ?No results found. ? ? ?Assessment & Plan:  ?Brittney Farmer is a 36 y.o. female who presents for evaluation of abdominal hernia. ? ?-While there is a bulge present when the patient sits up, I am unable to palpate a definitive defect.  This area is soft and nontender and is reduced when the patient is upright and supine. ?-Given the inability to palpate a defect, I will obtain a CT abdomen and pelvis with IV and oral contrast to evaluate for hernia defect, and contents of the hernia ?-I explained the pathophysiology of hernias to the patient and provided information to her in her after visit summary ?-Will discuss surgical options following CT abdomen and pelvis ?-Advised the patient to present to the emergency department if she begins to have nausea, vomiting, obstipation, nonreducible bulge that is tender with overlying skin changes ? ?All questions were answered to the satisfaction of the patient. ? ?Graciella Freer, DO ?[facility] ?[place], [place] [address] ?[phone] (office) ? ? ? ? ? ?

## 2021-09-10 ENCOUNTER — Ambulatory Visit (HOSPITAL_BASED_OUTPATIENT_CLINIC_OR_DEPARTMENT_OTHER)
Admission: RE | Admit: 2021-09-10 | Discharge: 2021-09-10 | Disposition: A | Discharge status: Home / Self Care | Payer: 59 | Source: Ambulatory Visit | Attending: Surgery | Admitting: Surgery

## 2021-09-10 DIAGNOSIS — K429 Umbilical hernia without obstruction or gangrene: Secondary | ICD-10-CM | POA: Diagnosis not present

## 2021-09-10 DIAGNOSIS — R1900 Intra-abdominal and pelvic swelling, mass and lump, unspecified site: Secondary | ICD-10-CM | POA: Insufficient documentation

## 2021-09-10 MED ORDER — IOHEXOL 300 MG/ML  SOLN
100.0000 mL | Freq: Once | INTRAMUSCULAR | Status: AC | PRN
  Administered 2021-09-10: 80 mL via INTRAVENOUS

## 2021-09-16 ENCOUNTER — Ambulatory Visit: Payer: 59 | Admitting: Surgery

## 2021-09-16 ENCOUNTER — Encounter: Payer: Self-pay | Admitting: Surgery

## 2021-09-16 VITALS — BP 120/78 | HR 80 | Temp 98.3°F | Resp 16 | Ht 63.0 in | Wt 216.0 lb

## 2021-09-16 DIAGNOSIS — K429 Umbilical hernia without obstruction or gangrene: Secondary | ICD-10-CM

## 2021-09-16 NOTE — Progress Notes (Signed)
[facility] Note   HPI:  36 y.o. Female presents to clinic for follow-up evaluation after obtaining CT abdomen and pelvis.  Since she was seen in the office, she has been doing well.  She continues to note a bulge below her umbilicus.  She has occasional tenderness associated with it.  She is tolerating a diet without nausea and vomiting, and she is moving her bowels without issue.  She denies fevers and chills.  Review of Systems:  All other review of systems: otherwise negative   Vital Signs:  BP 120/78   Pulse 80   Temp 98.3 F (36.8 C) (Oral)   Resp 16   Ht '5\' 3"'$  (1.6 m)   Wt 216 lb (98 kg)   SpO2 98%   BMI 38.26 kg/m    Physical Exam:  Physical Exam Vitals reviewed.  Constitutional:      Appearance: Normal appearance.  Abdominal:     Comments: Abdomen soft, nondistended, no percussion tenderness, nontender to palpation; no rigidity, guarding, rebound tenderness; soft reducible bulge inferior to umbilicus  Neurological:     Mental Status: She is alert.    Laboratory studies: None  Imaging:  CT abdomen pelvis (09/10/2021): IMPRESSION: Fat containing periumbilical hernia just to the right of the midline as described.   No other focal abnormality is seen.  Assessment:  36 y.o. yo Female who presents for evaluation of umbilical hernia  Plan:  -Given the finding on her CT scan, I recommended surgical intervention for her umbilical hernia -I again explained the pathophysiology of hernias and explained why we recommend repair -The risk and benefits of open umbilical hernia repair with possible mesh were discussed, including but not limited to bleeding, infection, injury to surrounding structures, hernia recurrence, and need for additional procedure.  After careful consideration, Brittney Farmer has decided to proceed with surgery -Patient tentatively scheduled for surgery on 6/21 -Advised the patient to present to the emergency department if she has nausea,  vomiting, obstipation, hernia bulge that is tender and not reducible with overlying skin changes  All of the above recommendations were discussed with the patient, and all of patient's questions were answered to her expressed satisfaction.  Graciella Freer, DO [facility] [address] [place], [place] [address] [phone] (office)

## 2021-09-20 NOTE — H&P (Signed)
[facility] History and Physical   Reason for Referral: Umbilical hernia Referring Physician: Evelina Dun, FNP   Chief Complaint   New Patient (Initial Visit)        Brittney Farmer is a 36 y.o. female.  HPI: Patient presents for evaluation of possible hernia.  She has noted that she had a bulge below her umbilicus for the last 2 months.  It has increased in size over the last month.  She denies pain associated with it.  She does confirm episodes of nausea without vomiting, however she has chronic issues with nausea.  She is moving her bowels without issue.  She denies ever having this bulge gets stuck and not be reducible.  She has a past medical history significant for anxiety, depression, IBS, and GERD.  Her surgical history significant for laparoscopic tubal ligation.  She denies use of blood thinning medications.  She denies use of tobacco products, alcohol, and illicit drugs.       Past Medical History:  Diagnosis Date   ADD (attention deficit disorder)     ADHD     Anemia     Anxiety     Back pain     Constipation     Depression     GERD (gastroesophageal reflux disease)     IBS (irritable bowel syndrome)     IBS (irritable bowel syndrome)     Palpitations     SOB (shortness of breath) on exertion     Spine curvature, acquired     Stomach ulcer             Past Surgical History:  Procedure Laterality Date   BIOPSY   07/27/2020    Procedure: BIOPSY;  Surgeon: Eloise Harman, DO;  Location: [facility];  Service: Endoscopy;;   COLONOSCOPY WITH PROPOFOL N/A 07/27/2020    Procedure: COLONOSCOPY WITH PROPOFOL;  Surgeon: Eloise Harman, DO;  Location: [facility];  Service: Endoscopy;  Laterality: N/A;  PM   ESOPHAGOGASTRODUODENOSCOPY (EGD) WITH PROPOFOL N/A 07/27/2020    Procedure: ESOPHAGOGASTRODUODENOSCOPY (EGD) WITH PROPOFOL;  Surgeon: Eloise Harman, DO;  Location: [facility];  Service: Endoscopy;  Laterality: N/A;   HAND SURGERY Left  09/08/2016    [place]   TUBAL LIGATION   03/13/2019   VAGINAL DELIVERY   03/13/2019           Family History  Problem Relation Age of Onset   Diabetes Mother     Hyperlipidemia Mother     Hypertension Mother     Heart disease Mother     Depression Mother     Anxiety disorder Mother     Alcoholism Mother     Hyperlipidemia Father     Hypertension Father     Alcoholism Father     Celiac disease Neg Hx     Inflammatory bowel disease Neg Hx     Colon cancer Neg Hx        Social History        Tobacco Use   Smoking status: Never   Smokeless tobacco: Never  Vaping Use   Vaping Use: Never used  Substance Use Topics   Alcohol use: No   Drug use: No      Medications: I have reviewed the patient's current medications. Allergies as of 08/26/2021         Reactions    Latex      Band aids - causes skin irritation  Medication List           Accurate as of August 26, 2021 11:59 PM. If you have any questions, ask your nurse or doctor.              buPROPion 150 MG 24 hr tablet Commonly known as: Wellbutrin XL Take 1 tablet (150 mg total) by mouth daily.    busPIRone 5 MG tablet Commonly known as: BUSPAR Take 1 tablet (5 mg total) by mouth 3 (three) times daily. Patients insurance has changed and she will update you all with new insurance    dicyclomine 10 MG capsule Commonly known as: BENTYL TAKE 1 CAPSULE BY MOUTH 4 TIMES DAILY BEFORE MEALS AND AT BEDTIME FOR ABDOMINAL PAIN    fluticasone 50 MCG/ACT nasal spray Commonly known as: FLONASE Place 2 sprays into both nostrils daily.    liraglutide 18 MG/3ML Sopn Commonly known as: VICTOZA Inject 0.6 mg into the skin daily for 7 days, THEN 1.2 mg daily for 7 days, THEN 1.8 mg daily for 7 days, THEN 1.8 mg daily for 14 days. Start taking on: August 17, 2021    loratadine 10 MG tablet Commonly known as: CLARITIN Take 1 tablet (10 mg total) by mouth daily.    omeprazole 40 MG capsule Commonly known  as: PRILOSEC Take 1 capsule (40 mg total) by mouth daily.    ondansetron 4 MG tablet Commonly known as: ZOFRAN Take 1 tablet (4 mg total) by mouth every 8 (eight) hours as needed for nausea or vomiting. TAKE 1 TABLET BY MOUTH EVERY 8 HOURS AS NEEDED FOR NAUSEA AND VOMITING    rizatriptan 10 MG tablet Commonly known as: MAXALT TAKE 1 TABLET BY MOUTH AS NEEDED FOR MIGRAINE. MAY REPEAT IN 2 HOURS IF NEEDED    topiramate 50 MG tablet Commonly known as: TOPAMAX TAKE 1 TABLET BY MOUTH TWICE A DAY    vitamin B-12 1000 MCG tablet Commonly known as: CYANOCOBALAMIN Take 1,000 mcg by mouth daily.    WOMENS MULTIVITAMIN PO Take 1 tablet by mouth daily.               ROS:  Constitutional: negative for chills, fatigue, and fevers Eyes: positive for blurred vision, negative for pain Ears, nose, mouth, throat, and face: negative for ear drainage, sore throat, and sinus problems Respiratory: negative for cough, wheezing, and shortness of breath Cardiovascular: negative for chest pain and palpitations Gastrointestinal: positive for abdominal pain, nausea, and reflux symptoms Genitourinary:positive for frequency, negative for dysuria and urinary retention Integument/breast: negative for dryness and rash Hematologic/lymphatic: negative for bleeding and lymphadenopathy Musculoskeletal:positive for back pain, negative for neck pain Neurological: negative for dizziness, tremors, and numbness Endocrine: positive for temperature intolerance   Blood pressure 122/79, pulse 79, temperature 98.5 F (36.9 C), temperature source Oral, resp. rate 14, height '5\' 3"'$  (1.6 m), weight 215 lb (97.5 kg), SpO2 98 %. Physical Exam Vitals reviewed.  Constitutional:      Appearance: Normal appearance.  HENT:     Head: Normocephalic and atraumatic.  Eyes:     Extraocular Movements: Extraocular movements intact.     Pupils: Pupils are equal, round, and reactive to light.  Cardiovascular:     Rate and Rhythm:  Normal rate and regular rhythm.  Pulmonary:     Effort: Pulmonary effort is normal.     Breath sounds: Normal breath sounds.  Abdominal:     Comments: Abdomen soft, nondistended, nontender to percussion and palpation; no rigidity, guarding, rebound tenderness; fullness  with bulge when sitting up below the umbilicus, no definitive hernia defect able to be palpated  Musculoskeletal:        General: Normal range of motion.     Cervical back: Normal range of motion.  Skin:    General: Skin is warm and dry.  Neurological:     General: No focal deficit present.     Mental Status: She is alert and oriented to person, place, and time.  Psychiatric:        Mood and Affect: Mood normal.        Behavior: Behavior normal.      Imaging:  CT abdomen pelvis (09/10/2021): IMPRESSION: Fat containing periumbilical hernia just to the right of the midline as described.   No other focal abnormality is seen.   Assessment:  36 y.o. yo Female who presents for evaluation of umbilical hernia   Plan:  -Given the finding on her CT scan, I recommended surgical intervention for her umbilical hernia -I again explained the pathophysiology of hernias and explained why we recommend repair -The risk and benefits of open umbilical hernia repair with possible mesh were discussed, including but not limited to bleeding, infection, injury to surrounding structures, hernia recurrence, and need for additional procedure.  After careful consideration, Brittney Farmer has decided to proceed with surgery -Patient tentatively scheduled for surgery on 6/21 -Advised the patient to present to the emergency department if she has nausea, vomiting, obstipation, hernia bulge that is tender and not reducible with overlying skin changes   All of the above recommendations were discussed with the patient, and all of patient's questions were answered to her expressed satisfaction.   Brittney Freer, DO [facility] [address] [place], [place] [address] [phone] (office)

## 2021-09-28 ENCOUNTER — Encounter: Payer: Self-pay | Admitting: Family

## 2021-09-28 ENCOUNTER — Ambulatory Visit (INDEPENDENT_AMBULATORY_CARE_PROVIDER_SITE_OTHER): Payer: 59 | Admitting: Family

## 2021-09-28 VITALS — BP 100/59 | HR 74 | Temp 98.3°F | Ht 63.0 in | Wt 217.1 lb

## 2021-09-28 DIAGNOSIS — F339 Major depressive disorder, recurrent, unspecified: Secondary | ICD-10-CM | POA: Diagnosis not present

## 2021-09-28 DIAGNOSIS — L255 Unspecified contact dermatitis due to plants, except food: Secondary | ICD-10-CM | POA: Diagnosis not present

## 2021-09-28 DIAGNOSIS — F411 Generalized anxiety disorder: Secondary | ICD-10-CM | POA: Diagnosis not present

## 2021-09-28 DIAGNOSIS — R69 Illness, unspecified: Secondary | ICD-10-CM | POA: Diagnosis not present

## 2021-09-28 MED ORDER — FLUOXETINE HCL 10 MG PO TABS: 10.0000 mg | ORAL_TABLET | Freq: Every day | ORAL | 0 refills | Status: DC

## 2021-09-28 MED ORDER — METHYLPREDNISOLONE ACETATE 80 MG/ML IJ SUSP
80.0000 mg | Freq: Once | INTRAMUSCULAR | Status: AC
  Administered 2021-09-28: 80 mg via INTRAMUSCULAR

## 2021-09-28 NOTE — Progress Notes (Signed)
Subjective:    Patient ID: Brittney Farmer, female    DOB: Jul 22, 1985, 36 y.o.   MRN: [id number]  Chief Complaint  Patient presents with   Anxiety   Depression   Pt presents to the office today to follow up on GAD and depression. She was seen on 08/17/21 and started on Wellbutrin 150 mg. She reports it did seem to help with GAD and depression, but causes insomnia. She tried taking in AM and PM and still could not sleep. She has continued the Buspar 5 mg daily prn.   She has taken Lexapro and Cymbalta without success.   She is scheduled for hernia repair on 10/20/21.   She is morbid obese with a BMI of 38 and Depression and hyperlipidemia.  Anxiety Presents for follow-up visit. Symptoms include irritability, nervous/anxious behavior and restlessness. Patient reports no depressed mood or excessive worry. Symptoms occur occasionally. The severity of symptoms is moderate.    Depression        This is a chronic problem.  The current episode started more than 1 year ago.   The problem occurs intermittently.  Associated symptoms include irritable, restlessness and sad.  Associated symptoms include no helplessness and no hopelessness.  Past medical history includes anxiety.   Rash This is a new problem. The current episode started 1 to 4 weeks ago. The problem has been gradually worsening since onset. The affected locations include the face, left arm, groin, abdomen, left upper leg, left lower leg and right hand. The rash is characterized by redness and itchiness. She was exposed to plant contact.     Review of Systems  Constitutional:  Positive for irritability.  Skin:  Positive for rash.  Psychiatric/Behavioral:  Positive for depression. The patient is nervous/anxious.   All other systems reviewed and are negative.     Objective:   Physical Exam Vitals reviewed.  Constitutional:      General: She is irritable. She is not in acute distress.    Appearance: She is well-developed. She  is obese.  HENT:     Head: Normocephalic and atraumatic.  Eyes:     Pupils: Pupils are equal, round, and reactive to light.  Neck:     Thyroid: No thyromegaly.  Cardiovascular:     Rate and Rhythm: Normal rate and regular rhythm.     Heart sounds: Normal heart sounds. No murmur heard. Pulmonary:     Effort: Pulmonary effort is normal. No respiratory distress.     Breath sounds: Normal breath sounds. No wheezing.  Abdominal:     General: Bowel sounds are normal. There is no distension.     Palpations: Abdomen is soft.     Tenderness: There is no abdominal tenderness.  Musculoskeletal:        General: No tenderness. Normal range of motion.     Cervical back: Normal range of motion and neck supple.  Skin:    General: Skin is warm and dry.     Findings: Rash present.          Comments: Erythemas papule rash  Neurological:     Mental Status: She is alert and oriented to person, place, and time.     Cranial Nerves: No cranial nerve deficit.     Deep Tendon Reflexes: Reflexes are normal and symmetric.  Psychiatric:        Behavior: Behavior normal.        Thought Content: Thought content normal.        Judgment: Judgment  normal.      BP (!) 100/59   Pulse 74   Temp 98.3 F (36.8 C) (Temporal)   Ht '5\' 3"'$  (1.6 m)   Wt 217 lb 2 oz (98.5 kg)   SpO2 99%   BMI 38.46 kg/m      Assessment & Plan:  Brittney Farmer comes in today with chief complaint of Anxiety and Depression   Diagnosis and orders addressed:  1. GAD (generalized anxiety disorder) Will start Prozac 10 mg  Stress management  Sleep ritual  - FLUoxetine (PROZAC) 10 MG tablet; Take 1 tablet (10 mg total) by mouth daily.  Dispense: 90 tablet; Refill: 0  2. Recurrent major depressive disorder, remission status unspecified (HCC) - FLUoxetine (PROZAC) 10 MG tablet; Take 1 tablet (10 mg total) by mouth daily.  Dispense: 90 tablet; Refill: 0  3. Morbid obesity ([place])  4. Contact dermatitis due to plant Avoid  scratching  -Wear protective clothing while outside- Long sleeves and long pants -Take a shower as soon as possible after being outside -Follow up if symptoms worsen or do not improve  - methylPREDNISolone acetate (DEPO-MEDROL) injection 80 mg    Health Maintenance reviewed Diet and exercise encouraged  Follow up plan: 1 month    Evelina Dun, FNP

## 2021-09-28 NOTE — Patient Instructions (Signed)
Insomnia Insomnia is a sleep disorder that makes it difficult to fall asleep or stay asleep. Insomnia can cause fatigue, low energy, difficulty concentrating, mood swings, and poor performance at work or school. There are three different ways to classify insomnia: Difficulty falling asleep. Difficulty staying asleep. Waking up too early in the morning. Any type of insomnia can be long-term (chronic) or short-term (acute). Both are common. Short-term insomnia usually lasts for 3 months or less. Chronic insomnia occurs at least three times a week for longer than 3 months. What are the causes? Insomnia may be caused by another condition, situation, or substance, such as: Having certain mental health conditions, such as anxiety and depression. Using caffeine, alcohol, tobacco, or drugs. Having gastrointestinal conditions, such as gastroesophageal reflux disease (GERD). Having certain medical conditions. These include: Asthma. Alzheimer's disease. Stroke. Chronic pain. An overactive thyroid gland (hyperthyroidism). Other sleep disorders, such as restless legs syndrome and sleep apnea. Menopause. Sometimes, the cause of insomnia may not be known. What increases the risk? Risk factors for insomnia include: Gender. Females are affected more often than males. Age. Insomnia is more common as people get older. Stress and certain medical and mental health conditions. Lack of exercise. Having an irregular work schedule. This may include working night shifts and traveling between different time zones. What are the signs or symptoms? If you have insomnia, the main symptom is having trouble falling asleep or having trouble staying asleep. This may lead to other symptoms, such as: Feeling tired or having low energy. Feeling nervous about going to sleep. Not feeling rested in the morning. Having trouble concentrating. Feeling irritable, anxious, or depressed. How is this diagnosed? This condition  may be diagnosed based on: Your symptoms and medical history. Your health care provider may ask about: Your sleep habits. Any medical conditions you have. Your mental health. A physical exam. How is this treated? Treatment for insomnia depends on the cause. Treatment may focus on treating an underlying condition that is causing the insomnia. Treatment may also include: Medicines to help you sleep. Counseling or therapy. Lifestyle adjustments to help you sleep better. Follow these instructions at home: Eating and drinking  Limit or avoid alcohol, caffeinated beverages, and products that contain nicotine and tobacco, especially close to bedtime. These can disrupt your sleep. Do not eat a large meal or eat spicy foods right before bedtime. This can lead to digestive discomfort that can make it hard for you to sleep. Sleep habits  Keep a sleep diary to help you and your health care provider figure out what could be causing your insomnia. Write down: When you sleep. When you wake up during the night. How well you sleep and how rested you feel the next day. Any side effects of medicines you are taking. What you eat and drink. Make your bedroom a dark, comfortable place where it is easy to fall asleep. Put up shades or blackout curtains to block light from outside. Use a white noise machine to block noise. Keep the temperature cool. Limit screen use before bedtime. This includes: Not watching TV. Not using your smartphone, tablet, or computer. Stick to a routine that includes going to bed and waking up at the same times every day and night. This can help you fall asleep faster. Consider making a quiet activity, such as reading, part of your nighttime routine. Try to avoid taking naps during the day so that you sleep better at night. Get out of bed if you are still awake after   15 minutes of trying to sleep. Keep the lights down, but try reading or doing a quiet activity. When you feel  sleepy, go back to bed. General instructions Take over-the-counter and prescription medicines only as told by your health care provider. Exercise regularly as told by your health care provider. However, avoid exercising in the hours right before bedtime. Use relaxation techniques to manage stress. Ask your health care provider to suggest some techniques that may work well for you. These may include: Breathing exercises. Routines to release muscle tension. Visualizing peaceful scenes. Make sure that you drive carefully. Do not drive if you feel very sleepy. Keep all follow-up visits. This is important. Contact a health care provider if: You are tired throughout the day. You have trouble in your daily routine due to sleepiness. You continue to have sleep problems, or your sleep problems get worse. Get help right away if: You have thoughts about hurting yourself or someone else. Get help right away if you feel like you may hurt yourself or others, or have thoughts about taking your own life. Go to your nearest emergency room or: Call 911. Call the National Suicide Prevention Lifeline at 1-800-273-8255 or 988. This is open 24 hours a day. Text the Crisis Text Line at 741741. Summary Insomnia is a sleep disorder that makes it difficult to fall asleep or stay asleep. Insomnia can be long-term (chronic) or short-term (acute). Treatment for insomnia depends on the cause. Treatment may focus on treating an underlying condition that is causing the insomnia. Keep a sleep diary to help you and your health care provider figure out what could be causing your insomnia. This information is not intended to replace advice given to you by your health care provider. Make sure you discuss any questions you have with your health care provider. Document Revised: 03/29/2021 Document Reviewed: 03/29/2021 [employer] Patient Education  2023 [employer]  

## 2021-10-12 ENCOUNTER — Encounter (INDEPENDENT_AMBULATORY_CARE_PROVIDER_SITE_OTHER): Payer: Self-pay | Admitting: Family Medicine

## 2021-10-12 ENCOUNTER — Ambulatory Visit (INDEPENDENT_AMBULATORY_CARE_PROVIDER_SITE_OTHER): Payer: 59 | Admitting: Family Medicine

## 2021-10-12 VITALS — BP 116/80 | HR 77 | Temp 98.6°F | Ht 63.0 in | Wt 223.2 lb

## 2021-10-12 DIAGNOSIS — E538 Deficiency of other specified B group vitamins: Secondary | ICD-10-CM | POA: Diagnosis not present

## 2021-10-12 DIAGNOSIS — E88819 Insulin resistance, unspecified: Secondary | ICD-10-CM

## 2021-10-12 DIAGNOSIS — Z6839 Body mass index (BMI) 39.0-39.9, adult: Secondary | ICD-10-CM | POA: Diagnosis not present

## 2021-10-12 DIAGNOSIS — E559 Vitamin D deficiency, unspecified: Secondary | ICD-10-CM

## 2021-10-12 DIAGNOSIS — F39 Unspecified mood [affective] disorder: Secondary | ICD-10-CM

## 2021-10-12 DIAGNOSIS — E7849 Other hyperlipidemia: Secondary | ICD-10-CM | POA: Diagnosis not present

## 2021-10-12 DIAGNOSIS — E669 Obesity, unspecified: Secondary | ICD-10-CM | POA: Diagnosis not present

## 2021-10-12 DIAGNOSIS — Z6841 Body Mass Index (BMI) 40.0 and over, adult: Secondary | ICD-10-CM

## 2021-10-12 DIAGNOSIS — R69 Illness, unspecified: Secondary | ICD-10-CM | POA: Diagnosis not present

## 2021-10-12 DIAGNOSIS — E8881 Metabolic syndrome: Secondary | ICD-10-CM | POA: Diagnosis not present

## 2021-10-12 DIAGNOSIS — Z9189 Other specified personal risk factors, not elsewhere classified: Secondary | ICD-10-CM

## 2021-10-14 NOTE — Patient Instructions (Signed)
Brittney Farmer  10/14/2021     '@[email or web address]'$ @   Your procedure is scheduled on  10/20/2021.   Report to [facility] at  0600  A.M.   Call this number if you have problems the morning of surgery:  [phone]   Remember:  Do not eat or drink after midnight.      Take these medicines the morning of surgery with A SIP OF WATER           wellbutrin, buspar, claritin, prilosec, zofran (if needed), maxalt(if needed), topamax.     Do not wear jewelry, make-up or nail polish.  Do not wear lotions, powders, or perfumes, or deodorant.  Do not shave 48 hours prior to surgery.  Men may shave face and neck.  Do not bring valuables to the hospital.  [facility] is not responsible for any belongings or valuables.  Contacts, dentures or bridgework may not be worn into surgery.  Leave your suitcase in the car.  After surgery it may be brought to your room.  For patients admitted to the hospital, discharge time will be determined by your treatment team.  Patients discharged the day of surgery will not be allowed to drive home and must have someone with them for 24 hours.    Special instructions:   DO NOT smoke tobacco or vape for 24 hours before your procedure.  Please read over the following fact sheets that you were given. Coughing and Deep Breathing, Surgical Site Infection Prevention, Anesthesia Post-op Instructions, and Care and Recovery After Surgery      Open Hernia Repair, Adult, Care After What can I expect after the procedure? After the procedure, it is common to have: Mild discomfort. Slight bruising. Mild swelling. Pain in the belly (abdomen). A small amount of blood from the cut from surgery (incision). Follow these instructions at home: Your doctor may give you more specific instructions. If you have problems, call your doctor. Medicines Take over-the-counter and prescription medicines only as told by your doctor. If told, take steps to prevent  problems with pooping (constipation). You may need to: Drink enough fluid to keep your pee (urine) pale yellow. Take medicines. You will be told what medicines to take. Eat foods that are high in fiber. These include beans, whole grains, and fresh fruits and vegetables. Limit foods that are high in fat and sugar. These include fried or sweet foods. Ask your doctor if you should avoid driving or using machines while you are taking your medicine. Incision care  Follow instructions from your doctor about how to take care of your incision. Make sure you: Wash your hands with soap and water for at least 20 seconds before and after you change your bandage (dressing). If you cannot use soap and water, use hand sanitizer. Change your bandage. Leave stitches or skin glue in place for at least 2 weeks. Leave tape strips alone unless you are told to take them off. You may trim the edges of the tape strips if they curl up. Check your incision every day for signs of infection. Check for: More redness, swelling, or pain. More fluid or blood. Warmth. Pus or a bad smell. Wear loose, soft clothing while your incision heals. Activity  Rest as told by your doctor. Do not lift anything that is heavier than 10 lb (4.5 kg), or the limit that you are told. Do not play contact sports until your doctor says that this is safe. If you  were given a sedative during your procedure, do not drive or use machines until your doctor says that it is safe. A sedative is a medicine that helps you relax. Return to your normal activities when your doctor says that it is safe. General instructions Do not take baths, swim, or use a hot tub. Ask your doctor about taking showers or sponge baths. Hold a pillow over your belly when you cough or sneeze. This helps with pain. Do not smoke or use any products that contain nicotine or tobacco. If you need help quitting, ask your doctor. Keep all follow-up visits. Contact a doctor  if: You have any of these signs of infection in or around your incision: More redness, swelling, or pain. More fluid or blood. Warmth. Pus. A bad smell. You have a fever or chills. You have blood in your poop (stool). You have not pooped (had a bowel movement) in 2-3 days. Medicine does not help your pain. Get help right away if: You have chest pain, or you are short of breath. You feel faint or light-headed. You have very bad pain. You vomit and your pain is worse. You have pain, swelling, or redness in a leg. These symptoms may be an emergency. Get help right away. Call your local emergency services (911 in the [place] Do not wait to see if the symptoms will go away. Do not drive yourself to the hospital. Summary After this procedure, it is common to have mild discomfort, slight bruising, and mild swelling. Follow instructions from your doctor about how to take care of your cut from surgery (incision). Check every day for signs of infection. Do not lift heavy objects or play contact sports until your doctor says it is safe. Return to your normal activities as told by your doctor. This information is not intended to replace advice given to you by your health care provider. Make sure you discuss any questions you have with your health care provider. Document Revised: 12/02/2019 Document Reviewed: 12/02/2019 Elsevier Patient Education  [place] Anesthesia, Adult, Care After This sheet gives you information about how to care for yourself after your procedure. Your health care provider may also give you more specific instructions. If you have problems or questions, contact your health care provider. What can I expect after the procedure? After the procedure, the following side effects are common: Pain or discomfort at the IV site. Nausea. Vomiting. Sore throat. Trouble concentrating. Feeling cold or chills. Feeling weak or tired. Sleepiness and fatigue. Soreness  and body aches. These side effects can affect parts of the body that were not involved in surgery. Follow these instructions at home: For the time period you were told by your health care provider:  Rest. Do not participate in activities where you could fall or become injured. Do not drive or use machinery. Do not drink alcohol. Do not take sleeping pills or medicines that cause drowsiness. Do not make important decisions or sign legal documents. Do not take care of children on your own. Eating and drinking Follow any instructions from your health care provider about eating or drinking restrictions. When you feel hungry, start by eating small amounts of foods that are soft and easy to digest (bland), such as toast. Gradually return to your regular diet. Drink enough fluid to keep your urine pale yellow. If you vomit, rehydrate by drinking water, juice, or clear broth. General instructions If you have sleep apnea, surgery and certain medicines can increase your risk for  breathing problems. Follow instructions from your health care provider about wearing your sleep device: Anytime you are sleeping, including during daytime naps. While taking prescription pain medicines, sleeping medicines, or medicines that make you drowsy. Have a responsible adult stay with you for the time you are told. It is important to have someone help care for you until you are awake and alert. Return to your normal activities as told by your health care provider. Ask your health care provider what activities are safe for you. Take over-the-counter and prescription medicines only as told by your health care provider. If you smoke, do not smoke without supervision. Keep all follow-up visits as told by your health care provider. This is important. Contact a health care provider if: You have nausea or vomiting that does not get better with medicine. You cannot eat or drink without vomiting. You have pain that does not  get better with medicine. You are unable to pass urine. You develop a skin rash. You have a fever. You have redness around your IV site that gets worse. Get help right away if: You have difficulty breathing. You have chest pain. You have blood in your urine or stool, or you vomit blood. Summary After the procedure, it is common to have a sore throat or nausea. It is also common to feel tired. Have a responsible adult stay with you for the time you are told. It is important to have someone help care for you until you are awake and alert. When you feel hungry, start by eating small amounts of foods that are soft and easy to digest (bland), such as toast. Gradually return to your regular diet. Drink enough fluid to keep your urine pale yellow. Return to your normal activities as told by your health care provider. Ask your health care provider what activities are safe for you. This information is not intended to replace advice given to you by your health care provider. Make sure you discuss any questions you have with your health care provider. Document Revised: 01/02/2020 Document Reviewed: 08/01/2019 Elsevier Patient Education  [place]. How to Use Chlorhexidine for Bathing Chlorhexidine gluconate (CHG) is a germ-killing (antiseptic) solution that is used to clean the skin. It can get rid of the bacteria that normally live on the skin and can keep them away for about 24 hours. To clean your skin with CHG, you may be given: A CHG solution to use in the shower or as part of a sponge bath. A prepackaged cloth that contains CHG. Cleaning your skin with CHG may help lower the risk for infection: While you are staying in the intensive care unit of the hospital. If you have a vascular access, such as a central line, to provide short-term or long-term access to your veins. If you have a catheter to drain urine from your bladder. If you are on a ventilator. A ventilator is a machine that  helps you breathe by moving air in and out of your lungs. After surgery. What are the risks? Risks of using CHG include: A skin reaction. Hearing loss, if CHG gets in your ears and you have a perforated eardrum. Eye injury, if CHG gets in your eyes and is not rinsed out. The CHG product catching fire. Make sure that you avoid smoking and flames after applying CHG to your skin. Do not use CHG: If you have a chlorhexidine allergy or have previously reacted to chlorhexidine. On babies younger than 39 months of age. How to  use CHG solution Use CHG only as told by your health care provider, and follow the instructions on the label. Use the full amount of CHG as directed. Usually, this is one bottle. During a shower Follow these steps when using CHG solution during a shower (unless your health care provider gives you different instructions): Start the shower. Use your normal soap and shampoo to wash your face and hair. Turn off the shower or move out of the shower stream. Pour the CHG onto a clean washcloth. Do not use any type of brush or rough-edged sponge. Starting at your neck, lather your body down to your toes. Make sure you follow these instructions: If you will be having surgery, pay special attention to the part of your body where you will be having surgery. Scrub this area for at least 1 minute. Do not use CHG on your head or face. If the solution gets into your ears or eyes, rinse them well with water. Avoid your genital area. Avoid any areas of skin that have broken skin, cuts, or scrapes. Scrub your back and under your arms. Make sure to wash skin folds. Let the lather sit on your skin for 1-2 minutes or as long as told by your health care provider. Thoroughly rinse your entire body in the shower. Make sure that all body creases and crevices are rinsed well. Dry off with a clean towel. Do not put any substances on your body afterward--such as powder, lotion, or perfume--unless you  are told to do so by your health care provider. Only use lotions that are recommended by the manufacturer. Put on clean clothes or pajamas. If it is the night before your surgery, sleep in clean sheets.  During a sponge bath Follow these steps when using CHG solution during a sponge bath (unless your health care provider gives you different instructions): Use your normal soap and shampoo to wash your face and hair. Pour the CHG onto a clean washcloth. Starting at your neck, lather your body down to your toes. Make sure you follow these instructions: If you will be having surgery, pay special attention to the part of your body where you will be having surgery. Scrub this area for at least 1 minute. Do not use CHG on your head or face. If the solution gets into your ears or eyes, rinse them well with water. Avoid your genital area. Avoid any areas of skin that have broken skin, cuts, or scrapes. Scrub your back and under your arms. Make sure to wash skin folds. Let the lather sit on your skin for 1-2 minutes or as long as told by your health care provider. Using a different clean, wet washcloth, thoroughly rinse your entire body. Make sure that all body creases and crevices are rinsed well. Dry off with a clean towel. Do not put any substances on your body afterward--such as powder, lotion, or perfume--unless you are told to do so by your health care provider. Only use lotions that are recommended by the manufacturer. Put on clean clothes or pajamas. If it is the night before your surgery, sleep in clean sheets. How to use CHG prepackaged cloths Only use CHG cloths as told by your health care provider, and follow the instructions on the label. Use the CHG cloth on clean, dry skin. Do not use the CHG cloth on your head or face unless your health care provider tells you to. When washing with the CHG cloth: Avoid your genital area. Avoid any areas  of skin that have broken skin, cuts, or  scrapes. Before surgery Follow these steps when using a CHG cloth to clean before surgery (unless your health care provider gives you different instructions): Using the CHG cloth, vigorously scrub the part of your body where you will be having surgery. Scrub using a back-and-forth motion for 3 minutes. The area on your body should be completely wet with CHG when you are done scrubbing. Do not rinse. Discard the cloth and let the area air-dry. Do not put any substances on the area afterward, such as powder, lotion, or perfume. Put on clean clothes or pajamas. If it is the night before your surgery, sleep in clean sheets.  For general bathing Follow these steps when using CHG cloths for general bathing (unless your health care provider gives you different instructions). Use a separate CHG cloth for each area of your body. Make sure you wash between any folds of skin and between your fingers and toes. Wash your body in the following order, switching to a new cloth after each step: The front of your neck, shoulders, and chest. Both of your arms, under your arms, and your hands. Your stomach and groin area, avoiding the genitals. Your right leg and foot. Your left leg and foot. The back of your neck, your back, and your buttocks. Do not rinse. Discard the cloth and let the area air-dry. Do not put any substances on your body afterward--such as powder, lotion, or perfume--unless you are told to do so by your health care provider. Only use lotions that are recommended by the manufacturer. Put on clean clothes or pajamas. Contact a health care provider if: Your skin gets irritated after scrubbing. You have questions about using your solution or cloth. You swallow any chlorhexidine. Call your local poison control center (1-(218)637-6488 in the [place] Get help right away if: Your eyes itch badly, or they become very red or swollen. Your skin itches badly and is red or swollen. Your hearing  changes. You have trouble seeing. You have swelling or tingling in your mouth or throat. You have trouble breathing. These symptoms may represent a serious problem that is an emergency. Do not wait to see if the symptoms will go away. Get medical help right away. Call your local emergency services (911 in the [place] Do not drive yourself to the hospital. Summary Chlorhexidine gluconate (CHG) is a germ-killing (antiseptic) solution that is used to clean the skin. Cleaning your skin with CHG may help to lower your risk for infection. You may be given CHG to use for bathing. It may be in a bottle or in a prepackaged cloth to use on your skin. Carefully follow your health care provider's instructions and the instructions on the product label. Do not use CHG if you have a chlorhexidine allergy. Contact your health care provider if your skin gets irritated after scrubbing. This information is not intended to replace advice given to you by your health care provider. Make sure you discuss any questions you have with your health care provider. Document Revised: 06/29/2020 Document Reviewed: 06/29/2020 Elsevier Patient Education  [place].

## 2021-10-17 NOTE — Progress Notes (Unsigned)
Chief Complaint:   OBESITY Brittney Farmer is here to discuss her progress with her obesity treatment plan along with follow-up of her obesity related diagnoses. Brittney Farmer is on the Category 2 Plan and states she is following her eating plan approximately 0% of the time. Brittney Farmer states she is not currently exercising.  Today's visit was #: 20 Starting weight: 235 lbs Starting date: 02/20/2020 Today's weight: 223 lbs Today's date: 10/12/2021 Total lbs lost to date: 12 Total lbs lost since last in-office visit: +33  Interim History: Brittney Farmer is going through a divorce that's been full of turmoil. She has not been focused on her own health and wellbeing. She was last seen by PA Tracey on 07/06/2021 and has gained 33 lbs since then. Pt has not been on any meal plan.  Subjective:   1. Insulin resistance Brittney Farmer's fasting insulin has been over 5.0 in the past. Medication: None  2. Vitamin D deficiency She has been off Vitamin D for 2-3 months and reports fatigue/tiredness.  3. B12 deficiency Pt reports fatigue and tiredness. Medication: OTC B12 1000 mcg  4. Mood disorder ([place]) Brittney Farmer has been struggling lately with anxiety. She is working with her PCP, who recently changed her meds.  5. Other hyperlipidemia Pt has an elevated LDL on last labs.  6. At risk for impaired metabolic function Brittney Farmer is at risk for impaired metabolic function due to insulin resistance.  Assessment/Plan:  No orders of the defined types were placed in this encounter.   There are no discontinued medications.   No orders of the defined types were placed in this encounter.    1. Insulin resistance Brittney Farmer will continue to work on weight loss, exercise, and decreasing simple carbohydrates to help decrease the risk of diabetes. Brittney Farmer agreed to follow-up with [place] as directed to closely monitor her progress. She desires a GLP-1 and will contact insurance to see what is covered.  2. Vitamin D deficiency Low  Vitamin D level contributes to fatigue and are associated with obesity, breast, and colon cancer. She will have fasting labs drawn at next OV and follow-up for routine testing of Vitamin D, at least 2-3 times per year to avoid over-replacement.  3. B12 deficiency The diagnosis was reviewed with the patient. Counseling provided today, see below. We will continue to monitor. Orders and follow up as documented in patient record. Check fasting labs at next OV.  Counseling The body needs vitamin B12: to make red blood cells; to make DNA; and to help the nerves work properly so they can carry messages from the brain to the body.  The main causes of vitamin B12 deficiency include dietary deficiency, digestive diseases, pernicious anemia, and having a surgery in which part of the stomach or small intestine is removed.  Certain medicines can make it harder for the body to absorb vitamin B12. These medicines include: heartburn medications; some antibiotics; some medications used to treat diabetes, gout, and high cholesterol.  In some cases, there are no symptoms of this condition. If the condition leads to anemia or nerve damage, various symptoms can occur, such as weakness or fatigue, shortness of breath, and numbness or tingling in your hands and feet.   Treatment:  May include taking vitamin B12 supplements.  Avoid alcohol.  Eat lots of healthy foods that contain vitamin B12: Beef, pork, chicken, [place], and organ meats, such as liver.  Seafood: This includes clams, rainbow trout, salmon, tuna, and haddock. Eggs.  Cereal and dairy products that are  fortified: This means that vitamin B12 has been added to the food.    4. Mood disorder (HCC) Continue Prozac, Wellbutrin, and Buspar. I recommend pt discuss with her PCP to increase Buspar if generalized anxiety disorder is still poorly controlled.  5. Other hyperlipidemia Cardiovascular risk and specific lipid/LDL goals reviewed.  We discussed several  lifestyle modifications today and Brittney Farmer will continue to work on diet, exercise and weight loss efforts. Orders and follow up as documented in patient record. Check fasting labs at next OV.  Counseling Intensive lifestyle modifications are the first line treatment for this issue. Dietary changes: Increase soluble fiber. Decrease simple carbohydrates. Exercise changes: Moderate to vigorous-intensity aerobic activity 150 minutes per week if tolerated. Lipid-lowering medications: see documented in medical record.  6. At risk for impaired metabolic function Due to Brittney Farmer's current state of health and medical condition(s), she is at a significantly higher risk for impaired metabolic function. At least 15 minutes was spent on counseling Brittney Farmer about these concerns today. This places the patient at a much greater risk to subsequently develop cardio-pulmonary conditions that can negatively affect the patient's quality of life. I stressed the importance of reversing these risks factors. The initial goal is to lose at least 5-10% of starting weight to help reduce risk factors. Counseling: Intensive lifestyle modifications discussed with Brittney Farmer as the most appropriate first line treatment. She will continue to work on diet, exercise, and weight loss efforts. We will continue to reassess these conditions on a fairly regular basis in an attempt to decrease the patient's overall morbidity and mortality.  7. Obesity with current BMI of 39.5 Brittney Farmer is currently in the action stage of change. As such, her goal is to continue with weight loss efforts. She has agreed to the Category 2 Plan.   Exercise goals:  As is  Behavioral modification strategies: meal planning and cooking strategies and planning for success.  Brittney Farmer has agreed to follow-up with our clinic in 2-3 weeks (come fasting for labs and repeat IC). She was informed of the importance of frequent follow-up visits to maximize her success with  intensive lifestyle modifications for her multiple health conditions.   Objective:   Blood pressure 116/80, pulse 77, temperature 98.6 F (37 C), height '5\' 3"'$  (1.6 m), weight 223 lb 3.2 oz (101.2 kg), SpO2 99 %. Body mass index is 39.54 kg/m.  General: Cooperative, alert, well developed, in no acute distress. HEENT: Conjunctivae and lids unremarkable. Cardiovascular: Regular rhythm.  Lungs: Normal work of breathing. Neurologic: No focal deficits.   Lab Results  Component Value Date   CREATININE 0.75 05/12/2021   BUN 12 05/12/2021   NA 142 05/12/2021   K 4.7 05/12/2021   CL 105 05/12/2021   CO2 23 05/12/2021   Lab Results  Component Value Date   ALT 8 05/12/2021   AST 7 05/12/2021   ALKPHOS 90 05/12/2021   BILITOT 0.5 05/12/2021   Lab Results  Component Value Date   HGBA1C 5.1 02/20/2020   Lab Results  Component Value Date   INSULIN 5.2 02/20/2020   Lab Results  Component Value Date   TSH 1.010 02/20/2020   Lab Results  Component Value Date   CHOL 199 05/12/2021   HDL 45 05/12/2021   LDLCALC 142 (H) 05/12/2021   TRIG 65 05/12/2021   CHOLHDL 4.4 05/12/2021   Lab Results  Component Value Date   VD25OH 40.6 05/12/2021   VD25OH 69.1 09/01/2020   VD25OH 30.0 02/20/2020   Lab Results  Component Value Date   WBC 7.5 07/01/2020   HGB 14.1 07/01/2020   HCT 43.2 07/01/2020   MCV 87 07/01/2020   PLT 353 07/01/2020   Lab Results  Component Value Date   IRON 38 02/20/2020   TIBC 284 02/20/2020   FERRITIN 103 02/20/2020    Attestation Statements:   Reviewed by clinician on day of visit: allergies, medications, problem list, medical history, surgical history, family history, social history, and previous encounter notes.  I, Kathlene November, BS, CMA, am acting as transcriptionist for [employer], DO.  I have reviewed the above documentation for accuracy and completeness, and I agree with the above. Brittney Farmer, D.O.  The [place] was signed into law in 2016 which includes the topic of electronic health records.  This provides immediate access to information in MyChart.  This includes consultation notes, operative notes, office notes, lab results and pathology reports.  If you have any questions about what you read please let us know at your next visit so we can discuss your concerns and take corrective action if need be.  We are right here with you.

## 2021-10-18 ENCOUNTER — Encounter (HOSPITAL_COMMUNITY): Payer: Self-pay

## 2021-10-18 ENCOUNTER — Encounter (HOSPITAL_COMMUNITY)
Admission: RE | Admit: 2021-10-18 | Discharge: 2021-10-18 | Disposition: A | Discharge status: Home / Self Care | Payer: 59 | Source: Ambulatory Visit | Attending: Surgery | Admitting: Surgery

## 2021-10-18 VITALS — BP 120/61 | HR 76 | Temp 98.6°F | Resp 18 | Ht 63.0 in | Wt 223.2 lb

## 2021-10-18 DIAGNOSIS — Z01812 Encounter for preprocedural laboratory examination: Secondary | ICD-10-CM | POA: Diagnosis not present

## 2021-10-18 DIAGNOSIS — D649 Anemia, unspecified: Secondary | ICD-10-CM | POA: Diagnosis not present

## 2021-10-18 DIAGNOSIS — Z01818 Encounter for other preprocedural examination: Secondary | ICD-10-CM

## 2021-10-18 LAB — CBC WITH DIFFERENTIAL/PLATELET
Abs Immature Granulocytes: 0.02 10*3/uL (ref 0.00–0.07)
Basophils Absolute: 0 10*3/uL (ref 0.0–0.1)
Basophils Relative: 0 %
Eosinophils Absolute: 0.1 10*3/uL (ref 0.0–0.5)
Eosinophils Relative: 2 %
HCT: 40.4 % (ref 36.0–46.0)
Hemoglobin: 13.2 g/dL (ref 12.0–15.0)
Immature Granulocytes: 0 %
Lymphocytes Relative: 22 %
Lymphs Abs: 1.7 10*3/uL (ref 0.7–4.0)
MCH: 29.2 pg (ref 26.0–34.0)
MCHC: 32.7 g/dL (ref 30.0–36.0)
MCV: 89.4 fL (ref 80.0–100.0)
Monocytes Absolute: 0.5 10*3/uL (ref 0.1–1.0)
Monocytes Relative: 6 %
Neutro Abs: 5.3 10*3/uL (ref 1.7–7.7)
Neutrophils Relative %: 70 %
Platelets: 313 10*3/uL (ref 150–400)
RBC: 4.52 MIL/uL (ref 3.87–5.11)
RDW: 13.2 % (ref 11.5–15.5)
WBC: 7.8 10*3/uL (ref 4.0–10.5)
nRBC: 0 % (ref 0.0–0.2)

## 2021-10-18 LAB — POCT PREGNANCY, URINE: Preg Test, Ur: NEGATIVE

## 2021-10-20 ENCOUNTER — Encounter (HOSPITAL_COMMUNITY): Payer: Self-pay | Admitting: Surgery

## 2021-10-20 ENCOUNTER — Other Ambulatory Visit: Payer: Self-pay

## 2021-10-20 ENCOUNTER — Ambulatory Visit (HOSPITAL_COMMUNITY): Payer: 59 | Admitting: Anesthesiology

## 2021-10-20 ENCOUNTER — Ambulatory Visit (HOSPITAL_COMMUNITY)
Admission: RE | Admit: 2021-10-20 | Discharge: 2021-10-20 | Disposition: A | Discharge status: Home / Self Care | Payer: 59 | Attending: Surgery | Admitting: Surgery

## 2021-10-20 ENCOUNTER — Ambulatory Visit (HOSPITAL_BASED_OUTPATIENT_CLINIC_OR_DEPARTMENT_OTHER): Payer: 59 | Admitting: Anesthesiology

## 2021-10-20 ENCOUNTER — Encounter (HOSPITAL_COMMUNITY)
Admission: RE | Disposition: A | Discharge status: Home / Self Care | Payer: Self-pay | Source: Home / Self Care | Attending: Surgery

## 2021-10-20 DIAGNOSIS — R110 Nausea: Secondary | ICD-10-CM | POA: Insufficient documentation

## 2021-10-20 DIAGNOSIS — K429 Umbilical hernia without obstruction or gangrene: Secondary | ICD-10-CM | POA: Diagnosis not present

## 2021-10-20 DIAGNOSIS — K219 Gastro-esophageal reflux disease without esophagitis: Secondary | ICD-10-CM | POA: Diagnosis not present

## 2021-10-20 DIAGNOSIS — F419 Anxiety disorder, unspecified: Secondary | ICD-10-CM | POA: Diagnosis not present

## 2021-10-20 DIAGNOSIS — F32A Depression, unspecified: Secondary | ICD-10-CM | POA: Insufficient documentation

## 2021-10-20 DIAGNOSIS — K589 Irritable bowel syndrome without diarrhea: Secondary | ICD-10-CM | POA: Insufficient documentation

## 2021-10-20 DIAGNOSIS — R69 Illness, unspecified: Secondary | ICD-10-CM | POA: Diagnosis not present

## 2021-10-20 HISTORY — PX: UMBILICAL HERNIA REPAIR: SHX196

## 2021-10-20 SURGERY — REPAIR, HERNIA, UMBILICAL, ADULT
Anesthesia: General

## 2021-10-20 MED ORDER — FENTANYL CITRATE (PF) 100 MCG/2ML IJ SOLN
INTRAMUSCULAR | Status: AC
  Filled 2021-10-20: qty 2

## 2021-10-20 MED ORDER — MEPERIDINE HCL 50 MG/ML IJ SOLN: 6.2500 mg | INTRAMUSCULAR | Status: DC | PRN

## 2021-10-20 MED ORDER — DEXAMETHASONE SODIUM PHOSPHATE 4 MG/ML IJ SOLN
INTRAMUSCULAR | Status: DC | PRN
  Administered 2021-10-20: 10 mg via INTRAVENOUS

## 2021-10-20 MED ORDER — MIDAZOLAM HCL 2 MG/2ML IJ SOLN
INTRAMUSCULAR | Status: AC
  Filled 2021-10-20: qty 2

## 2021-10-20 MED ORDER — ROCURONIUM BROMIDE 10 MG/ML (PF) SYRINGE
PREFILLED_SYRINGE | INTRAVENOUS | Status: DC | PRN
  Administered 2021-10-20: 60 mg via INTRAVENOUS
  Administered 2021-10-20: 10 mg via INTRAVENOUS

## 2021-10-20 MED ORDER — CHLORHEXIDINE GLUCONATE CLOTH 2 % EX PADS: 6.0000 | MEDICATED_PAD | Freq: Once | CUTANEOUS | Status: DC

## 2021-10-20 MED ORDER — ACETAMINOPHEN 500 MG PO TABS: 1000.0000 mg | ORAL_TABLET | Freq: Four times a day (QID) | ORAL | 0 refills | Status: AC

## 2021-10-20 MED ORDER — OXYCODONE HCL 5 MG PO TABS: 5.0000 mg | ORAL_TABLET | Freq: Four times a day (QID) | ORAL | 0 refills | Status: AC | PRN

## 2021-10-20 MED ORDER — CEFAZOLIN SODIUM-DEXTROSE 2-4 GM/100ML-% IV SOLN
INTRAVENOUS | Status: AC
Start: 2021-10-20 — End: 2021-10-20
  Filled 2021-10-20: qty 100

## 2021-10-20 MED ORDER — ONDANSETRON HCL 4 MG/2ML IJ SOLN
INTRAMUSCULAR | Status: AC
  Filled 2021-10-20: qty 2

## 2021-10-20 MED ORDER — DEXAMETHASONE SODIUM PHOSPHATE 10 MG/ML IJ SOLN
INTRAMUSCULAR | Status: AC
  Filled 2021-10-20: qty 1

## 2021-10-20 MED ORDER — ONDANSETRON HCL 4 MG/2ML IJ SOLN
4.0000 mg | Freq: Once | INTRAMUSCULAR | Status: AC | PRN
  Administered 2021-10-20: 4 mg via INTRAVENOUS
  Filled 2021-10-20: qty 2

## 2021-10-20 MED ORDER — DOCUSATE SODIUM 100 MG PO CAPS: 100.0000 mg | ORAL_CAPSULE | Freq: Two times a day (BID) | ORAL | 2 refills | Status: DC

## 2021-10-20 MED ORDER — FENTANYL CITRATE (PF) 100 MCG/2ML IJ SOLN
INTRAMUSCULAR | Status: DC | PRN
  Administered 2021-10-20: 100 ug via INTRAVENOUS

## 2021-10-20 MED ORDER — ROCURONIUM BROMIDE 10 MG/ML (PF) SYRINGE
PREFILLED_SYRINGE | INTRAVENOUS | Status: AC
  Filled 2021-10-20: qty 10

## 2021-10-20 MED ORDER — HYDROMORPHONE HCL 1 MG/ML IJ SOLN
0.2500 mg | INTRAMUSCULAR | Status: DC | PRN
  Administered 2021-10-20 (×3): 0.5 mg via INTRAVENOUS
  Filled 2021-10-20 (×3): qty 0.5

## 2021-10-20 MED ORDER — PROPOFOL 10 MG/ML IV BOLUS
INTRAVENOUS | Status: DC | PRN
  Administered 2021-10-20: 200 mg via INTRAVENOUS

## 2021-10-20 MED ORDER — SODIUM CHLORIDE 0.9 % IR SOLN
Status: DC | PRN
  Administered 2021-10-20: 1000 mL

## 2021-10-20 MED ORDER — CHLORHEXIDINE GLUCONATE 0.12 % MT SOLN
OROMUCOSAL | Status: AC
  Filled 2021-10-20: qty 15

## 2021-10-20 MED ORDER — CHLORHEXIDINE GLUCONATE 0.12 % MT SOLN
15.0000 mL | Freq: Once | OROMUCOSAL | Status: AC
  Administered 2021-10-20: 15 mL via OROMUCOSAL

## 2021-10-20 MED ORDER — MIDAZOLAM HCL 5 MG/5ML IJ SOLN
INTRAMUSCULAR | Status: DC | PRN
  Administered 2021-10-20: 2 mg via INTRAVENOUS

## 2021-10-20 MED ORDER — SUGAMMADEX SODIUM 200 MG/2ML IV SOLN
INTRAVENOUS | Status: DC | PRN
  Administered 2021-10-20: 200 mg via INTRAVENOUS

## 2021-10-20 MED ORDER — LIDOCAINE HCL (PF) 2 % IJ SOLN
INTRAMUSCULAR | Status: AC
  Filled 2021-10-20: qty 5

## 2021-10-20 MED ORDER — LACTATED RINGERS IV SOLN
INTRAVENOUS | Status: DC
  Administered 2021-10-20: 1000 mL via INTRAVENOUS

## 2021-10-20 MED ORDER — CEFAZOLIN SODIUM-DEXTROSE 2-4 GM/100ML-% IV SOLN
2.0000 g | INTRAVENOUS | Status: AC
  Administered 2021-10-20: 2 g via INTRAVENOUS

## 2021-10-20 MED ORDER — BUPIVACAINE LIPOSOME 1.3 % IJ SUSP
INTRAMUSCULAR | Status: DC | PRN
  Administered 2021-10-20: 20 mL

## 2021-10-20 MED ORDER — LIDOCAINE HCL (CARDIAC) PF 100 MG/5ML IV SOSY
PREFILLED_SYRINGE | INTRAVENOUS | Status: DC | PRN
  Administered 2021-10-20: 60 mg via INTRAVENOUS

## 2021-10-20 MED ORDER — ORAL CARE MOUTH RINSE: 15.0000 mL | Freq: Once | OROMUCOSAL | Status: AC

## 2021-10-20 MED ORDER — ONDANSETRON HCL 4 MG/2ML IJ SOLN
INTRAMUSCULAR | Status: DC | PRN
  Administered 2021-10-20: 4 mg via INTRAVENOUS

## 2021-10-20 SURGICAL SUPPLY — 43 items
ADH SKN CLS APL DERMABOND .7 (GAUZE/BANDAGES/DRESSINGS) ×1
APL PRP STRL LF DISP 70% ISPRP (MISCELLANEOUS) ×1
BINDER ABDOMINAL  9 SM 30-45 (SOFTGOODS) ×1
BINDER ABDOMINAL 9 SM 30-45 (SOFTGOODS) IMPLANT
BLADE SURG 15 STRL LF DISP TIS (BLADE) ×1 IMPLANT
BLADE SURG 15 STRL SS (BLADE) ×2
CHLORAPREP W/TINT 26 (MISCELLANEOUS) ×2 IMPLANT
CLOTH BEACON ORANGE TIMEOUT ST (SAFETY) ×2 IMPLANT
COVER LIGHT HANDLE STERIS (MISCELLANEOUS) ×4 IMPLANT
DERMABOND ADVANCED (GAUZE/BANDAGES/DRESSINGS) ×1
DERMABOND ADVANCED .7 DNX12 (GAUZE/BANDAGES/DRESSINGS) ×1 IMPLANT
DRSG TEGADERM 4X4.75 (GAUZE/BANDAGES/DRESSINGS) ×1 IMPLANT
ELECT REM PT RETURN 9FT ADLT (ELECTROSURGICAL) ×2
ELECTRODE REM PT RTRN 9FT ADLT (ELECTROSURGICAL) ×1 IMPLANT
GAUZE 4X4 16PLY ~~LOC~~+RFID DBL (SPONGE) ×2 IMPLANT
GAUZE SPONGE 4X4 12PLY STRL (GAUZE/BANDAGES/DRESSINGS) ×1 IMPLANT
GLOVE BIOGEL PI IND STRL 6.5 (GLOVE) ×1 IMPLANT
GLOVE BIOGEL PI IND STRL 7.0 (GLOVE) ×2 IMPLANT
GLOVE BIOGEL PI INDICATOR 6.5 (GLOVE) ×2
GLOVE BIOGEL PI INDICATOR 7.0 (GLOVE) ×1
GLOVE SURG SS PI 6.5 STRL IVOR (GLOVE) ×4 IMPLANT
GOWN STRL REUS W/TWL LRG LVL3 (GOWN DISPOSABLE) ×4 IMPLANT
INST SET MINOR GENERAL (KITS) ×2 IMPLANT
KIT TURNOVER KIT A (KITS) ×2 IMPLANT
MANIFOLD NEPTUNE II (INSTRUMENTS) ×2 IMPLANT
MESH VENTRALEX ST 2.5 CRC MED (Mesh General) ×1 IMPLANT
NDL HYPO 18GX1.5 BLUNT FILL (NEEDLE) ×1 IMPLANT
NDL HYPO 21X1.5 SAFETY (NEEDLE) ×1 IMPLANT
NDL HYPO 25X1 1.5 SAFETY (NEEDLE) ×1 IMPLANT
NEEDLE HYPO 18GX1.5 BLUNT FILL (NEEDLE) ×2 IMPLANT
NEEDLE HYPO 21X1.5 SAFETY (NEEDLE) ×2 IMPLANT
NEEDLE HYPO 25X1 1.5 SAFETY (NEEDLE) ×2 IMPLANT
NS IRRIG 1000ML POUR BTL (IV SOLUTION) ×2 IMPLANT
PACK MINOR (CUSTOM PROCEDURE TRAY) ×2 IMPLANT
PAD ARMBOARD 7.5X6 YLW CONV (MISCELLANEOUS) ×2 IMPLANT
PENCIL SMOKE EVACUATOR (MISCELLANEOUS) ×2 IMPLANT
SET BASIN LINEN APH (SET/KITS/TRAYS/PACK) ×2 IMPLANT
SPONGE GAUZE 2X2 8PLY STRL LF (GAUZE/BANDAGES/DRESSINGS) ×1 IMPLANT
SUT ETHIBOND 0 MO6 C/R (SUTURE) ×3 IMPLANT
SUT MNCRL AB 4-0 PS2 18 (SUTURE) ×2 IMPLANT
SUT VIC AB 3-0 SH 27 (SUTURE) ×2
SUT VIC AB 3-0 SH 27X BRD (SUTURE) ×1 IMPLANT
SYR 20ML LL LF (SYRINGE) ×4 IMPLANT

## 2021-10-20 NOTE — Discharge Instructions (Signed)
Ambulatory Surgery Discharge Instructions  General Anesthesia or Sedation Do not drive or operate heavy machinery for 24 hours.  Do not consume alcohol, tranquilizers, sleeping medications, or any non-prescribed medications for 24 hours. Do not make important decisions or sign any important papers in the next 24 hours. You should have someone with you tonight at home.  Activity  You are advised to go directly home from the hospital.  Restrict your activities and rest for a day.  Resume light activity tomorrow. No heavy lifting over 10 lbs or strenuous exercise.  Wear your abdominal binder at all times except while sleeping and bathing.  Fluids and Diet Begin with clear liquids, bouillon, dry toast, soda crackers.  If not nauseated, you may go to a regular diet when you desire.  Greasy and spicy foods are not advised.  Medications  If you have not had a bowel movement in 24 hours, take 2 tablespoons over the counter Milk of mag.             You May resume your blood thinners tomorrow (Aspirin, coumadin, or other).  You are being discharged with prescriptions for Opioid/Narcotic Medications: There are some specific considerations for these medications that you should know. Opioid Meds have risks & benefits. Addiction to these meds is always a concern with prolonged use Take medication only as directed Do not drive while taking narcotic pain medication Do not crush tablets or capsules Do not use a different container than medication was dispensed in Lock the container of medication in a cool, dry place out of reach of children and pets. Opioid medication can cause addiction Do not share with anyone else (this is a felony) Do not store medications for future use. Dispose of them properly.     Disposal:  Find a [employer] household drug take back site near you.  If you can't get to a drug take back site, use the recipe below as a last resort to dispose of expired, unused or unwanted  drugs. Disposal  (Do not dispose chemotherapy drugs this way, talk to your prescribing doctor instead.) Step 1: Mix drugs (do not crush) with dirt, kitty litter, or used coffee grounds and add a small amount of water to dissolve any solid medications. Step 2: Seal drugs in plastic bag. Step 3: Place plastic bag in trash. Step 4: Take prescription container and scratch out personal information, then recycle or throw away.  Operative Site  You have an external dressing.  This can be removed in 2 days or if saturated.  May shower over the dressing. You have a liquid bandage over your incisions, this will begin to flake off in about a week. Ok to Games developer. Keep wound clean and dry. No baths or swimming. No lifting more than 10 pounds.  Contact Information: If you have questions or concerns, please call our office, [phone], Monday- Thursday 8AM-5PM and Friday 8AM-12Noon.  If it is after hours or on the weekend, please call [facility], [phone], and ask to speak to the surgeon on call for Dr. Okey Dupre at [facility].   SPECIFIC COMPLICATIONS TO WATCH FOR: Inability to urinate Fever over 101? F by mouth Nausea and vomiting lasting longer than 24 hours. Pain not relieved by medication ordered Swelling around the operative site Increased redness, warmth, hardness, around operative area Numbness, tingling, or cold fingers or toes Blood -soaked dressing, (small amounts of oozing may be normal) Increasing and progressive drainage from surgical area or exam site

## 2021-10-20 NOTE — Op Note (Signed)
[facility] Operative Note  10/20/21  Preoperative Diagnosis: Umbilical hernia    Postoperative Diagnosis: Same   Procedure(s) Performed: Open umbilical hernia repair with mesh   Surgeon: Graciella Freer, DO   Assistants: No qualified resident was available    Anesthesia: General endotracheal   Anesthesiologist: Denese Killings, MD    Specimens: hernia sac   Estimated Blood Loss: Minimal   Blood Replacement: None    Complications: None   Wound Class: Clean   Operative Indications: Patient is a 36 year old female who presents for open umbilical hernia repair with mesh.  She has had this hernia for 2 months, and it has been causing her pain, so she would like it repaired at this time.  All risks, benefits, and alternatives to open umbilical hernia repair with mesh were discussed with the patient, all of her questions were answered to her expressed satisfaction. The patient expresses she wishes to proceed, and informed consent was obtained.   Findings: 3 x 2 cm umbilical hernia   Procedure: The patient was taken to the operating room and placed supine. General endotracheal anesthesia was induced. Intravenous antibiotics were administered per protocol.  The abdomen was prepared and draped in the usual sterile fashion.   The umbilical hernia was noted to be reducible and measured about 3 cm. An incision was made under the umbilicus, and carried down through the subcutaneous tissue with electrocautery.  Dissection was performed down to the level of the fascia, exposing the hernia sac.  The hernia sac was opened with care, and excess hernia sac was resected with electrocautery.  The hernia sac was sent to pathology for evaluation.  The hernia measured 3 x 2 cm.  A finger was ran on the underlying peritoneum and this was clear.  A 6.4 cm Ventralex St Hernia Patch was placed and secured with 0 Ethibond sutures ensuring that it was against the peritoneal cavity.   The hernia defect was then closed with 0 Ethibond suture in an interrupted fashion over the patch.  The umbilicus was tacked to the fascia with a 3-0 Vicryl suture.   Hemostasis was confirmed. The skin was closed with a running 4-0 Monocryl suture and dermabond.  After the dermabond dried, a 4 x 4 and tegaderm were placed over the umbilicus to act as a pressure dressing.    All counts were correct at the end of the case. The patient was awakened from anesthesia and extubated without complication.  The patient went to the PACU in stable condition.  Graciella Freer, DO [facility] [address] [place], [place] [address] [phone] (office)

## 2021-10-20 NOTE — Anesthesia Preprocedure Evaluation (Signed)
Anesthesia Evaluation  Patient identified by MRN, date of birth, ID band Patient awake    Reviewed: Allergy & Precautions, NPO status , Patient's Chart, lab work & pertinent test results  Airway Mallampati: II  TM Distance: >3 FB Neck ROM: Full    Dental  (+) Dental Advisory Given, Missing   Pulmonary neg pulmonary ROS,    Pulmonary exam normal breath sounds clear to auscultation       Cardiovascular negative cardio ROS Normal cardiovascular exam Rhythm:Regular Rate:Normal     Neuro/Psych  Headaches, PSYCHIATRIC DISORDERS Anxiety Depression    GI/Hepatic Neg liver ROS, PUD, GERD  Medicated and Controlled,  Endo/Other  negative endocrine ROS  Renal/GU negative Renal ROS  negative genitourinary   Musculoskeletal negative musculoskeletal ROS (+)   Abdominal   Peds negative pediatric ROS (+) ATTENTION 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 Hematology  (+) Blood dyscrasia, anemia ,   Anesthesia Other Findings   Reproductive/Obstetrics negative OB ROS                            Anesthesia Physical Anesthesia Plan  ASA: 2  Anesthesia Plan: General   Post-op Pain Management: Dilaudid IV   Induction: Intravenous  PONV Risk Score and Plan: 4 or greater and Ondansetron, Dexamethasone and Midazolam  Airway Management Planned: Oral ETT  Additional Equipment:   Intra-op Plan:   Post-operative Plan: Extubation in OR  Informed Consent: I have reviewed the patients History and Physical, chart, labs and discussed the procedure including the risks, benefits and alternatives for the proposed anesthesia with the patient or authorized representative who has indicated his/her understanding and acceptance.     Dental advisory given  Plan Discussed with: CRNA and Surgeon  Anesthesia Plan Comments:         Anesthesia Quick Evaluation

## 2021-10-20 NOTE — Progress Notes (Signed)
Update Note:  Spoke with the patient's boyfriend, Berneda Rose, on the phone.  I explained that her surgery went well.  She had a 3 cm hernia defect, so I placed a mesh.  She has dissolvable stitches under the skin with overlying dermabond.  The dermbaond will flake off in 10-14 days.  She has been discharged with Roxicodone, that she should take as needed for pain.  She should take colace while taking narcotic pain medications.  She should also take Tylenol scheduled for the next 5-7 days.  She has an external dressing that may be removed in 2 days or sooner if saturated.  She also has an abdominal binder that she should wear at all times except while bathing and sleeping for the next week.  She will follow up with me in 2 weeks.  All questions answered to his expressed satisfaction.  Graciella Freer, DO [facility] [address] [place], [place] [address] [phone] (office)

## 2021-10-20 NOTE — Interval H&P Note (Signed)
History and Physical Interval Note:  10/20/2021 7:24 AM  Brittney Farmer  has presented today for surgery, with the diagnosis of UMBILICAL HERNIA < 3 CM.  The various methods of treatment have been discussed with the patient and family. After consideration of risks, benefits and other options for treatment, the patient has consented to  Procedure(s): HERNIA REPAIR UMBILICAL ADULT W/ MESH (N/A) as a surgical intervention.  The patient's history has been reviewed, patient examined, no change in status, stable for surgery.  I have reviewed the patient's chart and labs.  Questions were answered to the patient's satisfaction.     [place]

## 2021-10-20 NOTE — Anesthesia Postprocedure Evaluation (Signed)
Anesthesia Post Note  Patient: Brittney Farmer  Procedure(s) Performed: HERNIA REPAIR UMBILICAL ADULT W/ MESH  Patient location during evaluation: Phase II Anesthesia Type: General Level of consciousness: awake and alert and oriented Pain management: pain level controlled Vital Signs Assessment: post-procedure vital signs reviewed and stable Respiratory status: spontaneous breathing, nonlabored ventilation and respiratory function stable Cardiovascular status: blood pressure returned to baseline and stable Postop Assessment: no apparent nausea or vomiting Anesthetic complications: no   No notable events documented.   Last Vitals:  Vitals:   10/20/21 1000 10/20/21 1016  BP: (!) 117/59 119/65  Pulse: 77 76  Resp: 15 14  Temp:  36.7 C  SpO2: 94% 95%    Last Pain:  Vitals:   10/20/21 1016  TempSrc: Oral  PainSc: 5                  Amardeep Beckers C Adriene Knipfer

## 2021-10-20 NOTE — Anesthesia Procedure Notes (Signed)
Procedure Name: Intubation Date/Time: 10/20/2021 7:35 AM  Performed by: Lieutenant Diego, CRNAPre-anesthesia Checklist: Patient identified, Emergency Drugs available, Suction available and Patient being monitored Patient Re-evaluated:Patient Re-evaluated prior to induction Oxygen Delivery Method: Circle system utilized Preoxygenation: Pre-oxygenation with 100% oxygen Induction Type: IV induction Ventilation: Mask ventilation without difficulty Laryngoscope Size: Miller and 2 Grade View: Grade I Tube type: Oral Tube size: 7.0 mm Number of attempts: 1 Airway Equipment and Method: Stylet and Oral airway Placement Confirmation: ETT inserted through vocal cords under direct vision, positive ETCO2 and breath sounds checked- equal and bilateral Secured at: 21 cm Tube secured with: Tape Dental Injury: Teeth and Oropharynx as per pre-operative assessment

## 2021-10-20 NOTE — Transfer of Care (Signed)
Immediate Anesthesia Transfer of Care Note  Patient: Brittney Farmer  Procedure(s) Performed: HERNIA REPAIR UMBILICAL ADULT W/ MESH  Patient Location: PACU  Anesthesia Type:General  Level of Consciousness: awake  Airway & Oxygen Therapy: Patient Spontanous Breathing and Patient connected to nasal cannula oxygen  Post-op Assessment: Report given to RN and Post -op Vital signs reviewed and stable  Post vital signs: Reviewed and stable  Last Vitals:  Vitals Value Taken Time  BP 129/70 10/20/21 0911  Temp 37.1 C 10/20/21 0911  Pulse 81 10/20/21 0914  Resp 25 10/20/21 0914  SpO2 99 % 10/20/21 0914  Vitals shown include unvalidated device data.  Last Pain:  Vitals:   10/20/21 0642  TempSrc: Oral  PainSc: 0-No pain      Patients Stated Pain Goal: 7 (38/37/79 3968)  Complications: No notable events documented.

## 2021-10-21 LAB — SURGICAL PATHOLOGY

## 2021-10-25 ENCOUNTER — Encounter (HOSPITAL_COMMUNITY): Payer: Self-pay | Admitting: Surgery

## 2021-11-04 ENCOUNTER — Encounter: Payer: Self-pay | Admitting: Surgery

## 2021-11-04 ENCOUNTER — Ambulatory Visit (INDEPENDENT_AMBULATORY_CARE_PROVIDER_SITE_OTHER): Payer: 59 | Admitting: Surgery

## 2021-11-04 ENCOUNTER — Telehealth: Payer: Self-pay | Admitting: *Deleted

## 2021-11-04 VITALS — BP 122/83 | HR 75 | Temp 98.4°F | Resp 14 | Ht 63.0 in | Wt 226.0 lb

## 2021-11-04 DIAGNOSIS — Z09 Encounter for follow-up examination after completed treatment for conditions other than malignant neoplasm: Secondary | ICD-10-CM

## 2021-11-04 NOTE — Telephone Encounter (Signed)
Received STD forms from [employer]~ [phone]~ telephone/ [phone]~ fax.  Surgical Date: 15/52/0802 Procedure: Umbilical Hernia Repair W/ Mesh  Job Duties: Owner/ [employer]  Requested Beginning Date: 10/20/2021 Return to Work Date: 11/22/2021  Faxed to [facility] and received confirmation.

## 2021-11-04 NOTE — Progress Notes (Signed)
[facility] Note   HPI:  36 y.o. Female presents to clinic for post-op follow-up s/p open umbilical hernia repair with mesh on 6/21. Patient reports that she has been doing well postoperatively.  She initially had some pain, but now she only occasionally has some twinges of pain.  She has been tolerating a diet.  She does confirm occasional episodes of nausea without episodes of vomiting.  She is moving her bowels without issue.  Her incision is healing well without difficulty.  She denies fevers and chills.  Review of Systems:  All other review of systems: otherwise negative   Vital Signs:  BP 122/83   Pulse 75   Temp 98.4 F (36.9 C) (Oral)   Resp 14   Ht '5\' 3"'$  (1.6 m)   Wt 226 lb (102.5 kg)   LMP 09/30/2021   SpO2 98%   BMI 40.03 kg/m    Physical Exam:  Physical Exam Vitals reviewed.  Constitutional:      Appearance: Normal appearance.  Abdominal:     Comments: Abdomen soft, nondistended, no percussion tenderness, nontender to palpation; no rigidity, guarding, rebound tenderness, umbilical incision healing well   Neurological:     Mental Status: She is alert.    Laboratory studies: None  Imaging:  None  Assessment:  36 y.o. yo Female who presents for follow-up status post open umbilical hernia repair with mesh on the right 6/21  Plan:  -Patient doing well postoperatively -Advised her that occasional twinges of pain are to be expected, but able continue to improve with time -Continue light activity for the next 2 weeks for total of 4 weeks postoperatively -Work note provided to excuse the patient for the past 2 weeks, and advised her to start working in an additional 2 weeks -Follow up as needed  All of the above recommendations were discussed with the patient, and all of patient's questions were answered to her expressed satisfaction.  Graciella Freer, DO [facility] [address] [place], [place]  [phone] [phone] (office)

## 2021-11-08 ENCOUNTER — Ambulatory Visit (INDEPENDENT_AMBULATORY_CARE_PROVIDER_SITE_OTHER): Payer: 59 | Admitting: Family

## 2021-11-08 ENCOUNTER — Encounter: Payer: Self-pay | Admitting: Family

## 2021-11-08 ENCOUNTER — Other Ambulatory Visit: Payer: Self-pay | Admitting: *Deleted

## 2021-11-08 ENCOUNTER — Inpatient Hospital Stay (INDEPENDENT_AMBULATORY_CARE_PROVIDER_SITE_OTHER): Payer: 59

## 2021-11-08 VITALS — BP 116/76 | HR 71 | Temp 98.6°F | Ht 63.0 in | Wt 230.0 lb

## 2021-11-08 DIAGNOSIS — R69 Illness, unspecified: Secondary | ICD-10-CM | POA: Diagnosis not present

## 2021-11-08 DIAGNOSIS — F411 Generalized anxiety disorder: Secondary | ICD-10-CM | POA: Diagnosis not present

## 2021-11-08 DIAGNOSIS — R002 Palpitations: Secondary | ICD-10-CM | POA: Diagnosis not present

## 2021-11-08 DIAGNOSIS — F331 Major depressive disorder, recurrent, moderate: Secondary | ICD-10-CM | POA: Diagnosis not present

## 2021-11-08 MED ORDER — FLUOXETINE HCL 20 MG PO TABS: 20.0000 mg | ORAL_TABLET | Freq: Every day | ORAL | 3 refills | Status: DC

## 2021-11-08 NOTE — Progress Notes (Signed)
Subjective:    Patient ID: Brittney Farmer, female    DOB: 06-16-1985, 36 y.o.   MRN: [id number]  Chief Complaint  Patient presents with   Medical Management of Chronic Issues   Pt presents to the office today for GAD and depression. She was started on Prozac 10 mg. States this has helped, but would like to try to increase dose.   States she is having increased heart rate at times with palpitations with SOB. States it will last 1-2 mins and then go back. She does not know of any triggers and does not feel like it is related to her anxiety.  Anxiety Presents for follow-up visit. Symptoms include depressed mood, excessive worry, irritability, nervous/anxious behavior, palpitations, restlessness and shortness of breath. Patient reports no nausea or obsessions. Symptoms occur occasionally. The severity of symptoms is moderate. The quality of sleep is good.    Depression        This is a chronic problem.  The current episode started more than 1 year ago.   The onset quality is gradual.   The problem occurs intermittently.  Associated symptoms include irritable, restlessness and sad.  Associated symptoms include no helplessness and no hopelessness.  Past treatments include SSRIs - Selective serotonin reuptake inhibitors.  Past medical history includes anxiety.   Palpitations  This is a new problem. The current episode started 1 to 4 weeks ago. The problem occurs every several days. The problem has been unchanged. On average, each episode lasts 2 minutes. Nothing aggravates the symptoms. Associated symptoms include anxiety, an irregular heartbeat and shortness of breath. Pertinent negatives include no nausea, syncope or vomiting. She has tried deep relaxation for the symptoms. The treatment provided mild relief.      Review of Systems  Constitutional:  Positive for irritability.  Respiratory:  Positive for shortness of breath.   Cardiovascular:  Positive for palpitations. Negative for syncope.   Gastrointestinal:  Negative for nausea and vomiting.  Psychiatric/Behavioral:  Positive for depression. The patient is nervous/anxious.   All other systems reviewed and are negative.      Objective:   Physical Exam Vitals reviewed.  Constitutional:      General: She is irritable. She is not in acute distress.    Appearance: She is well-developed. She is obese.  HENT:     Head: Normocephalic and atraumatic.     Right Ear: Tympanic membrane normal.     Left Ear: Tympanic membrane normal.  Eyes:     Pupils: Pupils are equal, round, and reactive to light.  Neck:     Thyroid: No thyromegaly.  Cardiovascular:     Rate and Rhythm: Normal rate and regular rhythm.     Heart sounds: Normal heart sounds. No murmur heard. Pulmonary:     Effort: Pulmonary effort is normal. No respiratory distress.     Breath sounds: Normal breath sounds. No wheezing.  Abdominal:     General: Bowel sounds are normal. There is no distension.     Palpations: Abdomen is soft.     Tenderness: There is no abdominal tenderness.  Musculoskeletal:        General: No tenderness. Normal range of motion.     Cervical back: Normal range of motion and neck supple.  Skin:    General: Skin is warm and dry.  Neurological:     Mental Status: She is alert and oriented to person, place, and time.     Cranial Nerves: No cranial nerve deficit.  Deep Tendon Reflexes: Reflexes are normal and symmetric.  Psychiatric:        Behavior: Behavior normal.        Thought Content: Thought content normal.        Judgment: Judgment normal.      BP 116/76   Pulse 71   Temp 98.6 F (37 C)   Ht _0  (1.6 m)   Wt 230 lb (104.3 kg)   LMP 10/29/2021 (Approximate)   SpO2 98%   BMI 40.74 kg/m      Assessment & Plan:  Brittney Farmer comes in today with chief complaint of Medical Management of Chronic Issues   Diagnosis and orders addressed:  1. Palpitations - EKG 12-Lead - LONG TERM MONITOR (3-14 DAYS) -  BMP8+EGFR - CBC with Differential/Platelet - TSH  2. GAD (generalized anxiety disorder) Will increase Prozac to 20 mg from 10 mg Stress management  Follow up in 2 months  - FLUoxetine (PROZAC) 20 MG tablet; Take 1 tablet (20 mg total) by mouth daily.  Dispense: 90 tablet; Refill: 3  3. Moderate episode of recurrent major depressive disorder (HCC) - FLUoxetine (PROZAC) 20 MG tablet; Take 1 tablet (20 mg total) by mouth daily.  Dispense: 90 tablet; Refill: 3   Labs pending Health Maintenance reviewed Diet and exercise encouraged  Follow up plan: 2 months    Evelina Dun, FNP

## 2021-11-08 NOTE — Patient Instructions (Signed)
Palpitations Palpitations are feelings that your heartbeat is irregular or is faster than normal. It may feel like your heart is fluttering or skipping a beat. Palpitations may be caused by many things, including smoking, caffeine, alcohol, stress, and certain medicines or drugs. Most causes of palpitations are not serious.  However, some palpitations can be a sign of a serious problem. Further tests and a thorough medical history will be done to find the cause of your palpitations. Your provider may order tests such as an ECG, labs, an echocardiogram, or an ambulatory continuous ECG monitor. Follow these instructions at home: Pay attention to any changes in your symptoms. Let your health care provider know about them. Take these actions to help manage your symptoms: Eating and drinking Follow instructions from your health care provider about eating or drinking restrictions. You may need to avoid foods and drinks that may cause palpitations. These may include: Caffeinated coffee, tea, soft drinks, and energy drinks. Chocolate. Alcohol. Diet pills. Lifestyle     Take steps to reduce your stress and anxiety. Things that can help you relax include: Yoga. Mind-body activities, such as deep breathing, meditation, or using words and images to create positive thoughts (guided imagery). Physical activity, such as swimming, jogging, or walking. Tell your health care provider if your palpitations increase with activity. If you have chest pain or shortness of breath with activity, do not continue the activity until you are seen by your health care provider. Biofeedback. This is a method that helps you learn to use your mind to control things in your body, such as your heartbeat. Get plenty of rest and sleep. Keep a regular bed time. Do not use drugs, including cocaine or ecstasy. Do not use marijuana. Do not use any products that contain nicotine or tobacco. These products include cigarettes, chewing  tobacco, and vaping devices, such as e-cigarettes. If you need help quitting, ask your health care provider. General instructions Take over-the-counter and prescription medicines only as told by your health care provider. Keep all follow-up visits. This is important. These may include visits for further testing if palpitations do not go away or get worse. Contact a health care provider if: You continue to have a fast or irregular heartbeat for a long period of time. You notice that your palpitations occur more often. Get help right away if: You have chest pain or shortness of breath. You have a severe headache. You feel dizzy or you faint. These symptoms may represent a serious problem that is an emergency. Do not wait to see if the symptoms will go away. Get medical help right away. Call your local emergency services (911 in the [place] Do not drive yourself to the hospital. Summary Palpitations are feelings that your heartbeat is irregular or is faster than normal. It may feel like your heart is fluttering or skipping a beat. Palpitations may be caused by many things, including smoking, caffeine, alcohol, stress, certain medicines, and drugs. Further tests and a thorough medical history may be done to find the cause of your palpitations. Get help right away if you faint or have chest pain, shortness of breath, severe headache, or dizziness. This information is not intended to replace advice given to you by your health care provider. Make sure you discuss any questions you have with your health care provider. Document Revised: 09/09/2020 Document Reviewed: 09/09/2020 Elsevier Patient Education  2023 [employer]  

## 2021-11-08 NOTE — Addendum Note (Signed)
Addended by: Antonietta Barcelona D on: 11/08/2021 02:57 PM   Modules accepted: Orders

## 2021-11-08 NOTE — Addendum Note (Signed)
Addended by: Antonietta Barcelona D on: 11/08/2021 02:59 PM   Modules accepted: Orders

## 2021-11-08 NOTE — Addendum Note (Signed)
Addended by: Evelina Dun A on: 11/08/2021 02:47 PM   Modules accepted: Orders

## 2021-11-09 LAB — CBC WITH DIFFERENTIAL/PLATELET
Basophils Absolute: 0.1 10*3/uL (ref 0.0–0.2)
Basos: 1 %
EOS (ABSOLUTE): 0.2 10*3/uL (ref 0.0–0.4)
Eos: 3 %
Hematocrit: 39 % (ref 34.0–46.6)
Hemoglobin: 13.1 g/dL (ref 11.1–15.9)
Immature Grans (Abs): 0 10*3/uL (ref 0.0–0.1)
Immature Granulocytes: 0 %
Lymphocytes Absolute: 2.3 10*3/uL (ref 0.7–3.1)
Lymphs: 26 %
MCH: 29.5 pg (ref 26.6–33.0)
MCHC: 33.6 g/dL (ref 31.5–35.7)
MCV: 88 fL (ref 79–97)
Monocytes Absolute: 0.5 10*3/uL (ref 0.1–0.9)
Monocytes: 6 %
Neutrophils Absolute: 5.6 10*3/uL (ref 1.4–7.0)
Neutrophils: 64 %
Platelets: 340 10*3/uL (ref 150–450)
RBC: 4.44 x10E6/uL (ref 3.77–5.28)
RDW: 12.9 % (ref 11.7–15.4)
WBC: 8.7 10*3/uL (ref 3.4–10.8)

## 2021-11-09 LAB — TSH: TSH: 1.4 u[IU]/mL (ref 0.450–4.500)

## 2021-11-09 LAB — BMP8+EGFR
BUN/Creatinine Ratio: 16 (ref 9–23)
BUN: 10 mg/dL (ref 6–20)
CO2: 26 mmol/L (ref 20–29)
Calcium: 9.4 mg/dL (ref 8.7–10.2)
Chloride: 103 mmol/L (ref 96–106)
Creatinine, Ser: 0.63 mg/dL (ref 0.57–1.00)
Glucose: 87 mg/dL (ref 70–99)
Potassium: 4.3 mmol/L (ref 3.5–5.2)
Sodium: 141 mmol/L (ref 134–144)
eGFR: 119 mL/min/{1.73_m2} (ref >59)

## 2021-11-11 ENCOUNTER — Encounter (INDEPENDENT_AMBULATORY_CARE_PROVIDER_SITE_OTHER): Payer: Self-pay | Admitting: Family Medicine

## 2021-11-11 ENCOUNTER — Ambulatory Visit (INDEPENDENT_AMBULATORY_CARE_PROVIDER_SITE_OTHER): Payer: 59 | Admitting: Family Medicine

## 2021-11-11 VITALS — BP 116/71 | HR 73 | Temp 97.7°F | Ht 63.0 in | Wt 221.0 lb

## 2021-11-11 DIAGNOSIS — E8881 Metabolic syndrome: Secondary | ICD-10-CM

## 2021-11-11 DIAGNOSIS — Z6839 Body mass index (BMI) 39.0-39.9, adult: Secondary | ICD-10-CM

## 2021-11-11 DIAGNOSIS — E669 Obesity, unspecified: Secondary | ICD-10-CM | POA: Diagnosis not present

## 2021-11-11 DIAGNOSIS — E538 Deficiency of other specified B group vitamins: Secondary | ICD-10-CM

## 2021-11-11 DIAGNOSIS — R0602 Shortness of breath: Secondary | ICD-10-CM

## 2021-11-11 DIAGNOSIS — E7849 Other hyperlipidemia: Secondary | ICD-10-CM | POA: Diagnosis not present

## 2021-11-11 DIAGNOSIS — E559 Vitamin D deficiency, unspecified: Secondary | ICD-10-CM

## 2021-11-11 DIAGNOSIS — E88819 Insulin resistance, unspecified: Secondary | ICD-10-CM | POA: Insufficient documentation

## 2021-11-11 MED ORDER — METFORMIN HCL 500 MG PO TABS: ORAL_TABLET | ORAL | 0 refills | Status: DC

## 2021-11-12 LAB — LIPID PANEL
Chol/HDL Ratio: 3.8 ratio (ref 0.0–4.4)
Cholesterol, Total: 217 mg/dL — ABNORMAL HIGH (ref 100–199)
HDL: 57 mg/dL (ref >39)
LDL Chol Calc (NIH): 148 mg/dL — ABNORMAL HIGH (ref 0–99)
Triglycerides: 69 mg/dL (ref 0–149)
VLDL Cholesterol Cal: 12 mg/dL (ref 5–40)

## 2021-11-12 LAB — INSULIN, RANDOM: INSULIN: 12.5 u[IU]/mL (ref 2.6–24.9)

## 2021-11-12 LAB — HEMOGLOBIN A1C
Est. average glucose Bld gHb Est-mCnc: 103 mg/dL
Hgb A1c MFr Bld: 5.2 % (ref 4.8–5.6)

## 2021-11-12 LAB — VITAMIN B12: Vitamin B-12: 785 pg/mL (ref 232–1245)

## 2021-11-12 LAB — VITAMIN D 25 HYDROXY (VIT D DEFICIENCY, FRACTURES): Vit D, 25-Hydroxy: 41.2 ng/mL (ref 30.0–100.0)

## 2021-11-15 NOTE — Progress Notes (Signed)
Chief Complaint:   OBESITY Brittney Farmer is here to discuss her progress with her obesity treatment plan along with follow-up of her obesity related diagnoses. Brittney Farmer is on the Category 2 Plan and states she is following her eating plan approximately 50% of the time. Brittney Farmer states she is walking 10-15 minutes 3-4 times per week.  Today's visit was #: 21 Starting weight: 235 lbs Starting date: 02/20/2020 Today's weight: 221 lbs Today's date: 11/11/2021 Total lbs lost to date: 14 lbs Total lbs lost since last in-office visit: 2 lbs  Interim History: Brittney Farmer has been feeling more hungry lately.  She has been eating off plan more.  She has more likely been following the plan 20-30%.  She has some increased stress in her life.   Subjective:   1. SOB (shortness of breath) on exertion [name] notes increasing shortness of breath with exercising and seems to be worsening over time with weight gain. She notes getting out of breath sooner with activity than she used to. This has not gotten worse recently. [name] denies shortness of breath at rest or orthopnea.  2. Vitamin D deficiency She is currently taking prescription vitamin D 50,000 IU each week. She denies nausea, vomiting or muscle weakness.  3. B12 deficiency Brittney Farmer is taking 1000 mcg OTC.  4. Insulin resistance Goal is HgbA1c < 5.7, fasting insulin closer to 5.   Brittney Farmer is having increased cravings for sweets.  She is currently taking Topamax.   5. Other hyperlipidemia Brittney Farmer is not taking any medications at this time.  Patient denies myalgias.   The ASCVD Risk score (Arnett DK, et al., 2019) failed to calculate for the following reasons:   The 2019 ASCVD risk score is only valid for ages 71 to 69    Assessment/Plan:   Orders Placed This Encounter  Procedures   Vitamin B12   VITAMIN D 25 Hydroxy (Vit-D Deficiency, Fractures)   Lipid panel   Insulin, random   Hemoglobin A1c    There are no discontinued  medications.   Meds ordered this encounter  Medications   metFORMIN (GLUCOPHAGE) 500 MG tablet    Sig: 1 po with lunch daily    Dispense:  30 tablet    Refill:  0    30 d supply;  ** OV for RF **   Do not send RF request     1. SOB (shortness of breath) on exertion Brittney Farmer does not know feel that she gets out of breath more easily that she used to when she exercises. Brittney Farmer's shortness of breath appears to be obesity related and exercise induced. She has agreed to work on weight loss and gradually increase exercise to treat her exercise induced shortness of breath. Will continue to monitor closely.  Repeat IC today.  RMR was 1489 and now today is RMR is 1958. Excellent improvement due to increased protein and calorie intake.  Change from Category 2 to Category 3.  Check labs today.  2. Vitamin D deficiency Low Vitamin D level contributes to fatigue and are associated with obesity, breast, and colon cancer. She agrees to continue to take prescription Vitamin D '@50'$ ,000 IU every week and will follow-up for routine testing of Vitamin D, at least 2-3 times per year to avoid over-replacement.  Check lab- VITAMIN D 25 Hydroxy (Vit-D Deficiency, Fractures)  3. B12 deficiency Check lab- Vitamin B12  4. Insulin resistance Brittney Farmer will continue to work on weight loss, exercise, and decreasing simple carbohydrates to help decrease the  risk of diabetes. [name] agreed to follow-up with [place] as directed to closely monitor her progress.  Handouts on Metformin given to Brittney Farmer and desires to start it.  It was discussed at last office visit.  Check labs today.   - Insulin, random - Hemoglobin A1c  Refill- metFORMIN (GLUCOPHAGE) 500 MG tablet; 1 po with lunch daily  Dispense: 30 tablet; Refill: 0  5. Other hyperlipidemia Cardiovascular risk and specific lipid/LDL goals reviewed.  We discussed several lifestyle modifications today and Brittney Farmer will continue to work on diet, exercise and weight loss  efforts. Orders and follow up as documented in patient record.   Counseling Intensive lifestyle modifications are the first line treatment for this issue. Dietary changes: Increase soluble fiber. Decrease simple carbohydrates. Exercise changes: Moderate to vigorous-intensity aerobic activity 150 minutes per week if tolerated. Lipid-lowering medications: see documented in medical record. Check labs today.  - Lipid panel  6. Obesity with current BMI of 39.3 Recommend Bare chicken nuggets instead of regular chicken nuggets. Brittney Farmer is currently in the action stage of change. As such, her goal is to continue with weight loss efforts. She has agreed to the Category 3 Plan with 200 snack calories.   Exercise goals:  As is.   Behavioral modification strategies: increasing lean protein intake and decreasing simple carbohydrates.  Brittney Farmer has agreed to follow-up with our clinic in 3-4 weeks. She was informed of the importance of frequent follow-up visits to maximize her success with intensive lifestyle modifications for her multiple health conditions.   Objective:   Blood pressure 116/71, pulse 73, temperature 97.7 F (36.5 C), height '5\' 3"'$  (1.6 m), weight 221 lb (100.2 kg), last menstrual period 10/29/2021, SpO2 100 %. Body mass index is 39.15 kg/m.  General: Cooperative, alert, well developed, in no acute distress. HEENT: Conjunctivae and lids unremarkable. Cardiovascular: Regular rhythm.  Lungs: Normal work of breathing. Neurologic: No focal deficits.   Lab Results  Component Value Date   CREATININE 0.63 11/08/2021   BUN 10 11/08/2021   NA 141 11/08/2021   K 4.3 11/08/2021   CL 103 11/08/2021   CO2 26 11/08/2021   Lab Results  Component Value Date   ALT 8 05/12/2021   AST 7 05/12/2021   ALKPHOS 90 05/12/2021   BILITOT 0.5 05/12/2021   Lab Results  Component Value Date   HGBA1C 5.2 11/11/2021   HGBA1C 5.1 02/20/2020   Lab Results  Component Value Date   INSULIN 12.5  11/11/2021   INSULIN 5.2 02/20/2020   Lab Results  Component Value Date   TSH 1.400 11/08/2021   Lab Results  Component Value Date   CHOL 217 (H) 11/11/2021   HDL 57 11/11/2021   LDLCALC 148 (H) 11/11/2021   TRIG 69 11/11/2021   CHOLHDL 3.8 11/11/2021   Lab Results  Component Value Date   VD25OH 41.2 11/11/2021   VD25OH 40.6 05/12/2021   VD25OH 69.1 09/01/2020   Lab Results  Component Value Date   WBC 8.7 11/08/2021   HGB 13.1 11/08/2021   HCT 39.0 11/08/2021   MCV 88 11/08/2021   PLT 340 11/08/2021   Lab Results  Component Value Date   IRON 38 02/20/2020   TIBC 284 02/20/2020   FERRITIN 103 02/20/2020    Attestation Statements:   Reviewed by clinician on day of visit: allergies, medications, problem list, medical history, surgical history, family history, social history, and previous encounter notes.  Time spent on visit including pre-visit chart review and post-visit care and charting  was 40 minutes.   I, Davy Pique, RMA, am acting as Location manager for [employer], DO.  I have reviewed the above documentation for accuracy and completeness, and I agree with the above. Marjory Sneddon, D.O.  The [place] was signed into law in 2016 which includes the topic of electronic health records.  This provides immediate access to information in MyChart.  This includes consultation notes, operative notes, office notes, lab results and pathology reports.  If you have any questions about what you read please let us know at your next visit so we can discuss your concerns and take corrective action if need be.  We are right here with you.

## 2021-12-02 ENCOUNTER — Other Ambulatory Visit: Payer: Self-pay | Admitting: Family

## 2021-12-02 DIAGNOSIS — R002 Palpitations: Secondary | ICD-10-CM | POA: Diagnosis not present

## 2021-12-02 DIAGNOSIS — I471 Supraventricular tachycardia: Secondary | ICD-10-CM

## 2021-12-02 NOTE — Progress Notes (Signed)
Patient aware and verbalized understanding. °

## 2021-12-02 NOTE — Progress Notes (Signed)
SVT noted on monitor. Referral to Cardiologists placed.

## 2021-12-08 ENCOUNTER — Encounter (INDEPENDENT_AMBULATORY_CARE_PROVIDER_SITE_OTHER): Payer: Self-pay

## 2021-12-11 ENCOUNTER — Other Ambulatory Visit (INDEPENDENT_AMBULATORY_CARE_PROVIDER_SITE_OTHER): Payer: Self-pay | Admitting: Family Medicine

## 2021-12-11 DIAGNOSIS — E8881 Metabolic syndrome: Secondary | ICD-10-CM

## 2021-12-21 ENCOUNTER — Encounter (INDEPENDENT_AMBULATORY_CARE_PROVIDER_SITE_OTHER): Payer: Self-pay | Admitting: Family Medicine

## 2021-12-21 ENCOUNTER — Ambulatory Visit (INDEPENDENT_AMBULATORY_CARE_PROVIDER_SITE_OTHER): Payer: 59 | Admitting: Family Medicine

## 2021-12-21 VITALS — BP 110/71 | HR 82 | Temp 98.2°F | Ht 63.0 in | Wt 224.0 lb

## 2021-12-21 DIAGNOSIS — Z6839 Body mass index (BMI) 39.0-39.9, adult: Secondary | ICD-10-CM | POA: Diagnosis not present

## 2021-12-21 DIAGNOSIS — E559 Vitamin D deficiency, unspecified: Secondary | ICD-10-CM

## 2021-12-21 DIAGNOSIS — E8881 Metabolic syndrome: Secondary | ICD-10-CM | POA: Diagnosis not present

## 2021-12-21 DIAGNOSIS — E669 Obesity, unspecified: Secondary | ICD-10-CM | POA: Diagnosis not present

## 2021-12-21 MED ORDER — VITAMIN D (ERGOCALCIFEROL) 1.25 MG (50000 UNIT) PO CAPS: 50000.0000 [IU] | ORAL_CAPSULE | ORAL | 0 refills | Status: DC

## 2021-12-21 MED ORDER — METFORMIN HCL 1000 MG PO TABS: ORAL_TABLET | ORAL | 0 refills | Status: DC

## 2021-12-29 ENCOUNTER — Encounter: Payer: Self-pay | Admitting: Cardiology

## 2021-12-29 ENCOUNTER — Ambulatory Visit: Payer: 59 | Attending: Cardiology | Admitting: Cardiology

## 2021-12-29 VITALS — BP 122/78 | HR 56 | Ht 63.0 in | Wt 232.4 lb

## 2021-12-29 DIAGNOSIS — R079 Chest pain, unspecified: Secondary | ICD-10-CM

## 2021-12-29 DIAGNOSIS — I471 Supraventricular tachycardia: Secondary | ICD-10-CM | POA: Diagnosis not present

## 2021-12-29 DIAGNOSIS — R0602 Shortness of breath: Secondary | ICD-10-CM | POA: Diagnosis not present

## 2021-12-29 MED ORDER — PINDOLOL 5 MG PO TABS: 2.5000 mg | ORAL_TABLET | Freq: Two times a day (BID) | ORAL | 3 refills | Status: DC

## 2021-12-29 NOTE — Addendum Note (Signed)
Addended by: Antonieta Iba on: 12/29/2021 10:31 AM   Modules accepted: Orders

## 2021-12-29 NOTE — Progress Notes (Addendum)
Cardiology CONSULT Note    Date:  12/29/2021   ID:  Brittney Farmer, DOB 1985/05/13, MRN [id number]  PCP:  Brittney Balloon, FNP  Cardiologist:  Brittney Him, MD   Chief Complaint  Patient presents with   New Patient (Initial Visit)    SVT    History of Present Illness:  Brittney Farmer is a 36 y.o. female who is being seen today for the evaluation of SVT at the request of Brittney Balloon, FNP.  This is a 36 year old female with a history of ADHD, anxiety, depression, GERD and palpitations. She was seen by her PCP on 11/08/2021 with complaints of palpitations.  She wore an event monitor which revealed an average heart rate of 84 bpm with 5 episodes of SVT lasting up to 7 beats in a row with a maximum heart rate of 197 bpm, occasional PACs, atrial couplets and triplets and isolated PVCs.  She is now here for further evaluation.  She occasionally has a feeling of someone sitting on her chest that is nonexertional and deep breaths makes it go away.  It usually occurs more at rest and she does have issues with GERD. Occasionally she will also notice DOE that is very sporadic and again occurs at rest. She denies any PND orthopnea, lower extremity edema or syncope.  EKG done 7/23 was normal.   Past Medical History:  Diagnosis Date   ADD (attention deficit disorder)    ADHD    Anemia    Anxiety    Back pain    Constipation    Depression    GERD (gastroesophageal reflux disease)    IBS (irritable bowel syndrome)    IBS (irritable bowel syndrome)    Palpitations    SOB (shortness of breath) on exertion    Spine curvature, acquired    Stomach ulcer     Past Surgical History:  Procedure Laterality Date   BIOPSY  07/27/2020   Procedure: BIOPSY;  Surgeon: Eloise Harman, DO;  Location: [facility];  Service: Endoscopy;;   COLONOSCOPY WITH PROPOFOL N/A 07/27/2020   Procedure: COLONOSCOPY WITH PROPOFOL;  Surgeon: Eloise Harman, DO;  Location: [facility];  Service:  Endoscopy;  Laterality: N/A;  PM   ESOPHAGOGASTRODUODENOSCOPY (EGD) WITH PROPOFOL N/A 07/27/2020   Procedure: ESOPHAGOGASTRODUODENOSCOPY (EGD) WITH PROPOFOL;  Surgeon: Eloise Harman, DO;  Location: [facility];  Service: Endoscopy;  Laterality: N/A;   HAND SURGERY Left 09/08/2016   [place]   TUBAL LIGATION  10/93/2355   UMBILICAL HERNIA REPAIR N/A 10/20/2021   Procedure: HERNIA REPAIR UMBILICAL ADULT W/ MESH;  Surgeon: Rusty Aus, DO;  Location: [facility];  Service: General;  Laterality: N/A;   VAGINAL DELIVERY  03/13/2019   WISDOM TOOTH EXTRACTION      Current Medications: Current Meds  Medication Sig   buPROPion (WELLBUTRIN XL) 150 MG 24 hr tablet Take 1 tablet (150 mg total) by mouth daily.   busPIRone (BUSPAR) 5 MG tablet Take 1 tablet (5 mg total) by mouth 3 (three) times daily. Patients insurance has changed and she will update you all with new insurance (Patient taking differently: Take 5 mg by mouth 2 (two) times daily as needed (anxiety).)   dicyclomine (BENTYL) 10 MG capsule TAKE 1 CAPSULE BY MOUTH 4 TIMES DAILY BEFORE MEALS AND AT BEDTIME FOR ABDOMINAL PAIN (Patient taking differently: Take 10 mg by mouth 4 (four) times daily as needed for spasms.)   FLUoxetine (PROZAC) 20 MG tablet Take  1 tablet (20 mg total) by mouth daily.   fluticasone (FLONASE) 50 MCG/ACT nasal spray Place 2 sprays into both nostrils daily.   loratadine (CLARITIN) 10 MG tablet Take 1 tablet (10 mg total) by mouth daily.   metFORMIN (GLUCOPHAGE) 1000 MG tablet 1 po with lunch daily   Multiple Vitamins-Minerals (WOMENS MULTIVITAMIN PO) Take 1 tablet by mouth daily.   omeprazole (PRILOSEC) 40 MG capsule Take 1 capsule (40 mg total) by mouth daily.   ondansetron (ZOFRAN) 4 MG tablet Take 1 tablet (4 mg total) by mouth every 8 (eight) hours as needed for nausea or vomiting. TAKE 1 TABLET BY MOUTH EVERY 8 HOURS AS NEEDED FOR NAUSEA AND VOMITING   topiramate (TOPAMAX) 50 MG tablet TAKE 1 TABLET BY MOUTH  TWICE A DAY   vitamin B-12 (CYANOCOBALAMIN) 1000 MCG tablet Take 1,000 mcg by mouth daily.   VITAMIN D, CHOLECALCIFEROL, PO Take by mouth. Unknown dosage   Vitamin D, Ergocalciferol, (DRISDOL) 1.25 MG (50000 UNIT) CAPS capsule Take 1 capsule (50,000 Units total) by mouth every 7 (seven) days.    Allergies:   Latex   Social History   Socioeconomic History   Marital status: Single    Spouse name: Brittney Farmer   Number of children: 4   Years of education: Not on file   Highest education level: Not on file  Occupational History   Occupation: Network engineer  Tobacco Use   Smoking status: Never   Smokeless tobacco: Never  Vaping Use   Vaping Use: Never used  Substance and Sexual Activity   Alcohol use: No   Drug use: No   Sexual activity: Yes  Other Topics Concern   Not on file  Social History Narrative   Not on file   Social Determinants of Health   Financial Resource Strain: Low Risk  (11/22/2017)   Overall Financial Resource Strain (CARDIA)    Difficulty of Paying Living Expenses: Not hard at all  Food Insecurity: No Food Insecurity (11/22/2017)   Hunger Vital Sign    Worried About Running Out of Food in the Last Year: Never true    [place] in the Last Year: Never true  Transportation Needs: No Transportation Needs (11/22/2017)   PRAPARE - Hydrologist (Medical): No    Lack of Transportation (Non-Medical): No  Physical Activity: Inactive (11/22/2017)   Exercise Vital Sign    Days of Exercise per Week: 0 days    Minutes of Exercise per Session: 0 min  Stress: No Stress Concern Present (11/22/2017)   [place]    Feeling of Stress : Not at all  Social Connections: Somewhat Isolated (11/22/2017)   Social Connection and Isolation Panel [NHANES]    Frequency of Communication with Friends and Family: More than three times a week    Frequency of Social Gatherings with Friends and  Family: More than three times a week    Attends Religious Services: Never    Marine scientist or Organizations: No    Attends Archivist Meetings: Never    Marital Status: Married     Family History:  The patient's family history includes Alcoholism in her father and mother; Anxiety disorder in her mother; Depression in her mother; Diabetes in her mother; Heart disease in her mother; Hyperlipidemia in her father and mother; Hypertension in her father and mother.   ROS:   Please see the history of present illness.  ROS All other systems reviewed and are negative.      No data to display             PHYSICAL EXAM:   VS:  BP 122/78   Pulse (!) 56   Ht '5\' 3"'$  (1.6 m)   Wt 232 lb 6.4 oz (105.4 kg)   SpO2 95%   BMI 41.17 kg/m    GEN: Well nourished, well developed, in no acute distress  HEENT: normal  Neck: no JVD, carotid bruits, or masses Cardiac: RRR; no murmurs, rubs, or gallops,no edema.  Intact distal pulses bilaterally.  Respiratory:  clear to auscultation bilaterally, normal work of breathing GI: soft, nontender, nondistended, + BS MS: no deformity or atrophy  Skin: warm and dry, no rash Neuro:  Alert and Oriented x 3, Strength and sensation are intact Psych: euthymic mood, full affect  Wt Readings from Last 3 Encounters:  12/29/21 232 lb 6.4 oz (105.4 kg)  12/21/21 224 lb (101.6 kg)  11/11/21 221 lb (100.2 kg)      Studies/Labs Reviewed:   EKG:  EKG is not ordered today.    Recent Labs: 05/12/2021: ALT 8 11/08/2021: BUN 10; Creatinine, Ser 0.63; Hemoglobin 13.1; Platelets 340; Potassium 4.3; Sodium 141; TSH 1.400   Lipid Panel    Component Value Date/Time   CHOL 217 (H) 11/11/2021 1044   TRIG 69 11/11/2021 1044   HDL 57 11/11/2021 1044   CHOLHDL 3.8 11/11/2021 1044   LDLCALC 148 (H) 11/11/2021 1044     CHA2DS2-VASc Score =     This indicates a  % annual risk of stroke. The patient's score is based upon:              Additional studies/ records that were reviewed today include:  Office visit notes from PCP    ASSESSMENT:    1. SVT (supraventricular tachycardia) (HCC)   2. Chest pain of uncertain etiology   3. SOB (shortness of breath)      PLAN:  In order of problems listed above:  SVT/Palpitations -She has had several episodes of palpitations and recently saw her PCP and had an event monitor placed.  This showed 5 episodes of SVT with the longest lasting 7 beats with the fastest heart rate 197 bpm. -Recommend starting beta-blocker for suppression of arrhythmias but given her heart rate of 57 to 62 bpm we will use a beta-blocker with intrinsic sympathomimetic activity -Start pindolol 2.5 mg twice daily>> she will let me know if she has any breakthrough palpitations on this dose -Recent potassium and TSH were normal -Check 2D echo to assess LV function -encouraged her to cut out caffeine  2.  Chest pain/SOB -very atypical and occurs mainly at rest>>she has GERD and suspect this is GI related -EKG is nonischemic -I will get an ETT and 2D echo to assess further -Shared Decision Making/Informed Consent The risks [chest pain, shortness of breath, cardiac arrhythmias, dizziness, blood pressure fluctuations, myocardial infarction, stroke/transient ischemic attack, and life-threatening complications (estimated to be 1 in 10,000)], benefits (risk stratification, diagnosing coronary artery disease, treatment guidance) and alternatives of an exercise tolerance test were discussed in detail with Ms. Schiltz and she agrees to proceed.  Followup with PA in 4weeks and with me in 1 year    Time Spent: 20 minutes total time of encounter, including 15 minutes spent in face-to-face patient care on the date of this encounter. This time includes coordination of care and counseling regarding above mentioned problem list. Remainder  of non-face-to-face time involved reviewing chart documents/testing relevant to  the patient encounter and documentation in the medical record. I have independently reviewed documentation from referring provider  Medication Adjustments/Labs and Tests Ordered: Current medicines are reviewed at length with the patient today.  Concerns regarding medicines are outlined above.  Medication changes, Labs and Tests ordered today are listed in the Patient Instructions below.  There are no Patient Instructions on file for this visit.   Signed, Brittney Him, MD  12/29/2021 10:21 AM    [facility] [address], [place], [place]  [address] Phone: [phone]; Fax: [phone]

## 2021-12-29 NOTE — Progress Notes (Unsigned)
Chief Complaint:   OBESITY Brittney Farmer is here to discuss her progress with her obesity treatment plan along with follow-up of her obesity related diagnoses. Brittney Farmer is on the Category 3 Plan with 200 calories and states she is following her eating plan approximately 0% of the time. Brittney Farmer states she is walking for 15-20 minutes 3-4 times per week.  Today's visit was #: 22 Starting weight: 235 lbs Starting date: 02/20/2020 Today's weight: 224 lbs Today's date: 12/29/2021 Total lbs lost to date: 11 Total lbs lost since last in-office visit: 0  Interim History: Brittney Farmer struggled to follow a structured plan. She notes increased sweet cravings and her hunger is still a problem.   Subjective:   1. Insulin resistance Brittney Farmer has been on Ozempic before and lost weight, but she has gained back more weight.  She was started on metformin with minimal benefits but no side effects either.  2. Vitamin D deficiency Brittney Farmer's vitamin D level is not yet at goal, and she notes fatigue.  Assessment/Plan:   1. Insulin resistance Brittney Farmer agreed to increase metformin to 1000 mg once daily with lunch, with no refills.  - metFORMIN (GLUCOPHAGE) 1000 MG tablet; 1 po with lunch daily  Dispense: 30 tablet; Refill: 0  2. Vitamin D deficiency Brittney Farmer will continue prescription vitamin D 50,000 units once weekly, and we will refill for 1 month.  - Vitamin D, Ergocalciferol, (DRISDOL) 1.25 MG (50000 UNIT) CAPS capsule; Take 1 capsule (50,000 Units total) by mouth every 7 (seven) days.  Dispense: 5 capsule; Refill: 0  3. Obesity, Current BMI 39.8 Brittney Farmer is currently in the action stage of change. As such, her goal is to continue with weight loss efforts. She has agreed to change to keeping a food journal and adhering to recommended goals of 1400-1500 calories and 85+ grams of protein daily.   Exercise goals: As is.   Behavioral modification strategies: increasing lean protein intake.  Brittney Farmer has  agreed to follow-up with our clinic in 3 weeks. She was informed of the importance of frequent follow-up visits to maximize her success with intensive lifestyle modifications for her multiple health conditions.   Objective:   Blood pressure 110/71, pulse 82, temperature 98.2 F (36.8 C), height '5\' 3"'$  (1.6 m), weight 224 lb (101.6 kg), SpO2 100 %. Body mass index is 39.68 kg/m.  General: Cooperative, alert, well developed, in no acute distress. HEENT: Conjunctivae and lids unremarkable. Cardiovascular: Regular rhythm.  Lungs: Normal work of breathing. Neurologic: No focal deficits.   Lab Results  Component Value Date   CREATININE 0.63 11/08/2021   BUN 10 11/08/2021   NA 141 11/08/2021   K 4.3 11/08/2021   CL 103 11/08/2021   CO2 26 11/08/2021   Lab Results  Component Value Date   ALT 8 05/12/2021   AST 7 05/12/2021   ALKPHOS 90 05/12/2021   BILITOT 0.5 05/12/2021   Lab Results  Component Value Date   HGBA1C 5.2 11/11/2021   HGBA1C 5.1 02/20/2020   Lab Results  Component Value Date   INSULIN 12.5 11/11/2021   INSULIN 5.2 02/20/2020   Lab Results  Component Value Date   TSH 1.400 11/08/2021   Lab Results  Component Value Date   CHOL 217 (H) 11/11/2021   HDL 57 11/11/2021   LDLCALC 148 (H) 11/11/2021   TRIG 69 11/11/2021   CHOLHDL 3.8 11/11/2021   Lab Results  Component Value Date   VD25OH 41.2 11/11/2021   VD25OH 40.6 05/12/2021  VD25OH 69.1 09/01/2020   Lab Results  Component Value Date   WBC 8.7 11/08/2021   HGB 13.1 11/08/2021   HCT 39.0 11/08/2021   MCV 88 11/08/2021   PLT 340 11/08/2021   Lab Results  Component Value Date   IRON 38 02/20/2020   TIBC 284 02/20/2020   FERRITIN 103 02/20/2020   Attestation Statements:   Reviewed by clinician on day of visit: allergies, medications, problem list, medical history, surgical history, family history, social history, and previous encounter notes.   I, Trixie Dredge, am acting as transcriptionist  for Dennard Nip, MD.  I have reviewed the above documentation for accuracy and completeness, and I agree with the above. -  Dennard Nip, MD

## 2021-12-29 NOTE — Addendum Note (Signed)
Addended by: Fransico Him R on: 12/29/2021 11:07 AM   Modules accepted: Orders

## 2021-12-29 NOTE — Patient Instructions (Addendum)
Medication Instructions:  Your physician has recommended you make the following change in your medication:  1) START taking pindolol 2.5 mg twice daily  *If you need a refill on your cardiac medications before your next appointment, please call your pharmacy*  Testing/Procedures: Your physician has requested that you have an echocardiogram. Echocardiography is a painless test that uses sound waves to create images of your heart. It provides your doctor with information about the size and shape of your heart and how well your heart's chambers and valves are working. This procedure takes approximately one hour. There are no restrictions for this procedure.  Your physician has requested that you have an exercise tolerance test. For further information please visit [email or web address]. Please also follow instruction sheet, as given.  Follow-Up: At [facility], you and your health needs are our priority.  As part of our continuing mission to provide you with exceptional heart care, we have created designated Provider Care Teams.  These Care Teams include your primary Cardiologist (physician) and Advanced Practice Providers (APPs -  Physician Assistants and Nurse Practitioners) who all work together to provide you with the care you need, when you need it.  Your next appointment:   4 weeks  The format for your next appointment:   In Person  Provider:   [facility]  Follow up with Fransico Him, MD  in one year  Important Information About Sugar

## 2022-01-11 ENCOUNTER — Ambulatory Visit (INDEPENDENT_AMBULATORY_CARE_PROVIDER_SITE_OTHER): Payer: 59 | Admitting: Family

## 2022-01-11 ENCOUNTER — Encounter: Payer: Self-pay | Admitting: Family

## 2022-01-11 VITALS — BP 114/73 | HR 80 | Temp 98.4°F | Ht 63.0 in | Wt 237.0 lb

## 2022-01-11 DIAGNOSIS — F39 Unspecified mood [affective] disorder: Secondary | ICD-10-CM

## 2022-01-11 DIAGNOSIS — F411 Generalized anxiety disorder: Secondary | ICD-10-CM | POA: Diagnosis not present

## 2022-01-11 DIAGNOSIS — K219 Gastro-esophageal reflux disease without esophagitis: Secondary | ICD-10-CM

## 2022-01-11 DIAGNOSIS — E538 Deficiency of other specified B group vitamins: Secondary | ICD-10-CM | POA: Diagnosis not present

## 2022-01-11 DIAGNOSIS — R69 Illness, unspecified: Secondary | ICD-10-CM | POA: Diagnosis not present

## 2022-01-11 DIAGNOSIS — E7849 Other hyperlipidemia: Secondary | ICD-10-CM

## 2022-01-11 DIAGNOSIS — E559 Vitamin D deficiency, unspecified: Secondary | ICD-10-CM | POA: Diagnosis not present

## 2022-01-11 DIAGNOSIS — R112 Nausea with vomiting, unspecified: Secondary | ICD-10-CM

## 2022-01-11 DIAGNOSIS — F331 Major depressive disorder, recurrent, moderate: Secondary | ICD-10-CM

## 2022-01-11 MED ORDER — OMEPRAZOLE 40 MG PO CPDR: 40.0000 mg | DELAYED_RELEASE_CAPSULE | Freq: Every day | ORAL | 2 refills | Status: DC

## 2022-01-11 MED ORDER — FLUOXETINE HCL 40 MG PO CAPS: 40.0000 mg | ORAL_CAPSULE | Freq: Every day | ORAL | 3 refills | Status: DC

## 2022-01-11 MED ORDER — ONDANSETRON HCL 4 MG PO TABS: 4.0000 mg | ORAL_TABLET | Freq: Three times a day (TID) | ORAL | 2 refills | Status: DC | PRN

## 2022-01-11 NOTE — Patient Instructions (Signed)
Fat and Cholesterol Restricted Eating Plan Eating a diet that limits fat and cholesterol may help lower your risk for heart disease and other conditions. Your body needs fat and cholesterol for basic functions, but eating too much of these things can be harmful to your health. Your health care provider may order lab tests to check your blood fat (lipid) and cholesterol levels. This helps your health care provider understand your risk for certain conditions and whether you need to make diet changes. Work with your health care provider or dietitian to make an eating plan that is right for you. Your plan includes: Limit your fat intake to ______% or less of your total calories a day. This is ______g of fat per day. Limit your saturated fat intake to ______% or less of your total calories a day. This is ______g of saturated fat per day. Limit the amount of cholesterol in your diet to less than _________mg a day. Eat ___________ g of fiber a day. What are tips for following this plan? General guidelines If you are overweight, work with your health care provider to lose weight safely. Losing just 5-10% of your body weight can improve your overall health and help prevent diseases such as diabetes and heart disease. Avoid: Foods with added sugar. Fried foods. Foods that contain partially hydrogenated oils, including stick margarine, some tub margarines, cookies, crackers, and other baked goods. If you drink alcohol: Limit how much you have to: 0-1 drink a day for women who are not pregnant. 0-2 drinks a day for men. Know how much alcohol is in a drink. In the [place] one drink equals one 12 oz bottle of beer (355 mL), one 5 oz glass of wine (148 mL), or one 1 oz glass of hard liquor (44 mL). Reading food labels Check food labels for: Trans fats or partially hydrogenated oils. Avoid foods that contain these. High amounts of saturated fat. Choose foods that are low in saturated fat (less than 2 g). The  amount of cholesterol in each serving. The amount of fiber in each serving. Choose foods with healthy fats, such as: Monounsaturated and polyunsaturated fats. These include olive and canola oil, flaxseeds, walnuts, almonds, and seeds. Omega-3 fats. These are found in foods such as salmon, mackerel, sardines, tuna, flaxseed oil, and ground flaxseeds. Choose grain products that have whole grains. Look for the word "whole" as the first word in the ingredient list. Cooking Cook foods using methods other than frying. Baking, boiling, grilling, and broiling are some healthy options. Eat more home-cooked food and less restaurant, buffet, and fast food. Avoid cooking using saturated fats. Animal sources of saturated fats include meats, butter, and cream. Plant sources of saturated fats include palm oil, palm kernel oil, and coconut oil. Meal planning  At meals, imagine dividing your plate into fourths: Fill one-half of your plate with vegetables, green salads, and fruit. Fill one-fourth of your plate with whole grains. Fill one-fourth of your plate with lean protein foods. Eat fish that is high in omega-3 fats at least two times a week. Eat more foods that contain fiber, such as whole grains, beans, apples, pears, berries, broccoli, carrots, peas, and barley. These foods help promote healthy cholesterol levels in the blood. What foods should I eat? Fruits All fresh, canned (in natural juice), or frozen fruits. Vegetables Fresh or frozen vegetables (raw, steamed, roasted, or grilled). Green salads. Grains Whole grains, such as whole wheat or whole grain breads, crackers, cereals, and pasta. Unsweetened oatmeal, bulgur,  barley, quinoa, or brown rice. Corn or whole wheat flour tortillas. Meats and other proteins Ground beef (85% or leaner), grass-fed beef, or beef trimmed of fat. Skinless chicken or [place]. Ground chicken or [place]. Pork trimmed of fat. All fish and seafood. Egg whites. Dried beans,  peas, or lentils. Unsalted nuts or seeds. Unsalted canned beans. Natural nut butters without added sugar and oil. Dairy Low-fat or nonfat dairy products, such as skim or 1% milk, 2% or reduced-fat cheeses, low-fat and fat-free ricotta or cottage cheese, or plain low-fat and nonfat yogurt. Fats and oils Tub margarine without trans fats. Light or reduced-fat mayonnaise and salad dressings. Avocado. Olive, canola, sesame, or safflower oils. The items listed above may not be a complete list of foods and beverages you can eat. Contact a dietitian for more information. What foods should I avoid? Fruits Canned fruit in heavy syrup. Fruit in cream or butter sauce. Fried fruit. Vegetables Vegetables cooked in cheese, cream, or butter sauce. Fried vegetables. Grains White bread. White pasta. White rice. Cornbread. Bagels, pastries, and croissants. Crackers and snack foods that contain trans fat and hydrogenated oils. Meats and other proteins Fatty cuts of meat. Ribs, chicken wings, bacon, sausage, bologna, salami, chitterlings, fatback, hot dogs, bratwurst, and packaged lunch meats. Liver and organ meats. Whole eggs and egg yolks. Chicken and [place] with skin. Fried meat. Dairy Whole or 2% milk, cream, half-and-half, and cream cheese. Whole milk cheeses. Whole-fat or sweetened yogurt. Full-fat cheeses. Nondairy creamers and whipped toppings. Processed cheese, cheese spreads, and cheese curds. Fats and oils Butter, stick margarine, lard, shortening, ghee, or bacon fat. Coconut, palm kernel, and palm oils. Beverages Alcohol. Sugar-sweetened drinks such as sodas, lemonade, and fruit drinks. Sweets and desserts Corn syrup, sugars, honey, and molasses. Candy. Jam and jelly. Syrup. Sweetened cereals. Cookies, pies, cakes, donuts, muffins, and ice cream. The items listed above may not be a complete list of foods and beverages you should avoid. Contact a dietitian for more information. Summary Your body needs  fat and cholesterol for basic functions. However, eating too much of these things can be harmful to your health. Work with your health care provider and dietitian to follow a diet that limits fat and cholesterol. Doing this may help lower your risk for heart disease and other conditions. Choose healthy fats, such as monounsaturated and polyunsaturated fats, and foods high in omega-3 fatty acids. Eat fiber-rich foods, such as whole grains, beans, peas, fruits, and vegetables. Limit or avoid alcohol, fried foods, and foods high in saturated fats, partially hydrogenated oils, and sugar. This information is not intended to replace advice given to you by your health care provider. Make sure you discuss any questions you have with your health care provider. Document Revised: 08/28/2020 Document Reviewed: 08/28/2020 Elsevier Patient Education  [employer].

## 2022-01-11 NOTE — Progress Notes (Signed)
Subjective:    Patient ID: Brittney Farmer, female    DOB: [birth year], 36 y.o.   MRN: [id number]  Chief Complaint  Patient presents with   Medical Management of Chronic Issues    Pt presents to the office today for chronic follow up.    She was going  healthy weight loss clinic. She is morbid obese with a BMI of 41 with depression, GAD, and hyperlipidemia.    She is followed by Cardiologists for  palpitations.  Gastroesophageal Reflux She complains of belching and heartburn. This is a chronic problem. The current episode started more than 1 year ago. The problem occurs occasionally. The problem has been waxing and waning. Associated symptoms include fatigue. Risk factors include obesity. She has tried a PPI for the symptoms. The treatment provided moderate relief.  Hyperlipidemia This is a chronic problem. The current episode started more than 1 year ago. The problem is uncontrolled. Exacerbating diseases include obesity. Current antihyperlipidemic treatment includes diet change. The current treatment provides no improvement of lipids. Risk factors for coronary artery disease include hypertension, a sedentary lifestyle and dyslipidemia.  Anxiety Presents for follow-up visit. Symptoms include depressed mood, excessive worry, irritability, nervous/anxious behavior and restlessness. Symptoms occur most days. The severity of symptoms is mild.   Her past medical history is significant for anemia.  Depression        This is a chronic problem.  The current episode started more than 1 year ago.   The onset quality is gradual.   The problem occurs intermittently.  Associated symptoms include fatigue, restlessness and sad.  Associated symptoms include no helplessness and no hopelessness.  Past treatments include SSRIs - Selective serotonin reuptake inhibitors.  Past medical history includes anxiety.   Anemia      Review of Systems  Constitutional:  Positive for fatigue and irritability.   Gastrointestinal:  Positive for heartburn.  Psychiatric/Behavioral:  Positive for depression. The patient is nervous/anxious.   All other systems reviewed and are negative.      Objective:   Physical Exam Vitals reviewed.  Constitutional:      General: She is not in acute distress.    Appearance: She is well-developed. She is obese.  HENT:     Head: Normocephalic and atraumatic.     Right Ear: Tympanic membrane normal.     Left Ear: Tympanic membrane normal.  Eyes:     Pupils: Pupils are equal, round, and reactive to light.  Neck:     Thyroid: No thyromegaly.  Cardiovascular:     Rate and Rhythm: Normal rate and regular rhythm.     Heart sounds: Normal heart sounds. No murmur heard. Pulmonary:     Effort: Pulmonary effort is normal. No respiratory distress.     Breath sounds: Normal breath sounds. No wheezing.  Abdominal:     General: Bowel sounds are normal. There is no distension.     Palpations: Abdomen is soft.     Tenderness: There is no abdominal tenderness.  Musculoskeletal:        General: No tenderness. Normal range of motion.     Cervical back: Normal range of motion and neck supple.  Skin:    General: Skin is warm and dry.  Neurological:     Mental Status: She is alert and oriented to person, place, and time.     Cranial Nerves: No cranial nerve deficit.     Deep Tendon Reflexes: Reflexes are normal and symmetric.  Psychiatric:  Behavior: Behavior normal.        Thought Content: Thought content normal.        Judgment: Judgment normal.       BP 114/73   Pulse 80   Temp 98.4 F (36.9 C) (Temporal)   Ht '5\' 3"'$  (1.6 m)   Wt 237 lb (107.5 kg)   SpO2 100%   BMI 41.98 kg/m      Assessment & Plan:  Brittney Farmer comes in today with chief complaint of Medical Management of Chronic Issues   Diagnosis and orders addressed:  1. Gastroesophageal reflux disease without esophagitis - omeprazole (PRILOSEC) 40 MG capsule; Take 1 capsule (40 mg  total) by mouth daily.  Dispense: 90 capsule; Refill: 2 - ondansetron (ZOFRAN) 4 MG tablet; Take 1 tablet (4 mg total) by mouth every 8 (eight) hours as needed for nausea or vomiting. TAKE 1 TABLET BY MOUTH EVERY 8 HOURS AS NEEDED FOR NAUSEA AND VOMITING  Dispense: 90 tablet; Refill: 2  2. Mood disorder with emotional eating - FLUoxetine (PROZAC) 40 MG capsule; Take 1 capsule (40 mg total) by mouth daily.  Dispense: 90 capsule; Refill: 3  3. Morbid obesity ([place])  4. GAD (generalized anxiety disorder) Will increase Prozac to 40 mg from 20 mg  -Diet discussed- Avoid fried, spicy, citrus foods, caffeine and alcohol -Do not eat 2-3 hours before bedtime -Encouraged small frequent meals -Avoid NSAID's - FLUoxetine (PROZAC) 40 MG capsule; Take 1 capsule (40 mg total) by mouth daily.  Dispense: 90 capsule; Refill: 3  5. B12 deficiency  6. Moderate episode of recurrent major depressive disorder ([place])  7. Vitamin D deficiency  8. Other hyperlipidemia  9. Nausea and vomiting - ondansetron (ZOFRAN) 4 MG tablet; Take 1 tablet (4 mg total) by mouth every 8 (eight) hours as needed for nausea or vomiting. TAKE 1 TABLET BY MOUTH EVERY 8 HOURS AS NEEDED FOR NAUSEA AND VOMITING  Dispense: 90 tablet; Refill: 2   Labs pending Health Maintenance reviewed Diet and exercise encouraged  Follow up plan: 6 months    Evelina Dun, FNP

## 2022-01-12 ENCOUNTER — Other Ambulatory Visit (INDEPENDENT_AMBULATORY_CARE_PROVIDER_SITE_OTHER): Payer: Self-pay | Admitting: Family Medicine

## 2022-01-12 DIAGNOSIS — E8881 Metabolic syndrome: Secondary | ICD-10-CM

## 2022-01-13 ENCOUNTER — Ambulatory Visit: Payer: 59 | Attending: Cardiology

## 2022-01-13 ENCOUNTER — Ambulatory Visit (INDEPENDENT_AMBULATORY_CARE_PROVIDER_SITE_OTHER): Payer: 59

## 2022-01-13 DIAGNOSIS — R0602 Shortness of breath: Secondary | ICD-10-CM

## 2022-01-13 DIAGNOSIS — R079 Chest pain, unspecified: Secondary | ICD-10-CM

## 2022-01-13 DIAGNOSIS — I471 Supraventricular tachycardia: Secondary | ICD-10-CM

## 2022-01-13 LAB — ECHOCARDIOGRAM COMPLETE
Area-P 1/2: 3.95 cm2
S' Lateral: 3.3 cm

## 2022-01-14 LAB — EXERCISE TOLERANCE TEST
Angina Index: 0
Duke Treadmill Score: 7
Estimated workload: 8.6
Exercise duration (min): 7 min
Exercise duration (sec): 3 s
MPHR: 186 {beats}/min
Peak HR: 173 {beats}/min
Percent HR: 93 %
Rest HR: 73 {beats}/min
ST Depression (mm): 0 mm

## 2022-01-18 ENCOUNTER — Encounter (INDEPENDENT_AMBULATORY_CARE_PROVIDER_SITE_OTHER): Payer: Self-pay | Admitting: Family Medicine

## 2022-01-18 ENCOUNTER — Ambulatory Visit (INDEPENDENT_AMBULATORY_CARE_PROVIDER_SITE_OTHER): Payer: 59 | Admitting: Family Medicine

## 2022-01-18 VITALS — BP 129/73 | HR 72 | Temp 97.7°F | Ht 63.0 in | Wt 230.0 lb

## 2022-01-18 DIAGNOSIS — E559 Vitamin D deficiency, unspecified: Secondary | ICD-10-CM

## 2022-01-18 DIAGNOSIS — Z6841 Body Mass Index (BMI) 40.0 and over, adult: Secondary | ICD-10-CM

## 2022-01-18 DIAGNOSIS — E8881 Metabolic syndrome: Secondary | ICD-10-CM

## 2022-01-18 DIAGNOSIS — Z9189 Other specified personal risk factors, not elsewhere classified: Secondary | ICD-10-CM

## 2022-01-18 DIAGNOSIS — E669 Obesity, unspecified: Secondary | ICD-10-CM

## 2022-01-18 MED ORDER — METFORMIN HCL 1000 MG PO TABS: ORAL_TABLET | ORAL | 0 refills | Status: DC

## 2022-01-18 MED ORDER — VITAMIN D (ERGOCALCIFEROL) 1.25 MG (50000 UNIT) PO CAPS: 50000.0000 [IU] | ORAL_CAPSULE | ORAL | 0 refills | Status: DC

## 2022-01-24 DIAGNOSIS — I471 Supraventricular tachycardia: Secondary | ICD-10-CM | POA: Insufficient documentation

## 2022-01-24 NOTE — Progress Notes (Unsigned)
Cardiology Office Note:    Date:  01/25/2022   ID:  Brittney Farmer, DOB 1985/10/27, MRN [id number]  PCP:  Sharion Balloon, [facility] Cardiologist:  Fransico Him, MD    Referring MD: Sharion Balloon, FNP   Chief Complaint:  F/u for palpitations     Patient Profile: Supraventricular Tachycardia  ADHD Anxiety/depression GERD  IBS  Prior CV Studies: EXERCISE TOLERANCE TEST (ETT) 01/13/2022   Negative EKG stress for ischemia.   Average exercise capacity (7:03 min:s; 8.6 METS). Normal HR/BP response to exercise.   ECHO COMPLETE WO IMAGING ENHANCING AGENT 01/13/2022 EF 60-65, no RWMA, normal RVSF, normal PASP (RVSP 16.1), trivial MR, RA pressure 3   LONG TERM MONITOR(3-7 DAYS) HOOK UP AND INTERPRETATION 12/02/2021 Patient had a min HR of 54 bpm, max HR of 197 bpm, and avg HR of 84 bpm. Predominant underlying rhythm was Sinus Rhythm. 5 Supraventricular Tachycardia runs occurred, the run with the fastest interval lasting 4 beats with a max rate of 197 bpm, the longest lasting 7 beats with an avg rate of 155 bpm.   History of Present Illness:   Brittney Farmer is a 36 y.o. female with the above problem list.  She established with Dr. Radford Pax in Aug 2023 for evaluation of palpitations and chest pain. Event monitor had shown brief episodes of Supraventricular Tachycardia. She was started on pindolol 2.5 mg once daily given her low basal heart rate. ETT and Echocardiogram were obtained to evaluate chest pain. Her ETT was normal and Echocardiogram showed normal LVF. She returns for f/u. She is here with her daughter. She overall feels better. She does still have symptoms at night. She typically has a sharp L sided chest pain assoc with this. She sometimes is awoken by fast heartbeats at night. She is taking her Pindolol at 8 am and 3 pm. She has not had exertional chest pain, shortness of breath, syncope.     Past Medical History:  Diagnosis Date   ADD (attention  deficit disorder)    ADHD    Anemia    Anxiety    Back pain    Constipation    Depression    GERD (gastroesophageal reflux disease)    IBS (irritable bowel syndrome)    IBS (irritable bowel syndrome)    Palpitations    SOB (shortness of breath) on exertion    Spine curvature, acquired    Stomach ulcer    Current Medications: Current Meds  Medication Sig   dicyclomine (BENTYL) 10 MG capsule TAKE 1 CAPSULE BY MOUTH 4 TIMES DAILY BEFORE MEALS AND AT BEDTIME FOR ABDOMINAL PAIN (Patient taking differently: Take 10 mg by mouth 4 (four) times daily as needed for spasms.)   FLUoxetine (PROZAC) 40 MG capsule Take 1 capsule (40 mg total) by mouth daily.   fluticasone (FLONASE) 50 MCG/ACT nasal spray Place 2 sprays into both nostrils daily.   loratadine (CLARITIN) 10 MG tablet Take 1 tablet (10 mg total) by mouth daily.   metFORMIN (GLUCOPHAGE) 1000 MG tablet 1/2 tab twice daily with meals   Multiple Vitamins-Minerals (WOMENS MULTIVITAMIN PO) Take 1 tablet by mouth daily.   omeprazole (PRILOSEC) 40 MG capsule Take 1 capsule (40 mg total) by mouth daily.   ondansetron (ZOFRAN) 4 MG tablet Take 1 tablet (4 mg total) by mouth every 8 (eight) hours as needed for nausea or vomiting. TAKE 1 TABLET BY MOUTH EVERY 8 HOURS AS NEEDED FOR NAUSEA AND VOMITING   pindolol (VISKEN)  5 MG tablet Take 0.5 tablets (2.5 mg total) by mouth 2 (two) times daily.   rizatriptan (MAXALT) 10 MG tablet TAKE 1 TABLET BY MOUTH AS NEEDED FOR MIGRAINE. MAY REPEAT IN 2 HOURS IF NEEDED   vitamin B-12 (CYANOCOBALAMIN) 1000 MCG tablet Take 1,000 mcg by mouth daily.   Vitamin D, Ergocalciferol, (DRISDOL) 1.25 MG (50000 UNIT) CAPS capsule Take 1 capsule (50,000 Units total) by mouth every 7 (seven) days.    Allergies:   Latex   Social History   Tobacco Use   Smoking status: Never   Smokeless tobacco: Never  Vaping Use   Vaping Use: Never used  Substance Use Topics   Alcohol use: No   Drug use: No    Family Hx: The  patient's family history includes Alcoholism in her father and mother; Anxiety disorder in her mother; Depression in her mother; Diabetes in her mother; Heart disease in her mother; Hyperlipidemia in her father and mother; Hypertension in her father and mother. There is no history of Celiac disease, Inflammatory bowel disease, or Colon cancer.  ROS   EKGs/Labs/Other Test Reviewed:    EKG:  EKG is not ordered today.  The ekg ordered today demonstrates n/a  Recent Labs: 05/12/2021: ALT 8 11/08/2021: BUN 10; Creatinine, Ser 0.63; Hemoglobin 13.1; Platelets 340; Potassium 4.3; Sodium 141; TSH 1.400   Recent Lipid Panel Recent Labs    11/11/21 1044  CHOL 217*  TRIG 69  HDL 57  LDLCALC 148*     Risk Assessment/Calculations/Metrics:              Physical Exam:    VS:  BP 116/68   Pulse 85   Ht '5\' 3"'$  (1.6 m)   Wt 236 lb (107 kg)   SpO2 99%   BMI 41.81 kg/m     Wt Readings from Last 3 Encounters:  01/25/22 236 lb (107 kg)  01/18/22 230 lb (104.3 kg)  01/11/22 237 lb (107.5 kg)    Constitutional:      Appearance: Healthy appearance. Not in distress.  Neck:     Thyroid: No thyromegaly.     Vascular: JVD normal.  Pulmonary:     Effort: Pulmonary effort is normal.     Breath sounds: No wheezing. No rales.  Cardiovascular:     Normal rate. Regular rhythm. Normal S1. Normal S2.      Murmurs: There is no murmur.  Edema:    Peripheral edema absent.  Abdominal:     Palpations: Abdomen is soft.  Skin:    General: Skin is warm and dry.  Neurological:     Mental Status: Alert and oriented to person, place and time.         ASSESSMENT & PLAN:   SVT (supraventricular tachycardia) (HCC) Overall her symptoms are improved. However, she is still having symptoms at night. She takes her 2nd dose of Pindolol around 3 pm. I have asked her to change her Pindolol to every 12 hours. We also discussed reducing caffeine intake. She had an energy drink recently and felt bad. I have  recommended that she avoid these. If she has continued symptoms with taking the pindolol q 12 hours, she will let us know. I would recommend increasing her dose of Pindolol to 2.5 mg in A and 5 mg in P vs 5 mg twice daily. As long as her symptoms are controlled, she can f/u with Dr. Radford Pax in Aug 2024.   Precordial chest pain She mainly has chest pain associated  with palpitations. Her recent echocardiogram showed normal LVEF and ETT was low risk. Adjust beta-blocker as noted. No further CV testing is needed at this time.             Dispo:  Return in about 11 months (around 12/26/2022) for Routine Follow Up w/ Dr. Radford Pax.   Medication Adjustments/Labs and Tests Ordered: Current medicines are reviewed at length with the patient today.  Concerns regarding medicines are outlined above.  Tests Ordered: No orders of the defined types were placed in this encounter.  Medication Changes: No orders of the defined types were placed in this encounter.  Signed, Richardson Dopp, PA-C  01/25/2022 11:08 AM    [facility] [place], [place], [place]  [address] Phone: [phone]; Fax: [phone]

## 2022-01-25 ENCOUNTER — Ambulatory Visit: Payer: 59 | Attending: Physician Assistant | Admitting: Physician Assistant

## 2022-01-25 ENCOUNTER — Encounter: Payer: Self-pay | Admitting: Physician Assistant

## 2022-01-25 VITALS — BP 116/68 | HR 85 | Ht 63.0 in | Wt 236.0 lb

## 2022-01-25 DIAGNOSIS — I471 Supraventricular tachycardia: Secondary | ICD-10-CM

## 2022-01-25 DIAGNOSIS — R072 Precordial pain: Secondary | ICD-10-CM | POA: Insufficient documentation

## 2022-01-25 NOTE — Progress Notes (Unsigned)
Chief Complaint:   OBESITY Brittney Farmer is here to discuss her progress with her obesity treatment plan along with follow-up of her obesity related diagnoses. Brittney Farmer is on keeping a food journal and adhering to recommended goals of 1400-1500 calories and 85+ grams protein and states she is following her eating plan approximately 50% of the time. [name] states she is walking 20-25 minutes 3-4 times per week.  Today's visit was #: 23 Starting weight: 235 lbs Starting date: 02/20/2020 Today's weight: 230 lbs Today's date: 01/18/2022 Total lbs lost to date: 5 Total lbs lost since last in-office visit: +6  Interim History: [name] changed to journaling last OV but has had no time to do so. We reviewed PC/[place] and pt did not want to do that and requested category 2 again.  Subjective:   1. Insulin resistance Brittney Farmer's fasting insulin was 12.5 on 11/11/2021, and she reports cravings.  2. Vitamin D deficiency Brittney Farmer's Vit D level was 41.2 on 11/11/2021.   3. At risk for deficient intake of food Brittney Farmer is at risk for deficient intake of food due to missing meals.  Assessment/Plan:  No orders of the defined types were placed in this encounter.   Medications Discontinued During This Encounter  Medication Reason   Vitamin D, Ergocalciferol, (DRISDOL) 1.25 MG (50000 UNIT) CAPS capsule Reorder   metFORMIN (GLUCOPHAGE) 1000 MG tablet    Vitamin D, Ergocalciferol, (DRISDOL) 1.25 MG (50000 UNIT) CAPS capsule Reorder   Vitamin D, Ergocalciferol, (DRISDOL) 1.25 MG (50000 UNIT) CAPS capsule    Vitamin D, Ergocalciferol, (DRISDOL) 1.25 MG (50000 UNIT) CAPS capsule      Meds ordered this encounter  Medications   DISCONTD: Vitamin D, Ergocalciferol, (DRISDOL) 1.25 MG (50000 UNIT) CAPS capsule    Sig: Take 1 capsule (50,000 Units total) by mouth every 7 (seven) days.    Dispense:  5 capsule    Refill:  0   metFORMIN (GLUCOPHAGE) 1000 MG tablet    Sig: 1/2 tab twice daily with meals     Dispense:  30 tablet    Refill:  0    30 d supply;  ** OV for RF **   Do not send RF request   DISCONTD: Vitamin D, Ergocalciferol, (DRISDOL) 1.25 MG (50000 UNIT) CAPS capsule    Sig: Take 1 capsule (50,000 Units total) by mouth every 7 (seven) days.    Dispense:  5 capsule    Refill:  0   DISCONTD: Vitamin D, Ergocalciferol, (DRISDOL) 1.25 MG (50000 UNIT) CAPS capsule    Sig: Take 1 capsule (50,000 Units total) by mouth every 7 (seven) days.    Dispense:  5 capsule    Refill:  0   Vitamin D, Ergocalciferol, (DRISDOL) 1.25 MG (50000 UNIT) CAPS capsule    Sig: Take 1 capsule (50,000 Units total) by mouth every 7 (seven) days.    Dispense:  4 capsule    Refill:  0     1. Insulin resistance [name] will continue to work on weight loss, exercise, and decreasing simple carbohydrates to help decrease the risk of diabetes. [name] agreed to follow-up with [place] as directed to closely monitor her progress.  Refill- metFORMIN (GLUCOPHAGE) 1000 MG tablet; 1/2 tab twice daily with meals  Dispense: 30 tablet; Refill: 0  2. Vitamin D deficiency Low Vitamin D level contributes to fatigue and are associated with obesity, breast, and colon cancer. She agrees to continue to take prescription Vitamin D 50,000 IU every week and will follow-up for  routine testing of Vitamin D, at least 2-3 times per year to avoid over-replacement.  Refill- Vitamin D, Ergocalciferol, (DRISDOL) 1.25 MG (50000 UNIT) CAPS capsule; Take 1 capsule (50,000 Units total) by mouth every 7 (seven) days.  Dispense: 4 capsule; Refill: 0  3. At risk for deficient intake of food Brittney Farmer was given extensive education and counseling today of more than 10 minutes on risks associated with deficient food intake.  Counseled her on the importance of following our prescribed meal plan and eating adequate amounts of protein.  Discussed with [name] that inadequate food intake over longer periods of time can slow their metabolism down  significantly.   4. Obesity, Current BMI 40.7 Bristal is currently in the action stage of change. As such, her goal is to continue with weight loss efforts. She has agreed to the Category 2 Plan.   Exercise goals:  As is  Behavioral modification strategies: increasing lean protein intake, decreasing simple carbohydrates, and planning for success.  [name] has agreed to follow-up with our clinic in 3-4 weeks. She was informed of the importance of frequent follow-up visits to maximize her success with intensive lifestyle modifications for her multiple health conditions.   Objective:   Blood pressure 129/73, pulse 72, temperature 97.7 F (36.5 C), height '5\' 3"'$  (1.6 m), weight 230 lb (104.3 kg), SpO2 100 %. Body mass index is 40.74 kg/m.  General: Cooperative, alert, well developed, in no acute distress. HEENT: Conjunctivae and lids unremarkable. Cardiovascular: Regular rhythm.  Lungs: Normal work of breathing. Neurologic: No focal deficits.   Lab Results  Component Value Date   CREATININE 0.63 11/08/2021   BUN 10 11/08/2021   NA 141 11/08/2021   K 4.3 11/08/2021   CL 103 11/08/2021   CO2 26 11/08/2021   Lab Results  Component Value Date   ALT 8 05/12/2021   AST 7 05/12/2021   ALKPHOS 90 05/12/2021   BILITOT 0.5 05/12/2021   Lab Results  Component Value Date   HGBA1C 5.2 11/11/2021   HGBA1C 5.1 02/20/2020   Lab Results  Component Value Date   INSULIN 12.5 11/11/2021   INSULIN 5.2 02/20/2020   Lab Results  Component Value Date   TSH 1.400 11/08/2021   Lab Results  Component Value Date   CHOL 217 (H) 11/11/2021   HDL 57 11/11/2021   LDLCALC 148 (H) 11/11/2021   TRIG 69 11/11/2021   CHOLHDL 3.8 11/11/2021   Lab Results  Component Value Date   VD25OH 41.2 11/11/2021   VD25OH 40.6 05/12/2021   VD25OH 69.1 09/01/2020   Lab Results  Component Value Date   WBC 8.7 11/08/2021   HGB 13.1 11/08/2021   HCT 39.0 11/08/2021   MCV 88 11/08/2021   PLT 340  11/08/2021   Lab Results  Component Value Date   IRON 38 02/20/2020   TIBC 284 02/20/2020   FERRITIN 103 02/20/2020   Attestation Statements:   Reviewed by clinician on day of visit: allergies, medications, problem list, medical history, surgical history, family history, social history, and previous encounter notes.  I, Kathlene November, BS, CMA, am acting as transcriptionist for [employer], DO.   I have reviewed the above documentation for accuracy and completeness, and I agree with the above. Marjory Sneddon, D.O.  The [place] was signed into law in 2016 which includes the topic of electronic health records.  This provides immediate access to information in MyChart.  This includes consultation notes, operative notes, office notes, lab  results and pathology reports.  If you have any questions about what you read please let us know at your next visit so we can discuss your concerns and take corrective action if need be.  We are right here with you.

## 2022-01-25 NOTE — Patient Instructions (Signed)
Medication Instructions:  Adjust your Pindolol to every 12 hours (for example 8 am and 8 pm)  *If you need a refill on your cardiac medications before your next appointment, please call your pharmacy*   Follow-Up: At [facility], you and your health needs are our priority.  As part of our continuing mission to provide you with exceptional heart care, we have created designated Provider Care Teams.  These Care Teams include your primary Cardiologist (physician) and Advanced Practice Providers (APPs -  Physician Assistants and Nurse Practitioners) who all work together to provide you with the care you need, when you need it.  We recommend signing up for the patient portal called "MyChart".  Sign up information is provided on this After Visit Summary.  MyChart is used to connect with patients for Virtual Visits (Telemedicine).  Patients are able to view lab/test results, encounter notes, upcoming appointments, etc.  Non-urgent messages can be sent to your provider as well.   To learn more about what you can do with MyChart, go to [email or web address].    Your next appointment:   11 month(s)  The format for your next appointment:   In Person  Provider:   Fransico Him, MD     Other Instructions If your symptoms continue after changing Pindolol to every 12 hours, send [place] a message in MyChart. Limit caffeine as much as possible. It would be best to avoid "energy" drinks  Important Information About Sugar

## 2022-01-25 NOTE — Assessment & Plan Note (Signed)
She mainly has chest pain associated with palpitations. Her recent echocardiogram showed normal LVEF and ETT was low risk. Adjust beta-blocker as noted. No further CV testing is needed at this time.

## 2022-01-25 NOTE — Assessment & Plan Note (Signed)
Overall her symptoms are improved. However, she is still having symptoms at night. She takes her 2nd dose of Pindolol around 3 pm. I have asked her to change her Pindolol to every 12 hours. We also discussed reducing caffeine intake. She had an energy drink recently and felt bad. I have recommended that she avoid these. If she has continued symptoms with taking the pindolol q 12 hours, she will let us know. I would recommend increasing her dose of Pindolol to 2.5 mg in A and 5 mg in P vs 5 mg twice daily. As long as her symptoms are controlled, she can f/u with Dr. Radford Pax in Aug 2024.

## 2022-02-15 ENCOUNTER — Ambulatory Visit (INDEPENDENT_AMBULATORY_CARE_PROVIDER_SITE_OTHER): Payer: 59 | Admitting: Family Medicine

## 2022-04-19 DIAGNOSIS — R000 Tachycardia, unspecified: Secondary | ICD-10-CM | POA: Diagnosis not present

## 2022-04-19 DIAGNOSIS — R69 Illness, unspecified: Secondary | ICD-10-CM | POA: Diagnosis not present

## 2022-04-19 DIAGNOSIS — K219 Gastro-esophageal reflux disease without esophagitis: Secondary | ICD-10-CM | POA: Diagnosis not present

## 2022-04-19 DIAGNOSIS — Z6841 Body Mass Index (BMI) 40.0 and over, adult: Secondary | ICD-10-CM | POA: Diagnosis not present

## 2022-04-21 ENCOUNTER — Encounter: Payer: Self-pay | Admitting: Family

## 2022-04-21 ENCOUNTER — Ambulatory Visit (INDEPENDENT_AMBULATORY_CARE_PROVIDER_SITE_OTHER): Payer: 59 | Admitting: Family

## 2022-04-21 VITALS — BP 121/77 | HR 81 | Temp 97.6°F | Ht 63.0 in | Wt 251.0 lb

## 2022-04-21 DIAGNOSIS — M5441 Lumbago with sciatica, right side: Secondary | ICD-10-CM

## 2022-04-21 DIAGNOSIS — M5442 Lumbago with sciatica, left side: Secondary | ICD-10-CM

## 2022-04-21 DIAGNOSIS — G8929 Other chronic pain: Secondary | ICD-10-CM | POA: Diagnosis not present

## 2022-04-21 MED ORDER — BACLOFEN 10 MG PO TABS: 10.0000 mg | ORAL_TABLET | Freq: Three times a day (TID) | ORAL | 0 refills | Status: DC

## 2022-04-21 MED ORDER — PREDNISONE 10 MG (21) PO TBPK: ORAL_TABLET | ORAL | 0 refills | Status: DC

## 2022-04-21 MED ORDER — DICLOFENAC SODIUM 75 MG PO TBEC: 75.0000 mg | DELAYED_RELEASE_TABLET | Freq: Two times a day (BID) | ORAL | 0 refills | Status: DC

## 2022-04-21 NOTE — Patient Instructions (Signed)

## 2022-04-21 NOTE — Progress Notes (Signed)
Subjective:    Patient ID: Brittney Farmer, female    DOB: 06/29/1985, 36 y.o.   MRN: [id number]  Chief Complaint  Patient presents with   Back Pain    Wants referral    PT presents to the office today with chronic lumbar back pain. States this pain has been on going on and off for years. She went to a chiropractor and was told she had an abnormal x-ray.   Back Pain This is a chronic problem. The current episode started more than 1 year ago. The problem occurs intermittently. The problem has been waxing and waning since onset. The pain is present in the lumbar spine. The quality of the pain is described as aching. The pain is at a severity of 8/10. The pain is mild. The symptoms are aggravated by bending and twisting. Associated symptoms include leg pain. Pertinent negatives include no pelvic pain or weakness. Risk factors include obesity. She has tried bed rest and NSAIDs for the symptoms. The treatment provided mild relief.      Review of Systems  Genitourinary:  Negative for pelvic pain.  Musculoskeletal:  Positive for back pain.  Neurological:  Negative for weakness.  All other systems reviewed and are negative.      Objective:   Physical Exam Vitals reviewed.  Constitutional:      General: She is not in acute distress.    Appearance: She is well-developed. She is obese.  HENT:     Head: Normocephalic and atraumatic.     Right Ear: Tympanic membrane normal.     Left Ear: Tympanic membrane normal.  Eyes:     Pupils: Pupils are equal, round, and reactive to light.  Neck:     Thyroid: No thyromegaly.  Cardiovascular:     Rate and Rhythm: Normal rate and regular rhythm.     Heart sounds: Normal heart sounds. No murmur heard. Pulmonary:     Effort: Pulmonary effort is normal. No respiratory distress.     Breath sounds: Normal breath sounds. No wheezing.  Abdominal:     General: Bowel sounds are normal. There is no distension.     Palpations: Abdomen is soft.      Tenderness: There is no abdominal tenderness.  Musculoskeletal:        General: Tenderness present.     Cervical back: Normal range of motion and neck supple.     Comments: Pain in lumbar with flexion or extension, negative SLR  Skin:    General: Skin is warm and dry.  Neurological:     Mental Status: She is alert and oriented to person, place, and time.     Cranial Nerves: No cranial nerve deficit.     Deep Tendon Reflexes: Reflexes are normal and symmetric.  Psychiatric:        Behavior: Behavior normal.        Thought Content: Thought content normal.        Judgment: Judgment normal.     BP 121/77   Pulse 81   Temp 97.6 F (36.4 C) (Temporal)   Ht '5\' 3"'$  (1.6 m)   Wt 251 lb (113.9 kg)   SpO2 99%   BMI 44.46 kg/m        Assessment & Plan:  Anaid Haney comes in today with chief complaint of Back Pain (Wants referral )   Diagnosis and orders addressed:  1. Chronic bilateral low back pain with bilateral sciatica Rest ROM exercises No other NSAID's  Referral to PT  and Ortho Follow up if symptoms worsen or do not improve  - baclofen (LIORESAL) 10 MG tablet; Take 1 tablet (10 mg total) by mouth 3 (three) times daily.  Dispense: 30 each; Refill: 0 - predniSONE (STERAPRED UNI-PAK 21 TAB) 10 MG (21) TBPK tablet; Use as directed  Dispense: 21 tablet; Refill: 0 - diclofenac (VOLTAREN) 75 MG EC tablet; Take 1 tablet (75 mg total) by mouth 2 (two) times daily.  Dispense: 30 tablet; Refill: 0 - Ambulatory referral to Orthopedic Surgery - Ambulatory referral to Physical Therapy      Evelina Dun, FNP

## 2022-04-26 ENCOUNTER — Other Ambulatory Visit: Payer: Self-pay

## 2022-04-26 ENCOUNTER — Ambulatory Visit: Payer: 59 | Attending: Family

## 2022-04-26 DIAGNOSIS — G8929 Other chronic pain: Secondary | ICD-10-CM | POA: Diagnosis not present

## 2022-04-26 DIAGNOSIS — M6281 Muscle weakness (generalized): Secondary | ICD-10-CM | POA: Insufficient documentation

## 2022-04-26 DIAGNOSIS — M5442 Lumbago with sciatica, left side: Secondary | ICD-10-CM | POA: Insufficient documentation

## 2022-04-26 DIAGNOSIS — M5416 Radiculopathy, lumbar region: Secondary | ICD-10-CM | POA: Diagnosis not present

## 2022-04-26 DIAGNOSIS — M5441 Lumbago with sciatica, right side: Secondary | ICD-10-CM | POA: Diagnosis not present

## 2022-04-26 NOTE — Therapy (Addendum)
OUTPATIENT PHYSICAL THERAPY THORACOLUMBAR EVALUATION   Patient Name: Brittney Farmer MRN: [id number] DOB:[birth year], 36 y.o., female Today's Date: 04/26/2022  END OF SESSION:  PT End of Session - 04/26/22 1433     Visit Number 1    Number of Visits 10    Date for PT Re-Evaluation 06/24/22    PT Start Time 1435    PT Stop Time 1515    PT Time Calculation (min) 40 min    Activity Tolerance Patient tolerated treatment well    Behavior During Therapy WFL for tasks assessed/performed             Past Medical History:  Diagnosis Date   ADD (attention deficit disorder)    ADHD    Anemia    Anxiety    Back pain    Constipation    Depression    GERD (gastroesophageal reflux disease)    IBS (irritable bowel syndrome)    IBS (irritable bowel syndrome)    Palpitations    SOB (shortness of breath) on exertion    Spine curvature, acquired    Stomach ulcer    Past Surgical History:  Procedure Laterality Date   BIOPSY  07/27/2020   Procedure: BIOPSY;  Surgeon: Lanelle Bal, DO;  Location: [facility];  Service: Endoscopy;;   COLONOSCOPY WITH PROPOFOL N/A 07/27/2020   Procedure: COLONOSCOPY WITH PROPOFOL;  Surgeon: Lanelle Bal, DO;  Location: [facility];  Service: Endoscopy;  Laterality: N/A;  PM   ESOPHAGOGASTRODUODENOSCOPY (EGD) WITH PROPOFOL N/A 07/27/2020   Procedure: ESOPHAGOGASTRODUODENOSCOPY (EGD) WITH PROPOFOL;  Surgeon: Lanelle Bal, DO;  Location: [facility];  Service: Endoscopy;  Laterality: N/A;   HAND SURGERY Left 09/08/2016   [facility]   TUBAL LIGATION  03/13/2019   UMBILICAL HERNIA REPAIR N/A 10/20/2021   Procedure: HERNIA REPAIR UMBILICAL ADULT W/ MESH;  Surgeon: Lewie Chamber, DO;  Location: [facility];  Service: General;  Laterality: N/A;   VAGINAL DELIVERY  03/13/2019   WISDOM TOOTH EXTRACTION     Patient Active Problem List   Diagnosis Date Noted   Precordial chest pain 01/25/2022   SVT (supraventricular tachycardia) 01/24/2022    B12 deficiency 11/11/2021   Insulin resistance 11/11/2021   Umbilical hernia without obstruction and without gangrene    Nausea with vomiting    Rectal bleeding    At risk for activity intolerance 05/07/2020   Other hyperlipidemia 03/05/2020   Vitamin D deficiency 03/05/2020   Depression 03/05/2020   Other fatigue 02/20/2020   SOB (shortness of breath) on exertion 02/20/2020   Other irritable bowel syndrome 02/20/2020   Absolute anemia 02/20/2020   Mood disorder with emotional eating 02/20/2020   At risk for impaired metabolic function 02/20/2020   Allergic rhinitis 10/31/2019   Gastroesophageal reflux disease without esophagitis 08/08/2019   Migraine 07/12/2019   Morbid obesity (HCC) 12/22/2014   GAD (generalized anxiety disorder) 06/06/2014   IBS (irritable bowel syndrome) 06/06/2014   REFERRING PROVIDER: Junie Spencer, FNP   REFERRING DIAG: Chronic bilateral low back pain with bilateral sciatica   Rationale for Evaluation and Treatment: Rehabilitation  THERAPY DIAG:  Radiculopathy, lumbar region  Muscle weakness (generalized)  ONSET DATE: 2009  SUBJECTIVE:  SUBJECTIVE STATEMENT: Patient reports that she began having back pain when her son was born in 2009. However, it started getting worse over the past 3 months. She has noticed that her foot and legs (L>R) will go numb if she sits to go to the bathroom. She was told by a chiropractor that her L3-4 were "twisted and rotated."  She notes that she can hardly sleep an hour due to her back pain.   PERTINENT HISTORY:  Allergies, anxiety, obesity, depression, ADD, ADHD  PAIN:  Are you having pain? Yes: NPRS scale: 8-9/10 Pain location: low back and bilateral lower extremities (L>R) Pain description: numbness, aching, shooting  constant Aggravating factors: sitting, cleaning, standing (5-10 minutes), sitting (5-10 minutes), laying down  Relieving factors: medication  PRECAUTIONS: None  WEIGHT BEARING RESTRICTIONS: No  FALLS:  Has patient fallen in last 6 months? No  LIVING ENVIRONMENT: Lives with: lives with their family Lives in: House/apartment  OCCUPATION: Diplomatic Services operational officer; prolonged sitting    PLOF: Independent  PATIENT GOALS: reduced pain, be able to play with her 4 children, clean, sit longer and be able to exercise more to lose weight  NEXT MD VISIT: 09/11/22  OBJECTIVE:   PATIENT SURVEYS:  FOTO 37.05  SCREENING FOR RED FLAGS: Bowel or bladder incontinence: No Spinal tumors: No Cauda equina syndrome: No Compression fracture: No Abdominal aneurysm: No  COGNITION: Overall cognitive status: Within functional limits for tasks assessed     SENSATION: Patient reports no numbness or tingling currently.  POSTURE: rounded shoulders, forward head, and flexed trunk   PALPATION: TTP: bilateral gluteals, and TFL  LUMBAR ROM:   AROM eval  Flexion 80  Extension 14  Right lateral flexion 25% limited; left low back pain  Left lateral flexion 25% limited  Right rotation 25% limited; painful   Left rotation 25% limited   (Blank rows = not tested)  LOWER EXTREMITY ROM: WFL for activities assessed  LOWER EXTREMITY MMT: left low back pain was aggravated with MMT   MMT Right eval Left eval  Hip flexion 4+/5 4/5  Hip extension    Hip abduction    Hip adduction    Hip internal rotation    Hip external rotation    Knee flexion 5/5 5/5  Knee extension 4+/5 4/5  Ankle dorsiflexion 4/5 4/5  Ankle plantarflexion    Ankle inversion    Ankle eversion     (Blank rows = not tested)  GAIT: Assistive device utilized: None Level of assistance: Complete Independence Comments: Decreased gait speed, stride length, and hip extension bilaterally  TODAY'S TREATMENT:                                                                                                                               DATE:     PATIENT EDUCATION:  Education details: Plan of care, healing, prognosis, goals for therapy, and anatomy Person educated: Patient Education method: Explanation Education comprehension: verbalized understanding  HOME EXERCISE PROGRAM:  ASSESSMENT:  CLINICAL IMPRESSION: Patient is a 36 y.o. female who was seen today for physical therapy evaluation and treatment for chronic low back pain with bilateral lower extremity pain.  She presented with moderate to high pain severity and irritability with lumbar active range of motion to the right and palpation along her lumbar spine and surrounding musculature being the most aggravating to her familiar pain.  Recommend that she continue with skilled physical therapy to address her impairments and maximize her functional mobility.  PHYSICAL THERAPY DISCHARGE SUMMARY  Visits from Start of Care: 1  Current functional level related to goals / functional outcomes: Patient was unable to meet her goals for skilled physical therapy as she did not follow up after her initial evaluation.    Remaining deficits: Pain and lower extremity strength    Education / Equipment: See patient education   Patient agrees to discharge. Patient goals were not met. Patient is being discharged due to not returning since the last visit.  Candi Leash, PT, DPT    OBJECTIVE IMPAIRMENTS: Abnormal gait, decreased activity tolerance, decreased mobility, difficulty walking, decreased ROM, decreased strength, hypomobility, impaired sensation, impaired tone, postural dysfunction, and pain.   ACTIVITY LIMITATIONS: lifting, bending, sitting, standing, squatting, sleeping, bed mobility, toileting, dressing, locomotion level, and caring for others  PARTICIPATION LIMITATIONS: meal prep, cleaning, laundry, shopping, community activity, and occupation  PERSONAL FACTORS: Profession,  Time since onset of injury/illness/exacerbation, and 3+ comorbidities: Allergies, anxiety, obesity, depression, ADD, ADHD  are also affecting patient's functional outcome.   REHAB POTENTIAL: Fair    CLINICAL DECISION MAKING: Evolving/moderate complexity  EVALUATION COMPLEXITY: Moderate   GOALS: Goals reviewed with patient? Yes  SHORT TERM GOALS: Target date: 05/10/22  Patient will be independent with her initial HEP. Baseline: Goal status: INITIAL  2.  Patient will to complete her daily activities without her familiar pain exceeding 6/10. Baseline:  Goal status: INITIAL  3.  Patient will be able to sit for at least 20 minutes without being limited by her familiar low back pain. Baseline:  Goal status: INITIAL  LONG TERM GOALS: Target date: 05/31/22  Patient will be independent with her advanced HEP. Baseline:  Goal status: INITIAL  2.  Patient be able to complete her daily activities without her familiar pain exceeding 4/10. Baseline:  Goal status: INITIAL  3.  Patient will be able to sit for at least 30 minutes without being limited by her familiar low back pain. Baseline:  Goal status: INITIAL  4.  Patient will be able to improve her left hip flexor and knee extensor strength to at least 4+/5 for improved lower extremity strength. Baseline:  Goal status: INITIAL  PLAN:  PT FREQUENCY: 2-3x/week  PT DURATION: other: 5 weeks  PLANNED INTERVENTIONS: Therapeutic exercises, Therapeutic activity, Neuromuscular re-education, Balance training, Gait training, Patient/Family education, Self Care, Joint mobilization, Dry Needling, Electrical stimulation, Spinal mobilization, Cryotherapy, Moist heat, Traction, Ultrasound, Manual therapy, and Re-evaluation.  PLAN FOR NEXT SESSION: NuStep, lower extremity strengthening, postural reeducation, lumbar stabilization, manual therapy, and modalities as needed   Granville Lewis, PT 04/26/2022, 4:14 PM

## 2022-04-29 ENCOUNTER — Encounter: Payer: 59 | Admitting: *Deleted

## 2022-05-03 ENCOUNTER — Ambulatory Visit (INDEPENDENT_AMBULATORY_CARE_PROVIDER_SITE_OTHER): Payer: Self-pay | Admitting: Family Medicine

## 2022-05-03 ENCOUNTER — Encounter (INDEPENDENT_AMBULATORY_CARE_PROVIDER_SITE_OTHER): Payer: Self-pay | Admitting: Family Medicine

## 2022-05-03 VITALS — BP 130/76 | HR 74 | Temp 98.2°F | Ht 63.0 in | Wt 246.6 lb

## 2022-05-03 DIAGNOSIS — E88819 Insulin resistance, unspecified: Secondary | ICD-10-CM

## 2022-05-03 DIAGNOSIS — F5089 Other specified eating disorder: Secondary | ICD-10-CM

## 2022-05-03 DIAGNOSIS — E7849 Other hyperlipidemia: Secondary | ICD-10-CM

## 2022-05-03 DIAGNOSIS — E669 Obesity, unspecified: Secondary | ICD-10-CM

## 2022-05-03 DIAGNOSIS — E559 Vitamin D deficiency, unspecified: Secondary | ICD-10-CM

## 2022-05-03 DIAGNOSIS — F509 Eating disorder, unspecified: Secondary | ICD-10-CM | POA: Insufficient documentation

## 2022-05-03 DIAGNOSIS — Z6841 Body Mass Index (BMI) 40.0 and over, adult: Secondary | ICD-10-CM

## 2022-05-03 MED ORDER — TOPIRAMATE 50 MG PO TABS: 50.0000 mg | ORAL_TABLET | Freq: Every day | ORAL | 0 refills | Status: DC

## 2022-05-03 MED ORDER — METFORMIN HCL 500 MG PO TABS: ORAL_TABLET | ORAL | 0 refills | Status: DC
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
## 2022-05-05 ENCOUNTER — Ambulatory Visit: Payer: 59 | Attending: Family | Admitting: Physical Therapy

## 2022-05-11 LAB — LIPID PANEL
Chol/HDL Ratio: 4.8 ratio — ABNORMAL HIGH (ref 0.0–4.4)
Cholesterol, Total: 225 mg/dL — ABNORMAL HIGH (ref 100–199)
HDL: 47 mg/dL (ref >39)
LDL Chol Calc (NIH): 166 mg/dL — ABNORMAL HIGH (ref 0–99)
Triglycerides: 70 mg/dL (ref 0–149)
VLDL Cholesterol Cal: 12 mg/dL (ref 5–40)

## 2022-05-11 LAB — COMPREHENSIVE METABOLIC PANEL
ALT: 9 IU/L (ref 0–32)
AST: 12 IU/L (ref 0–40)
Albumin/Globulin Ratio: 1.8 (ref 1.2–2.2)
Albumin: 4.2 g/dL (ref 3.9–4.9)
Alkaline Phosphatase: 115 IU/L (ref 44–121)
BUN/Creatinine Ratio: 18 (ref 9–23)
BUN: 12 mg/dL (ref 6–20)
Bilirubin Total: 0.5 mg/dL (ref 0.0–1.2)
CO2: 23 mmol/L (ref 20–29)
Calcium: 9.1 mg/dL (ref 8.7–10.2)
Chloride: 102 mmol/L (ref 96–106)
Creatinine, Ser: 0.66 mg/dL (ref 0.57–1.00)
Globulin, Total: 2.4 g/dL (ref 1.5–4.5)
Glucose: 81 mg/dL (ref 70–99)
Potassium: 4.3 mmol/L (ref 3.5–5.2)
Sodium: 139 mmol/L (ref 134–144)
Total Protein: 6.6 g/dL (ref 6.0–8.5)
eGFR: 118 mL/min/{1.73_m2} (ref >59)

## 2022-05-11 LAB — VITAMIN D 25 HYDROXY (VIT D DEFICIENCY, FRACTURES): Vit D, 25-Hydroxy: 27.7 ng/mL — ABNORMAL LOW (ref 30.0–100.0)

## 2022-05-11 LAB — INSULIN, RANDOM: INSULIN: 10 u[IU]/mL (ref 2.6–24.9)

## 2022-05-11 LAB — HEMOGLOBIN A1C
Est. average glucose Bld gHb Est-mCnc: 105 mg/dL
Hgb A1c MFr Bld: 5.3 % (ref 4.8–5.6)

## 2022-05-14 NOTE — Progress Notes (Signed)
Chief Complaint:   OBESITY Brittney Farmer is here to discuss her progress with her obesity treatment plan along with follow-up of her obesity related diagnoses. Brittney Farmer is on the Category 2 Plan and states she is following her eating plan approximately 0% of the time. Brittney Farmer states she is not exercising.  Today's visit was #: 24 Starting weight: 235 LBS Starting date: 02/20/2020 Today's weight: 246 LBS Today's date: 05/03/2022 Total lbs lost to date: 0 Total lbs lost since last in-office visit: +16 LBS  Interim History: Patient's last appointment with [place] was 01/18/2022.  Patient was down in the dumps and eating more sweets and could not control her appetite.  Insurance would not cover GLP-1 therapy or any weight loss medications.  Due to anxiety, phentermine is contraindicated especially with her history of SVT's, patient is on pindolol.  Subjective:   1. Insulin resistance Patient has had more GI upset with metformin lately.  She has been having loose stools about 30 minutes after taking.  Patient is taking metformin 500 mg twice a day.  She is poorly tolerating medication and has worsening A1c and fasting insulin.    2. Other hyperlipidemia Last LDL 148 5 months ago, worse than prior.  Patient loves sweets and fried fast foods.  3. Vitamin D deficiency Patient is off all vitamin D supplements x 3 months.  4. Other disorder of eating-emotional eating Patient has been on Topamax for migraines.  No issues with headaches recently.  Assessment/Plan:   Orders Placed This Encounter  Procedures   VITAMIN D 25 Hydroxy (Vit-D Deficiency, Fractures)   Lipid panel   Comprehensive metabolic panel   Hemoglobin A1c   Insulin, random   TSH    Medications Discontinued During This Encounter  Medication Reason   Multiple Vitamins-Minerals (WOMENS MULTIVITAMIN PO) Completed Course   metFORMIN (GLUCOPHAGE) 1000 MG tablet Reorder   metFORMIN (GLUCOPHAGE) 500 MG tablet Reorder     Meds  ordered this encounter  Medications   DISCONTD: metFORMIN (GLUCOPHAGE) 500 MG tablet    Sig: 1/2 tab twice daily with meals    Dispense:  30 tablet    Refill:  0    30 d supply;  ** OV for RF **   Do not send RF request   topiramate (TOPAMAX) 50 MG tablet    Sig: Take 1 tablet (50 mg total) by mouth daily.    Dispense:  30 tablet    Refill:  0   metFORMIN (GLUCOPHAGE) 500 MG tablet    Sig: 1/2 tab twice daily with meals    Dispense:  30 tablet    Refill:  0    30 d supply;  ** OV for RF **   Do not send RF request     1. Insulin resistance Check labs today. In near future check TSH.  We will refill metformin to 500 mg half tab twice daily as tolerated.  If cannot tolerate we will change therapy.  Will also consider changing medications pending lab results and insurance coverage.  - Hemoglobin A1c - Insulin, random  Change/decrease- metFORMIN (GLUCOPHAGE) 500 MG tablet; 1/2 tab twice daily with meals  Dispense: 30 tablet; Refill: 0  2. Other hyperlipidemia Continue PNP on weight loss.  Check labs in the near future  - Lipid panel - Comprehensive metabolic panel  3. Vitamin D deficiency Recheck vitamin D level in near future resume ergocalciferol as indicated.  - VITAMIN D 25 Hydroxy (Vit-D Deficiency, Fractures)  4. Other disorder  of eating-emotional eating Refer to Dr. Mallie Mussel, patient will make appointment today before leaving clinic.  Risk and benefits of medications discussed with patient.  Start- topiramate (TOPAMAX) 50 MG tablet; Take 1 tablet (50 mg total) by mouth daily.  Dispense: 30 tablet; Refill: 0  5. Obesity, Current BMI 43.7 Gave patient plan again an extensive discussion with patient regarding weight loss surgery as meds versus diet/lifestyle.  Refer to Dr. Mallie Mussel for emotional eating as Topamax.  Obtain fasting labs prior to next office visit.  Brittney Farmer is currently in the action stage of change. As such, her goal is to get back to weightloss efforts . She  has agreed to the Category 2 Plan.   Exercise goals:  As is.  Behavioral modification strategies: planning for success.  Brittney Farmer has agreed to follow-up with our clinic in 4 weeks. She was informed of the importance of frequent follow-up visits to maximize her success with intensive lifestyle modifications for her multiple health conditions.   Objective:   Blood pressure 130/76, pulse 74, temperature 98.2 F (36.8 C), height '5\' 3"'$  (1.6 m), weight 246 lb 9.6 oz (111.9 kg), SpO2 100 %. Body mass index is 43.68 kg/m.  General: Cooperative, alert, well developed, in no acute distress. HEENT: Conjunctivae and lids unremarkable. Cardiovascular: Regular rhythm.  Lungs: Normal work of breathing. Neurologic: No focal deficits.   Lab Results  Component Value Date   CREATININE 0.66 05/10/2022   BUN 12 05/10/2022   NA 139 05/10/2022   K 4.3 05/10/2022   CL 102 05/10/2022   CO2 23 05/10/2022   Lab Results  Component Value Date   ALT 9 05/10/2022   AST 12 05/10/2022   ALKPHOS 115 05/10/2022   BILITOT 0.5 05/10/2022   Lab Results  Component Value Date   HGBA1C 5.3 05/10/2022   HGBA1C 5.2 11/11/2021   HGBA1C 5.1 02/20/2020   Lab Results  Component Value Date   INSULIN 10.0 05/10/2022   INSULIN 12.5 11/11/2021   INSULIN 5.2 02/20/2020   Lab Results  Component Value Date   TSH 1.400 11/08/2021   Lab Results  Component Value Date   CHOL 225 (H) 05/10/2022   HDL 47 05/10/2022   LDLCALC 166 (H) 05/10/2022   TRIG 70 05/10/2022   CHOLHDL 4.8 (H) 05/10/2022   Lab Results  Component Value Date   VD25OH 27.7 (L) 05/10/2022   VD25OH 41.2 11/11/2021   VD25OH 40.6 05/12/2021   Lab Results  Component Value Date   WBC 8.7 11/08/2021   HGB 13.1 11/08/2021   HCT 39.0 11/08/2021   MCV 88 11/08/2021   PLT 340 11/08/2021   Lab Results  Component Value Date   IRON 38 02/20/2020   TIBC 284 02/20/2020   FERRITIN 103 02/20/2020   Attestation Statements:   Reviewed by  clinician on day of visit: allergies, medications, problem list, medical history, surgical history, family history, social history, and previous encounter notes.  I, Davy Pique, RMA, am acting as Location manager for [employer], DO.   I have reviewed the above documentation for accuracy and completeness, and I agree with the above. Marjory Sneddon, D.O.  The [place] was signed into law in 2016 which includes the topic of electronic health records.  This provides immediate access to information in MyChart.  This includes consultation notes, operative notes, office notes, lab results and pathology reports.  If you have any questions about what you read please let us know at your next visit so  we can discuss your concerns and take corrective action if need be.  We are right here with you.

## 2022-05-17 ENCOUNTER — Telehealth (INDEPENDENT_AMBULATORY_CARE_PROVIDER_SITE_OTHER): Payer: Self-pay | Admitting: Psychology

## 2022-05-17 DIAGNOSIS — F5089 Other specified eating disorder: Secondary | ICD-10-CM

## 2022-05-17 DIAGNOSIS — F4320 Adjustment disorder, unspecified: Secondary | ICD-10-CM

## 2022-05-24 ENCOUNTER — Encounter (INDEPENDENT_AMBULATORY_CARE_PROVIDER_SITE_OTHER): Payer: Self-pay | Admitting: Family Medicine

## 2022-05-24 ENCOUNTER — Telehealth (INDEPENDENT_AMBULATORY_CARE_PROVIDER_SITE_OTHER): Payer: Self-pay | Admitting: Psychology

## 2022-05-24 ENCOUNTER — Ambulatory Visit (INDEPENDENT_AMBULATORY_CARE_PROVIDER_SITE_OTHER): Payer: Self-pay | Admitting: Family Medicine

## 2022-05-24 VITALS — BP 121/80 | HR 75 | Temp 97.7°F | Ht 63.0 in | Wt 239.2 lb

## 2022-05-24 DIAGNOSIS — E88819 Insulin resistance, unspecified: Secondary | ICD-10-CM

## 2022-05-24 DIAGNOSIS — Z6841 Body Mass Index (BMI) 40.0 and over, adult: Secondary | ICD-10-CM

## 2022-05-24 DIAGNOSIS — E7849 Other hyperlipidemia: Secondary | ICD-10-CM

## 2022-05-24 DIAGNOSIS — E559 Vitamin D deficiency, unspecified: Secondary | ICD-10-CM

## 2022-05-24 DIAGNOSIS — F5089 Other specified eating disorder: Secondary | ICD-10-CM

## 2022-05-24 DIAGNOSIS — E669 Obesity, unspecified: Secondary | ICD-10-CM

## 2022-05-24 MED ORDER — TOPIRAMATE 50 MG PO TABS: 50.0000 mg | ORAL_TABLET | Freq: Two times a day (BID) | ORAL | 0 refills | Status: DC

## 2022-05-24 MED ORDER — VITAMIN D (ERGOCALCIFEROL) 1.25 MG (50000 UNIT) PO CAPS: 50000.0000 [IU] | ORAL_CAPSULE | ORAL | 0 refills | Status: DC

## 2022-05-24 MED ORDER — METFORMIN HCL 500 MG PO TABS: ORAL_TABLET | ORAL | 0 refills | Status: DC

## 2022-05-24 NOTE — Progress Notes (Unsigned)
  Office: [phone]  /  Fax: [phone]    Date: May 24, 2022    Appointment Start Time: *** Duration: *** minutes Provider: Glennie Isle, Psy.D. Type of Session: Individual Therapy  Location of Patient: {gbptloc:23249} (private location) Location of Provider: Provider's Home (private office) Type of Contact: Telepsychological Visit via MyChart Video Visit  Session Content: This provider called [name] at 11:32am as she did not present for today's appointment. *** As such, today's appointment was initiated *** minutes late.Brittney Farmer is a 37 y.o. female presenting for a follow-up appointment to address the previously established treatment goal of increasing coping skills.Today's appointment was a telepsychological visit. [name] provided verbal consent for today's telepsychological appointment and she is aware she is responsible for securing confidentiality on her end of the session. Prior to proceeding with today's appointment, Ikran's physical location at the time of this appointment was obtained as well a phone number she could be reached at in the event of technical difficulties. [name] and this provider participated in today's telepsychological service.   This provider conducted a brief check-in. *** [name] was receptive to today's appointment as evidenced by openness to sharing, responsiveness to feedback, and {gbreceptiveness:23401}.  Mental Status Examination:  Appearance: {Appearance:22431} Behavior: {Behavior:22445} Mood: {gbmood:21757} Affect: {Affect:22436} Speech: {Speech:22432} Eye Contact: {Eye Contact:22433} Psychomotor Activity: {Motor Activity:22434} Gait: {gbgait:23404} Thought Process: {thought process:22448}  Thought Content/Perception: {disturbances:22451} Orientation: {Orientation:22437} Memory/Concentration: {gbcognition:22449} Insight: {Insight:22446} Judgment: {Insight:22446}  Interventions:  {Interventions for Progress Notes:23405}  DSM-5  Diagnosis(es):  F50.89 Other Specified Feeding or Eating Disorder, Emotional and Binge Eating Behaviors and  F43.20 Adjustment Disorder, Unspecified   Treatment Goal & Progress: During the initial appointment with this provider, the following treatment goal was established: increase coping skills. [name] has demonstrated progress in her goal as evidenced by {gbtxprogress:22839}. [name] also {gbtxprogress2:22951}.  Plan: The next appointment is scheduled for *** at ***, which will be via MyChart Video Visit. The next session will focus on {Plan for Next Appointment:23400}.

## 2022-05-24 NOTE — Telephone Encounter (Signed)
  Office: [phone]  /  Fax: [phone]  Date of Call: May 24, 2022  Time of Call: 11:32am Duration of Call: ~1 minute Provider: Glennie Isle, PsyD  CONTENT: This provider called [name] to check-in as she did not present for today's MyChart Video Visit appointment at 11:30am. She indicated she forgot today's appointment, noting she was at the hospital with her grandfather. [name] requested to cancel today's appointment and reported she would call the clinic to schedule a follow-up appointment. No evidence or endorsement of any safety concerns. All questions/concerns addressed.   PLAN: This provider will wait for Tavionna to call back. No further follow-up planned by this provider.

## 2022-06-09 NOTE — Progress Notes (Signed)
Chief Complaint:   OBESITY Brittney Farmer is here to discuss her progress with her obesity treatment plan along with follow-up of her obesity related diagnoses. Brittney Farmer is on the Category 2 Plan and states she is following her eating plan approximately 30% of the time. Brittney Farmer states she is walking 15-20 minutes 3-4 times per week.  Today's visit was #: 25 Starting weight: 235 lbs Starting date: 02/20/2020 Today's weight: 239 lbs Today's date: 05/24/2022 Total lbs lost to date: 0 Total lbs lost since last in-office visit: 7 lbs  Interim History: Patient last seen 05/03/2022.  Breakfast, Premier shake or eggs and sausage.  Lunch salad with 4 ounces of protein.  Dinner is a hit or miss.  Overall, patient was mindful and ate out less.  Patient denies hunger or craving.  Here to review lab from last office visit.  Subjective:   1. Other hyperlipidemia Worsening.  Discussed labs with patient today. Patient ate out more at the end of last year.  More fast foods.  Has been better so far this year.  2. Insulin resistance Worsening.  Discussed labs with patient today.   Fasting insulin slightly improved from 12.5 to 10.0.  Worsening A1c from 5.1 at start of program to 5.3 now.  3. Other disorder of eating-emotional eating Patient started Topamax last office visit.  Tolerating well.  Patient has a decrease in craving and desires increase in dose.  4. Vitamin D deficiency Patient has been off ergocalciferol for 4 months or more.  Low vitamin D at 27.7.  Assessment/Plan:  No orders of the defined types were placed in this encounter.   Medications Discontinued During This Encounter  Medication Reason   topiramate (TOPAMAX) 50 MG tablet Reorder   metFORMIN (GLUCOPHAGE) 500 MG tablet Reorder     Meds ordered this encounter  Medications   metFORMIN (GLUCOPHAGE) 500 MG tablet    Sig: 1 tab twice daily with meals    Dispense:  60 tablet    Refill:  0    30 d supply;  ** OV for RF **   Do  not send RF request   topiramate (TOPAMAX) 50 MG tablet    Sig: Take 1 tablet (50 mg total) by mouth 2 (two) times daily.    Dispense:  60 tablet    Refill:  0   Vitamin D, Ergocalciferol, (DRISDOL) 1.25 MG (50000 UNIT) CAPS capsule    Sig: Take 1 capsule (50,000 Units total) by mouth every 7 (seven) days.    Dispense:  4 capsule    Refill:  0     1. Other hyperlipidemia Worsening LDL from 148 to 166 on 05/10/2022.  Discussed with patient saturated and trans fat and foods that are high in them.  Increase lean protein, cut out fried foods, bacon, pepperoni, etc.  2. Insulin resistance Counseling done on worsening A1c.  Decrease simple carbs and increase lean protein and continue with weight loss.  Refill- metFORMIN (GLUCOPHAGE) 500 MG tablet; 1 tab twice daily with meals  Dispense: 60 tablet; Refill: 0  3. Other disorder of eating-emotional eating Increase dose to twice a day.  Risk and benefits of medications discussed with patient today.  Continue PNP and weight loss.  Increase- topiramate (TOPAMAX) 50 MG tablet; Take 1 tablet (50 mg total) by mouth 2 (two) times daily.  Dispense: 60 tablet; Refill: 0  4. Vitamin D deficiency - I again reiterated the importance of vitamin D (as well as calcium) to their health  and wellbeing.  - I reviewed possible symptoms of low Vitamin D:  low energy, depressed mood, muscle aches, joint aches, osteoporosis etc. - low Vitamin D levels may be linked to an increased risk of cardiovascular events and even increased risk of cancers- such as colon and breast.  - ideal vitamin D levels reviewed with patient  - I recommend pt take a 50,000 IU weekly prescription vit D - see script below   - Informed patient this may be a lifelong thing, and she was encouraged to continue to take the medicine until told otherwise.    - weight loss will likely improve availability of vitamin D, thus encouraged Brittney Farmer to continue with meal plan and their weight loss efforts to  further improve this condition.  Thus, we will need to monitor levels regularly (every 3-4 mo on average) to keep levels within normal limits and prevent over supplementation. - pt's questions and concerns regarding this condition addressed.  Start- Vitamin D, Ergocalciferol, (DRISDOL) 1.25 MG (50000 UNIT) CAPS capsule; Take 1 capsule (50,000 Units total) by mouth every 7 (seven) days.  Dispense: 4 capsule; Refill: 0  5. Obesity, Current BMI 42.4 Follow category 2 or journal.  She thinks that journaling will make her more aware and accountable.  Brittney Farmer is currently in the action stage of change. As such, her goal is to continue with weight loss efforts. She has agreed to the Category 2 Plan and keeping a food journal and adhering to recommended goals of 1300-1400 calories and 90+ protein.   Exercise goals:  Increase activity as tolerated .  Behavioral modification strategies: increasing lean protein intake and decreasing simple carbohydrates.  Brittney Farmer has agreed to follow-up with our clinic in 3-4 weeks. She was informed of the importance of frequent follow-up visits to maximize her success with intensive lifestyle modifications for her multiple health conditions.   Objective:   Blood pressure 121/80, pulse 75, temperature 97.7 F (36.5 C), height 5' 3"$  (1.6 m), SpO2 96 %. Body mass index is 43.68 kg/m.  General: Cooperative, alert, well developed, in no acute distress. HEENT: Conjunctivae and lids unremarkable. Cardiovascular: Regular rhythm.  Lungs: Normal work of breathing. Neurologic: No focal deficits.   Lab Results  Component Value Date   CREATININE 0.66 05/10/2022   BUN 12 05/10/2022   NA 139 05/10/2022   K 4.3 05/10/2022   CL 102 05/10/2022   CO2 23 05/10/2022   Lab Results  Component Value Date   ALT 9 05/10/2022   AST 12 05/10/2022   ALKPHOS 115 05/10/2022   BILITOT 0.5 05/10/2022   Lab Results  Component Value Date   HGBA1C 5.3 05/10/2022   HGBA1C 5.2  11/11/2021   HGBA1C 5.1 02/20/2020   Lab Results  Component Value Date   INSULIN 10.0 05/10/2022   INSULIN 12.5 11/11/2021   INSULIN 5.2 02/20/2020   Lab Results  Component Value Date   TSH 1.400 11/08/2021   Lab Results  Component Value Date   CHOL 225 (H) 05/10/2022   HDL 47 05/10/2022   LDLCALC 166 (H) 05/10/2022   TRIG 70 05/10/2022   CHOLHDL 4.8 (H) 05/10/2022   Lab Results  Component Value Date   VD25OH 27.7 (L) 05/10/2022   VD25OH 41.2 11/11/2021   VD25OH 40.6 05/12/2021   Lab Results  Component Value Date   WBC 8.7 11/08/2021   HGB 13.1 11/08/2021   HCT 39.0 11/08/2021   MCV 88 11/08/2021   PLT 340 11/08/2021   Lab Results  Component Value Date   IRON 38 02/20/2020   TIBC 284 02/20/2020   FERRITIN 103 02/20/2020   Attestation Statements:   Reviewed by clinician on day of visit: allergies, medications, problem list, medical history, surgical history, family history, social history, and previous encounter notes.  Time spent on visit including pre-visit chart review and post-visit care and charting was 45 minutes.   I, Davy Pique, RMA, am acting as Location manager for [employer], DO.   I have reviewed the above documentation for accuracy and completeness, and I agree with the above. Marjory Sneddon, D.O.  [place] was signed into law in 2016 which includes the topic of electronic health records.  This provides immediate access to information in MyChart.  This includes consultation notes, operative notes, office notes, lab results and pathology reports.  If you have any questions about what you read please let us know at your next visit so we can discuss your concerns and take corrective action if need be.  We are right here with you.

## 2022-06-16 ENCOUNTER — Other Ambulatory Visit (INDEPENDENT_AMBULATORY_CARE_PROVIDER_SITE_OTHER): Payer: Self-pay | Admitting: Family Medicine

## 2022-06-16 DIAGNOSIS — E88819 Insulin resistance, unspecified: Secondary | ICD-10-CM

## 2022-06-21 ENCOUNTER — Other Ambulatory Visit: Payer: Self-pay | Admitting: Family

## 2022-06-21 ENCOUNTER — Ambulatory Visit (INDEPENDENT_AMBULATORY_CARE_PROVIDER_SITE_OTHER): Payer: 59 | Admitting: Family Medicine

## 2022-06-21 ENCOUNTER — Encounter (INDEPENDENT_AMBULATORY_CARE_PROVIDER_SITE_OTHER): Payer: Self-pay | Admitting: Family Medicine

## 2022-06-21 VITALS — BP 121/83 | HR 81 | Temp 98.6°F | Ht 63.0 in | Wt 232.0 lb

## 2022-06-21 DIAGNOSIS — E559 Vitamin D deficiency, unspecified: Secondary | ICD-10-CM

## 2022-06-21 DIAGNOSIS — F5089 Other specified eating disorder: Secondary | ICD-10-CM | POA: Diagnosis not present

## 2022-06-21 DIAGNOSIS — E88819 Insulin resistance, unspecified: Secondary | ICD-10-CM

## 2022-06-21 DIAGNOSIS — Z6841 Body Mass Index (BMI) 40.0 and over, adult: Secondary | ICD-10-CM | POA: Diagnosis not present

## 2022-06-21 MED ORDER — METFORMIN HCL 500 MG PO TABS: ORAL_TABLET | ORAL | 0 refills | Status: DC

## 2022-06-21 MED ORDER — TOPIRAMATE 50 MG PO TABS: 50.0000 mg | ORAL_TABLET | Freq: Two times a day (BID) | ORAL | 0 refills | Status: DC

## 2022-06-21 MED ORDER — VITAMIN D (ERGOCALCIFEROL) 1.25 MG (50000 UNIT) PO CAPS: 50000.0000 [IU] | ORAL_CAPSULE | ORAL | 0 refills | Status: DC

## 2022-06-23 DIAGNOSIS — Z6841 Body Mass Index (BMI) 40.0 and over, adult: Secondary | ICD-10-CM | POA: Insufficient documentation

## 2022-07-04 NOTE — Progress Notes (Signed)
Chief Complaint:   OBESITY Brittney Farmer is here to discuss her progress with her obesity treatment plan along with follow-up of her obesity related diagnoses. Brittney Farmer is on the Category 2 Plan and keeping a food journal and adhering to recommended goals of 1300-1400 calories and 90+ protein and states she is following her eating plan approximately 75% of the time. Brittney Farmer states she is going to they gym, walking 60 minutes 7 times per week.  Today's visit was #: 73 Starting weight: 235 lbs Starting date: 02/20/2020 Today's weight: 232 lbs Today's date: 06/21/2022 Total lbs lost to date: 3 lbs Total lbs lost since last in-office visit: 7 lbs  Interim History: Patient grandmother is 48 years old is in the hospital and patient under a lot of stress.  She lost 7 lbs in pure fat.  Patient is walking at lunch 2.5-3.0 miles each time and weight at gym every other day.  Breakfast protein shake with coffee, creamer.  Lunch salad with 2 tuna packets or chicken.   Subjective:   1. Insulin resistance Goal is HgbA1c < 5.7, fasting insulin closer to 5.    Lab Results  Component Value Date   HGBA1C 5.3 05/10/2022   Lab Results  Component Value Date   INSULIN 10.0 05/10/2022   INSULIN 12.5 11/11/2021   INSULIN 5.2 02/20/2020   2. Vitamin D deficiency She is currently taking prescription vitamin D 50,000 IU each week. She denies nausea, vomiting or muscle weakness.  3. Other disorder of eating-emotional eating She is doing pretty good despite stressors.   Assessment/Plan:  No orders of the defined types were placed in this encounter.   Medications Discontinued During This Encounter  Medication Reason   metFORMIN (GLUCOPHAGE) 500 MG tablet Reorder   topiramate (TOPAMAX) 50 MG tablet Reorder   Vitamin D, Ergocalciferol, (DRISDOL) 1.25 MG (50000 UNIT) CAPS capsule Reorder     Meds ordered this encounter  Medications   Vitamin D, Ergocalciferol, (DRISDOL) 1.25 MG (50000 UNIT) CAPS  capsule    Sig: Take 1 capsule (50,000 Units total) by mouth every 7 (seven) days.    Dispense:  4 capsule    Refill:  0   topiramate (TOPAMAX) 50 MG tablet    Sig: Take 1 tablet (50 mg total) by mouth 2 (two) times daily.    Dispense:  60 tablet    Refill:  0   metFORMIN (GLUCOPHAGE) 500 MG tablet    Sig: 1 tab twice daily with meals    Dispense:  60 tablet    Refill:  0    30 d supply;  ** OV for RF **   Do not send RF request     1. Insulin resistance Brittney Farmer will continue to work on weight loss, exercise, and decreasing simple carbohydrates to help decrease the risk of diabetes. Brittney Farmer agreed to follow-up with [place] as directed to closely monitor her progress.  Refill - metFORMIN (GLUCOPHAGE) 500 MG tablet; 1 tab twice daily with meals  Dispense: 60 tablet; Refill: 0  2. Vitamin D deficiency Low Vitamin D level contributes to fatigue and are associated with obesity, breast, and colon cancer. She agrees to continue to take prescription Vitamin D '@50'$ ,000 IU every week and will follow-up for routine testing of Vitamin D, at least 2-3 times per year to avoid over-replacement.  Refill - Vitamin D, Ergocalciferol, (DRISDOL) 1.25 MG (50000 UNIT) CAPS capsule; Take 1 capsule (50,000 Units total) by mouth every 7 (seven) days.  Dispense: 4  capsule; Refill: 0  3. Other disorder of eating-emotional eating Behavior modification techniques were discussed today to help Brittney Farmer deal with her emotional/non-hunger eating behaviors.  Orders and follow up as documented in patient record.   Refill - topiramate (TOPAMAX) 50 MG tablet; Take 1 tablet (50 mg total) by mouth 2 (two) times daily.  Dispense: 60 tablet; Refill: 0  4. BMI 40.0-44.9, adult (HCC)-current bmi 41.1  5. Morbid obesity (HCC)-start bmi 41.63 Brittney Farmer is currently in the action stage of change. As such, her goal is to continue with weight loss efforts. She has agreed to keeping a food journal and adhering to recommended goals of  1300-1400 calories and 90+ protein.   Exercise goals:  As is.   Behavioral modification strategies: no skipping meals and meal planning and cooking strategies.  Brittney Farmer has agreed to follow-up with our clinic in 3-4 weeks. She was informed of the importance of frequent follow-up visits to maximize her success with intensive lifestyle modifications for her multiple health conditions.   Objective:   Blood pressure 121/83, pulse 81, temperature 98.6 F (37 C), height '5\' 3"'$  (1.6 m), weight 232 lb (105.2 kg), SpO2 100 %. Body mass index is 41.1 kg/m.  General: Cooperative, alert, well developed, in no acute distress. HEENT: Conjunctivae and lids unremarkable. Cardiovascular: Regular rhythm.  Lungs: Normal work of breathing. Neurologic: No focal deficits.   Lab Results  Component Value Date   CREATININE 0.66 05/10/2022   BUN 12 05/10/2022   NA 139 05/10/2022   K 4.3 05/10/2022   CL 102 05/10/2022   CO2 23 05/10/2022   Lab Results  Component Value Date   ALT 9 05/10/2022   AST 12 05/10/2022   ALKPHOS 115 05/10/2022   BILITOT 0.5 05/10/2022   Lab Results  Component Value Date   HGBA1C 5.3 05/10/2022   HGBA1C 5.2 11/11/2021   HGBA1C 5.1 02/20/2020   Lab Results  Component Value Date   INSULIN 10.0 05/10/2022   INSULIN 12.5 11/11/2021   INSULIN 5.2 02/20/2020   Lab Results  Component Value Date   TSH 1.400 11/08/2021   Lab Results  Component Value Date   CHOL 225 (H) 05/10/2022   HDL 47 05/10/2022   LDLCALC 166 (H) 05/10/2022   TRIG 70 05/10/2022   CHOLHDL 4.8 (H) 05/10/2022   Lab Results  Component Value Date   VD25OH 27.7 (L) 05/10/2022   VD25OH 41.2 11/11/2021   VD25OH 40.6 05/12/2021   Lab Results  Component Value Date   WBC 8.7 11/08/2021   HGB 13.1 11/08/2021   HCT 39.0 11/08/2021   MCV 88 11/08/2021   PLT 340 11/08/2021   Lab Results  Component Value Date   IRON 38 02/20/2020   TIBC 284 02/20/2020   FERRITIN 103 02/20/2020   Attestation  Statements:   Reviewed by clinician on day of visit: allergies, medications, problem list, medical history, surgical history, family history, social history, and previous encounter notes.  I, Davy Pique, RMA, am acting as Location manager for [employer], DO.  I have reviewed the above documentation for accuracy and completeness, and I agree with the above. Marjory Sneddon, D.O.  The [place] was signed into law in 2016 which includes the topic of electronic health records.  This provides immediate access to information in MyChart.  This includes consultation notes, operative notes, office notes, lab results and pathology reports.  If you have any questions about what you read please let us know at your next  visit so we can discuss your concerns and take corrective action if need be.  We are right here with you.

## 2022-07-12 ENCOUNTER — Encounter: Payer: Self-pay | Admitting: Family

## 2022-07-12 ENCOUNTER — Ambulatory Visit (INDEPENDENT_AMBULATORY_CARE_PROVIDER_SITE_OTHER): Payer: 59 | Admitting: Family

## 2022-07-12 VITALS — BP 123/79 | HR 75 | Temp 98.2°F | Ht 63.0 in | Wt 230.6 lb

## 2022-07-12 DIAGNOSIS — E7849 Other hyperlipidemia: Secondary | ICD-10-CM | POA: Diagnosis not present

## 2022-07-12 DIAGNOSIS — Z6841 Body Mass Index (BMI) 40.0 and over, adult: Secondary | ICD-10-CM

## 2022-07-12 DIAGNOSIS — F411 Generalized anxiety disorder: Secondary | ICD-10-CM

## 2022-07-12 DIAGNOSIS — Z0000 Encounter for general adult medical examination without abnormal findings: Secondary | ICD-10-CM | POA: Diagnosis not present

## 2022-07-12 DIAGNOSIS — Z1159 Encounter for screening for other viral diseases: Secondary | ICD-10-CM

## 2022-07-12 DIAGNOSIS — K219 Gastro-esophageal reflux disease without esophagitis: Secondary | ICD-10-CM | POA: Diagnosis not present

## 2022-07-12 DIAGNOSIS — Z0001 Encounter for general adult medical examination with abnormal findings: Secondary | ICD-10-CM | POA: Diagnosis not present

## 2022-07-12 DIAGNOSIS — D649 Anemia, unspecified: Secondary | ICD-10-CM

## 2022-07-12 DIAGNOSIS — F331 Major depressive disorder, recurrent, moderate: Secondary | ICD-10-CM

## 2022-07-12 DIAGNOSIS — E66813 Obesity, class 3: Secondary | ICD-10-CM

## 2022-07-12 MED ORDER — OMEPRAZOLE 40 MG PO CPDR: 40.0000 mg | DELAYED_RELEASE_CAPSULE | Freq: Every day | ORAL | 2 refills | Status: DC

## 2022-07-12 NOTE — Progress Notes (Signed)
Subjective:    Patient ID: Brittney Farmer, female    DOB: 1985/08/26, 37 y.o.   MRN: [id number]  Chief Complaint  Patient presents with   Medical Management of Chronic Issues    Pt presents to the office today for CPE and chronic follow up.    She was going  healthy weight loss clinic. She is morbid obese with a BMI of 40 with depression, GAD, and hyperlipidemia. She reports she has lost 20 lbs.    She is followed by Cardiologists for  palpitations. Gastroesophageal Reflux She complains of belching and heartburn. This is a chronic problem. The current episode started more than 1 year ago. The problem occurs occasionally. The problem has been waxing and waning. Risk factors include obesity. She has tried a PPI for the symptoms. The treatment provided moderate relief.  Anxiety Presents for follow-up visit. Symptoms include excessive worry, irritability, nervous/anxious behavior and restlessness. Symptoms occur occasionally. The severity of symptoms is mild.   Her past medical history is significant for anemia.  Depression        This is a chronic problem.  The current episode started more than 1 year ago.   The onset quality is gradual.   The problem occurs intermittently.  Associated symptoms include restlessness and sad.  Associated symptoms include no helplessness and no hopelessness.  Past treatments include nothing.  Past medical history includes anxiety.   Anemia Presents for follow-up visit. There has been no malaise/fatigue.      Review of Systems  Constitutional:  Positive for irritability. Negative for malaise/fatigue.  Gastrointestinal:  Positive for heartburn.  Psychiatric/Behavioral:  Positive for depression. The patient is nervous/anxious.   All other systems reviewed and are negative.  Family History  Problem Relation Age of Onset   Diabetes Mother    Hyperlipidemia Mother    Hypertension Mother    Heart disease Mother    Depression Mother    Anxiety disorder  Mother    Alcoholism Mother    Hyperlipidemia Father    Hypertension Father    Alcoholism Father    Celiac disease Neg Hx    Inflammatory bowel disease Neg Hx    Colon cancer Neg Hx    Social History   Socioeconomic History   Marital status: Single    Spouse name: Brittney Farmer   Number of children: 4   Years of education: Not on file   Highest education level: Not on file  Occupational History   Occupation: Network engineer  Tobacco Use   Smoking status: Never   Smokeless tobacco: Never  Vaping Use   Vaping Use: Never used  Substance and Sexual Activity   Alcohol use: No   Drug use: No   Sexual activity: Yes  Other Topics Concern   Not on file  Social History Narrative   Not on file   Social Determinants of Health   Financial Resource Strain: Low Risk  (11/22/2017)   Overall Financial Resource Strain (CARDIA)    Difficulty of Paying Living Expenses: Not hard at all  Food Insecurity: No Food Insecurity (11/22/2017)   Hunger Vital Sign    Worried About Running Out of Food in the Last Year: Never true    Ran Out of Food in the Last Year: Never true  Transportation Needs: No Transportation Needs (11/22/2017)   PRAPARE - Hydrologist (Medical): No    Lack of Transportation (Non-Medical): No  Physical Activity: Inactive (11/22/2017)   Exercise Vital Sign  Days of Exercise per Week: 0 days    Minutes of Exercise per Session: 0 min  Stress: No Stress Concern Present (11/22/2017)   [place]    Feeling of Stress : Not at all  Social Connections: Somewhat Isolated (11/22/2017)   Social Connection and Isolation Panel [NHANES]    Frequency of Communication with Friends and Family: More than three times a week    Frequency of Social Gatherings with Friends and Family: More than three times a week    Attends Religious Services: Never    Marine scientist or Organizations: No     Attends Archivist Meetings: Never    Marital Status: Married       Objective:   Physical Exam Vitals reviewed.  Constitutional:      General: She is not in acute distress.    Appearance: She is well-developed. She is obese.  HENT:     Head: Normocephalic and atraumatic.     Right Ear: Tympanic membrane normal.     Left Ear: Tympanic membrane normal.  Eyes:     Pupils: Pupils are equal, round, and reactive to light.  Neck:     Thyroid: No thyromegaly.  Cardiovascular:     Rate and Rhythm: Normal rate and regular rhythm.     Heart sounds: Normal heart sounds. No murmur heard. Pulmonary:     Effort: Pulmonary effort is normal. No respiratory distress.     Breath sounds: Normal breath sounds. No wheezing.  Abdominal:     General: Bowel sounds are normal. There is no distension.     Palpations: Abdomen is soft.     Tenderness: There is no abdominal tenderness.  Musculoskeletal:        General: No tenderness. Normal range of motion.     Cervical back: Normal range of motion and neck supple.  Skin:    General: Skin is warm and dry.  Neurological:     Mental Status: She is alert and oriented to person, place, and time.     Cranial Nerves: No cranial nerve deficit.     Deep Tendon Reflexes: Reflexes are normal and symmetric.  Psychiatric:        Behavior: Behavior normal.        Thought Content: Thought content normal.        Judgment: Judgment normal.       BP 123/79   Pulse 75   Temp 98.2 F (36.8 C) (Temporal)   Ht '5\' 3"'$  (1.6 m)   Wt 230 lb 9.6 oz (104.6 kg)   SpO2 98%   BMI 40.85 kg/m      Assessment & Plan:  Brittney Farmer comes in today with chief complaint of Medical Management of Chronic Issues   Diagnosis and orders addressed:  1. Gastroesophageal reflux disease without esophagitis - omeprazole (PRILOSEC) 40 MG capsule; Take 1 capsule (40 mg total) by mouth daily.  Dispense: 90 capsule; Refill: 2 - CMP14+EGFR - CBC with  Differential/Platelet  2. GAD (generalized anxiety disorder) - CMP14+EGFR - CBC with Differential/Platelet  3. Morbid obesity ([place]) - CMP14+EGFR - CBC with Differential/Platelet  4. Anemia, unspecified type - CMP14+EGFR - CBC with Differential/Platelet  5. Other hyperlipidemia - CMP14+EGFR - CBC with Differential/Platelet  6. Moderate episode of recurrent major depressive disorder (HCC) - CMP14+EGFR - CBC with Differential/Platelet  7. Class 3 severe obesity due to excess calories with serious comorbidity and body mass index (BMI) of 40.0  to 44.9 in adult [facility]) - CMP14+EGFR - CBC with Differential/Platelet  8. Annual physical exam - CMP14+EGFR - CBC with Differential/Platelet - Lipid panel - TSH - Hepatitis C antibody  9. Need for hepatitis C screening test - CMP14+EGFR - CBC with Differential/Platelet - Hepatitis C antibody   Labs pending Health Maintenance reviewed Diet and exercise encouraged  Follow up plan: 6 months    Evelina Dun, FNP

## 2022-07-12 NOTE — Patient Instructions (Signed)

## 2022-07-13 LAB — CMP14+EGFR
ALT: 10 IU/L (ref 0–32)
AST: 13 IU/L (ref 0–40)
Albumin/Globulin Ratio: 2 (ref 1.2–2.2)
Albumin: 4.2 g/dL (ref 3.9–4.9)
Alkaline Phosphatase: 87 IU/L (ref 44–121)
BUN/Creatinine Ratio: 10 (ref 9–23)
BUN: 8 mg/dL (ref 6–20)
Bilirubin Total: 0.3 mg/dL (ref 0.0–1.2)
CO2: 20 mmol/L (ref 20–29)
Calcium: 9.3 mg/dL (ref 8.7–10.2)
Chloride: 105 mmol/L (ref 96–106)
Creatinine, Ser: 0.77 mg/dL (ref 0.57–1.00)
Globulin, Total: 2.1 g/dL (ref 1.5–4.5)
Glucose: 89 mg/dL (ref 70–99)
Potassium: 4.8 mmol/L (ref 3.5–5.2)
Sodium: 139 mmol/L (ref 134–144)
Total Protein: 6.3 g/dL (ref 6.0–8.5)
eGFR: 104 mL/min/{1.73_m2} (ref >59)

## 2022-07-13 LAB — CBC WITH DIFFERENTIAL/PLATELET
Basophils Absolute: 0 10*3/uL (ref 0.0–0.2)
Basos: 0 %
EOS (ABSOLUTE): 0.1 10*3/uL (ref 0.0–0.4)
Eos: 2 %
Hematocrit: 41.5 % (ref 34.0–46.6)
Hemoglobin: 13.3 g/dL (ref 11.1–15.9)
Immature Grans (Abs): 0 10*3/uL (ref 0.0–0.1)
Immature Granulocytes: 0 %
Lymphocytes Absolute: 1.8 10*3/uL (ref 0.7–3.1)
Lymphs: 26 %
MCH: 28.3 pg (ref 26.6–33.0)
MCHC: 32 g/dL (ref 31.5–35.7)
MCV: 88 fL (ref 79–97)
Monocytes Absolute: 0.4 10*3/uL (ref 0.1–0.9)
Monocytes: 6 %
Neutrophils Absolute: 4.4 10*3/uL (ref 1.4–7.0)
Neutrophils: 66 %
Platelets: 320 10*3/uL (ref 150–450)
RBC: 4.7 x10E6/uL (ref 3.77–5.28)
RDW: 13.7 % (ref 11.7–15.4)
WBC: 6.8 10*3/uL (ref 3.4–10.8)

## 2022-07-13 LAB — LIPID PANEL
Chol/HDL Ratio: 3.4 ratio (ref 0.0–4.4)
Cholesterol, Total: 162 mg/dL (ref 100–199)
HDL: 48 mg/dL (ref >39)
LDL Chol Calc (NIH): 101 mg/dL — ABNORMAL HIGH (ref 0–99)
Triglycerides: 66 mg/dL (ref 0–149)
VLDL Cholesterol Cal: 13 mg/dL (ref 5–40)

## 2022-07-13 LAB — TSH: TSH: 1.22 u[IU]/mL (ref 0.450–4.500)

## 2022-07-13 LAB — HEPATITIS C ANTIBODY: Hep C Virus Ab: NONREACTIVE

## 2022-07-19 ENCOUNTER — Encounter (INDEPENDENT_AMBULATORY_CARE_PROVIDER_SITE_OTHER): Payer: Self-pay | Admitting: Family Medicine

## 2022-07-19 ENCOUNTER — Ambulatory Visit (INDEPENDENT_AMBULATORY_CARE_PROVIDER_SITE_OTHER): Payer: 59 | Admitting: Family Medicine

## 2022-07-19 VITALS — BP 121/84 | HR 71 | Temp 98.3°F | Ht 63.0 in | Wt 223.4 lb

## 2022-07-19 DIAGNOSIS — E559 Vitamin D deficiency, unspecified: Secondary | ICD-10-CM

## 2022-07-19 DIAGNOSIS — E88819 Insulin resistance, unspecified: Secondary | ICD-10-CM

## 2022-07-19 DIAGNOSIS — F5089 Other specified eating disorder: Secondary | ICD-10-CM

## 2022-07-19 DIAGNOSIS — Z6841 Body Mass Index (BMI) 40.0 and over, adult: Secondary | ICD-10-CM

## 2022-07-19 DIAGNOSIS — Z7984 Long term (current) use of oral hypoglycemic drugs: Secondary | ICD-10-CM

## 2022-07-19 DIAGNOSIS — E7849 Other hyperlipidemia: Secondary | ICD-10-CM

## 2022-07-19 DIAGNOSIS — Z6839 Body mass index (BMI) 39.0-39.9, adult: Secondary | ICD-10-CM

## 2022-07-19 MED ORDER — VITAMIN D (ERGOCALCIFEROL) 1.25 MG (50000 UNIT) PO CAPS: 50000.0000 [IU] | ORAL_CAPSULE | ORAL | 0 refills | Status: DC

## 2022-07-19 MED ORDER — METFORMIN HCL 500 MG PO TABS: ORAL_TABLET | ORAL | 0 refills | Status: DC

## 2022-07-19 MED ORDER — TOPIRAMATE 50 MG PO TABS: 50.0000 mg | ORAL_TABLET | Freq: Two times a day (BID) | ORAL | 0 refills | Status: DC

## 2022-07-19 NOTE — Progress Notes (Signed)
Brittney Farmer, D.O.  ABFM, ABOM Specializing in Clinical Bariatric Medicine  Office located at: [address], [place]  [address]     Assessment and Plan:   No orders of the defined types were placed in this encounter.   Medications Discontinued During This Encounter  Medication Reason   Vitamin D, Ergocalciferol, (DRISDOL) 1.25 MG (50000 UNIT) CAPS capsule Reorder   topiramate (TOPAMAX) 50 MG tablet Reorder   metFORMIN (GLUCOPHAGE) 500 MG tablet Reorder     Meds ordered this encounter  Medications   metFORMIN (GLUCOPHAGE) 500 MG tablet    Sig: 1.5 tab twice daily with meals    Dispense:  90 tablet    Refill:  0    30 d supply;  ** OV for RF **   Do not send RF request   topiramate (TOPAMAX) 50 MG tablet    Sig: Take 1 tablet (50 mg total) by mouth 2 (two) times daily.    Dispense:  60 tablet    Refill:  0   Vitamin D, Ergocalciferol, (DRISDOL) 1.25 MG (50000 UNIT) CAPS capsule    Sig: Take 1 capsule (50,000 Units total) by mouth every 7 (seven) days.    Dispense:  4 capsule    Refill:  0     BMI 40.0-44.9, adult (HCC)-current bmi 39.6 Morbid obesity (HCC)-start bmi 41.63/date 02/20/20  Assessment: Condition is At goal.. Biometric data collected today, was reviewed with patient.  Fat mass has decreased by 7.4lbs. Muscle mass has decreased by 1.2lbs. Total body water has decreased by 1.8 lbs.   Plan: Continue with Journaling 1300-1440 calories with 90g protein.  I advised patient to measure out her water quantity and and increasing her protein intake.    Insulin resistance  Assessment: Condition is Improving, but not optimized.. Labs were reviewed.  Lab Results  Component Value Date   HGBA1C 5.3 05/10/2022   HGBA1C 5.2 11/11/2021   HGBA1C 5.1 02/20/2020   INSULIN 10.0 05/10/2022   INSULIN 12.5 11/11/2021   INSULIN 5.2 02/20/2020  Patient is complaint with Metformin 500 mg BID. Denies any side effects.  Plan:  Increase Metformin to 1.5 tab  daily.  - Brittney Farmer will continue to work on weight loss, exercise, via their meal plan we devised to help decrease the risk of progressing to diabetes.    Other disorder of eating-emotional eating  Assessment: Condition is Improving, but not optimized.. Mood is stable. Her cravings and hunger have increased slightly.   Patient is compliant with Topomax 50mg  BID.  Plan: Continue with Topomax 50mg  BID. I also recommended patient to continue eating enough protein to help with cravings.    Vitamin D deficiency  Assessment: Condition is Improving, but not optimized.. Labs were reviewed.  Lab Results  Component Value Date   VD25OH 27.7 (L) 05/10/2022   VD25OH 41.2 11/11/2021   VD25OH 40.6 05/12/2021  Patient is compliant with Ergocalciferol 50K IU weekly. Denies any side effects.   Plan: Continue with Ergocalciferol 50K IU weekly  - I discussed the importance of vitamin D to the patient's health and well-being.  - I reviewed possible symptoms of low Vitamin D:  low energy, depressed mood, muscle aches, joint aches, osteoporosis etc. was reviewed with patient - low Vitamin D levels may be linked to an increased risk of cardiovascular events and even increased risk of cancers- such as colon and breast.  - ideal vitamin D levels reviewed with patient  - Informed patient this may be  a lifelong thing, and she was encouraged to continue to take the medicine until told otherwise.    - weight loss will likely improve availability of vitamin D, thus encouraged Brittney Farmer to continue with meal plan and their weight loss efforts to further improve this condition.  Thus, we will need to monitor levels regularly (every 3-4 mo on average) to keep levels within normal limits and prevent over supplementation. - pt's questions and concerns regarding this condition addressed.    Other hyperlipidemia  Assessment: Condition is Improving, but not optimized.. Labs were reviewed.  Lab Results  Component Value Date    CHOL 162 07/12/2022   HDL 48 07/12/2022   LDLCALC 101 (H) 07/12/2022   TRIG 66 07/12/2022   CHOLHDL 3.4 07/12/2022   Lab Results  Component Value Date   ALT 10 07/12/2022   AST 13 07/12/2022   ALKPHOS 87 07/12/2022   BILITOT 0.3 07/12/2022  Her LDL was 166 2 months ago and her most recent LDL (07/12/22) was 101. Her hyperlipidemia is not controlled with medication, diet controlled.  Plan: Continue treatment of Hyperlipidemia with current nutritional plan and exercise regiment.      TREATMENT PLAN FOR OBESITY:  Recommended Dietary Goals Brittney Farmer is currently in the action stage of change. As such, her goal is to continue weight management plan. She has agreed to continue keeping a food journal and adhering to recommended goals of 1300-1400 calories and 90 protein.  Behavioral Intervention Additional resources provided today: patient declined Evidence-based interventions for health behavior change were utilized today including the discussion of self monitoring techniques, problem-solving barriers and SMART goal setting techniques.   Regarding patient's less desirable eating habits and patterns, we employed the technique of small changes.  Pt will specifically work on: increasing protein and water intake. for next visit.    Recommended Physical Activity Goals Brittney Farmer has been advised to work up to 150 minutes of moderate intensity aerobic activity a week and strengthening exercises 2-3 times per week for cardiovascular health, weight loss maintenance and preservation of muscle mass.  She has agreed to Continue current level of physical activity   P FOLLOW UP: Return in about 3 weeks (around 08/09/2022). She was informed of the importance of frequent follow up visits to maximize her success with intensive lifestyle modifications for her multiple health conditions.  Subjective:   Chief complaint: Obesity Brittney Farmer is here to discuss her progress with her obesity treatment plan. She is  on the keeping a food journal and adhering to recommended goals of 1300-1400  calories and 90 protein and states she is following her eating plan approximately 75% of the time. She states she is exercising 60 minutes 7 days per week.  Interval History:  Brittney Farmer is here for a follow up office visit. Since last office visit she has been doing really well. She is following her diet plan, and going to the gym and exercising. We reviewed her meal plan and all questions were answered. Patient's food recall appears to be accurate and consistent with what is on plan when she is following it. When eating on plan, her hunger and cravings are well controlled.      Review of Systems:  Pertinent positives were addressed with patient today.  Weight Summary and Biometrics   Weight Lost Since Last Visit: 9lb  Weight Gained Since Last Visit: 0   Vitals Temp: 98.3 F (36.8 C) BP: 121/84 Pulse Rate: 71 SpO2: 100 %   Anthropometric Measurements Height: 5\' 3"  (  1.6 m) Weight: 223 lb 6.4 oz (101.3 kg) BMI (Calculated): 39.58 Weight at Last Visit: 232lb Weight Lost Since Last Visit: 9lb Weight Gained Since Last Visit: 0 Starting Weight: 235lb Total Weight Loss (lbs): 12 lb (5.443 kg) Peak Weight: 256lb   Body Composition  Body Fat %: 44.6 % Fat Mass (lbs): 99.6 lbs Muscle Mass (lbs): 117.6 lbs Total Body Water (lbs): 83.8 lbs Visceral Fat Rating : 11   Other Clinical Data Fasting: yes Labs: no Today's Visit #: 27 Starting Date: 02/20/20    Objective:   PHYSICAL EXAM:  Blood pressure 121/84, pulse 71, temperature 98.3 F (36.8 C), height 5\' 3"  (1.6 m), weight 223 lb 6.4 oz (101.3 kg), SpO2 100 %. Body mass index is 39.57 kg/m.  General: Well Developed, well nourished, and in no acute distress.  HEENT: Normocephalic, atraumatic Skin: Warm and dry, cap RF less 2 sec, good turgor Chest:  Normal excursion, shape, no gross abn Respiratory: speaking in full sentences, no  conversational dyspnea NeuroM-Sk: Ambulates w/o assistance, moves * 4 Psych: A and O *3, insight good, mood-full  DIAGNOSTIC DATA REVIEWED:  BMET    Component Value Date/Time   NA 139 07/12/2022 0912   K 4.8 07/12/2022 0912   CL 105 07/12/2022 0912   CO2 20 07/12/2022 0912   GLUCOSE 89 07/12/2022 0912   GLUCOSE 75 10/04/2012 1238   BUN 8 07/12/2022 0912   CREATININE 0.77 07/12/2022 0912   CREATININE 0.59 10/04/2012 1238   CALCIUM 9.3 07/12/2022 0912   GFRNONAA 111 02/20/2020 1012   GFRNONAA >89 10/04/2012 1238   GFRAA 128 02/20/2020 1012   GFRAA >89 10/04/2012 1238   Lab Results  Component Value Date   HGBA1C 5.3 05/10/2022   HGBA1C 5.1 02/20/2020   Lab Results  Component Value Date   INSULIN 10.0 05/10/2022   INSULIN 5.2 02/20/2020   Lab Results  Component Value Date   TSH 1.220 07/12/2022   CBC    Component Value Date/Time   WBC 6.8 07/12/2022 0912   WBC 7.8 10/18/2021 0820   RBC 4.70 07/12/2022 0912   RBC 4.52 10/18/2021 0820   HGB 13.3 07/12/2022 0912   HCT 41.5 07/12/2022 0912   PLT 320 07/12/2022 0912   MCV 88 07/12/2022 0912   MCH 28.3 07/12/2022 0912   MCH 29.2 10/18/2021 0820   MCHC 32.0 07/12/2022 0912   MCHC 32.7 10/18/2021 0820   RDW 13.7 07/12/2022 0912   Iron Studies    Component Value Date/Time   IRON 38 02/20/2020 1012   TIBC 284 02/20/2020 1012   FERRITIN 103 02/20/2020 1012   IRONPCTSAT 13 (L) 02/20/2020 1012   Lipid Panel     Component Value Date/Time   CHOL 162 07/12/2022 0912   TRIG 66 07/12/2022 0912   HDL 48 07/12/2022 0912   CHOLHDL 3.4 07/12/2022 0912   LDLCALC 101 (H) 07/12/2022 0912   Hepatic Function Panel     Component Value Date/Time   PROT 6.3 07/12/2022 0912   ALBUMIN 4.2 07/12/2022 0912   AST 13 07/12/2022 0912   ALT 10 07/12/2022 0912   ALKPHOS 87 07/12/2022 0912   BILITOT 0.3 07/12/2022 0912   BILIDIR 0.1 10/04/2012 1238   IBILI 0.2 10/04/2012 1238      Component Value Date/Time   TSH 1.220  07/12/2022 0912   Nutritional Lab Results  Component Value Date   VD25OH 27.7 (L) 05/10/2022   VD25OH 41.2 11/11/2021   VD25OH 40.6 05/12/2021  Attestations:   Reviewed by clinician on day of visit: allergies, medications, problem list, medical history, surgical history, family history, social history, and previous encounter notes.  I,Special Puri,acting as a Education administrator for [employer], DO.,have documented all relevant documentation on the behalf of Brittney Dance, DO,as directed by  Brittney Dance, DO while in the presence of Brittney Dance, DO.   I, Brittney Dance, DO, have reviewed all documentation for this visit. The documentation on 07/20/22 for the exam, diagnosis, procedures, and orders are all accurate and complete.

## 2022-07-24 ENCOUNTER — Telehealth: Payer: Self-pay | Admitting: Family

## 2022-07-24 DIAGNOSIS — K219 Gastro-esophageal reflux disease without esophagitis: Secondary | ICD-10-CM

## 2022-07-25 NOTE — Telephone Encounter (Signed)
Name from pharmacy: [facility]MEPRAZOLE DR 40 MG CAPSULE  Pharmacy comment: Alternative Requested:PRIOR AUTHORIZATION NEEDED FOR MORE THAN 90 TABLETS EVERY 365 DAYS.

## 2022-07-29 ENCOUNTER — Other Ambulatory Visit (HOSPITAL_COMMUNITY): Payer: Self-pay

## 2022-08-09 ENCOUNTER — Other Ambulatory Visit (HOSPITAL_COMMUNITY): Payer: Self-pay

## 2022-08-09 ENCOUNTER — Ambulatory Visit (INDEPENDENT_AMBULATORY_CARE_PROVIDER_SITE_OTHER): Payer: 59 | Admitting: Physician Assistant

## 2022-08-09 ENCOUNTER — Encounter (INDEPENDENT_AMBULATORY_CARE_PROVIDER_SITE_OTHER): Payer: Self-pay | Admitting: Physician Assistant

## 2022-08-09 VITALS — BP 110/71 | HR 68 | Temp 98.5°F | Ht 63.0 in | Wt 219.0 lb

## 2022-08-09 DIAGNOSIS — F5089 Other specified eating disorder: Secondary | ICD-10-CM | POA: Diagnosis not present

## 2022-08-09 DIAGNOSIS — E88819 Insulin resistance, unspecified: Secondary | ICD-10-CM

## 2022-08-09 DIAGNOSIS — E559 Vitamin D deficiency, unspecified: Secondary | ICD-10-CM

## 2022-08-09 DIAGNOSIS — Z6838 Body mass index (BMI) 38.0-38.9, adult: Secondary | ICD-10-CM | POA: Diagnosis not present

## 2022-08-09 MED ORDER — VITAMIN D (ERGOCALCIFEROL) 1.25 MG (50000 UNIT) PO CAPS
50000.0000 [IU] | ORAL_CAPSULE | ORAL | 0 refills | Status: DC
Start: 2022-08-09 — End: 2022-08-30

## 2022-08-09 MED ORDER — METFORMIN HCL 500 MG PO TABS: ORAL_TABLET | ORAL | 0 refills | Status: DC

## 2022-08-09 MED ORDER — TOPIRAMATE 50 MG PO TABS: 50.0000 mg | ORAL_TABLET | Freq: Two times a day (BID) | ORAL | 0 refills | Status: DC

## 2022-08-09 NOTE — Assessment & Plan Note (Signed)
Insulin Resistance Last fasting insulin was 10.0 not at goal. A1c was 5.3 at goal. Polyphagia:Yes Medication(s): metformin 750 mg twice daily. No side effects with metformin.  Lab Results  Component Value Date   HGBA1C 5.3 05/10/2022   HGBA1C 5.2 11/11/2021   HGBA1C 5.1 02/20/2020   Lab Results  Component Value Date   INSULIN 10.0 05/10/2022   INSULIN 12.5 11/11/2021   INSULIN 5.2 02/20/2020    Plan: Continue and refill  metformin 750 mg twice daily.  Continue working on nutrition plan to decrease simple carbohydrates, increase lean proteins and exercise to promote weight loss, improve glycemic control and prevent progression to Type 2 diabetes.  Increase protein intake. Increase fiber intake.

## 2022-08-09 NOTE — Assessment & Plan Note (Signed)
Vitamin D Deficiency Vitamin D is not at goal of 50.  Most recent vitamin D level was 27.7. She is on  prescription ergocalciferol 50,000 IU weekly. Lab Results  Component Value Date   VD25OH 27.7 (L) 05/10/2022   VD25OH 41.2 11/11/2021   VD25OH 40.6 05/12/2021    Plan: Continue and refill  prescription ergocalciferol 50,000 IU weekly Low vitamin D levels can be associated with adiposity and may result in leptin resistance and weight gain. Also associated with fatigue. Currently on vitamin D supplementation without any adverse effects.

## 2022-08-09 NOTE — Progress Notes (Signed)
Office: [phone]  /  Fax: [phone]  WEIGHT SUMMARY AND BIOMETRICS  Vitals Temp: 98.5 F (36.9 C) BP: 110/71 Pulse Rate: 68 SpO2: 93 %   Anthropometric Measurements Height: 5\' 3"  (1.6 m) Weight: 219 lb (99.3 kg) BMI (Calculated): 38.8 Weight at Last Visit: 223 lb Weight Lost Since Last Visit: 4 lb Weight Gained Since Last Visit: 0 Starting Weight: 235 lb Total Weight Loss (lbs): 16 lb (7.258 kg)   Body Composition  Body Fat %: 44.6 % Fat Mass (lbs): 97.6 lbs Muscle Mass (lbs): 115.2 lbs Total Body Water (lbs): 84 lbs Visceral Fat Rating : 11   Other Clinical Data Labs: no Today's Visit #: 28 Starting Date: 02/20/20     HPI  Chief Complaint: OBESITY  Brittney Farmer is here to discuss her progress with her obesity treatment plan. She is on the keeping a food journal and adhering to recommended goals of 1300-1400 calories and 90 grams of protein and states she is following her eating plan approximately 75 % of the time. She states she is exercising/ 60 minutes 5 times per week.   Interval History:  Since last office visit she is down 4 lbs./ Down total of 16 lbs.  Reports very good adherence to breakfast and lunch, but has more difficulty with dinners with feeding 4 children (Ages 62, 60, 6 and 3) and using portion control at dinner time. Not really journaling, so using Cat 2 as guide for meals.  Exercising regularly with niece who is also on weight loss journey- Upper body lifting limited by right shoulder pain-  Has AWV with PCP- Dr. Lendon Colonel next week and will have shoulder evaluated as has been problem for several months now.   Pharmacotherapy: metformin for insulin resistance. No side effects with metformin.    Topamax for cravings- no side effects.   PHYSICAL EXAM:  Blood pressure 110/71, pulse 68, temperature 98.5 F (36.9 C), height 5\' 3"  (1.6 m), weight 219 lb (99.3 kg), SpO2 93 %. Body mass index is 38.79 kg/m.  General: She is overweight,  cooperative, alert, well developed, and in no acute distress. PSYCH: Has normal mood, affect and thought process.   Cardiovascular: regular rhythm Lungs: Normal breathing effort, no conversational dyspnea. Neuro: no focal deficits  DIAGNOSTIC DATA REVIEWED:  BMET    Component Value Date/Time   NA 139 07/12/2022 0912   K 4.8 07/12/2022 0912   CL 105 07/12/2022 0912   CO2 20 07/12/2022 0912   GLUCOSE 89 07/12/2022 0912   GLUCOSE 75 10/04/2012 1238   BUN 8 07/12/2022 0912   CREATININE 0.77 07/12/2022 0912   CREATININE 0.59 10/04/2012 1238   CALCIUM 9.3 07/12/2022 0912   GFRNONAA 111 02/20/2020 1012   GFRNONAA >89 10/04/2012 1238   GFRAA 128 02/20/2020 1012   GFRAA >89 10/04/2012 1238   Lab Results  Component Value Date   HGBA1C 5.3 05/10/2022   HGBA1C 5.1 02/20/2020   Lab Results  Component Value Date   INSULIN 10.0 05/10/2022   INSULIN 5.2 02/20/2020   Lab Results  Component Value Date   TSH 1.220 07/12/2022   CBC    Component Value Date/Time   WBC 6.8 07/12/2022 0912   WBC 7.8 10/18/2021 0820   RBC 4.70 07/12/2022 0912   RBC 4.52 10/18/2021 0820   HGB 13.3 07/12/2022 0912   HCT 41.5 07/12/2022 0912   PLT 320 07/12/2022 0912   MCV 88 07/12/2022 0912   MCH 28.3 07/12/2022 0912   MCH 29.2 10/18/2021  0820   MCHC 32.0 07/12/2022 0912   MCHC 32.7 10/18/2021 0820   RDW 13.7 07/12/2022 0912   Iron Studies    Component Value Date/Time   IRON 38 02/20/2020 1012   TIBC 284 02/20/2020 1012   FERRITIN 103 02/20/2020 1012   IRONPCTSAT 13 (L) 02/20/2020 1012   Lipid Panel     Component Value Date/Time   CHOL 162 07/12/2022 0912   TRIG 66 07/12/2022 0912   HDL 48 07/12/2022 0912   CHOLHDL 3.4 07/12/2022 0912   LDLCALC 101 (H) 07/12/2022 0912   Hepatic Function Panel     Component Value Date/Time   PROT 6.3 07/12/2022 0912   ALBUMIN 4.2 07/12/2022 0912   AST 13 07/12/2022 0912   ALT 10 07/12/2022 0912   ALKPHOS 87 07/12/2022 0912   BILITOT 0.3  07/12/2022 0912   BILIDIR 0.1 10/04/2012 1238   IBILI 0.2 10/04/2012 1238      Component Value Date/Time   TSH 1.220 07/12/2022 0912   Nutritional Lab Results  Component Value Date   VD25OH 27.7 (L) 05/10/2022   VD25OH 41.2 11/11/2021   VD25OH 40.6 05/12/2021    ASSOCIATED CONDITIONS ADDRESSED TODAY  ASSESSMENT AND PLAN  Problem List Items Addressed This Visit     Morbid obesity   Relevant Medications   metFORMIN (GLUCOPHAGE) 500 MG tablet   Vitamin D deficiency    Vitamin D Deficiency Vitamin D is not at goal of 50.  Most recent vitamin D level was 27.7. She is on  prescription ergocalciferol 50,000 IU weekly. Lab Results  Component Value Date   VD25OH 27.7 (L) 05/10/2022   VD25OH 41.2 11/11/2021   VD25OH 40.6 05/12/2021   Plan: Continue and refill  prescription ergocalciferol 50,000 IU weekly Low vitamin D levels can be associated with adiposity and may result in leptin resistance and weight gain. Also associated with fatigue. Currently on vitamin D supplementation without any adverse effects.         Relevant Medications   Vitamin D, Ergocalciferol, (DRISDOL) 1.25 MG (50000 UNIT) CAPS capsule   Insulin resistance - Primary    Insulin Resistance Last fasting insulin was 10.0 not at goal. A1c was 5.3 at goal. Polyphagia:Yes Medication(s): metformin 750 mg twice daily. No side effects with metformin.  Lab Results  Component Value Date   HGBA1C 5.3 05/10/2022   HGBA1C 5.2 11/11/2021   HGBA1C 5.1 02/20/2020   Lab Results  Component Value Date   INSULIN 10.0 05/10/2022   INSULIN 12.5 11/11/2021   INSULIN 5.2 02/20/2020   Plan: Continue and refill  metformin 750 mg twice daily.  Continue working on nutrition plan to decrease simple carbohydrates, increase lean proteins and exercise to promote weight loss, improve glycemic control and prevent progression to Type 2 diabetes.  Increase protein intake. Increase fiber intake.        Relevant Medications    metFORMIN (GLUCOPHAGE) 500 MG tablet   Eating disorder    Eating disorder/emotional eating Brittney Farmer has had issues with stress/emotional eating. Currently this is moderately controlled. Overall mood is stable. Medication(s): Topiramate 50 mg twice daily  No side effects with topiramate.  Working on emotional eating strategies.   Plan: Continue and refill Topiramate 50 mg twice daily.  Continue to work on emotional eating strategies.           Relevant Medications   topiramate (TOPAMAX) 50 MG tablet   BMI 38.0-38.9,adult Current BMI 38.8   She has done well with weight loss.  TBW  loss of 6.8 % since 01/2020 TBW loss of 12.7% from highest weight of 251 lbs since Dec. 2023    TREATMENT PLAN FOR OBESITY:  Recommended Dietary Goals  Rheanna is currently in the action stage of change. As such, her goal is to continue weight management plan. She has agreed to the Category 2 Plan and practicing portion control and making smarter food choices, such as increasing vegetables and decreasing simple carbohydrates.  Behavioral Intervention  We discussed the following Behavioral Modification Strategies today: increasing lean protein intake, decreasing simple carbohydrates , increasing fiber rich foods, increasing water intake, emotional eating strategies and understanding the difference between hunger signals and cravings, and planning for success.  Additional resources provided today: NA  Recommended Physical Activity Goals  Nichele has been advised to work up to 150 minutes of moderate intensity aerobic activity a week and strengthening exercises 2-3 times per week for cardiovascular health, weight loss maintenance and preservation of muscle mass.   She has agreed to Continue current level of physical activity    Pharmacotherapy We discussed various medication options to help Raynisha with her weight loss efforts and we both agreed to continue to work on nutritional and behavioral  strategies to promote weight loss and metformin for insulin resistance and topamax for cravings. .    Return in about 3 weeks (around 08/30/2022).Marland Kitchen She was informed of the importance of frequent follow up visits to maximize her success with intensive lifestyle modifications for her multiple health conditions.   ATTESTASTION STATEMENTS:  Reviewed by clinician on day of visit: allergies, medications, problem list, medical history, surgical history, family history, social history, and previous encounter notes.   I have personally spent 32 minutes total time today in preparation, patient care, nutritional counseling and documentation for this visit, including the following: review of clinical lab tests; review of medical tests/procedures/services.      Alessio Bogan, PA-C

## 2022-08-09 NOTE — Telephone Encounter (Signed)
Patient Advocate Encounter  Submitted a prior authorization for Omeprazole 40mg  capsules via CoverMyMeds, (Key: [id number]). PA has been resolved, no additional PA is required.   Placed a call to [facility] and the pharmacist stated she received a paid claim and will get it ready for the patient.

## 2022-08-09 NOTE — Assessment & Plan Note (Signed)
Eating disorder/emotional eating Brittney Farmer has had issues with stress/emotional eating. Currently this is moderately controlled. Overall mood is stable. Medication(s): Topiramate 50 mg twice daily  No side effects with topiramate.  Working on emotional eating strategies.   Plan: Continue and refill Topiramate 50 mg twice daily.  Continue to work on emotional eating strategies.

## 2022-08-16 ENCOUNTER — Ambulatory Visit (INDEPENDENT_AMBULATORY_CARE_PROVIDER_SITE_OTHER): Payer: 59 | Admitting: Family

## 2022-08-16 ENCOUNTER — Encounter: Payer: Self-pay | Admitting: Family

## 2022-08-16 ENCOUNTER — Other Ambulatory Visit (HOSPITAL_COMMUNITY)
Admission: RE | Admit: 2022-08-16 | Discharge: 2022-08-16 | Disposition: A | Discharge status: Home / Self Care | Payer: 59 | Source: Ambulatory Visit | Attending: Family | Admitting: Family

## 2022-08-16 VITALS — BP 113/73 | HR 100 | Temp 98.1°F | Ht 63.0 in | Wt 220.4 lb

## 2022-08-16 DIAGNOSIS — K219 Gastro-esophageal reflux disease without esophagitis: Secondary | ICD-10-CM

## 2022-08-16 DIAGNOSIS — Z01419 Encounter for gynecological examination (general) (routine) without abnormal findings: Secondary | ICD-10-CM

## 2022-08-16 DIAGNOSIS — Z6841 Body Mass Index (BMI) 40.0 and over, adult: Secondary | ICD-10-CM

## 2022-08-16 DIAGNOSIS — Z01411 Encounter for gynecological examination (general) (routine) with abnormal findings: Secondary | ICD-10-CM

## 2022-08-16 MED ORDER — OMEPRAZOLE 40 MG PO CPDR
40.0000 mg | DELAYED_RELEASE_CAPSULE | Freq: Every day | ORAL | 2 refills | Status: DC
Start: 2022-08-16 — End: 2023-03-01

## 2022-08-16 NOTE — Patient Instructions (Signed)

## 2022-08-16 NOTE — Progress Notes (Signed)
Subjective:    Patient ID: Brittney Farmer, female    DOB: January 07, 1986, 37 y.o.   MRN: [id number]  Chief Complaint  Patient presents with   Annual Exam   Pt presents to the office today for pap.    She was going  healthy weight loss clinic. She is morbid obese with a BMI of 40 with depression, GAD, and hyperlipidemia. She reports she has lost 20 lbs.    She is followed by Cardiologists for  palpitations. Gynecologic Exam The patient's pertinent negatives include no genital itching, genital odor, vaginal bleeding or vaginal discharge. The problem occurs intermittently. The patient is experiencing no pain. She has tried nothing for the symptoms.  Gastroesophageal Reflux She complains of belching and heartburn. She reports no hoarse voice. This is a chronic problem. The current episode started more than 1 year ago. The problem occurs occasionally. She has tried a PPI for the symptoms. The treatment provided moderate relief.      Review of Systems  HENT:  Negative for hoarse voice.   Gastrointestinal:  Positive for heartburn.  Genitourinary:  Negative for vaginal discharge.  All other systems reviewed and are negative.      Objective:   Physical Exam Vitals reviewed.  Constitutional:      General: She is not in acute distress.    Appearance: She is well-developed.  HENT:     Head: Normocephalic and atraumatic.     Right Ear: Tympanic membrane normal.     Left Ear: Tympanic membrane normal.  Eyes:     Pupils: Pupils are equal, round, and reactive to light.  Neck:     Thyroid: No thyromegaly.  Cardiovascular:     Rate and Rhythm: Normal rate and regular rhythm.     Heart sounds: Normal heart sounds. No murmur heard. Pulmonary:     Effort: Pulmonary effort is normal. No respiratory distress.     Breath sounds: Normal breath sounds. No wheezing.  Abdominal:     General: Bowel sounds are normal. There is no distension.     Palpations: Abdomen is soft.     Tenderness: There  is no abdominal tenderness.  Musculoskeletal:        General: No tenderness. Normal range of motion.     Cervical back: Normal range of motion and neck supple.  Skin:    General: Skin is warm and dry.  Neurological:     Mental Status: She is alert and oriented to person, place, and time.     Cranial Nerves: No cranial nerve deficit.     Deep Tendon Reflexes: Reflexes are normal and symmetric.  Psychiatric:        Behavior: Behavior normal.        Thought Content: Thought content normal.        Judgment: Judgment normal.       BP 113/73   Pulse 100   Temp 98.1 F (36.7 C) (Temporal)   Ht  (1.6 m)   Wt 220 lb 6.4 oz (100 kg)   SpO2 (!) 73%   BMI 39.04 kg/m      Assessment & Plan:  Mollee Neer comes in today with chief complaint of No chief complaint on file.   Diagnosis and orders addressed:  1. Gastroesophageal reflux disease without esophagitis - omeprazole (PRILOSEC) 40 MG capsule; Take 1 capsule (40 mg total) by mouth daily.  Dispense: 90 capsule; Refill: 2  2. Encounter for gynecological examination without abnormal finding - Cytology - PAP([facility])  3. Morbid obesity   Labs pending Health Maintenance reviewed Diet and exercise encouraged  Follow up plan: Keep chronic follow up   Jannifer Rodney, FNP

## 2022-08-17 LAB — CYTOLOGY - PAP
Adequacy: ABSENT
Diagnosis: NEGATIVE

## 2022-08-18 ENCOUNTER — Other Ambulatory Visit: Payer: Self-pay | Admitting: Family

## 2022-08-18 MED ORDER — METRONIDAZOLE 500 MG PO TABS: 500.0000 mg | ORAL_TABLET | Freq: Three times a day (TID) | ORAL | 0 refills | Status: AC

## 2022-08-30 ENCOUNTER — Encounter (INDEPENDENT_AMBULATORY_CARE_PROVIDER_SITE_OTHER): Payer: Self-pay | Admitting: Family Medicine

## 2022-08-30 ENCOUNTER — Ambulatory Visit (INDEPENDENT_AMBULATORY_CARE_PROVIDER_SITE_OTHER): Payer: 59 | Admitting: Family Medicine

## 2022-08-30 ENCOUNTER — Encounter: Payer: Self-pay | Admitting: Family

## 2022-08-30 ENCOUNTER — Ambulatory Visit (INDEPENDENT_AMBULATORY_CARE_PROVIDER_SITE_OTHER): Payer: 59 | Admitting: Family

## 2022-08-30 VITALS — BP 116/72 | HR 59 | Temp 97.9°F | Ht 63.0 in | Wt 215.0 lb

## 2022-08-30 VITALS — BP 120/73 | HR 76 | Temp 97.8°F | Ht 63.0 in | Wt 219.0 lb

## 2022-08-30 DIAGNOSIS — E559 Vitamin D deficiency, unspecified: Secondary | ICD-10-CM

## 2022-08-30 DIAGNOSIS — L255 Unspecified contact dermatitis due to plants, except food: Secondary | ICD-10-CM | POA: Diagnosis not present

## 2022-08-30 DIAGNOSIS — E7849 Other hyperlipidemia: Secondary | ICD-10-CM

## 2022-08-30 DIAGNOSIS — F5089 Other specified eating disorder: Secondary | ICD-10-CM

## 2022-08-30 DIAGNOSIS — E88819 Insulin resistance, unspecified: Secondary | ICD-10-CM

## 2022-08-30 DIAGNOSIS — Z6838 Body mass index (BMI) 38.0-38.9, adult: Secondary | ICD-10-CM | POA: Diagnosis not present

## 2022-08-30 MED ORDER — TRIAMCINOLONE ACETONIDE 0.5 % EX OINT
1.0000 | TOPICAL_OINTMENT | Freq: Two times a day (BID) | CUTANEOUS | 0 refills | Status: DC
Start: 2022-08-30 — End: 2022-10-20

## 2022-08-30 MED ORDER — METHYLPREDNISOLONE ACETATE 80 MG/ML IJ SUSP
80.0000 mg | Freq: Once | INTRAMUSCULAR | Status: AC
Start: 2022-08-30 — End: 2022-08-30
  Administered 2022-08-30: 80 mg via INTRAMUSCULAR

## 2022-08-30 MED ORDER — PREDNISONE 10 MG (21) PO TBPK
ORAL_TABLET | ORAL | 0 refills | Status: DC
Start: 2022-08-30 — End: 2022-10-20

## 2022-08-30 MED ORDER — VITAMIN D (ERGOCALCIFEROL) 1.25 MG (50000 UNIT) PO CAPS
50000.0000 [IU] | ORAL_CAPSULE | ORAL | 0 refills | Status: DC
Start: 2022-08-30 — End: 2022-09-20

## 2022-08-30 NOTE — Patient Instructions (Signed)
Poison Ivy Dermatitis Poison ivy dermatitis is irritation and swelling (inflammation) of the skin caused by chemicals in the leaves of the poison ivy plant. The skin reaction often involves redness, blisters, and extreme itching. What are the causes? This condition is caused by a chemical (urushiol) found in the sap of the poison ivy plant. This chemical is sticky and can easily spread to people, animals, and objects. You can get poison ivy dermatitis by: Having direct contact with a poison ivy plant. Touching animals, other people, or objects that have come in contact with poison ivy and have the chemical on them. What increases the risk? This condition is more likely to develop in people who: Are outdoors often in wooded or marshy areas. Go outdoors without wearing protective clothing, such as closed shoes, long pants, and a long-sleeved shirt. What are the signs or symptoms? Symptoms of this condition include: Redness of the skin. Extreme itching. A rash that often includes bumps and blisters. The rash usually appears 48 hours after exposure, if you have been exposed before. If this is the first time you have been exposed, the rash may not appear until a week after exposure. Swelling. This may occur if the reaction is more severe. Symptoms usually last for 1-2 weeks. However, the first time you develop this condition, symptoms may last 3-4 weeks. How is this diagnosed? This condition may be diagnosed based on your symptoms and a physical exam. Your health care provider may also ask you about any recent outdoor activity. How is this treated? Treatment for this condition will vary depending on how severe it is. Treatment may include: Hydrocortisone cream or calamine lotion to relieve itching. Oatmeal baths to soothe the skin. Medicines, such as over-the-counter antihistamine tablets. Oral or injected steroid medicine, for more severe reactions. Follow these instructions at  home: Medicines Take or apply over-the-counter and prescription medicines only as told by your health care provider. Use hydrocortisone cream or calamine lotion as needed to soothe the skin and relieve itching. General instructions Do not scratch or rub your skin. Apply a cold, wet cloth (cold compress) to the affected areas or take baths in cool water. This will help with itching. Avoid hot baths and showers. Take oatmeal baths as needed. Use colloidal oatmeal. You can get this at your local pharmacy or grocery store. Follow the instructions on the packaging. Wash all clothes, bedsheets, towels, and blankets you were in contact with between your exposure and appearance of the rash. Check the affected area every day for signs of infection. Check for: More redness, swelling, or pain. Fluid or blood. Warmth. Pus or a bad smell. Keep all follow-up visits. Your health care provider may want to see how your skin is progressing with treatment. How is this prevented?  Learn to identify the poison ivy plant and avoid contact with the plant. This plant can be recognized by the number of leaves. Generally, poison ivy has three leaves with flowering branches on a single stem. The leaves are typically glossy, and they have jagged edges that come to a point. If you have been exposed to poison ivy, thoroughly wash with soap and water right away. You have about 30 minutes to remove the plant resin before it will cause the rash. Be sure to wash under your fingernails, because any plant resin there will continue to spread the rash. When hiking or camping, wear clothes that will help you to avoid skin exposure. This includes long pants, a long-sleeved shirt, long socks,   and hiking boots. You can also apply preventive lotion to your skin to help limit exposure. If you suspect that your clothes or outdoor gear came in contact with poison ivy, rinse them off outside with a garden hose before you bring them inside  your house. When doing yard work or gardening, wear gloves, long sleeves, long pants, and boots. Wash your garden tools and gloves if they come in contact with poison ivy. If you suspect that your pet has come into contact with poison ivy, wash them with pet shampoo and water. Make sure to wear gloves while washing your pet. Contact a health care provider if: You have open sores in the rash area. You have any signs of infection. You have redness that spreads beyond the rash area. You have a fever. You have a rash over a large area of your body. You have a rash on your eyes, mouth, or genitals. You have a rash that does not improve after a few weeks. Get help right away if: Your face swells or your eyes swell shut. You have trouble breathing. You have trouble swallowing. These symptoms may be an emergency. Get help right away. Call 911. Do not wait to see if the symptoms will go away. Do not drive yourself to the hospital. This information is not intended to replace advice given to you by your health care provider. Make sure you discuss any questions you have with your health care provider. Document Revised: 09/16/2021 Document Reviewed: 09/16/2021 Elsevier Patient Education  2023 [employer] Inc[employer]  

## 2022-08-30 NOTE — Progress Notes (Signed)
   Subjective:    Patient ID: Brittney Farmer, female    DOB: 03-19-1986, 37 y.o.   MRN: [id number]  Chief Complaint  Patient presents with   Poison Ivy   PT presents to the office today with rash on face, right eye lid, left arm, and left thigh. Started yesterday. She was outside Saturday planting a garden and did pull some ivy up.  Rash This is a new problem. The current episode started yesterday. The problem is unchanged. The affected locations include the head, face, left arm, left upper leg and right arm. The rash is characterized by redness, itchiness and blistering. She was exposed to plant contact. Past treatments include anti-itch cream. The treatment provided no relief.      Review of Systems  Skin:  Positive for rash.  All other systems reviewed and are negative.      Objective:   Physical Exam Vitals reviewed.  Constitutional:      General: She is not in acute distress.    Appearance: She is well-developed. She is obese.  HENT:     Head: Normocephalic and atraumatic.  Eyes:     Pupils: Pupils are equal, round, and reactive to light.  Neck:     Thyroid: No thyromegaly.  Cardiovascular:     Rate and Rhythm: Normal rate and regular rhythm.     Heart sounds: Normal heart sounds. No murmur heard. Pulmonary:     Effort: Pulmonary effort is normal. No respiratory distress.     Breath sounds: Normal breath sounds. No wheezing.  Abdominal:     General: Bowel sounds are normal. There is no distension.     Palpations: Abdomen is soft.     Tenderness: There is no abdominal tenderness.  Musculoskeletal:        General: No tenderness. Normal range of motion.     Cervical back: Normal range of motion and neck supple.  Skin:    General: Skin is warm and dry.     Findings: Rash present.       Neurological:     Mental Status: She is alert and oriented to person, place, and time.     Cranial Nerves: No cranial nerve deficit.     Deep Tendon Reflexes: Reflexes are normal  and symmetric.  Psychiatric:        Behavior: Behavior normal.        Thought Content: Thought content normal.        Judgment: Judgment normal.     BP 120/73   Pulse 76   Temp 97.8 F (36.6 C) (Temporal)   Ht 5\' 3"  (1.6 m)   Wt 219 lb (99.3 kg)   SpO2 98%   BMI 38.79 kg/m        Assessment & Plan:  Brittney Farmer comes in today with chief complaint of Poison Ivy   Diagnosis and orders addressed:  1. Contact dermatitis due to plant Avoid scratching  Depo-medrol  Start prednisone tomorrow Kenalog BID, avoid face Follow up if symptoms worsen or do not improve  - methylPREDNISolone acetate (DEPO-MEDROL) injection 80 mg - triamcinolone ointment (KENALOG) 0.5 %; Apply 1 Application topically 2 (two) times daily.  Dispense: 60 g; Refill: 0 - predniSONE (STERAPRED UNI-PAK 21 TAB) 10 MG (21) TBPK tablet; Use as directed  Dispense: 21 tablet; Refill: 0  Jannifer Rodney, FNP

## 2022-08-30 NOTE — Progress Notes (Signed)
Brittney Farmer, D.O.  ABFM, ABOM Specializing in Clinical Bariatric Medicine  Office located at: [address] [place], [place]  [address]     Assessment and Plan:   No orders of the defined types were placed in this encounter.   Medications Discontinued During This Encounter  Medication Reason   Vitamin D, Ergocalciferol, (DRISDOL) 1.25 MG (50000 UNIT) CAPS capsule Reorder     Meds ordered this encounter  Medications   Vitamin D, Ergocalciferol, (DRISDOL) 1.25 MG (50000 UNIT) CAPS capsule    Sig: Take 1 capsule (50,000 Units total) by mouth every 7 (seven) days.    Dispense:  4 capsule    Refill:  0    Check Vitamin D  next OV  Insulin resistance Assessment: Condition is improving, but not optimized. Lab Results  Component Value Date   HGBA1C 5.3 05/10/2022   HGBA1C 5.2 11/11/2021   HGBA1C 5.1 02/20/2020   INSULIN 10.0 05/10/2022   INSULIN 12.5 11/11/2021   INSULIN 5.2 02/20/2020  - She reports that she is not taking her Metformin 500 mg BID regularly. She endorses she sometimes forgets to take it.  - She endorses that her hunger and cravings are controlled when taking the medication and eating according to the meal plan.   Plan:  - I recommended patient to put her Metformin in her lunchbox and to take it alongside her food.  - Patient agreed to take med more regularly.    - Continue to decrease simple carbs/ sugars; increase fiber and proteins -> follow her meal plan.   - Brittney Farmer will continue to work on weight loss, exercise, via their meal plan we devised to help decrease the risk of progressing to diabetes.     Vitamin D deficiency Assessment: Condition is not optimized.   Lab Results  Component Value Date   VD25OH 27.7 (L) 05/10/2022   VD25OH 41.2 11/11/2021   VD25OH 40.6 05/12/2021  - No issues with Ergocalciferol 50K IU weekly, denies any side effects.  Plan: -  Continue with med. Will refill this today.   -  Continue her prudent  nutritional plan that is low in simple carbohydrates, saturated fats and trans fats to goal of 5-10% weight loss to achieve significant health benefits.  Pt encouraged to continually advance exercise and cardiovascular fitness as tolerated throughout weight loss journey.    Other disorder of eating-emotional eating Assessment: Condition is stable. - Denies any SI/HI. Mood is stable. - Cravings and hunger are well controlled on Topamax and when eating on plan.  - No issues with Topamax 50 mg BID. Endorses feeling tingling in her fingers in the morning,which she thinks is likely due to the Topamax. However, she reports that the sensation goes away and is not bothersome. No concerns.   Plan: - Continue with med. Patient denies need for refill.  - Continue her prudent nutritional plan that is low in simple carbohydrates, saturated fats and trans fats to goal of 5-10% weight loss to achieve significant health benefits.   TREATMENT PLAN FOR OBESITY: BMI 38.0-38.9,adult Current BMI 38.09 Morbid obesity (HCC) Assessment: Condition is improving, but not optimized. Biometric data collected today, was reviewed with patient.  Fat mass has decreased by 2lb. Muscle mass has decreased by 1.2 lb. Total body water has decreased by 0.2 lb.   Plan:  Brittney Farmer is currently in the action stage of change. As such, her goal is to continue weight management plan. Brittney Farmer will work on healthier eating habits  and continue with the Category 2 meal plan with breakfast and lunch options.  Counseling was done on: - Ways that patient can meal prep simple recipes for dinner meals.  - I encouraged patient to check the Weight Watchers website to find more flavorful meals.   Behavioral Intervention Additional resources provided today: patient declined Evidence-based interventions for health behavior change were utilized today including the discussion of self monitoring techniques, problem-solving barriers and SMART goal  setting techniques.   Regarding patient's less desirable eating habits and patterns, we employed the technique of small changes.  Pt will specifically work on: consistently taking Metformin, continue prudent nutritional plan, and not weighing herself at home for next visit.    Recommended Physical Activity Goals Brittney Farmer has been advised to work up to 150 minutes of moderate intensity aerobic activity a week and strengthening exercises 2-3 times per week for cardiovascular health, weight loss maintenance and preservation of muscle mass.  She has agreed to Continue current level of physical activity   FOLLOW UP: Return in about 3 weeks (around 09/20/2022). She was informed of the importance of frequent follow up visits to maximize her success with intensive lifestyle modifications for her multiple health conditions.   Subjective:   Chief complaint: Obesity Brittney Farmer is here to discuss her progress with her obesity treatment plan. She is on the the Category 2 Plan with breakfast and lunch options and states she is following her eating plan approximately 75% of the time. She states she is walking and doing to the gym 60 minutes 4-5 days per week.  Interval History:  Brittney Farmer is here for a follow up office visit.  Since last office visit she: - Has been checking her weight at home and reports feeling frustrated because the weight is fluctuating.  - Patient endorses that she is bored with eating some of the foods on the plan, such as the salad and tuna packets.  - Patient endorses not having a lot time to cook.   Pharmacotherapy for weight loss: She is currently taking Topamax and Metformin  for medical weight loss.  Denies side effects.    Review of Systems:  Pertinent positives were addressed with patient today.   Weight Summary and Biometrics   Weight Lost Since Last Visit: 4 lb  Weight Gained Since Last Visit: 0    Vitals Temp: 97.9 F (36.6 C) BP: 116/72 Pulse Rate: (!)  59 SpO2: 99 %   Anthropometric Measurements Height: 5\' 3"  (1.6 m) Weight: 215 lb (97.5 kg) BMI (Calculated): 38.09 Weight at Last Visit: 219 lb Weight Lost Since Last Visit: 4 lb Weight Gained Since Last Visit: 0 Starting Weight: 235 lb Total Weight Loss (lbs): 20 lb (9.072 kg) Peak Weight: 256 lb   Body Composition  Body Fat %: 44.3 % Fat Mass (lbs): 95.6 lbs Muscle Mass (lbs): 114 lbs Total Body Water (lbs): 83.8 lbs Visceral Fat Rating : 11   Other Clinical Data Fasting: No Labs: No Today's Visit #: 29 Starting Date: 02/20/20     Objective:   PHYSICAL EXAM: Blood pressure 116/72, pulse (!) 59, temperature 97.9 F (36.6 C), height 5\' 3"  (1.6 m), weight 215 lb (97.5 kg), SpO2 99 %. Body mass index is 38.09 kg/m.  General: Well Developed, well nourished, and in no acute distress.  HEENT: Normocephalic, atraumatic Skin: Warm and dry, cap RF less 2 sec, good turgor Chest:  Normal excursion, shape, no gross abn Respiratory: speaking in full sentences, no conversational dyspnea NeuroM-Sk: Ambulates  w/o assistance, moves * 4 Psych: A and O *3, insight good, mood-full  DIAGNOSTIC DATA REVIEWED:  BMET    Component Value Date/Time   NA 139 07/12/2022 0912   K 4.8 07/12/2022 0912   CL 105 07/12/2022 0912   CO2 20 07/12/2022 0912   GLUCOSE 89 07/12/2022 0912   GLUCOSE 75 10/04/2012 1238   BUN 8 07/12/2022 0912   CREATININE 0.77 07/12/2022 0912   CREATININE 0.59 10/04/2012 1238   CALCIUM 9.3 07/12/2022 0912   GFRNONAA 111 02/20/2020 1012   GFRNONAA >89 10/04/2012 1238   GFRAA 128 02/20/2020 1012   GFRAA >89 10/04/2012 1238   Lab Results  Component Value Date   HGBA1C 5.3 05/10/2022   HGBA1C 5.1 02/20/2020   Lab Results  Component Value Date   INSULIN 10.0 05/10/2022   INSULIN 5.2 02/20/2020   Lab Results  Component Value Date   TSH 1.220 07/12/2022   CBC    Component Value Date/Time   WBC 6.8 07/12/2022 0912   WBC 7.8 10/18/2021 0820   RBC  4.70 07/12/2022 0912   RBC 4.52 10/18/2021 0820   HGB 13.3 07/12/2022 0912   HCT 41.5 07/12/2022 0912   PLT 320 07/12/2022 0912   MCV 88 07/12/2022 0912   MCH 28.3 07/12/2022 0912   MCH 29.2 10/18/2021 0820   MCHC 32.0 07/12/2022 0912   MCHC 32.7 10/18/2021 0820   RDW 13.7 07/12/2022 0912   Iron Studies    Component Value Date/Time   IRON 38 02/20/2020 1012   TIBC 284 02/20/2020 1012   FERRITIN 103 02/20/2020 1012   IRONPCTSAT 13 (L) 02/20/2020 1012   Lipid Panel     Component Value Date/Time   CHOL 162 07/12/2022 0912   TRIG 66 07/12/2022 0912   HDL 48 07/12/2022 0912   CHOLHDL 3.4 07/12/2022 0912   LDLCALC 101 (H) 07/12/2022 0912   Hepatic Function Panel     Component Value Date/Time   PROT 6.3 07/12/2022 0912   ALBUMIN 4.2 07/12/2022 0912   AST 13 07/12/2022 0912   ALT 10 07/12/2022 0912   ALKPHOS 87 07/12/2022 0912   BILITOT 0.3 07/12/2022 0912   BILIDIR 0.1 10/04/2012 1238   IBILI 0.2 10/04/2012 1238      Component Value Date/Time   TSH 1.220 07/12/2022 0912   Nutritional Lab Results  Component Value Date   VD25OH 27.7 (L) 05/10/2022   VD25OH 41.2 11/11/2021   VD25OH 40.6 05/12/2021    Attestations:   Reviewed by clinician on day of visit: allergies, medications, problem list, medical history, surgical history, family history, social history, and previous encounter notes.  I,Special Puri,acting as a Neurosurgeon for [employer], DO.,have documented all relevant documentation on the behalf of Thomasene Lot, DO,as directed by  Thomasene Lot, DO while in the presence of Thomasene Lot, DO.   I, Thomasene Lot, DO, have reviewed all documentation for this visit. The documentation on 08/30/22 for the exam, diagnosis, procedures, and orders are all accurate and complete.

## 2022-09-12 ENCOUNTER — Other Ambulatory Visit (INDEPENDENT_AMBULATORY_CARE_PROVIDER_SITE_OTHER): Payer: Self-pay | Admitting: Physician Assistant

## 2022-09-12 DIAGNOSIS — E88819 Insulin resistance, unspecified: Secondary | ICD-10-CM

## 2022-09-20 ENCOUNTER — Ambulatory Visit (INDEPENDENT_AMBULATORY_CARE_PROVIDER_SITE_OTHER): Payer: 59 | Admitting: Physician Assistant

## 2022-09-20 ENCOUNTER — Encounter (INDEPENDENT_AMBULATORY_CARE_PROVIDER_SITE_OTHER): Payer: Self-pay | Admitting: Physician Assistant

## 2022-09-20 VITALS — BP 110/75 | HR 66 | Temp 97.9°F | Ht 63.0 in | Wt 213.0 lb

## 2022-09-20 DIAGNOSIS — E669 Obesity, unspecified: Secondary | ICD-10-CM

## 2022-09-20 DIAGNOSIS — F5089 Other specified eating disorder: Secondary | ICD-10-CM

## 2022-09-20 DIAGNOSIS — E88819 Insulin resistance, unspecified: Secondary | ICD-10-CM

## 2022-09-20 DIAGNOSIS — Z6837 Body mass index (BMI) 37.0-37.9, adult: Secondary | ICD-10-CM | POA: Diagnosis not present

## 2022-09-20 DIAGNOSIS — E7849 Other hyperlipidemia: Secondary | ICD-10-CM

## 2022-09-20 DIAGNOSIS — E559 Vitamin D deficiency, unspecified: Secondary | ICD-10-CM | POA: Diagnosis not present

## 2022-09-20 MED ORDER — TOPIRAMATE 50 MG PO TABS
50.0000 mg | ORAL_TABLET | Freq: Two times a day (BID) | ORAL | 0 refills | Status: DC
Start: 2022-09-20 — End: 2022-11-08

## 2022-09-20 MED ORDER — METFORMIN HCL 500 MG PO TABS: ORAL_TABLET | ORAL | 0 refills | Status: DC

## 2022-09-20 MED ORDER — VITAMIN D (ERGOCALCIFEROL) 1.25 MG (50000 UNIT) PO CAPS
50000.0000 [IU] | ORAL_CAPSULE | ORAL | 0 refills | Status: DC
Start: 2022-09-20 — End: 2022-11-08

## 2022-09-20 NOTE — Progress Notes (Signed)
.[facility]  Office: [phone]  /  Fax: [phone]  WEIGHT SUMMARY AND BIOMETRICS  Vitals Temp: 97.9 F (36.6 C) BP: 110/75 Pulse Rate: 66 SpO2: 99 %   Anthropometric Measurements Height: 5\' 3"  (1.6 m) Weight: 213 lb (96.6 kg) BMI (Calculated): 37.74 Weight at Last Visit: 215 lb Weight Lost Since Last Visit: 2 lb Weight Gained Since Last Visit: 0 lb Starting Weight: 235 lb   Body Composition  Body Fat %: 41.1 % Fat Mass (lbs): 87.6 lbs Muscle Mass (lbs): 119.2 lbs Total Body Water (lbs): 82.2 lbs Visceral Fat Rating : 10   Other Clinical Data Fasting: No Labs: No Today's Visit #: 30 Starting Date: 02/20/20     HPI  Chief Complaint: OBESITY  Brittney Farmer is here to discuss her progress with her obesity treatment plan. She is on the the Category 2 Plan and states she is following her eating plan approximately 70 % of the time. She states she is exercising/gym/walking 60 minutes 5 times per week.   Interval History:  Since last office visit she down 2 lbs/ Down 22 lbs overall.  Bio impedence scale: Up 5.2 lbs muscle mass !!, Down 8 lbs adipose mass !! Hunger/appetite-moderate, not excessive. Very good adherence to nutrition plan overall. Does not feel she is always meeting protein goal- 100 calorie protein snack hand out provided.  Cravings- not excessive Stress- manageable- very busy with work and 4 kids.  Exercise-Gym daily for 1 hr with niece and walking daily  Beach trip June 8th.    Pharmacotherapy: metformin 750 mg twice daily. No GI side effects.    Topamax 50 mg twice daily for emotional eating/cravings. No side effects with    Topamax.   TREATMENT PLAN FOR OBESITY:  Recommended Dietary Goals  Brittney Farmer is currently in the action stage of change. As such, her goal is to continue weight management plan. She has agreed to the Category 2 Plan.  Behavioral Intervention  We discussed the following Behavioral Modification Strategies today: increasing lean  protein intake, decreasing simple carbohydrates , increasing vegetables, increasing lower glycemic fruits, increasing fiber rich foods, increasing water intake, continue to practice mindfulness when eating, and planning for success.  Additional resources provided today: 100 calorie protein snack hand out  Recommended Physical Activity Goals  Brittney Farmer has been advised to work up to 150 minutes of moderate intensity aerobic activity a week and strengthening exercises 2-3 times per week for cardiovascular health, weight loss maintenance and preservation of muscle mass.   She has agreed to Continue current level of physical activity    Pharmacotherapy We discussed various medication options to help Brittney Farmer with her weight loss efforts and we both agreed to continue metformin for prediabetes and topamax for cravings/emotional eating.    Return in about 4 weeks (around 10/18/2022).Marland Kitchen She was informed of the importance of frequent follow up visits to maximize her success with intensive lifestyle modifications for her multiple health conditions.  PHYSICAL EXAM:  Blood pressure 110/75, pulse 66, temperature 97.9 F (36.6 C), height 5\' 3"  (1.6 m), weight 213 lb (96.6 kg), SpO2 99 %. Body mass index is 37.73 kg/m.  General: She is overweight, cooperative, alert, well developed, and in no acute distress. PSYCH: Has normal mood, affect and thought process.   Cardiovascular: HR 60's regular Lungs: Normal breathing effort, no conversational dyspnea. Neuro: no focal deficits.   DIAGNOSTIC DATA REVIEWED:  BMET    Component Value Date/Time   NA 139 07/12/2022 0912   K 4.8 07/12/2022 0912  CL 105 07/12/2022 0912   CO2 20 07/12/2022 0912   GLUCOSE 89 07/12/2022 0912   GLUCOSE 75 10/04/2012 1238   BUN 8 07/12/2022 0912   CREATININE 0.77 07/12/2022 0912   CREATININE 0.59 10/04/2012 1238   CALCIUM 9.3 07/12/2022 0912   GFRNONAA 111 02/20/2020 1012   GFRNONAA >89 10/04/2012 1238   GFRAA 128  02/20/2020 1012   GFRAA >89 10/04/2012 1238   Lab Results  Component Value Date   HGBA1C 5.3 05/10/2022   HGBA1C 5.1 02/20/2020   Lab Results  Component Value Date   INSULIN 10.0 05/10/2022   INSULIN 5.2 02/20/2020   Lab Results  Component Value Date   TSH 1.220 07/12/2022   CBC    Component Value Date/Time   WBC 6.8 07/12/2022 0912   WBC 7.8 10/18/2021 0820   RBC 4.70 07/12/2022 0912   RBC 4.52 10/18/2021 0820   HGB 13.3 07/12/2022 0912   HCT 41.5 07/12/2022 0912   PLT 320 07/12/2022 0912   MCV 88 07/12/2022 0912   MCH 28.3 07/12/2022 0912   MCH 29.2 10/18/2021 0820   MCHC 32.0 07/12/2022 0912   MCHC 32.7 10/18/2021 0820   RDW 13.7 07/12/2022 0912   Iron Studies    Component Value Date/Time   IRON 38 02/20/2020 1012   TIBC 284 02/20/2020 1012   FERRITIN 103 02/20/2020 1012   IRONPCTSAT 13 (L) 02/20/2020 1012   Lipid Panel     Component Value Date/Time   CHOL 162 07/12/2022 0912   TRIG 66 07/12/2022 0912   HDL 48 07/12/2022 0912   CHOLHDL 3.4 07/12/2022 0912   LDLCALC 101 (H) 07/12/2022 0912   Hepatic Function Panel     Component Value Date/Time   PROT 6.3 07/12/2022 0912   ALBUMIN 4.2 07/12/2022 0912   AST 13 07/12/2022 0912   ALT 10 07/12/2022 0912   ALKPHOS 87 07/12/2022 0912   BILITOT 0.3 07/12/2022 0912   BILIDIR 0.1 10/04/2012 1238   IBILI 0.2 10/04/2012 1238      Component Value Date/Time   TSH 1.220 07/12/2022 0912   Nutritional Lab Results  Component Value Date   VD25OH 27.7 (L) 05/10/2022   VD25OH 41.2 11/11/2021   VD25OH 40.6 05/12/2021    ASSOCIATED CONDITIONS ADDRESSED TODAY  ASSESSMENT AND PLAN  Problem List Items Addressed This Visit     Obesity Start BMI 41.63 Date 02/20/20   Relevant Medications   metFORMIN (GLUCOPHAGE) 500 MG tablet   Vitamin D deficiency   Relevant Medications   Vitamin D, Ergocalciferol, (DRISDOL) 1.25 MG (50000 UNIT) CAPS capsule   Insulin resistance - Primary   Relevant Medications    metFORMIN (GLUCOPHAGE) 500 MG tablet   Eating disorder   Relevant Medications   topiramate (TOPAMAX) 50 MG tablet   Other Visit Diagnoses     BMI 37.0-37.9, adult Current BMI 37.8         Insulin Resistance Last fasting insulin was 10.0. A1c was 5.3. Polyphagia:No Medication(s): Metformin 750 mg twice daily and topamax 50 mg twice daily. No side effects with metformin or topamax.  She is working on Engineer, technical sales to decrease simple carbohydrates, increase lean proteins and exercise to promote weight loss, improve glycemic control and prevent progression to Type 2 diabetes.   Lab Results  Component Value Date   HGBA1C 5.3 05/10/2022   HGBA1C 5.2 11/11/2021   HGBA1C 5.1 02/20/2020   Lab Results  Component Value Date   INSULIN 10.0 05/10/2022   INSULIN 12.5 11/11/2021  INSULIN 5.2 02/20/2020    Plan: Continue and refill  metformin 750 mg twice daily and topamax 50 mg twice daily.  Continue working on nutrition plan to decrease simple carbohydrates, increase lean proteins and exercise to promote weight loss, improve glycemic control and prevent progression to Type 2 diabetes.  Plan to recheck labs in next 1-2 months.    Vitamin D Deficiency Vitamin D is not at goal of 50.  Most recent vitamin D level was 27.7. She is on  prescription ergocalciferol 50,000 IU weekly. Lab Results  Component Value Date   VD25OH 27.7 (L) 05/10/2022   VD25OH 41.2 11/11/2021   VD25OH 40.6 05/12/2021    Plan: Continue and refill  prescription ergocalciferol 50,000 IU weekly Low vitamin D levels can be associated with adiposity and may result in leptin resistance and weight gain. Also associated with fatigue. Currently on vitamin D supplementation without any adverse effects.  Plan to recheck labs next 1-2 months.   Eating disorder/emotional eating Cornie has had issues with stress/emotional eating. Currently this is moderately controlled. Overall mood is stable. Medication(s): Topiramate  50 mg twice daily. No side effects with topiramate  Plan: Continue and refill Topiramate 50 mg twice daily.  Continue to work on emotional eating strategies.   ATTESTASTION STATEMENTS:  Reviewed by clinician on day of visit: allergies, medications, problem list, medical history, surgical history, family history, social history, and previous encounter notes.   I have personally spent 40 minutes total time today in preparation, patient care, nutritional counseling and documentation for this visit, including the following: review of clinical lab tests; review of medical tests/procedures/services.      Brittney Peifer, PA-C

## 2022-10-11 ENCOUNTER — Ambulatory Visit (INDEPENDENT_AMBULATORY_CARE_PROVIDER_SITE_OTHER): Payer: 59 | Admitting: Family Medicine

## 2022-10-18 ENCOUNTER — Ambulatory Visit (INDEPENDENT_AMBULATORY_CARE_PROVIDER_SITE_OTHER): Payer: 59 | Admitting: Family Medicine

## 2022-10-20 ENCOUNTER — Other Ambulatory Visit (INDEPENDENT_AMBULATORY_CARE_PROVIDER_SITE_OTHER): Payer: 59

## 2022-10-20 ENCOUNTER — Encounter: Payer: Self-pay | Admitting: Orthopaedic Surgery

## 2022-10-20 ENCOUNTER — Ambulatory Visit (INDEPENDENT_AMBULATORY_CARE_PROVIDER_SITE_OTHER): Payer: 59 | Admitting: Orthopaedic Surgery

## 2022-10-20 VITALS — Ht 63.0 in | Wt 213.0 lb

## 2022-10-20 DIAGNOSIS — G8929 Other chronic pain: Secondary | ICD-10-CM | POA: Diagnosis not present

## 2022-10-20 DIAGNOSIS — M545 Low back pain, unspecified: Secondary | ICD-10-CM

## 2022-10-20 DIAGNOSIS — M542 Cervicalgia: Secondary | ICD-10-CM

## 2022-10-20 NOTE — Progress Notes (Signed)
Office Visit Note   Patient: Brittney Farmer           Date of Birth: 07-09-1985           MRN: [id number] Visit Date: 10/20/2022              Requested by: Junie Spencer, FNP [address] [place],  [place] [address] PCP: Junie Spencer, FNP   Assessment & Plan: Visit Diagnoses:  1. Neck pain   2. Chronic bilateral low back pain, unspecified whether sciatica present     Plan: Will set patient up for some physical therapy for treatment of lumbar pain evaluation and treatment.  Office follow-up 5 to 6 weeks.  Follow-Up Instructions: No follow-ups on file.   Orders:  Orders Placed This Encounter  Procedures   XR Cervical Spine 2 or 3 views   XR Lumbar Spine 2-3 Views   No orders of the defined types were placed in this encounter.     Procedures: No procedures performed   Clinical Data: No additional findings.   Subjective: Chief Complaint  Patient presents with   Neck - Pain   Lower Back - Pain    HPI 37 year old female with chronic back pain here with her 35 year old son she states she has had back pain since she had her epidural at the time of his birth.  She has pain that radiates into both thighs right equal to left sometimes numbness and tingling.  She sits down on the toilet for a few minutes she states her legs go numb.  She has had some discomfort in her neck some pain that radiates into her shoulder blade sometimes numbness and tingling in her fingers and some posterior headaches.  She has used ibuprofen with some improvement also Tylenol.  She had some prednisone by her PCP which gave her some relief.  She had therapy many years ago and states that made the sciatica worse.  No associated bowel or bladder symptoms.  Does have a history of IBS.  History of morbid obesity BMI previously 41 now 37.  Review of Systems all systems noncontributory to HPI.   Objective: Vital Signs: Ht 5\' 3"  (1.6 m)   Wt 213 lb (96.6 kg)   BMI 37.73 kg/m   Physical  Exam Constitutional:      Appearance: She is well-developed.  HENT:     Head: Normocephalic.     Right Ear: External ear normal.     Left Ear: External ear normal. There is no impacted cerumen.  Eyes:     Pupils: Pupils are equal, round, and reactive to light.  Neck:     Thyroid: No thyromegaly.     Trachea: No tracheal deviation.  Cardiovascular:     Rate and Rhythm: Normal rate.  Pulmonary:     Effort: Pulmonary effort is normal.  Abdominal:     Palpations: Abdomen is soft.  Musculoskeletal:     Cervical back: No rigidity.  Skin:    General: Skin is warm and dry.  Neurological:     Mental Status: She is alert and oriented to person, place, and time.  Psychiatric:        Behavior: Behavior normal.     Ortho Exam patient has intact lower extremity reflexes she is able to heel and toe walk tenderness over the lumbar spine no sciatic notch tenderness some trochanteric bursal tenderness both right and left.  Good abduction strength.  Knee and ankle jerk are intact pulses are  normal.  Specialty Comments:  No specialty comments available.  Imaging: No results found.   PMFS History: Patient Active Problem List   Diagnosis Date Noted   BMI 38.0-38.9,adult Current BMI 38.8 08/09/2022   BMI 40.0-44.9, adult (HCC)-current bmi 41.1 06/23/2022   Eating disorder 05/03/2022   Class 3 severe obesity with serious comorbidity and body mass index (BMI) of 40.0 to 44.9 in adult (HCC) 05/03/2022   Precordial chest pain 01/25/2022   SVT (supraventricular tachycardia) 01/24/2022   B12 deficiency 11/11/2021   Insulin resistance 11/11/2021   Umbilical hernia without obstruction and without gangrene    Nausea with vomiting    Rectal bleeding    At risk for activity intolerance 05/07/2020   Other hyperlipidemia 03/05/2020   Vitamin D deficiency 03/05/2020   Depression 03/05/2020   Other fatigue 02/20/2020   SOB (shortness of breath) on exertion 02/20/2020   Other irritable bowel  syndrome 02/20/2020   Absolute anemia 02/20/2020   Mood disorder with emotional eating 02/20/2020   At risk for impaired metabolic function 02/20/2020   Allergic rhinitis 10/31/2019   Gastroesophageal reflux disease without esophagitis 08/08/2019   Migraine 07/12/2019   Obesity Start BMI 41.63 Date 02/20/20 12/22/2014   GAD (generalized anxiety disorder) 06/06/2014   IBS (irritable bowel syndrome) 06/06/2014   Past Medical History:  Diagnosis Date   ADD (attention deficit disorder)    ADHD    Anemia    Anxiety    Back pain    Constipation    Depression    GERD (gastroesophageal reflux disease)    IBS (irritable bowel syndrome)    IBS (irritable bowel syndrome)    Palpitations    SOB (shortness of breath) on exertion    Spine curvature, acquired    Stomach ulcer     Family History  Problem Relation Age of Onset   Diabetes Mother    Hyperlipidemia Mother    Hypertension Mother    Heart disease Mother    Depression Mother    Anxiety disorder Mother    Alcoholism Mother    Hyperlipidemia Father    Hypertension Father    Alcoholism Father    Celiac disease Neg Hx    Inflammatory bowel disease Neg Hx    Colon cancer Neg Hx     Past Surgical History:  Procedure Laterality Date   BIOPSY  07/27/2020   Procedure: BIOPSY;  Surgeon: Lanelle Bal, DO;  Location: [facility];  Service: Endoscopy;;   COLONOSCOPY WITH PROPOFOL N/A 07/27/2020   Procedure: COLONOSCOPY WITH PROPOFOL;  Surgeon: Lanelle Bal, DO;  Location: [facility];  Service: Endoscopy;  Laterality: N/A;  PM   ESOPHAGOGASTRODUODENOSCOPY (EGD) WITH PROPOFOL N/A 07/27/2020   Procedure: ESOPHAGOGASTRODUODENOSCOPY (EGD) WITH PROPOFOL;  Surgeon: Lanelle Bal, DO;  Location: [facility];  Service: Endoscopy;  Laterality: N/A;   HAND SURGERY Left 09/08/2016   [facility]   TUBAL LIGATION  03/13/2019   UMBILICAL HERNIA REPAIR N/A 10/20/2021   Procedure: HERNIA REPAIR UMBILICAL ADULT W/ MESH;  Surgeon:  Lewie Chamber, DO;  Location: [facility];  Service: General;  Laterality: N/A;   VAGINAL DELIVERY  03/13/2019   WISDOM TOOTH EXTRACTION     Social History   Occupational History   Occupation: Diplomatic Services operational officer  Tobacco Use   Smoking status: Never   Smokeless tobacco: Never  Vaping Use   Vaping Use: Never used  Substance and Sexual Activity   Alcohol use: No   Drug use: No  Sexual activity: Yes

## 2022-11-01 ENCOUNTER — Ambulatory Visit: Payer: 59

## 2022-11-01 NOTE — Therapy (Deleted)
OUTPATIENT PHYSICAL THERAPY THORACOLUMBAR EVALUATION   Patient Name: Brittney Farmer MRN: [id number] DOB:27-Jul-1985, 37 y.o., female Today's Date: 11/01/2022  END OF SESSION:   Past Medical History:  Diagnosis Date   ADD (attention deficit disorder)    ADHD    Anemia    Anxiety    Back pain    Constipation    Depression    GERD (gastroesophageal reflux disease)    IBS (irritable bowel syndrome)    IBS (irritable bowel syndrome)    Palpitations    SOB (shortness of breath) on exertion    Spine curvature, acquired    Stomach ulcer    Past Surgical History:  Procedure Laterality Date   BIOPSY  07/27/2020   Procedure: BIOPSY;  Surgeon: Lanelle Bal, DO;  Location: [facility];  Service: Endoscopy;;   COLONOSCOPY WITH PROPOFOL N/A 07/27/2020   Procedure: COLONOSCOPY WITH PROPOFOL;  Surgeon: Lanelle Bal, DO;  Location: [facility];  Service: Endoscopy;  Laterality: N/A;  PM   ESOPHAGOGASTRODUODENOSCOPY (EGD) WITH PROPOFOL N/A 07/27/2020   Procedure: ESOPHAGOGASTRODUODENOSCOPY (EGD) WITH PROPOFOL;  Surgeon: Lanelle Bal, DO;  Location: [facility];  Service: Endoscopy;  Laterality: N/A;   HAND SURGERY Left 09/08/2016   [place]   TUBAL LIGATION  03/13/2019   UMBILICAL HERNIA REPAIR N/A 10/20/2021   Procedure: HERNIA REPAIR UMBILICAL ADULT W/ MESH;  Surgeon: Lewie Chamber, DO;  Location: [facility];  Service: General;  Laterality: N/A;   VAGINAL DELIVERY  03/13/2019   WISDOM TOOTH EXTRACTION     Patient Active Problem List   Diagnosis Date Noted   BMI 38.0-38.9,adult Current BMI 38.8 08/09/2022   BMI 40.0-44.9, adult (HCC)-current bmi 41.1 06/23/2022   Eating disorder 05/03/2022   Class 3 severe obesity with serious comorbidity and body mass index (BMI) of 40.0 to 44.9 in adult (HCC) 05/03/2022   Precordial chest pain 01/25/2022   SVT (supraventricular tachycardia) 01/24/2022   B12 deficiency 11/11/2021   Insulin resistance 11/11/2021   Umbilical  hernia without obstruction and without gangrene    Nausea with vomiting    Rectal bleeding    At risk for activity intolerance 05/07/2020   Other hyperlipidemia 03/05/2020   Vitamin D deficiency 03/05/2020   Depression 03/05/2020   Other fatigue 02/20/2020   SOB (shortness of breath) on exertion 02/20/2020   Other irritable bowel syndrome 02/20/2020   Absolute anemia 02/20/2020   Mood disorder with emotional eating 02/20/2020   At risk for impaired metabolic function 02/20/2020   Allergic rhinitis 10/31/2019   Gastroesophageal reflux disease without esophagitis 08/08/2019   Migraine 07/12/2019   Obesity Start BMI 41.63 Date 02/20/20 12/22/2014   GAD (generalized anxiety disorder) 06/06/2014   IBS (irritable bowel syndrome) 06/06/2014    PCP: ***  REFERRING PROVIDER: Eldred Manges, MD   REFERRING DIAG: Chronic bilateral low back pain, unspecified whether sciatica present   Rationale for Evaluation and Treatment: Rehabilitation  THERAPY DIAG:  No diagnosis found.  ONSET DATE: ***  SUBJECTIVE:  SUBJECTIVE STATEMENT: ***  PERTINENT HISTORY:  ***  PAIN:  Are you having pain? {OPRCPAIN:27236}  PRECAUTIONS: {Therapy precautions:24002}  WEIGHT BEARING RESTRICTIONS: {Yes ***/No:24003}  FALLS:  Has patient fallen in last 6 months? {fallsyesno:27318}  LIVING ENVIRONMENT: Lives with: {OPRC lives with:25569::"lives with their family"} Lives in: {Lives in:25570} Stairs: {opstairs:27293} Has following equipment at home: {Assistive devices:23999}  OCCUPATION: ***  PLOF: {PLOF:24004}  PATIENT GOALS: ***  NEXT MD VISIT: ***  OBJECTIVE:   DIAGNOSTIC FINDINGS:  ***  PATIENT SURVEYS:  {rehab surveys:24030}  SCREENING FOR RED FLAGS: Bowel or bladder incontinence:  {Yes/No:304960894} Spinal tumors: {Yes/No:304960894} Cauda equina syndrome: {Yes/No:304960894} Compression fracture: {Yes/No:304960894} Abdominal aneurysm: {Yes/No:304960894}  COGNITION: Overall cognitive status: {cognition:24006}     SENSATION: {sensation:27233}  MUSCLE LENGTH: Hamstrings: Right *** deg; Left *** deg Thomas test: Right *** deg; Left *** deg  POSTURE: {posture:25561}  PALPATION: ***  LUMBAR ROM:   AROM eval  Flexion   Extension   Right lateral flexion   Left lateral flexion   Right rotation   Left rotation    (Blank rows = not tested)  LOWER EXTREMITY ROM:     {AROM/PROM:27142}  Right eval Left eval  Hip flexion    Hip extension    Hip abduction    Hip adduction    Hip internal rotation    Hip external rotation    Knee flexion    Knee extension    Ankle dorsiflexion    Ankle plantarflexion    Ankle inversion    Ankle eversion     (Blank rows = not tested)  LOWER EXTREMITY MMT:    MMT Right eval Left eval  Hip flexion    Hip extension    Hip abduction    Hip adduction    Hip internal rotation    Hip external rotation    Knee flexion    Knee extension    Ankle dorsiflexion    Ankle plantarflexion    Ankle inversion    Ankle eversion     (Blank rows = not tested)  LUMBAR SPECIAL TESTS:  {lumbar special test:25242}  FUNCTIONAL TESTS:  {Functional tests:24029}  GAIT: Distance walked: *** Assistive device utilized: {Assistive devices:23999} Level of assistance: {Levels of assistance:24026} Comments: ***  TODAY'S TREATMENT:                                                                                                                              DATE: ***    PATIENT EDUCATION:  Education details: *** Person educated: {Person educated:25204} Education method: {Education Method:25205} Education comprehension: {Education Comprehension:25206}  HOME EXERCISE PROGRAM: ***  ASSESSMENT:  CLINICAL IMPRESSION: Patient  is a 37 y.o. female who was seen today for physical therapy evaluation and treatment for ***.   OBJECTIVE IMPAIRMENTS: {opptimpairments:25111}.   ACTIVITY LIMITATIONS: {activitylimitations:27494}  PARTICIPATION LIMITATIONS: {participationrestrictions:25113}  PERSONAL FACTORS: {Personal factors:25162} are also affecting patient's functional outcome.   REHAB POTENTIAL: {rehabpotential:25112}  CLINICAL DECISION MAKING: {clinical decision  making:25114}  EVALUATION COMPLEXITY: {Evaluation complexity:25115}   GOALS: Goals reviewed with patient? {yes/no:20286}  SHORT TERM GOALS: Target date: ***  *** Baseline: Goal status: INITIAL  2.  *** Baseline:  Goal status: INITIAL  3.  *** Baseline:  Goal status: INITIAL  4.  *** Baseline:  Goal status: INITIAL  5.  *** Baseline:  Goal status: INITIAL  6.  *** Baseline:  Goal status: INITIAL  LONG TERM GOALS: Target date: ***  *** Baseline:  Goal status: INITIAL  2.  *** Baseline:  Goal status: INITIAL  3.  *** Baseline:  Goal status: INITIAL  4.  *** Baseline:  Goal status: INITIAL  5.  *** Baseline:  Goal status: INITIAL  6.  *** Baseline:  Goal status: INITIAL  PLAN:  PT FREQUENCY: {rehab frequency:25116}  PT DURATION: {rehab duration:25117}  PLANNED INTERVENTIONS: {rehab planned interventions:25118::"Therapeutic exercises","Therapeutic activity","Neuromuscular re-education","Balance training","Gait training","Patient/Family education","Self Care","Joint mobilization"}.  PLAN FOR NEXT SESSION: Granville Lewis, PT 11/01/2022, 7:57 AM

## 2022-11-07 ENCOUNTER — Ambulatory Visit: Payer: 59 | Attending: Orthopaedic Surgery | Admitting: Physical Therapy

## 2022-11-08 ENCOUNTER — Ambulatory Visit (INDEPENDENT_AMBULATORY_CARE_PROVIDER_SITE_OTHER): Payer: 59 | Admitting: Physician Assistant

## 2022-11-08 ENCOUNTER — Encounter (INDEPENDENT_AMBULATORY_CARE_PROVIDER_SITE_OTHER): Payer: Self-pay | Admitting: Physician Assistant

## 2022-11-08 ENCOUNTER — Other Ambulatory Visit (INDEPENDENT_AMBULATORY_CARE_PROVIDER_SITE_OTHER): Payer: Self-pay | Admitting: Physician Assistant

## 2022-11-08 VITALS — BP 121/74 | HR 57 | Temp 98.2°F | Ht 63.0 in | Wt 220.0 lb

## 2022-11-08 DIAGNOSIS — E669 Obesity, unspecified: Secondary | ICD-10-CM

## 2022-11-08 DIAGNOSIS — F5089 Other specified eating disorder: Secondary | ICD-10-CM

## 2022-11-08 DIAGNOSIS — Z6838 Body mass index (BMI) 38.0-38.9, adult: Secondary | ICD-10-CM

## 2022-11-08 DIAGNOSIS — E88819 Insulin resistance, unspecified: Secondary | ICD-10-CM | POA: Diagnosis not present

## 2022-11-08 DIAGNOSIS — E559 Vitamin D deficiency, unspecified: Secondary | ICD-10-CM

## 2022-11-08 MED ORDER — VITAMIN D (ERGOCALCIFEROL) 1.25 MG (50000 UNIT) PO CAPS
50000.0000 [IU] | ORAL_CAPSULE | ORAL | 0 refills | Status: DC
Start: 2022-11-08 — End: 2022-11-29

## 2022-11-08 MED ORDER — METFORMIN HCL 500 MG PO TABS: ORAL_TABLET | ORAL | 0 refills | Status: DC

## 2022-11-08 MED ORDER — TOPIRAMATE 50 MG PO TABS: 50.0000 mg | ORAL_TABLET | Freq: Two times a day (BID) | ORAL | 0 refills | Status: DC

## 2022-11-08 NOTE — Progress Notes (Signed)
.smr  Office: [phone]  /  Fax: [phone]  WEIGHT SUMMARY AND BIOMETRICS  Vitals Temp: 98.2 F (36.8 C) BP: 121/74 Pulse Rate: (!) 57 SpO2: 100 %   Anthropometric Measurements Height: 5\' 3"  (1.6 m) Weight: 220 lb (99.8 kg) BMI (Calculated): 38.98 Weight at Last Visit: 213 lb Weight Lost Since Last Visit: 0 Weight Gained Since Last Visit: 7 lb Starting Weight: 235 lb Total Weight Loss (lbs): 15 lb (6.804 kg) Peak Weight: 256 lb   Body Composition  Body Fat %: 44.4 % Fat Mass (lbs): 97.8 lbs Muscle Mass (lbs): 116.2 lbs Total Body Water (lbs): 82.4 lbs Visceral Fat Rating : 11   Other Clinical Data Fasting: yes Labs: no Today's Visit #: 31 Starting Date: 02/20/20     HPI  Chief Complaint: OBESITY  Brittney Farmer is here to discuss her progress with her obesity treatment plan. She is on the the Category 2 Plan and states she is following her eating plan approximately 0 % of the time. She states she is exercising 0 minutes 0 times per week.   Interval History:  Since last office visit she is up 7 lbs. She went on vacation, then had Birthday celebrations and July 4th celebrations and got off track with her plan. She stopped taking usual metformin and topamax and wants to get back on track with her medications, nutrition plan and exercising.   Hunger/appetite-fair control Cravings- increased cravings for ice cream and we discussed better alternatives like Yasso bars and Out shine bars Exercise-Plans to resume regular exercise as before at gym.  Hydration-needs to increase water intake   Discussed increasing fiber in diet to 3 servings of Smart fruit or vegetables daily.  Discussed adding in Psyllium husk capsules 2-4 daily to increase prebiotic fiber intake.    Pharmacotherapy: Metformin for insulin resistance. Topamax for cravings. Reports stopped taking over past few weeks. Did note some diarrhea when ate simple carbohydrates with metformin, but otherwise no  side effects with medications and wants to get back on medications.   TREATMENT PLAN FOR OBESITY:  Recommended Dietary Goals  Brittney Farmer is currently in the action stage of change. As such, her goal is to continue weight management plan. She has agreed to the Category 2 Plan.  Behavioral Intervention  We discussed the following Behavioral Modification Strategies today: increasing lean protein intake, decreasing simple carbohydrates , increasing vegetables, increasing lower glycemic fruits, increasing fiber rich foods, increasing water intake, decreasing eating out or consumption of processed foods, and making healthy choices when eating convenient foods, continue to practice mindfulness when eating, planning for success, and better snacking choices.  Additional resources provided today Smart Fruit hand out  Recommended Physical Activity Goals  Brittney Farmer has been advised to work up to 150 minutes of moderate intensity aerobic activity a week and strengthening exercises 2-3 times per week for cardiovascular health, weight loss maintenance and preservation of muscle mass.   She has agreed to  Resume usual gym exercises on a regular basis.    Pharmacotherapy We discussed various medication options to help Brittney Farmer with her weight loss efforts and we both agreed to resume metformin 500 mg twice daily and topamax 50 mg twice daily.    Return in about 3 weeks (around 11/29/2022).Marland Kitchen She was informed of the importance of frequent follow up visits to maximize her success with intensive lifestyle modifications for her multiple health conditions.  PHYSICAL EXAM:  Blood pressure 121/74, pulse (!) 57, temperature 98.2 F (36.8 C), height 5\' 3"  (1.6  m), weight 220 lb (99.8 kg), SpO2 100 %. Body mass index is 38.97 kg/m.  General: She is overweight, cooperative, alert, well developed, and in no acute distress. PSYCH: Has normal mood, affect and thought process.   Cardiovascular: HR 50's regular, BP  121/74 Lungs: Normal breathing effort, no conversational dyspnea. Neuro: no focal deficit  DIAGNOSTIC DATA REVIEWED:  BMET    Component Value Date/Time   NA 139 07/12/2022 0912   K 4.8 07/12/2022 0912   CL 105 07/12/2022 0912   CO2 20 07/12/2022 0912   GLUCOSE 89 07/12/2022 0912   GLUCOSE 75 10/04/2012 1238   BUN 8 07/12/2022 0912   CREATININE 0.77 07/12/2022 0912   CREATININE 0.59 10/04/2012 1238   CALCIUM 9.3 07/12/2022 0912   GFRNONAA 111 02/20/2020 1012   GFRNONAA >89 10/04/2012 1238   GFRAA 128 02/20/2020 1012   GFRAA >89 10/04/2012 1238   Lab Results  Component Value Date   HGBA1C 5.3 05/10/2022   HGBA1C 5.1 02/20/2020   Lab Results  Component Value Date   INSULIN 10.0 05/10/2022   INSULIN 5.2 02/20/2020   Lab Results  Component Value Date   TSH 1.220 07/12/2022   CBC    Component Value Date/Time   WBC 6.8 07/12/2022 0912   WBC 7.8 10/18/2021 0820   RBC 4.70 07/12/2022 0912   RBC 4.52 10/18/2021 0820   HGB 13.3 07/12/2022 0912   HCT 41.5 07/12/2022 0912   PLT 320 07/12/2022 0912   MCV 88 07/12/2022 0912   MCH 28.3 07/12/2022 0912   MCH 29.2 10/18/2021 0820   MCHC 32.0 07/12/2022 0912   MCHC 32.7 10/18/2021 0820   RDW 13.7 07/12/2022 0912   Iron Studies    Component Value Date/Time   IRON 38 02/20/2020 1012   TIBC 284 02/20/2020 1012   FERRITIN 103 02/20/2020 1012   IRONPCTSAT 13 (L) 02/20/2020 1012   Lipid Panel     Component Value Date/Time   CHOL 162 07/12/2022 0912   TRIG 66 07/12/2022 0912   HDL 48 07/12/2022 0912   CHOLHDL 3.4 07/12/2022 0912   LDLCALC 101 (H) 07/12/2022 0912   Hepatic Function Panel     Component Value Date/Time   PROT 6.3 07/12/2022 0912   ALBUMIN 4.2 07/12/2022 0912   AST 13 07/12/2022 0912   ALT 10 07/12/2022 0912   ALKPHOS 87 07/12/2022 0912   BILITOT 0.3 07/12/2022 0912   BILIDIR 0.1 10/04/2012 1238   IBILI 0.2 10/04/2012 1238      Component Value Date/Time   TSH 1.220 07/12/2022 0912    Nutritional Lab Results  Component Value Date   VD25OH 27.7 (L) 05/10/2022   VD25OH 41.2 11/11/2021   VD25OH 40.6 05/12/2021    ASSOCIATED CONDITIONS ADDRESSED TODAY  ASSESSMENT AND PLAN  Problem List Items Addressed This Visit     Obesity Start BMI 41.63 Date 02/20/20   Vitamin D deficiency - Primary   Insulin resistance   Eating disorder   Vitamin D Deficiency Vitamin D is not at goal of 50.  Most recent vitamin D level was 27.7. She is on  prescription ergocalciferol 50,000 IU weekly. No side effects with Ergocalciferol.  Lab Results  Component Value Date   VD25OH 27.7 (L) 05/10/2022   VD25OH 41.2 11/11/2021   VD25OH 40.6 05/12/2021    Plan: Continue and refill  prescription ergocalciferol 50,000 IU weekly Low vitamin D levels can be associated with adiposity and may result in leptin resistance and weight gain. Also associated  with fatigue. Currently on vitamin D supplementation without any adverse effects.   Insulin Resistance Last fasting insulin was 10.0- not at goal. A1c was 5.3. Polyphagia:Yes Medication(s): Metformin 500 mg twice daily with meals No GI or other side effects with metformin.  Lab Results  Component Value Date   HGBA1C 5.3 05/10/2022   HGBA1C 5.2 11/11/2021   HGBA1C 5.1 02/20/2020   Lab Results  Component Value Date   INSULIN 10.0 05/10/2022   INSULIN 12.5 11/11/2021   INSULIN 5.2 02/20/2020    Plan: Continue and refill Metformin 500 mg twice daily with meals Continue working on nutrition plan to decrease simple carbohydrates, increase lean proteins and exercise to promote weight loss, improve glycemic control and prevent progression to Type 2 diabetes.    Eating disorder/emotional eating Brittney Farmer has had issues with stress/emotional eating. Currently this is poorly controlled. Overall mood is stable. Medication(s): Topiramate 50 mg twice daily.   Plan: Continue and refill Topiramate 50 mg twice daily.  Continue to work on  emotional eating strategies.    ATTESTASTION STATEMENTS:  Reviewed by clinician on day of visit: allergies, medications, problem list, medical history, surgical history, family history, social history, and previous encounter notes.   I have personally spent 35 minutes total time today in preparation, patient care, nutritional counseling and documentation for this visit, including the following: review of clinical lab tests; review of medical tests/procedures/services.      Zyair Rhein, PA-C

## 2022-11-22 ENCOUNTER — Ambulatory Visit (INDEPENDENT_AMBULATORY_CARE_PROVIDER_SITE_OTHER): Payer: 59 | Admitting: Nurse Practitioner

## 2022-11-22 ENCOUNTER — Ambulatory Visit (INDEPENDENT_AMBULATORY_CARE_PROVIDER_SITE_OTHER): Payer: 59

## 2022-11-22 ENCOUNTER — Encounter: Payer: Self-pay | Admitting: Nurse Practitioner

## 2022-11-22 VITALS — BP 135/83 | HR 70 | Temp 97.0°F | Resp 20 | Ht 63.0 in | Wt 222.0 lb

## 2022-11-22 DIAGNOSIS — M25511 Pain in right shoulder: Secondary | ICD-10-CM | POA: Diagnosis not present

## 2022-11-22 DIAGNOSIS — S322XXA Fracture of coccyx, initial encounter for closed fracture: Secondary | ICD-10-CM | POA: Diagnosis not present

## 2022-11-22 DIAGNOSIS — M533 Sacrococcygeal disorders, not elsewhere classified: Secondary | ICD-10-CM | POA: Diagnosis not present

## 2022-11-22 DIAGNOSIS — S3210XA Unspecified fracture of sacrum, initial encounter for closed fracture: Secondary | ICD-10-CM

## 2022-11-22 DIAGNOSIS — M19011 Primary osteoarthritis, right shoulder: Secondary | ICD-10-CM | POA: Diagnosis not present

## 2022-11-22 MED ORDER — IBUPROFEN 800 MG PO TABS
800.0000 mg | ORAL_TABLET | Freq: Three times a day (TID) | ORAL | 0 refills | Status: DC | PRN
Start: 2022-11-22 — End: 2024-01-19

## 2022-11-22 NOTE — Patient Instructions (Signed)
Tailbone Injury  A tailbone injury is an injury on the small bone at the lower end of the backbone (spine). The injury can happen if the tailbone is bruised, the ligaments are stretched, or the bone is broken (fracture). What are the causes? A falling and landing on the tailbone. Strain or friction on the tailbone. This comes from sitting for long periods of time, or rowing or biking. Giving birth. What are the signs or symptoms? Pain in the tailbone area or lower back. Pain or difficulty when standing up from a sitting position. Bruising or swelling in the tailbone area. Pain when pooping. In females, pain during sex. How is this treated? Most tailbone injuries get better on their own in 4-6 weeks. If you need treatment, it may include: Taking medicines for pain. Using a rubber or inflated ring or cushion when sitting. Doing physical therapy. Getting shots (injections) of local anesthesia and steroid medicine. Follow these instructions at home: Activity Rest as told by your doctor. Do not sit for a long time without moving. Get up to take short walks every 1 to 2 hours. Ask for help if you feel weak or unsteady. Wear proper pads and gear when riding a bike or rowing. Do exercises as told by your doctor or physical therapist. Increase your activity as the pain allows. Return to your normal activities when your doctor says that it is safe. Managing pain, stiffness, and swelling To lessen pain: Sit on a large rubber or inflated ring or cushion. Lean forward when you sit. If told, put ice on the injured area. To do this: Put ice in a plastic bag. Place a towel between your skin and the bag. Leave the ice on for 20 minutes, 2-3 times per day. Do this for the first 1-2 days. If your skin turns bright red, take off the ice right away to prevent skin damage. The risk of skin damage is higher if you cannot feel pain, heat, or cold. If told, put heat on the injured area. Do this as often  as told by your doctor. Use the heat source that your doctor recommends, such as a moist heat pack or a heating pad. Place a towel between your skin and the heat source. Leave the heat on for 20-30 minutes. If your skin turns bright red, take off the heat right away to prevent burns. The risk of burns is higher if you cannot feel pain, heat, or cold. General instructions Take over-the-counter and prescription medicines only as told by your doctor. You may need to take these actions to prevent or treat trouble pooping (constipation) or pain when pooping: Drink enough fluid to keep your pee (urine) pale yellow. Take over-the-counter or prescription medicines. Eat foods that are high in fiber. These include beans, whole grains, and fresh fruits and vegetables. Limit foods that are high in fat and sugar. These include fried or sweet foods. Contact a doctor if: Your pain gets worse. Your pain is not controlled with medicines. Pooping causes you pain. You cannot poop after 4 days. You have pain during sex. Summary A tailbone injury is an injury on the small bone at the lower end of the backbone (spine). These injuries can be painful. Most tailbone injuries get better on their own in 4-6 weeks. Sit on a large rubber or inflated ring or cushion to lessen pain. Do not sit for a long time without moving. Follow your doctor's suggestions to prevent or treat trouble pooping. This information is not  intended to replace advice given to you by your health care provider. Make sure you discuss any questions you have with your health care provider. Document Revised: 07/27/2021 Document Reviewed: 07/27/2021 Elsevier Patient Education  2024 [employer].

## 2022-11-22 NOTE — Progress Notes (Signed)
Subjective:    Patient ID: Brittney Farmer, female    DOB: [birth year], 37 y.o.   MRN: [id number]   Chief Complaint: Tailbone Pain and Shoulder Pain (Right shoulder. Thrown off horse on July 13)   Shoulder Pain     Patient was thrown off of a horse on July 13. She landed on her buttocks when she fell. She is c/o tailbone hurting. Rates pain 5-6/10. Worse when sitting down. She also jarred her right shoulder when she fell. She has had shoulder issues in the past and she thinks the fall just aggravated it. Patient Active Problem List   Diagnosis Date Noted   BMI 38.0-38.9,adult Current BMI 38.8 08/09/2022   BMI 40.0-44.9, adult (HCC)-current bmi 41.1 06/23/2022   Eating disorder 05/03/2022   Class 3 severe obesity with serious comorbidity and body mass index (BMI) of 40.0 to 44.9 in adult (HCC) 05/03/2022   Precordial chest pain 01/25/2022   SVT (supraventricular tachycardia) 01/24/2022   B12 deficiency 11/11/2021   Insulin resistance 11/11/2021   Umbilical hernia without obstruction and without gangrene    Nausea with vomiting    Rectal bleeding    At risk for activity intolerance 05/07/2020   Other hyperlipidemia 03/05/2020   Vitamin D deficiency 03/05/2020   Depression 03/05/2020   Other fatigue 02/20/2020   SOB (shortness of breath) on exertion 02/20/2020   Other irritable bowel syndrome 02/20/2020   Absolute anemia 02/20/2020   Mood disorder with emotional eating 02/20/2020   At risk for impaired metabolic function 02/20/2020   Allergic rhinitis 10/31/2019   Gastroesophageal reflux disease without esophagitis 08/08/2019   Migraine 07/12/2019   Obesity Start BMI 41.63 Date 02/20/20 12/22/2014   GAD (generalized anxiety disorder) 06/06/2014   IBS (irritable bowel syndrome) 06/06/2014       Review of Systems  Musculoskeletal:        Right shoulder buttocks       Objective:   Physical Exam Constitutional:      Appearance: Normal appearance. She is obese.   Musculoskeletal:     Comments: FROM of right shoulder without  pain Coccyx pain on palpation  Skin:    General: Skin is warm.  Neurological:     General: No focal deficit present.     Mental Status: She is alert and oriented to person, place, and time.  Psychiatric:        Mood and Affect: Mood normal.        Behavior: Behavior normal.    BP 135/83   Pulse 70   Temp (!) 97 F (36.1 C) (Temporal)   Resp 20   Ht 5\' 3"  (1.6 m)   Wt 222 lb (100.7 kg)   SpO2 96%   BMI 39.33 kg/m   Right shoulder- normal-Preliminary reading by Paulene Floor, FNP  [facility]  Coccyx fracture-Preliminary reading by Paulene Floor, FNP  [facility]        Assessment & Plan:  Brittney Farmer in today with chief complaint of Tailbone Pain and Shoulder Pain (Right shoulder. Thrown off horse on July 13)   1. Pain in the coccyx - DG Sacrum/Coccyx  2. Closed fracture of sacrum and coccyx, initial encounter (HCC) Continue to seat on donut Motrin q6 hours No horse back riding for at least 6 more weeks  3. Acute pain of right shoulder Rest Motrin should help - DG Shoulder Right    The above assessment and management plan was discussed with the patient. The patient verbalized understanding of and has  agreed to the management plan. Patient is aware to call the clinic if symptoms persist or worsen. Patient is aware when to return to the clinic for a follow-up visit. Patient educated on when it is appropriate to go to the emergency department.   Brittney Daphine Deutscher, FNP

## 2022-11-29 ENCOUNTER — Ambulatory Visit (INDEPENDENT_AMBULATORY_CARE_PROVIDER_SITE_OTHER): Payer: 59 | Admitting: Family Medicine

## 2022-11-29 VITALS — BP 140/85 | HR 68 | Temp 98.2°F | Ht 63.0 in | Wt 221.0 lb

## 2022-11-29 DIAGNOSIS — E669 Obesity, unspecified: Secondary | ICD-10-CM | POA: Diagnosis not present

## 2022-11-29 DIAGNOSIS — E559 Vitamin D deficiency, unspecified: Secondary | ICD-10-CM | POA: Diagnosis not present

## 2022-11-29 DIAGNOSIS — E88819 Insulin resistance, unspecified: Secondary | ICD-10-CM

## 2022-11-29 DIAGNOSIS — Z6839 Body mass index (BMI) 39.0-39.9, adult: Secondary | ICD-10-CM | POA: Diagnosis not present

## 2022-11-29 DIAGNOSIS — Z6837 Body mass index (BMI) 37.0-37.9, adult: Secondary | ICD-10-CM

## 2022-11-29 DIAGNOSIS — F5089 Other specified eating disorder: Secondary | ICD-10-CM

## 2022-11-29 MED ORDER — VITAMIN D (ERGOCALCIFEROL) 1.25 MG (50000 UNIT) PO CAPS
50000.0000 [IU] | ORAL_CAPSULE | ORAL | 0 refills | Status: AC
Start: 2022-11-29 — End: ?

## 2022-11-29 NOTE — Progress Notes (Signed)
[name], D.O.  ABFM, ABOM Specializing in Clinical Bariatric Medicine  Office located at: [address] [place], [place]  [address]     Assessment and Plan:   Medications Discontinued During This Encounter  Medication Reason   Vitamin D, Ergocalciferol, (DRISDOL) 1.25 MG (50000 UNIT) CAPS capsule Reorder     Meds ordered this encounter  Medications   Vitamin D, Ergocalciferol, (DRISDOL) 1.25 MG (50000 UNIT) CAPS capsule    Sig: Take 1 capsule (50,000 Units total) by mouth every 7 (seven) days.    Dispense:  4 capsule    Refill:  0    Next OV: recheck Vitamin D, Cholesterol, A1c, fasting insulin   Insulin resistance Assessment & Plan: Condition is being treated with Metformin 500 mg 1.5 tablets twice daily. Pt endorses not taking Metformin since early July. Reports still having cravings for sweets, especially ice-cream.   Lab Results  Component Value Date   HGBA1C 5.3 05/10/2022   HGBA1C 5.2 11/11/2021   HGBA1C 5.1 02/20/2020   INSULIN 10.0 05/10/2022   INSULIN 12.5 11/11/2021   INSULIN 5.2 02/20/2020    Recheck labs next OV. Encouraged pt to take Metformin at current dose to help with cravings and to decrease the risk of progressing to prediabetes.  Also reminded patient that having protein with each meal is important for controlling cravings.  Continue her prudent nutritional plan that is low in simple carbohydrates, saturated fats and trans fats to goal of 5-10% weight loss to achieve significant health benefits.   Vitamin D deficiency Assessment & Plan: Pt reports good compliance and tolerance with ERGO 50K lU once weekly. Denies any side effects.   Lab Results  Component Value Date   VD25OH 27.7 (L) 05/10/2022   VD25OH 41.2 11/11/2021   VD25OH 40.6 05/12/2021   Recheck Vitamin D levels next OV. Continue with their weight loss efforts and ERGO at current dose to improve this condition. Will refill ERGO today.      TREATMENT PLAN FOR  OBESITY: BMI 37.0-37.9, adult Current BMI 39.16 Obesity- Start BMI 41.63 Date 02/20/20 Assessment & Plan:  Terriana Knoff is here to discuss her progress with her obesity treatment plan along with follow-up of her obesity related diagnoses. See Medical Weight Management Flowsheet for complete bioelectrical impedance results.  Condition is improving. Biometric data collected today, was reviewed with patient.   Since last office visit on 11/08/22 patient's  Muscle mass has increased by 4 lbs. Fat mass has decreased by 3.4 lbs. Total body water has increased by 2.4 lb.  Counseling done on how various foods will affect these numbers and how to maximize success  Total lbs lost to date: 14 lbs.  Total weight loss percentage to date:  5.96%  Reminded pt to choose foods with a 10:1 ratio of calories to protein. Continue with Category 2 meal plan with b/l options with 6 ounces of lean protein at lunch and 8 ounces at dinner.  Behavioral Intervention Additional resources provided today: category 2 meal plan information, breakfast options, lunch options, and Eating Out Guide Evidence-based interventions for health behavior change were utilized today including the discussion of self monitoring techniques, problem-solving barriers and SMART goal setting techniques.   Regarding patient's less desirable eating habits and patterns, we employed the technique of small changes.  Pt will specifically work on: continuing to increase lean protein intake and lowering intake of inflammatory foods for next visit.    Recommended Physical Activity Goals  Shamra has been  advised to slowly work up to 150 minutes of moderate intensity aerobic activity a week and strengthening exercises 2-3 times per week for cardiovascular health, weight loss maintenance and preservation of muscle mass. She has agreed to Continue current level of physical activity   FOLLOW UP: Return in about 13 days (around 12/12/2022). She was informed  of the importance of frequent follow up visits to maximize her success with intensive lifestyle modifications for her multiple health conditions.  Subjective:   Chief complaint: Obesity Tarsha is here to discuss her progress with her obesity treatment plan. She is on the Category 2 Plan and states she is following her eating plan approximately 0% of the time. She states she is not exercising due to broken tailbone.  Interval History:  Mechell Southard is here for a follow up office visit. Since last OV, Leiani endorses being stressed and slightly depressed due to her closed fracture of sacrum and coccyx (pt fell off horse). Reports not being able to sit or stand for long periods of time. Still endorses having cravings for sweets, especially ice-cream. Per patient, she is not taking Metformin and occasionally takes Topamax. Denies refill for both medicines today.   Pharmacotherapy for weight loss: She is perscribed  Metformin 500 mg 1.5 tablet twice daily and Topamax 50 mg bid  for medical weight loss.  Denies side effects.    Review of Systems:  Pertinent positives were addressed with patient today.  Reviewed by clinician on day of visit: allergies, medications, problem list, medical history, surgical history, family history, social history, and previous encounter notes.  Weight Summary and Biometrics   Weight Lost Since Last Visit: 0lb  Weight Gained Since Last Visit: 1lb    Vitals Temp: 98.2 F (36.8 C) BP: (!) 140/85 Pulse Rate: 68 SpO2: 100 %   Anthropometric Measurements Height: 5\' 3"  (1.6 m) Weight: 221 lb (100.2 kg) BMI (Calculated): 39.16 Weight at Last Visit: 220lb Weight Lost Since Last Visit: 0lb Weight Gained Since Last Visit: 1lb Starting Weight: 235lb Total Weight Loss (lbs): 14 lb (6.35 kg) Peak Weight: 256lb   Body Composition  Body Fat %: 42.7 % Fat Mass (lbs): 94.4 lbs Muscle Mass (lbs): 120.2 lbs Total Body Water (lbs): 84.8 lbs Visceral Fat Rating  : 11   Other Clinical Data Fasting: no Labs: no Today's Visit #: 32 Starting Date: 02/20/20   Objective:   PHYSICAL EXAM: Blood pressure (!) 140/85, pulse 68, temperature 98.2 F (36.8 C), height 5\' 3"  (1.6 m), weight 221 lb (100.2 kg), SpO2 100%. Body mass index is 39.15 kg/m.  General: Well Developed, well nourished, and in no acute distress.  HEENT: Normocephalic, atraumatic Skin: Warm and dry, cap RF less 2 sec, good turgor Chest:  Normal excursion, shape, no gross abn Respiratory: speaking in full sentences, no conversational dyspnea NeuroM-Sk: Ambulates w/o assistance, moves * 4 Psych: A and O *3, insight good, mood-full  DIAGNOSTIC DATA REVIEWED:  BMET    Component Value Date/Time   NA 139 07/12/2022 0912   K 4.8 07/12/2022 0912   CL 105 07/12/2022 0912   CO2 20 07/12/2022 0912   GLUCOSE 89 07/12/2022 0912   GLUCOSE 75 10/04/2012 1238   BUN 8 07/12/2022 0912   CREATININE 0.77 07/12/2022 0912   CREATININE 0.59 10/04/2012 1238   CALCIUM 9.3 07/12/2022 0912   GFRNONAA 111 02/20/2020 1012   GFRNONAA >89 10/04/2012 1238   GFRAA 128 02/20/2020 1012   GFRAA >89 10/04/2012 1238   Lab Results  Component Value Date   HGBA1C 5.3 05/10/2022   HGBA1C 5.1 02/20/2020   Lab Results  Component Value Date   INSULIN 10.0 05/10/2022   INSULIN 5.2 02/20/2020   Lab Results  Component Value Date   TSH 1.220 07/12/2022   CBC    Component Value Date/Time   WBC 6.8 07/12/2022 0912   WBC 7.8 10/18/2021 0820   RBC 4.70 07/12/2022 0912   RBC 4.52 10/18/2021 0820   HGB 13.3 07/12/2022 0912   HCT 41.5 07/12/2022 0912   PLT 320 07/12/2022 0912   MCV 88 07/12/2022 0912   MCH 28.3 07/12/2022 0912   MCH 29.2 10/18/2021 0820   MCHC 32.0 07/12/2022 0912   MCHC 32.7 10/18/2021 0820   RDW 13.7 07/12/2022 0912   Iron Studies    Component Value Date/Time   IRON 38 02/20/2020 1012   TIBC 284 02/20/2020 1012   FERRITIN 103 02/20/2020 1012   IRONPCTSAT 13 (L) 02/20/2020  1012   Lipid Panel     Component Value Date/Time   CHOL 162 07/12/2022 0912   TRIG 66 07/12/2022 0912   HDL 48 07/12/2022 0912   CHOLHDL 3.4 07/12/2022 0912   LDLCALC 101 (H) 07/12/2022 0912   Hepatic Function Panel     Component Value Date/Time   PROT 6.3 07/12/2022 0912   ALBUMIN 4.2 07/12/2022 0912   AST 13 07/12/2022 0912   ALT 10 07/12/2022 0912   ALKPHOS 87 07/12/2022 0912   BILITOT 0.3 07/12/2022 0912   BILIDIR 0.1 10/04/2012 1238   IBILI 0.2 10/04/2012 1238      Component Value Date/Time   TSH 1.220 07/12/2022 0912   Nutritional Lab Results  Component Value Date   VD25OH 27.7 (L) 05/10/2022   VD25OH 41.2 11/11/2021   VD25OH 40.6 05/12/2021    Attestations:   I, Special Puri , acting as a Stage manager for Thomasene Lot, DO., have compiled all relevant documentation for today's office visit on behalf of Thomasene Lot, DO, while in the presence of [employer], DO.  I have reviewed the above documentation for accuracy and completeness, and I agree with the above. [name], D.O.  The 21st Century Cures Act was signed into law in 2016 which includes the topic of electronic health records.  This provides immediate access to information in MyChart.  This includes consultation notes, operative notes, office notes, lab results and pathology reports.  If you have any questions about what you read please let us know at your next visit so we can discuss your concerns and take corrective action if need be.  We are right here with you.

## 2022-12-06 ENCOUNTER — Other Ambulatory Visit (INDEPENDENT_AMBULATORY_CARE_PROVIDER_SITE_OTHER): Payer: Self-pay | Admitting: Physician Assistant

## 2022-12-06 DIAGNOSIS — F5089 Other specified eating disorder: Secondary | ICD-10-CM

## 2022-12-12 ENCOUNTER — Ambulatory Visit (INDEPENDENT_AMBULATORY_CARE_PROVIDER_SITE_OTHER): Payer: 59 | Admitting: Physician Assistant

## 2022-12-18 ENCOUNTER — Other Ambulatory Visit (INDEPENDENT_AMBULATORY_CARE_PROVIDER_SITE_OTHER): Payer: Self-pay | Admitting: Family Medicine

## 2022-12-18 DIAGNOSIS — E559 Vitamin D deficiency, unspecified: Secondary | ICD-10-CM

## 2023-01-10 ENCOUNTER — Ambulatory Visit (INDEPENDENT_AMBULATORY_CARE_PROVIDER_SITE_OTHER): Payer: 59 | Admitting: Family Medicine

## 2023-01-31 ENCOUNTER — Encounter (INDEPENDENT_AMBULATORY_CARE_PROVIDER_SITE_OTHER): Payer: Self-pay | Admitting: Physician Assistant

## 2023-01-31 ENCOUNTER — Ambulatory Visit (INDEPENDENT_AMBULATORY_CARE_PROVIDER_SITE_OTHER): Payer: 59 | Admitting: Physician Assistant

## 2023-01-31 VITALS — BP 117/76 | HR 78 | Temp 98.1°F | Ht 63.0 in | Wt 225.0 lb

## 2023-01-31 DIAGNOSIS — E538 Deficiency of other specified B group vitamins: Secondary | ICD-10-CM

## 2023-01-31 DIAGNOSIS — E7849 Other hyperlipidemia: Secondary | ICD-10-CM

## 2023-01-31 DIAGNOSIS — Z6841 Body Mass Index (BMI) 40.0 and over, adult: Secondary | ICD-10-CM | POA: Diagnosis not present

## 2023-01-31 DIAGNOSIS — R5383 Other fatigue: Secondary | ICD-10-CM

## 2023-01-31 DIAGNOSIS — F5089 Other specified eating disorder: Secondary | ICD-10-CM

## 2023-01-31 DIAGNOSIS — F331 Major depressive disorder, recurrent, moderate: Secondary | ICD-10-CM

## 2023-01-31 DIAGNOSIS — E88819 Insulin resistance, unspecified: Secondary | ICD-10-CM

## 2023-01-31 DIAGNOSIS — E559 Vitamin D deficiency, unspecified: Secondary | ICD-10-CM

## 2023-01-31 DIAGNOSIS — E669 Obesity, unspecified: Secondary | ICD-10-CM

## 2023-01-31 MED ORDER — TOPIRAMATE 50 MG PO TABS
50.0000 mg | ORAL_TABLET | Freq: Two times a day (BID) | ORAL | 0 refills | Status: DC
Start: 2023-01-31 — End: 2023-02-28

## 2023-01-31 MED ORDER — METFORMIN HCL 500 MG PO TABS
ORAL_TABLET | ORAL | 0 refills | Status: DC
Start: 2023-01-31 — End: 2023-02-28

## 2023-01-31 MED ORDER — VITAMIN D (ERGOCALCIFEROL) 1.25 MG (50000 UNIT) PO CAPS
50000.0000 [IU] | ORAL_CAPSULE | ORAL | 0 refills | Status: DC
Start: 2023-01-31 — End: 2023-02-28

## 2023-01-31 MED ORDER — BUPROPION HCL ER (SR) 150 MG PO TB12
150.0000 mg | ORAL_TABLET | Freq: Every day | ORAL | 0 refills | Status: DC
Start: 2023-01-31 — End: 2023-02-28

## 2023-01-31 NOTE — Progress Notes (Signed)
.[facility]  Office: [phone]  /  Fax: [phone]  WEIGHT SUMMARY AND BIOMETRICS  Vitals Temp: 98.1 F (36.7 C) BP: 117/76 Pulse Rate: 78 SpO2: 99 %   Anthropometric Measurements Height: 5\' 3"  (1.6 m) Weight: 225 lb (102.1 kg) BMI (Calculated): 39.87 Weight at Last Visit: 221 lb Weight Lost Since Last Visit: 0 Weight Gained Since Last Visit: 4 lb Starting Weight: 235 lb Total Weight Loss (lbs): 10 lb (4.536 kg) Peak Weight: 256 lb   Body Composition  Body Fat %: 45 % Fat Mass (lbs): 101.6 lbs Muscle Mass (lbs): 118 lbs Total Body Water (lbs): 82.2 lbs Visceral Fat Rating : 11   Other Clinical Data Fasting: yes Labs: no Today's Visit #: 33 Starting Date: 02/20/20     HPI  Chief Complaint: OBESITY  Thalia is here to discuss her progress with her obesity treatment plan. She is on the the Category 2 Plan  with breakfast and lunch options and states she is following her eating plan approximately 0 % of the time. She states she is exercising 0 minutes 0 times per week.   Interval History:  Since last office visit she is up 4 lbs.   A 37 year old female with a history of prediabetes, vitamin D deficiency, and obesity presents for a follow-up visit regarding her obesity treatment plan.  She reports a recent disruption in her routine due to a fractured tailbone, which has led to a lack of motivation and ambition.  She describes feelings of depression and frustration with her ongoing struggle to lose weight, despite trying various diets and exercise routines.  She also mentions intense food cravings, particularly for sweets, which she finds difficult to manage.  She had been taking metformin for prediabetes, ergocalciferol for vitamin D deficiency, and topiramate for cravings but  reports a period of non-adherence to her medications due to her injury and subsequent lack of motivation over the past couple of months.  She reports feeling depressed as well. She has been  on bupropion and lexapro with good results in the past and is agreeable to re-starting bupropion at this time. Denies any suicidal ideation or other concerns. Reports has good supports and knows to contact someone if symptoms worsening/any concerns.   Full panel of Fasting labs obtained today.  She was informed we would discuss her lab results at her next visit unless there is a critical issue that needs to be addressed sooner. She agreed to keep her next visit at the agreed upon time to discuss these results.   Pharmacotherapy: Metformin for insulin resistance. Topamax for cravings. Stopped taking over the past couple of months.  No side effects with medications and wants to get back on medications.  On bupropion and lexapro in past for depression and agreeable to resume bupropion at this time which may also help with emotional eating/cravings and increase energy.     TREATMENT PLAN FOR OBESITY:  Recommended Dietary Goals  Gabriella is currently in the action stage of change. As such, her goal is to continue weight management plan. She has agreed to the Category 2 Plan and keeping a food journal and adhering to recommended goals of 400-450 calories and 35 grams of protein at dinner meal.   Behavioral Intervention  We discussed the following Behavioral Modification Strategies today: increasing lean protein intake, decreasing simple carbohydrates , increasing vegetables, increasing lower glycemic fruits, avoiding skipping meals, increasing water intake, work on tracking and journaling calories using tracking application, emotional eating strategies and understanding the  difference between hunger signals and cravings, work on managing stress, creating time for self-care and relaxation measures, continue to practice mindfulness when eating, and planning for success.  Additional resources provided today: NA  Recommended Physical Activity Goals  Khari has been advised to work up to 150 minutes of  moderate intensity aerobic activity a week and strengthening exercises 2-3 times per week for cardiovascular health, weight loss maintenance and preservation of muscle mass.   She has agreed to Continue current level of physical activity  and Think about ways to increase daily physical activity and overcoming barriers to exercise   Pharmacotherapy We discussed various medication options to help Ruchy with her weight loss efforts and we both agreed to resume metformin for insulin resistance, topamax for emotional eating/cravings and vitamin D supplement for deficiency and start bupropion for depressed mood/cravings/emotional eating .    Return in about 3 weeks (around 02/21/2023).Marland Kitchen She was informed of the importance of frequent follow up visits to maximize her success with intensive lifestyle modifications for her multiple health conditions.  PHYSICAL EXAM:  Blood pressure 117/76, pulse 78, temperature 98.1 F (36.7 C), height 5\' 3"  (1.6 m), weight 225 lb (102.1 kg), SpO2 99%. Body mass index is 39.86 kg/m.  General: She is overweight, cooperative, alert, well developed, and in no acute distress. PSYCH: Has normal mood, affect and thought process.   Cardiovascular: HR 70's BP 117/76 Lungs: Normal breathing effort, no conversational dyspnea. Neuro: no focal deficits.   DIAGNOSTIC DATA REVIEWED:  BMET    Component Value Date/Time   NA 139 07/12/2022 0912   K 4.8 07/12/2022 0912   CL 105 07/12/2022 0912   CO2 20 07/12/2022 0912   GLUCOSE 89 07/12/2022 0912   GLUCOSE 75 10/04/2012 1238   BUN 8 07/12/2022 0912   CREATININE 0.77 07/12/2022 0912   CREATININE 0.59 10/04/2012 1238   CALCIUM 9.3 07/12/2022 0912   GFRNONAA 111 02/20/2020 1012   GFRNONAA >89 10/04/2012 1238   GFRAA 128 02/20/2020 1012   GFRAA >89 10/04/2012 1238   Lab Results  Component Value Date   HGBA1C 5.3 05/10/2022   HGBA1C 5.1 02/20/2020   Lab Results  Component Value Date   INSULIN 10.0 05/10/2022    INSULIN 5.2 02/20/2020   Lab Results  Component Value Date   TSH 1.220 07/12/2022   CBC    Component Value Date/Time   WBC 6.8 07/12/2022 0912   WBC 7.8 10/18/2021 0820   RBC 4.70 07/12/2022 0912   RBC 4.52 10/18/2021 0820   HGB 13.3 07/12/2022 0912   HCT 41.5 07/12/2022 0912   PLT 320 07/12/2022 0912   MCV 88 07/12/2022 0912   MCH 28.3 07/12/2022 0912   MCH 29.2 10/18/2021 0820   MCHC 32.0 07/12/2022 0912   MCHC 32.7 10/18/2021 0820   RDW 13.7 07/12/2022 0912   Iron Studies    Component Value Date/Time   IRON 38 02/20/2020 1012   TIBC 284 02/20/2020 1012   FERRITIN 103 02/20/2020 1012   IRONPCTSAT 13 (L) 02/20/2020 1012   Lipid Panel     Component Value Date/Time   CHOL 162 07/12/2022 0912   TRIG 66 07/12/2022 0912   HDL 48 07/12/2022 0912   CHOLHDL 3.4 07/12/2022 0912   LDLCALC 101 (H) 07/12/2022 0912   Hepatic Function Panel     Component Value Date/Time   PROT 6.3 07/12/2022 0912   ALBUMIN 4.2 07/12/2022 0912   AST 13 07/12/2022 0912   ALT 10 07/12/2022 0912  ALKPHOS 87 07/12/2022 0912   BILITOT 0.3 07/12/2022 0912   BILIDIR 0.1 10/04/2012 1238   IBILI 0.2 10/04/2012 1238      Component Value Date/Time   TSH 1.220 07/12/2022 0912   Nutritional Lab Results  Component Value Date   VD25OH 27.7 (L) 05/10/2022   VD25OH 41.2 11/11/2021   VD25OH 40.6 05/12/2021    ASSOCIATED CONDITIONS ADDRESSED TODAY  ASSESSMENT AND PLAN  Problem List Items Addressed This Visit     Obesity Start BMI 41.63 Date 02/20/20   Relevant Medications   metFORMIN (GLUCOPHAGE) 500 MG tablet   Other fatigue   Relevant Orders   TSH   Folate   Iron and TIBC   Ferritin   Other hyperlipidemia   Relevant Orders   Lipid Panel With LDL/HDL Ratio   Vitamin D deficiency   Relevant Medications   Vitamin D, Ergocalciferol, (DRISDOL) 1.25 MG (50000 UNIT) CAPS capsule   Other Relevant Orders   VITAMIN D 25 Hydroxy (Vit-D Deficiency, Fractures)   Depression   Relevant  Medications   buPROPion (WELLBUTRIN SR) 150 MG 12 hr tablet   B12 deficiency   Relevant Orders   Vitamin B12   CBC with Differential/Platelet   Insulin resistance - Primary   Relevant Medications   metFORMIN (GLUCOPHAGE) 500 MG tablet   Other Relevant Orders   CMP14+EGFR   Hemoglobin A1c   Insulin, random   Eating disorder   Relevant Medications   topiramate (TOPAMAX) 50 MG tablet   BMI 40.0-44.9, adult (HCC)-current bmi 41.1   Relevant Medications   metFORMIN (GLUCOPHAGE) 500 MG tablet  Obesity Struggling with weight loss despite various diet attempts and medication use. Experiencing cravings and lack of motivation, possibly related to depression. -Continue Metformin and Topiramate as prescribed. -Consider potential use of Wegovy or Zepbound, pending lab results and insurance coverage. -Order fasting labs to assess for potential qualifying conditions for weight loss medications.  Moderate depression/ Other depression/emotional eating Seini has had issues with stress/emotional eating. Currently this is poorly controlled. Overall mood is stable. Medication(s): Topiramate 50 mg BID- No side effects.   Has not been taking medication consistently over the past 2 months. Wants to resume topamax.  Discussed restarting bupropion which she has responded well to in the past for depressed mood/ low motivation  and she is agreeable to restart some bupropion at this point.  Denies SI/HI or other concerns.   Plan: Continue and refill Topiramate 50mg  BID and start bupropion 150 mg daily.  Depression Reports of low mood, lack of motivation, and fatigue. History of anxiety and depression with previous use of Lexapro and Wellbutrin. Denies suicidal or homicidal thoughts or concerns. Has good supports.  -Resume Wellbutrin, to be taken in the morning for potential activating effects. Monitor closely for response over the next few weeks and titrate dosage as indicated.   Insulin Resistance Last  fasting insulin was 10.0- not at goal. A1c was 5.3- at goal. Polyphagia:Yes Medication(s): Metformin 500 mg twice daily with meals Lab Results  Component Value Date   HGBA1C 5.3 05/10/2022   HGBA1C 5.2 11/11/2021   HGBA1C 5.1 02/20/2020   Lab Results  Component Value Date   INSULIN 10.0 05/10/2022   INSULIN 12.5 11/11/2021   INSULIN 5.2 02/20/2020    Plan: Continue and refill Metformin 500 mg twice daily with meals Continue working on nutrition plan to decrease simple carbohydrates, increase lean proteins and exercise to promote weight loss, improve glycemic control and prevent progression to Type 2 diabetes.  Recheck fasting labs today.   Vitamin D Deficiency Vitamin D is not at goal of 50.  Most recent vitamin D level was 27.7. She is on  prescription ergocalciferol 50,000 IU weekly. Lab Results  Component Value Date   VD25OH 27.7 (L) 05/10/2022   VD25OH 41.2 11/11/2021   VD25OH 40.6 05/12/2021    Plan: Continue and refill  prescription ergocalciferol 50,000 IU weekly Low vitamin D levels can be associated with adiposity and may result in leptin resistance and weight gain. Also associated with fatigue. Currently on vitamin D supplementation without any adverse effects.  Recheck vitamin D level today.   Hyperlipidemia LDL is not at goal. Medication(s): None Cardiovascular risk factors: dyslipidemia, obesity (BMI >= 30 kg/m2), and sedentary lifestyle  Lab Results  Component Value Date   CHOL 162 07/12/2022   HDL 48 07/12/2022   LDLCALC 101 (H) 07/12/2022   TRIG 66 07/12/2022   CHOLHDL 3.4 07/12/2022   CHOLHDL 4.8 (H) 05/10/2022   CHOLHDL 3.8 11/11/2021   Lab Results  Component Value Date   ALT 10 07/12/2022   AST 13 07/12/2022   ALKPHOS 87 07/12/2022   BILITOT 0.3 07/12/2022   The ASCVD Risk score (Arnett DK, et al., 2019) failed to calculate for the following reasons:   The 2019 ASCVD risk score is only valid for ages 69 to 47  Plan:  Continue to work on  Engineer, technical sales -decreasing simple carbohydrates, increasing lean proteins, decreasing saturated fats and cholesterol , avoiding trans fats and exercise as able to promote weight loss, improve lipids and decrease cardiovascular risks. Recheck fasting lipids today.   B 12 deficiency/fatigue:  History of B 12 deficiency in past. Reports very low energy/fatigue.  Plan: Check CBC, B 12, vitamin D, TSH and anemia panel as reports history of iron deficiency as well. Supplement/treatment accordingly.    Follow-up Scheduled appointment on 02/28/2023 to discuss lab results and ongoing management.  ATTESTASTION STATEMENTS:  Reviewed by clinician on day of visit: allergies, medications, problem list, medical history, surgical history, family history, social history, and previous encounter notes.   I have personally spent 48 minutes total time today in preparation, patient care, nutritional counseling and documentation for this visit, including the following: review of clinical lab tests; review of medical tests/procedures/services.      Falyn Rubel, PA-C

## 2023-02-01 LAB — CMP14+EGFR
ALT: 11 [IU]/L (ref 0–32)
AST: 11 [IU]/L (ref 0–40)
Albumin: 4.2 g/dL (ref 3.9–4.9)
Alkaline Phosphatase: 111 [IU]/L (ref 44–121)
BUN/Creatinine Ratio: 12 (ref 9–23)
BUN: 9 mg/dL (ref 6–20)
Bilirubin Total: 0.4 mg/dL (ref 0.0–1.2)
CO2: 22 mmol/L (ref 20–29)
Calcium: 9.2 mg/dL (ref 8.7–10.2)
Chloride: 104 mmol/L (ref 96–106)
Creatinine, Ser: 0.77 mg/dL (ref 0.57–1.00)
Globulin, Total: 2.8 g/dL (ref 1.5–4.5)
Glucose: 94 mg/dL (ref 70–99)
Potassium: 4.7 mmol/L (ref 3.5–5.2)
Sodium: 140 mmol/L (ref 134–144)
Total Protein: 7 g/dL (ref 6.0–8.5)
eGFR: 103 mL/min/{1.73_m2} (ref >59)

## 2023-02-01 LAB — CBC WITH DIFFERENTIAL/PLATELET
Basophils Absolute: 0 10*3/uL (ref 0.0–0.2)
Basos: 1 %
EOS (ABSOLUTE): 0.1 10*3/uL (ref 0.0–0.4)
Eos: 2 %
Hematocrit: 41.9 % (ref 34.0–46.6)
Hemoglobin: 14.1 g/dL (ref 11.1–15.9)
Immature Grans (Abs): 0 10*3/uL (ref 0.0–0.1)
Immature Granulocytes: 0 %
Lymphocytes Absolute: 1.5 10*3/uL (ref 0.7–3.1)
Lymphs: 22 %
MCH: 29.4 pg (ref 26.6–33.0)
MCHC: 33.7 g/dL (ref 31.5–35.7)
MCV: 88 fL (ref 79–97)
Monocytes Absolute: 0.4 10*3/uL (ref 0.1–0.9)
Monocytes: 6 %
Neutrophils Absolute: 4.9 10*3/uL (ref 1.4–7.0)
Neutrophils: 69 %
Platelets: 316 10*3/uL (ref 150–450)
RBC: 4.79 x10E6/uL (ref 3.77–5.28)
RDW: 12.5 % (ref 11.7–15.4)
WBC: 7 10*3/uL (ref 3.4–10.8)

## 2023-02-01 LAB — VITAMIN B12: Vitamin B-12: 801 pg/mL (ref 232–1245)

## 2023-02-01 LAB — LIPID PANEL WITH LDL/HDL RATIO
Cholesterol, Total: 224 mg/dL — ABNORMAL HIGH (ref 100–199)
HDL: 46 mg/dL
LDL Chol Calc (NIH): 165 mg/dL — ABNORMAL HIGH (ref 0–99)
LDL/HDL Ratio: 3.6 ratio — ABNORMAL HIGH (ref 0.0–3.2)
Triglycerides: 72 mg/dL (ref 0–149)
VLDL Cholesterol Cal: 13 mg/dL (ref 5–40)

## 2023-02-01 LAB — IRON AND TIBC
Iron Saturation: 9 % — CL (ref 15–55)
Iron: 29 ug/dL (ref 27–159)
Total Iron Binding Capacity: 307 ug/dL (ref 250–450)
UIBC: 278 ug/dL (ref 131–425)

## 2023-02-01 LAB — HEMOGLOBIN A1C
Est. average glucose Bld gHb Est-mCnc: 105 mg/dL
Hgb A1c MFr Bld: 5.3 % (ref 4.8–5.6)

## 2023-02-01 LAB — VITAMIN D 25 HYDROXY (VIT D DEFICIENCY, FRACTURES): Vit D, 25-Hydroxy: 45.4 ng/mL (ref 30.0–100.0)

## 2023-02-01 LAB — FOLATE: Folate: 6 ng/mL (ref >3.0)

## 2023-02-01 LAB — TSH: TSH: 1.23 u[IU]/mL (ref 0.450–4.500)

## 2023-02-01 LAB — INSULIN, RANDOM: INSULIN: 12.3 u[IU]/mL (ref 2.6–24.9)

## 2023-02-01 LAB — FERRITIN: Ferritin: 97 ng/mL (ref 15–150)

## 2023-02-02 ENCOUNTER — Ambulatory Visit (INDEPENDENT_AMBULATORY_CARE_PROVIDER_SITE_OTHER): Payer: 59

## 2023-02-02 ENCOUNTER — Encounter: Payer: Self-pay | Admitting: Family

## 2023-02-02 ENCOUNTER — Ambulatory Visit (INDEPENDENT_AMBULATORY_CARE_PROVIDER_SITE_OTHER): Payer: 59 | Admitting: Family

## 2023-02-02 VITALS — BP 117/75 | HR 79 | Temp 98.2°F | Ht 63.0 in | Wt 227.4 lb

## 2023-02-02 DIAGNOSIS — R5383 Other fatigue: Secondary | ICD-10-CM | POA: Diagnosis not present

## 2023-02-02 DIAGNOSIS — R079 Chest pain, unspecified: Secondary | ICD-10-CM | POA: Diagnosis not present

## 2023-02-02 DIAGNOSIS — R0602 Shortness of breath: Secondary | ICD-10-CM

## 2023-02-02 DIAGNOSIS — D509 Iron deficiency anemia, unspecified: Secondary | ICD-10-CM | POA: Diagnosis not present

## 2023-02-02 DIAGNOSIS — R002 Palpitations: Secondary | ICD-10-CM

## 2023-02-02 MED ORDER — PINDOLOL 5 MG PO TABS: ORAL_TABLET | ORAL | 1 refills | Status: DC

## 2023-02-02 NOTE — Progress Notes (Signed)
Subjective:    Patient ID: Brittney Farmer, female    DOB: 1985/07/17, 37 y.o.   MRN: [id number]  Chief Complaint  Patient presents with   Chest Pain   Shortness of Breath   elevated heart rate   Joint Swelling    In ankles and pain   Pt presents to the office today with chest pain and SOB that started two weeks ago. Reports her SOB is worse. She was on the carnivore diet last week and was very irritable.   She was seen by [facility] on 01/31/23 and had labs drawn. All were normal except low iron.   She has had chest pain and palpitations in the past and saw Cardiologists and wore Holter monitor. Her last visit with cardiologists was 01/25/22. She was given Pindolol that was helping. She stopped that several months ago because she felt like her symptoms resolved.  Chest Pain  This is a new problem. The current episode started 1 to 4 weeks ago. The problem occurs intermittently. The pain is present in the epigastric region. The pain is moderate. Associated symptoms include headaches, irregular heartbeat, lower extremity edema, malaise/fatigue, nausea, palpitations and shortness of breath. Pertinent negatives include no cough, fever or vomiting. The treatment provided mild relief.  Shortness of Breath Associated symptoms include chest pain and headaches. Pertinent negatives include no fever or vomiting.      Review of Systems  Constitutional:  Positive for malaise/fatigue. Negative for fever.  Respiratory:  Positive for shortness of breath. Negative for cough.   Cardiovascular:  Positive for chest pain and palpitations.  Gastrointestinal:  Positive for nausea. Negative for vomiting.  Neurological:  Positive for headaches.  All other systems reviewed and are negative.      Objective:   Physical Exam Vitals reviewed.  Constitutional:      General: She is not in acute distress.    Appearance: She is well-developed. She is obese.  HENT:     Head: Normocephalic and atraumatic.      Right Ear: External ear normal.  Eyes:     Pupils: Pupils are equal, round, and reactive to light.  Neck:     Thyroid: No thyromegaly.  Cardiovascular:     Rate and Rhythm: Normal rate and regular rhythm.     Heart sounds: Normal heart sounds. No murmur heard. Pulmonary:     Effort: Pulmonary effort is normal. No respiratory distress.     Breath sounds: Normal breath sounds. No wheezing.  Abdominal:     General: Bowel sounds are normal. There is no distension.     Palpations: Abdomen is soft.     Tenderness: There is no abdominal tenderness.  Musculoskeletal:        General: No tenderness. Normal range of motion.     Cervical back: Normal range of motion and neck supple.  Skin:    General: Skin is warm and dry.  Neurological:     Mental Status: She is alert and oriented to person, place, and time.     Cranial Nerves: No cranial nerve deficit.     Deep Tendon Reflexes: Reflexes are normal and symmetric.  Psychiatric:        Behavior: Behavior normal.        Thought Content: Thought content normal.        Judgment: Judgment normal.       BP 117/75   Pulse 79   Temp 98.2 F (36.8 C) (Temporal)   Ht 5\' 3"  (1.6 m)  Wt 227 lb 6.4 oz (103.1 kg)   SpO2 99%   BMI 40.28 kg/m      Assessment & Plan:  Brittney Farmer comes in today with chief complaint of Chest Pain, Shortness of Breath, elevated heart rate, and Joint Swelling (In ankles and pain)   Diagnosis and orders addressed:  1. Chest pain, unspecified type - EKG 12-Lead - pindolol (VISKEN) 5 MG tablet; 2.5 mg in AM and 5 mg in pm  Dispense: 135 tablet; Refill: 1  2. SOB (shortness of breath) - DG Chest 2 View  3. Palpitation - pindolol (VISKEN) 5 MG tablet; 2.5 mg in AM and 5 mg in pm  Dispense: 135 tablet; Refill: 1  4. Other fatigue  5. Iron deficiency anemia, unspecified iron deficiency anemia type Continue iron   Labs refused  Chest x-ray pending  Restart Pindolol BID Limit alcohol and caffeine   Health Maintenance reviewed Diet and exercise encouraged  Follow up plan: Keep chronic follow up   Brittney Rodney, FNP

## 2023-02-02 NOTE — Patient Instructions (Signed)
Palpitations Palpitations are feelings that your heartbeat is irregular or is faster than normal. It may feel like your heart is fluttering or skipping a beat. Palpitations may be caused by many things, including smoking, caffeine, alcohol, stress, and certain medicines or drugs. Most causes of palpitations are not serious.  However, some palpitations can be a sign of a serious problem. Further tests and a thorough medical history will be done to find the cause of your palpitations. Your provider may order tests such as an ECG, labs, an echocardiogram, or an ambulatory continuous ECG monitor. Follow these instructions at home: Pay attention to any changes in your symptoms. Let your health care provider know about them. Take these actions to help manage your symptoms: Eating and drinking Follow instructions from your health care provider about eating or drinking restrictions. You may need to avoid foods and drinks that may cause palpitations. These may include: Caffeinated coffee, tea, soft drinks, and energy drinks. Chocolate. Alcohol. Diet pills. Lifestyle     Take steps to reduce your stress and anxiety. Things that can help you relax include: Yoga. Mind-body activities, such as deep breathing, meditation, or using words and images to create positive thoughts (guided imagery). Physical activity, such as swimming, jogging, or walking. Tell your health care provider if your palpitations increase with activity. If you have chest pain or shortness of breath with activity, do not continue the activity until you are seen by your health care provider. Biofeedback. This is a method that helps you learn to use your mind to control things in your body, such as your heartbeat. Get plenty of rest and sleep. Keep a regular bed time. Do not use drugs, including cocaine or ecstasy. Do not use marijuana. Do not use any products that contain nicotine or tobacco. These products include cigarettes, chewing  tobacco, and vaping devices, such as e-cigarettes. If you need help quitting, ask your health care provider. General instructions Take over-the-counter and prescription medicines only as told by your health care provider. Keep all follow-up visits. This is important. These may include visits for further testing if palpitations do not go away or get worse. Contact a health care provider if: You continue to have a fast or irregular heartbeat for a long period of time. You notice that your palpitations occur more often. Get help right away if: You have chest pain or shortness of breath. You have a severe headache. You feel dizzy or you faint. These symptoms may represent a serious problem that is an emergency. Do not wait to see if the symptoms will go away. Get medical help right away. Call your local emergency services (911 in the [place] Do not drive yourself to the hospital. Summary Palpitations are feelings that your heartbeat is irregular or is faster than normal. It may feel like your heart is fluttering or skipping a beat. Palpitations may be caused by many things, including smoking, caffeine, alcohol, stress, certain medicines, and drugs. Further tests and a thorough medical history may be done to find the cause of your palpitations. Get help right away if you faint or have chest pain, shortness of breath, severe headache, or dizziness. This information is not intended to replace advice given to you by your health care provider. Make sure you discuss any questions you have with your health care provider. Document Revised: 09/09/2020 Document Reviewed: 09/09/2020 Elsevier Patient Education  2024 [employer].

## 2023-02-23 DIAGNOSIS — H04123 Dry eye syndrome of bilateral lacrimal glands: Secondary | ICD-10-CM | POA: Diagnosis not present

## 2023-02-23 DIAGNOSIS — H1045 Other chronic allergic conjunctivitis: Secondary | ICD-10-CM | POA: Diagnosis not present

## 2023-02-24 ENCOUNTER — Other Ambulatory Visit (INDEPENDENT_AMBULATORY_CARE_PROVIDER_SITE_OTHER): Payer: Self-pay | Admitting: Physician Assistant

## 2023-02-24 DIAGNOSIS — E559 Vitamin D deficiency, unspecified: Secondary | ICD-10-CM

## 2023-02-26 ENCOUNTER — Other Ambulatory Visit (INDEPENDENT_AMBULATORY_CARE_PROVIDER_SITE_OTHER): Payer: Self-pay | Admitting: Physician Assistant

## 2023-02-26 DIAGNOSIS — F331 Major depressive disorder, recurrent, moderate: Secondary | ICD-10-CM

## 2023-02-28 ENCOUNTER — Encounter (INDEPENDENT_AMBULATORY_CARE_PROVIDER_SITE_OTHER): Payer: Self-pay | Admitting: Family Medicine

## 2023-02-28 ENCOUNTER — Telehealth: Payer: Self-pay | Admitting: Family

## 2023-02-28 ENCOUNTER — Ambulatory Visit (INDEPENDENT_AMBULATORY_CARE_PROVIDER_SITE_OTHER): Payer: 59 | Admitting: Family Medicine

## 2023-02-28 VITALS — BP 136/78 | HR 68 | Temp 98.4°F | Ht 63.0 in | Wt 216.0 lb

## 2023-02-28 DIAGNOSIS — E559 Vitamin D deficiency, unspecified: Secondary | ICD-10-CM

## 2023-02-28 DIAGNOSIS — E88819 Insulin resistance, unspecified: Secondary | ICD-10-CM | POA: Diagnosis not present

## 2023-02-28 DIAGNOSIS — Z6837 Body mass index (BMI) 37.0-37.9, adult: Secondary | ICD-10-CM

## 2023-02-28 DIAGNOSIS — F331 Major depressive disorder, recurrent, moderate: Secondary | ICD-10-CM

## 2023-02-28 DIAGNOSIS — F5089 Other specified eating disorder: Secondary | ICD-10-CM

## 2023-02-28 DIAGNOSIS — E7849 Other hyperlipidemia: Secondary | ICD-10-CM | POA: Diagnosis not present

## 2023-02-28 DIAGNOSIS — K219 Gastro-esophageal reflux disease without esophagitis: Secondary | ICD-10-CM

## 2023-02-28 DIAGNOSIS — Z6838 Body mass index (BMI) 38.0-38.9, adult: Secondary | ICD-10-CM

## 2023-02-28 DIAGNOSIS — E669 Obesity, unspecified: Secondary | ICD-10-CM

## 2023-02-28 DIAGNOSIS — D509 Iron deficiency anemia, unspecified: Secondary | ICD-10-CM

## 2023-02-28 MED ORDER — BUPROPION HCL ER (SR) 150 MG PO TB12
150.0000 mg | ORAL_TABLET | Freq: Every day | ORAL | 0 refills | Status: DC
Start: 2023-02-28 — End: 2023-03-28

## 2023-02-28 MED ORDER — TRAZODONE HCL 50 MG PO TABS: 25.0000 mg | ORAL_TABLET | Freq: Every evening | ORAL | 0 refills | Status: DC | PRN

## 2023-02-28 MED ORDER — METFORMIN HCL 500 MG PO TABS: ORAL_TABLET | ORAL | Status: DC

## 2023-02-28 MED ORDER — TOPIRAMATE 50 MG PO TABS
50.0000 mg | ORAL_TABLET | Freq: Two times a day (BID) | ORAL | 0 refills | Status: DC
Start: 2023-02-28 — End: 2023-03-28

## 2023-02-28 MED ORDER — VITAMIN D (ERGOCALCIFEROL) 1.25 MG (50000 UNIT) PO CAPS: 50000.0000 [IU] | ORAL_CAPSULE | ORAL | 0 refills | Status: DC

## 2023-02-28 NOTE — Progress Notes (Signed)
[name], D.O.  ABFM, ABOM Specializing in Clinical Bariatric Medicine  Office located at: [address] [place], [place]  [address]     Assessment and Plan:   Medications Discontinued During This Encounter  Medication Reason   topiramate (TOPAMAX) 50 MG tablet Reorder   Vitamin D, Ergocalciferol, (DRISDOL) 1.25 MG (50000 UNIT) CAPS capsule Reorder   buPROPion (WELLBUTRIN SR) 150 MG 12 hr tablet Reorder   metFORMIN (GLUCOPHAGE) 500 MG tablet     Meds ordered this encounter  Medications   topiramate (TOPAMAX) 50 MG tablet    Sig: Take 1 tablet (50 mg total) by mouth 2 (two) times daily.    Dispense:  60 tablet    Refill:  0   Vitamin D, Ergocalciferol, (DRISDOL) 1.25 MG (50000 UNIT) CAPS capsule    Sig: Take 1 capsule (50,000 Units total) by mouth every 7 (seven) days.    Dispense:  4 capsule    Refill:  0   buPROPion (WELLBUTRIN SR) 150 MG 12 hr tablet    Sig: Take 1 tablet (150 mg total) by mouth daily.    Dispense:  30 tablet    Refill:  0   traZODone (DESYREL) 50 MG tablet    Sig: Take 0.5-1 tablets (25-50 mg total) by mouth at bedtime as needed for sleep.    Dispense:  30 tablet    Refill:  0   metFORMIN (GLUCOPHAGE) 500 MG tablet    Sig: TAKE 1 TABLETS BY MOUTH TWICE DAILY WITH MEALS    Insulin resistance Assessment: Condition is Not optimized.. Pt has since then restarted Metformin and only experienced significant nausea but treated this with Zofran. She endorses having no hunger or cravings due to anxiety and depression. Pt A1c has reminded the same at 5.3 and her insulin increased from 10.0 to 12.3 as of 01/31/2023. Her CMP and thyroid levels are within normal range.  Lab Results  Component Value Date   HGBA1C 5.3 01/31/2023   HGBA1C 5.3 05/10/2022   HGBA1C 5.2 11/11/2021   INSULIN 12.3 01/31/2023   INSULIN 10.0 05/10/2022   INSULIN 12.5 11/11/2021   Lab Results  Component Value Date   CREATININE 0.77 01/31/2023   BUN 9 01/31/2023   NA 140  01/31/2023   K 4.7 01/31/2023   CL 104 01/31/2023   CO2 22 01/31/2023      Component Value Date/Time   PROT 7.0 01/31/2023 0923   ALBUMIN 4.2 01/31/2023 0923   AST 11 01/31/2023 0923   ALT 11 01/31/2023 0923   ALKPHOS 111 01/31/2023 0923   BILITOT 0.4 01/31/2023 0923   BILIDIR 0.1 10/04/2012 1238   IBILI 0.2 10/04/2012 1238   Lab Results  Component Value Date   TSH 1.230 01/31/2023   T3TOTAL 115 02/20/2020   T4TOTAL 8.5 02/20/2020   Plan: - Decrease her Metformin to 1 tablet BID if nausea continues decrease to half a tablet BID. I will refill today.    - She will continue to focus on protein-rich, low simple carbohydrate foods. We reviewed the importance of hydration, regular exercise for stress reduction, and restorative sleep.   - Anticipatory guidance given.    - Brittney Farmer will continue to work on weight loss, exercise, via their meal plan we devised to help decrease the risk of progressing to diabetes.   Labs were reviewed with patient today and education provided on them. We discussed how the foods patient eats may influence these laboratory findings.  All of the patient's  questions about them were answered    Vitamin D deficiency Assessment: Condition is Not at goal..  Pt vitamin D levels has significantly improved from 27.7 to 45.4 as of 01/31/2023. Pt B-12 level is normal. This is improved with Ergocalciferol once weekly. She denies any adverse side effects.  Lab Results  Component Value Date   VD25OH 45.4 01/31/2023   VD25OH 27.7 (L) 05/10/2022   VD25OH 41.2 11/11/2021   Lab Results  Component Value Date   VITAMINB12 801 01/31/2023   Plan: - Continue with Ergocalciferol 50K IU weekly. I will refill today.   - weight loss will likely improve availability of vitamin D, thus encouraged [name] to continue with meal plan and their weight loss efforts to further improve this condition.  With winter approaching her vitamin D levels are prone to drop so we will need to  monitor levels regularly (every 3-4 mo on average) to keep levels within normal limits and prevent over supplementation.  Labs were reviewed with patient today and education provided on them. We discussed how the foods patient eats may influence these laboratory findings.  All of the patient's questions about them were answered    Other hyperlipidemia Assessment: Condition is Not optimized.. This is diet/exercise controlled. Pt LDL level is elevated at 165 as of 01/31/2023.  Lab Results  Component Value Date   CHOL 224 (H) 01/31/2023   HDL 46 01/31/2023   LDLCALC 165 (H) 01/31/2023   TRIG 72 01/31/2023   CHOLHDL 3.4 07/12/2022   Plan: - Ace Hocutt agrees to continue with meds and/or our treatment plan of a heart-heathy plan that is low in saturated and trans fats, and low in fatty carbs.  - We recommend: aerobic activity with eventual goal of a minimum of 150+ min wk plus 2 days/ week of resistance or strength training.    Labs were reviewed with patient today and education provided on them. We discussed how the foods patient eats may influence these laboratory findings.  All of the patient's questions about them were answered     Iron deficiency anemia, unspecified iron deficiency anemia type Assessment: Condition is Not optimized.. This is diet/exercise controlled. Pt has not take iron supplements since being diagnosed with iron deficiency anemia. Her iron, TIBC, Ferritin, and CBC are all within normal range however, her iron saturation level is very low at 9%.  Lab Results  Component Value Date   WBC 7.0 01/31/2023   HGB 14.1 01/31/2023   HCT 41.9 01/31/2023   MCV 88 01/31/2023   PLT 316 01/31/2023      Component Value Date/Time   IRON 29 01/31/2023 0923   TIBC 307 01/31/2023 0923   FERRITIN 97 01/31/2023 0923   IRONPCTSAT 9 (LL) 01/31/2023 0923   Plan: - Begin OTC supplements for her iron deficiency.   - Eat foods rich in iron such as kale and spinach.   Labs were  reviewed with patient today and education provided on them. We discussed how the foods patient eats may influence these laboratory findings.  All of the patient's questions about them were answered    Other disorder of eating-emotional eating Assessment: Condition is Improving, but not optimized.. Patient reports good compliance and tolerance of taking Topiramate. She notes the only adverse side effects is her fingers tingling which is temporary and tolerable. She feels as this helps with her migraines and cravings.   Plan: - Begin Topiramate 50mg  half a tablet BID.   - Reminded patient of the importance  of following their prudent nutrition plan and how food can affect mood as well to support emotional wellbeing.    Moderate episode of recurrent major depressive disorder (HCC) Assessment: Condition is Improving, but not optimized.. Pt is on Wellbutrin and was previously placed on Buspar, Lexapro and other medications in the past that did not work as well. She feels as she has less anxiety, depression, and cravings, and is more motivation to get out of bed to do things while on Wellbutrin. However, she has noticed that this disrupts her sleep and she cannot go to sleep throughout the night. She has tried to melatonin to no avail.   Risk and benefits discussed with pt discussed stopping med and try another pt declined and would prefer a sleep aid.   Plan: - Risks and benefits were discussed with pt on stopping current meds and starting another. Pt declined stopping Wellbutrin and would prefer a sleep aid.   - Continue Wellbutrin as directed. I will refill today.   - Begin Trazodone  50mg  once daily before bedtime  - We discussed sleep hygiene with pt such as going to sleep at the same time every night and sleeping in a dark cold room.   - Behavior modification techniques were discussed today to help deal with emotional/ non-hunger eating behaviors including but not limited to exercise for  stress management, meditation/prayer, behavorial sessions with her therapist and self care activities.   TREATMENT PLAN FOR OBESITY: BMI 37.0-37.9, adult Current BMI 38.27 Obesity Start BMI 41.63 Date 02/20/20 Assessment:  Brittney Farmer is here to discuss her progress with her obesity treatment plan along with follow-up of her obesity related diagnoses. See Medical Weight Management Flowsheet for complete bioelectrical impedance results.  Condition is improving but not optimized. Biometric data collected today, was reviewed with patient.   Since last office visit on 01/31/2023 patient's  Muscle mass has decreased by 3.6lb. Fat mass has decreased by 5.6lb. Total body water has decreased by 0.2lb.  Counseling done on how various foods will affect these numbers and how to maximize success  Total lbs lost to date: 19 Total weight loss percentage to date: 8.09%   Plan:  Zoann is currently in the action stage of change and will follow the  Category 2 meal plan with b/l options with 6 ounces of lean protein at lunch and 8 ounces at dinner.   Behavioral Intervention Additional resources provided today: Provided hand out on proper plate portions, healthy complex carbs and sources of protein as well as generaly healthy eating concepts.  Evidence-based interventions for health behavior change were utilized today including the discussion of self monitoring techniques, problem-solving barriers and SMART goal setting techniques.   Regarding patient's less desirable eating habits and patterns, we employed the technique of small changes.  Pt will specifically work on: Work on focusing on herself and mental health and to not skip meals for next visit.     She has agreed to Continue current level of physical activity  and Think about enjoyable ways to increase daily physical activity and overcoming barriers to exercise   FOLLOW UP: Return in about 7 weeks (around 04/20/2023).  She was informed of the  importance of frequent follow up visits to maximize her success with intensive lifestyle modifications for her multiple health conditions.  Subjective:   Chief complaint: Obesity Artemisa is here to discuss her progress with her obesity treatment plan. She is on the the Category 2 Plan with breakfast and lunch options with  6oz of lean protein at lunch and 8oz at dinner and states she is following her eating plan approximately 0% of the time. She states she is walking 60 minutes 2-3 days per week.  Interval History:  Anaiza Azbill is here for a follow up office visit.     Since last office visit:  Pt informed me that she has been eating once meal a day. She hasn't felt in the mood to eat and seems to have some stressors going on. She notes not following the meal plan until she gets her anxiety and depression under control. Pt informed me that she began experiencing chest pain and went to her PCP for further evaluation. She had a chest x-ray done that revealed to be normal. Pt was placed on Pindolol and this helped regulate her heart rate as this was elevated at rest previously.   We reviewed her meal plan and all questions were answered.   Pharmacotherapy for weight loss: She is currently taking Topamax, Wellbutrin, and Metformin  for medical weight loss.  Denies side effects.    Review of Systems:  Pertinent positives were addressed with patient today.  Reviewed by clinician on day of visit: allergies, medications, problem list, medical history, surgical history, family history, social history, and previous encounter notes.  Weight Summary and Biometrics   Weight Lost Since Last Visit: 9lb  Weight Gained Since Last Visit: 0lb   Vitals Temp: 98.4 F (36.9 C) BP: 136/78 Pulse Rate: 68 SpO2: 98 %   Anthropometric Measurements Height: 5\' 3"  (1.6 m) Weight: 216 lb (98 kg) BMI (Calculated): 38.27 Weight at Last Visit: 225lb Weight Lost Since Last Visit: 9lb Weight Gained Since Last  Visit: 0lb Starting Weight: 235lb Total Weight Loss (lbs): 19 lb (8.618 kg) Peak Weight: 256lb   Body Composition  Body Fat %: 44.4 % Fat Mass (lbs): 96 lbs Muscle Mass (lbs): 114.4 lbs Total Body Water (lbs): 82 lbs Visceral Fat Rating : 11   Other Clinical Data Fasting: no Labs: no Today's Visit #: 34 Starting Date: 02/20/20     Objective:   PHYSICAL EXAM: Blood pressure 136/78, pulse 68, temperature 98.4 F (36.9 C), height 5\' 3"  (1.6 m), weight 216 lb (98 kg), SpO2 98%. Body mass index is 38.26 kg/m.  General: Well Developed, well nourished, and in no acute distress.  HEENT: Normocephalic, atraumatic Skin: Warm and dry, cap RF less 2 sec, good turgor Chest:  Normal excursion, shape, no gross abn Respiratory: speaking in full sentences, no conversational dyspnea NeuroM-Sk: Ambulates w/o assistance, moves * 4 Psych: A and O *3, insight good, mood-full  DIAGNOSTIC DATA REVIEWED:  BMET    Component Value Date/Time   NA 140 01/31/2023 0923   K 4.7 01/31/2023 0923   CL 104 01/31/2023 0923   CO2 22 01/31/2023 0923   GLUCOSE 94 01/31/2023 0923   GLUCOSE 75 10/04/2012 1238   BUN 9 01/31/2023 0923   CREATININE 0.77 01/31/2023 0923   CREATININE 0.59 10/04/2012 1238   CALCIUM 9.2 01/31/2023 0923   GFRNONAA 111 02/20/2020 1012   GFRNONAA >89 10/04/2012 1238   GFRAA 128 02/20/2020 1012   GFRAA >89 10/04/2012 1238   Lab Results  Component Value Date   HGBA1C 5.3 01/31/2023   HGBA1C 5.1 02/20/2020   Lab Results  Component Value Date   INSULIN 12.3 01/31/2023   INSULIN 5.2 02/20/2020   Lab Results  Component Value Date   TSH 1.230 01/31/2023   CBC    Component Value  Date/Time   WBC 7.0 01/31/2023 0923   WBC 7.8 10/18/2021 0820   RBC 4.79 01/31/2023 0923   RBC 4.52 10/18/2021 0820   HGB 14.1 01/31/2023 0923   HCT 41.9 01/31/2023 0923   PLT 316 01/31/2023 0923   MCV 88 01/31/2023 0923   MCH 29.4 01/31/2023 0923   MCH 29.2 10/18/2021 0820   MCHC  33.7 01/31/2023 0923   MCHC 32.7 10/18/2021 0820   RDW 12.5 01/31/2023 0923   Iron Studies    Component Value Date/Time   IRON 29 01/31/2023 0923   TIBC 307 01/31/2023 0923   FERRITIN 97 01/31/2023 0923   IRONPCTSAT 9 (LL) 01/31/2023 0923   Lipid Panel     Component Value Date/Time   CHOL 224 (H) 01/31/2023 0923   TRIG 72 01/31/2023 0923   HDL 46 01/31/2023 0923   CHOLHDL 3.4 07/12/2022 0912   LDLCALC 165 (H) 01/31/2023 0923   Hepatic Function Panel     Component Value Date/Time   PROT 7.0 01/31/2023 0923   ALBUMIN 4.2 01/31/2023 0923   AST 11 01/31/2023 0923   ALT 11 01/31/2023 0923   ALKPHOS 111 01/31/2023 0923   BILITOT 0.4 01/31/2023 0923   BILIDIR 0.1 10/04/2012 1238   IBILI 0.2 10/04/2012 1238      Component Value Date/Time   TSH 1.230 01/31/2023 0923   Nutritional Lab Results  Component Value Date   VD25OH 45.4 01/31/2023   VD25OH 27.7 (L) 05/10/2022   VD25OH 41.2 11/11/2021    Attestations:   This encounter took 44 total minutes of time including any pre-visit and post-visit time spent on this date of service, including taking a thorough history, reviewing any labs and/or imaging, reviewing prior notes, as well as documenting in the electronic health record on the date of service. Over 50% of that time was in direct face-to-face counseling and coordinating care for the patient today  I, Clinical biochemist, acting as a Stage manager for [employer], DO., have compiled all relevant documentation for today's office visit on behalf of Thomasene Lot, DO, while in the presence of [employer], DO.  I have reviewed the above documentation for accuracy and completeness, and I agree with the above. [name], D.O.  The 21st Century Cures Act was signed into law in 2016 which includes the topic of electronic health records.  This provides immediate access to information in MyChart.  This includes consultation notes, operative notes, office notes, lab  results and pathology reports.  If you have any questions about what you read please let us know at your next visit so we can discuss your concerns and take corrective action if need be.  We are right here with you.

## 2023-02-28 NOTE — Telephone Encounter (Signed)
Name from pharmacy: OMEPRAZOLE DR 40 MG CAPSULE  Pharmacy comment: Alternative Requested:NEEDS PRIOR AUTHORIZATION- INSURANCE WILL ONLY COVER 90 CAPSULES EVERY 365 DAYS.

## 2023-03-01 ENCOUNTER — Other Ambulatory Visit (HOSPITAL_COMMUNITY): Payer: Self-pay

## 2023-03-01 ENCOUNTER — Telehealth: Payer: Self-pay

## 2023-03-01 MED ORDER — OMEPRAZOLE 40 MG PO CPDR: 40.0000 mg | DELAYED_RELEASE_CAPSULE | Freq: Every day | ORAL | 3 refills | Status: DC

## 2023-03-01 NOTE — Telephone Encounter (Signed)
30 day supply with 3 refills sent to pts pharmacy.

## 2023-03-01 NOTE — Telephone Encounter (Signed)
Pharmacy Patient Advocate Encounter   Received notification from Pt Calls Messages that prior authorization for Omeprazole 40MG  dr capsules is required/requested.   Insurance verification completed.   The patient is insured through [facility] .   Per test claim: CANCELLED due to plan covers max 30 day supply per fill. No PA required. Called insurance. Per insurance, max 90 capsules in 365 days. After filling 90 capsules may initiate prior authorization.   Key: [id number]

## 2023-03-01 NOTE — Addendum Note (Signed)
Addended by: Dorene Sorrow on: 03/01/2023 10:37 AM   Modules accepted: Orders

## 2023-03-01 NOTE — Telephone Encounter (Signed)
PA request has been Cancelled. New Encounter created for follow up. For additional info see Pharmacy Prior Auth telephone encounter from 03/01/23.    Called insurance. Insurance only covers max 30 day supply at a time. No PA required.

## 2023-03-06 DIAGNOSIS — G4700 Insomnia, unspecified: Secondary | ICD-10-CM | POA: Diagnosis not present

## 2023-03-06 DIAGNOSIS — M1990 Unspecified osteoarthritis, unspecified site: Secondary | ICD-10-CM | POA: Diagnosis not present

## 2023-03-06 DIAGNOSIS — E785 Hyperlipidemia, unspecified: Secondary | ICD-10-CM | POA: Diagnosis not present

## 2023-03-06 DIAGNOSIS — J309 Allergic rhinitis, unspecified: Secondary | ICD-10-CM | POA: Diagnosis not present

## 2023-03-06 DIAGNOSIS — M5440 Lumbago with sciatica, unspecified side: Secondary | ICD-10-CM | POA: Diagnosis not present

## 2023-03-06 DIAGNOSIS — K589 Irritable bowel syndrome without diarrhea: Secondary | ICD-10-CM | POA: Diagnosis not present

## 2023-03-06 DIAGNOSIS — Z8616 Personal history of COVID-19: Secondary | ICD-10-CM | POA: Diagnosis not present

## 2023-03-06 DIAGNOSIS — Z7984 Long term (current) use of oral hypoglycemic drugs: Secondary | ICD-10-CM | POA: Diagnosis not present

## 2023-03-06 DIAGNOSIS — K219 Gastro-esophageal reflux disease without esophagitis: Secondary | ICD-10-CM | POA: Diagnosis not present

## 2023-03-06 DIAGNOSIS — E559 Vitamin D deficiency, unspecified: Secondary | ICD-10-CM | POA: Diagnosis not present

## 2023-03-06 DIAGNOSIS — F411 Generalized anxiety disorder: Secondary | ICD-10-CM | POA: Diagnosis not present

## 2023-03-22 ENCOUNTER — Other Ambulatory Visit (INDEPENDENT_AMBULATORY_CARE_PROVIDER_SITE_OTHER): Payer: Self-pay | Admitting: Family Medicine

## 2023-03-22 DIAGNOSIS — F331 Major depressive disorder, recurrent, moderate: Secondary | ICD-10-CM

## 2023-03-28 ENCOUNTER — Telehealth: Payer: Self-pay | Admitting: Family Medicine

## 2023-03-28 ENCOUNTER — Encounter (INDEPENDENT_AMBULATORY_CARE_PROVIDER_SITE_OTHER): Payer: Self-pay | Admitting: Physician Assistant

## 2023-03-28 ENCOUNTER — Ambulatory Visit (INDEPENDENT_AMBULATORY_CARE_PROVIDER_SITE_OTHER): Payer: 59 | Admitting: Physician Assistant

## 2023-03-28 VITALS — BP 111/65 | HR 76 | Temp 98.1°F | Ht 63.0 in | Wt 214.0 lb

## 2023-03-28 DIAGNOSIS — F5089 Other specified eating disorder: Secondary | ICD-10-CM | POA: Diagnosis not present

## 2023-03-28 DIAGNOSIS — E669 Obesity, unspecified: Secondary | ICD-10-CM | POA: Diagnosis not present

## 2023-03-28 DIAGNOSIS — E559 Vitamin D deficiency, unspecified: Secondary | ICD-10-CM

## 2023-03-28 DIAGNOSIS — E88819 Insulin resistance, unspecified: Secondary | ICD-10-CM

## 2023-03-28 DIAGNOSIS — Z6837 Body mass index (BMI) 37.0-37.9, adult: Secondary | ICD-10-CM

## 2023-03-28 DIAGNOSIS — R110 Nausea: Secondary | ICD-10-CM

## 2023-03-28 DIAGNOSIS — F331 Major depressive disorder, recurrent, moderate: Secondary | ICD-10-CM

## 2023-03-28 DIAGNOSIS — Z6838 Body mass index (BMI) 38.0-38.9, adult: Secondary | ICD-10-CM

## 2023-03-28 MED ORDER — METFORMIN HCL 500 MG PO TABS: ORAL_TABLET | ORAL | 0 refills | Status: DC

## 2023-03-28 MED ORDER — ONDANSETRON HCL 4 MG PO TABS: 4.0000 mg | ORAL_TABLET | Freq: Three times a day (TID) | ORAL | 2 refills | Status: DC | PRN

## 2023-03-28 MED ORDER — TRAZODONE HCL 50 MG PO TABS: 50.0000 mg | ORAL_TABLET | Freq: Every evening | ORAL | 0 refills | Status: DC | PRN

## 2023-03-28 MED ORDER — TOPIRAMATE 50 MG PO TABS: 50.0000 mg | ORAL_TABLET | Freq: Two times a day (BID) | ORAL | 0 refills | Status: DC

## 2023-03-28 MED ORDER — BUPROPION HCL ER (SR) 150 MG PO TB12: 150.0000 mg | ORAL_TABLET | Freq: Every day | ORAL | 0 refills | Status: DC

## 2023-03-28 MED ORDER — VITAMIN D (ERGOCALCIFEROL) 1.25 MG (50000 UNIT) PO CAPS: 50000.0000 [IU] | ORAL_CAPSULE | ORAL | 0 refills | Status: DC

## 2023-03-28 NOTE — Telephone Encounter (Signed)
Appt made

## 2023-03-28 NOTE — Progress Notes (Signed)
SUBJECTIVE:  Chief Complaint: Obesity  Interim History: Brittney Farmer, a 37 year old with a history of obesity, insulin resistance, vitamin D deficiency, and emotional eating behaviors, presents for a follow-up visit.  She has been struggling with increased anxiety and insomnia over the past couple of months.    She reports no improvement in sleep despite an increase in her sleep medication dosage, and she continues to wake up around 3-3:30 AM. She also expresses concern that her current anxiety medication is not effective.  Previously, the patient was on three different anxiety medications, including Wellbutrin and Lexapro, and she is considering returning to her primary care provider to discuss resuming this regimen. She also mentions a friend's recommendation of Seroquel for anxiety.   The patient also reports persistent morning nausea, which she initially attributed to metformin. However, she now suspects that other medications (Wellbutrin, topiramate, and pindolol) may be contributing to this symptom. Despite the nausea, the patient prefers to continue metformin, as she believes it helps control her eating behaviors and prevents overeating.   She expresses a preference for focusing on managing her anxiety first at this time and we discussed following up with her PCP- Brittney Rodney, FNP as she has had good results with previous treatment regimen under her care in the past.   Despite these challenges, the patient has had weight loss of 21 pounds and has been adhering to her medication regimen, which includes vitamin D supplementation. She also reports no muscle weakness or vomiting associated with the vitamin D.  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 She also reports family-related stressors. Despite these stressors, the patient is planning to cook a Thanksgiving meal for her immediate family. She expresses  a desire to maintain control over her eating during this period, with a plan to allow herself a piece of pecan pie as a treat.  Brittney Farmer is here to discuss her progress with her obesity treatment plan. She is on the Category 2 Plan and states she is following her eating plan approximately 50 % of the time. She states she is exercising walking  60 minutes 2-3 times per week.   OBJECTIVE: Visit Diagnoses: Problem List Items Addressed This Visit     Obesity Start BMI 41.63 Date 02/20/20   Relevant Medications   metFORMIN (GLUCOPHAGE) 500 MG tablet   Gastroesophageal reflux disease without esophagitis   Relevant Medications   ondansetron (ZOFRAN) 4 MG tablet   Vitamin D deficiency   Relevant Medications   Vitamin D, Ergocalciferol, (DRISDOL) 1.25 MG (50000 UNIT) CAPS capsule   Depression   Relevant Medications   traZODone (DESYREL) 50 MG tablet   buPROPion (WELLBUTRIN SR) 150 MG 12 hr tablet   Insulin resistance - Primary   Relevant Medications   metFORMIN (GLUCOPHAGE) 500 MG tablet   Eating disorder   Relevant Medications   topiramate (TOPAMAX) 50 MG tablet   BMI 38.0-38.9,adult Current BMI 38.8   Nausea and vomiting   Relevant Medications   ondansetron (ZOFRAN) 4 MG tablet  Obesity Total weight loss of 21 lbs TBW loss of 8.9% Following up on obesity treatment. Insulin resistance, vitamin D deficiency, and emotional eating behaviors noted. Despite stress and anxiety, lost 21 pounds. Discussed Thanksgiving strategies: portion control and post-meal walking to manage blood sugar. Prefers metformin for appetite control and preventing overeating. - Continue metformin 500 mg twice daily - Continue topiramate 50 mg twice daily - Provide guidance on portion  control and post-meal walking during Thanksgiving  Insulin Resistance Last fasting insulin was 12.3- not at goal. A1c was 5.3- at goal. Polyphagia:Yes but improved with metformin.  Medication(s): Metformin 500 mg twice daily with  meals Lab Results  Component Value Date   HGBA1C 5.3 01/31/2023   HGBA1C 5.3 05/10/2022   HGBA1C 5.2 11/11/2021   HGBA1C 5.1 02/20/2020   Lab Results  Component Value Date   INSULIN 12.3 01/31/2023   INSULIN 10.0 05/10/2022   INSULIN 12.5 11/11/2021   INSULIN 5.2 02/20/2020    Plan: Continue and refill Metformin 500 mg twice daily with meals Continue working on nutrition plan to decrease simple carbohydrates, increase lean proteins and exercise to promote weight loss, improve glycemic control and prevent progression to Type 2 diabetes.    Insomnia/ Depression Current sleep medication ineffective, waking at 3:00-3:30 AM. Increased doses without improvement. Discussed increasing trazodone dosage to up to 100 mg nightly for now as needed for sleep.  She plans to follow up with her PCP to address anxiety as previous treatment regimen was helpful to control anxiety.  - Increase/Refill trazodone to 50-100 mg - Reassess sleep quality at next visit  Anxiety/Depression:  Reports ongoing stress with family and work stressors.  Feels anxiety is her greatest concern currently.  Current Wellbutrin not effective. Better response to multiple anxiety medications previously prescribed by primary care provider- Brittney Rodney, FNP in past.  Plan: Discussed returning to primary care provider for further management. Discussed potential medication adjustments with primary care . Continue/refill    Nausea Nausea likely related to Wellbutrin and pindolol. Previously used Zofran effectively. Prefers to continue metformin despite nausea for appetite control and preventing overeating. EGD 07/27/20- Gastritis .  Colonoscopy 07/27/20- normal except for microscopic colitis. Taking Prilosec as directed per PCP.  Plans follow up with PCP over the next few weeks.  - Refill Zofran for nausea management prn   Vitamin D Deficiency Currently on vitamin D supplementation Ergocalciferol 50,000 units weekly. No  issues with muscle weakness, nausea, or vomiting or other side effects.  Last vitamin D level 45.4- not at goal. Plan: Refill and continue Ergocalciferol 50,000 units weekly.  Low vitamin D levels can be associated with adiposity and may result in leptin resistance and weight gain. Also associated with fatigue. Currently on vitamin D supplementation without any adverse effects.  Recheck vitamin d level several times yearly to optimize supplementation/ avoid over supplementation.  Follow-up - Follow-up appointment on April 24, 2023 - Encourage scheduling an appointment with primary care physician for anxiety management.   Mood disorder/emotional eating Brittney Farmer has had issues with stress/emotional eating. Currently this is moderately controlled. Overall mood is stable but still having anxiety concerns.  Medication(s): Bupropion SR 150 mg daily in am and topamax 50 mg BID- No side effects with medications.   Plan: Continue and refill Bupropion SR 150 mg daily in am Continue and refill topamax 50 mg BID Monitor response to medications and titrate accordingly.   Vitals Temp: 98.1 F (36.7 C) BP: 111/65 Pulse Rate: 76 SpO2: 100 %   Anthropometric Measurements Height: 5\' 3"  (1.6 m) Weight: 214 lb (97.1 kg) BMI (Calculated): 37.92 Weight at Last Visit: 216 lb Weight Lost Since Last Visit: 2 lb Weight Gained Since Last Visit: 0 Starting Weight: 235 lb Total Weight Loss (lbs): 21 lb (9.526 kg) Peak Weight: 256 lb   Body Composition  Body Fat %: 44.1 % Fat Mass (lbs): 94.6 lbs Muscle Mass (lbs): 114 lbs Total Body Water (  lbs): 81.4 lbs Visceral Fat Rating : 11   Other Clinical Data Fasting: yes Labs: no Today's Visit #: 35 Starting Date: 02/20/20     ASSESSMENT AND PLAN:  Diet: Shaylan is currently in the action stage of change. As such, her goal is to continue with weight loss efforts. She has agreed to Category 2 Plan.  Exercise: Joseph has been instructed  to work up to a goal of 150 minutes of combined cardio and strengthening exercise per week for weight loss and overall health benefits.   Behavior Modification:  We discussed the following Behavioral Modification Strategies today: increasing lean protein intake, decreasing simple carbohydrates, increasing vegetables, increase H2O intake, increase high fiber foods, meal planning and cooking strategies, emotional eating strategies , holiday eating strategies, and planning for success. We discussed various medication options to help Alejah with her weight loss efforts and we both agreed to continue metformin for insulin resistance, Wellbutrin and topamax for emotional eating/cravings.  Return in about 4 weeks (around 04/25/2023).Marland Kitchen She was informed of the importance of frequent follow up visits to maximize her success with intensive lifestyle modifications for her multiple health conditions.  Attestation Statements:   Reviewed by clinician on day of visit: allergies, medications, problem list, medical history, surgical history, family history, social history, and previous encounter notes.   Time spent on visit including pre-visit chart review and post-visit care and charting was 43 minutes.    Mccauley Diehl, PA-C

## 2023-03-28 NOTE — Telephone Encounter (Signed)
Copied from CRM [id number]. Topic: Clinical - Medication Question >> Mar 28, 2023  1:01 PM Donita Brooks wrote: Reason for CRM: Pt went to weight loss center and provider there sugguest she see Dr. Neysa Bonito to see if she can prescribe a higher dose of Anxiety meds. Pt doesn't know what do. If she should scdedule an appointment with Dr. Neysa Bonito or request a high dose of sleep medicine

## 2023-04-11 ENCOUNTER — Encounter: Payer: Self-pay | Admitting: Family

## 2023-04-11 ENCOUNTER — Ambulatory Visit (INDEPENDENT_AMBULATORY_CARE_PROVIDER_SITE_OTHER): Payer: 59 | Admitting: Family

## 2023-04-11 VITALS — BP 123/80 | HR 75 | Temp 97.7°F | Resp 20 | Ht 63.0 in | Wt 225.4 lb

## 2023-04-11 DIAGNOSIS — F411 Generalized anxiety disorder: Secondary | ICD-10-CM | POA: Diagnosis not present

## 2023-04-11 DIAGNOSIS — F331 Major depressive disorder, recurrent, moderate: Secondary | ICD-10-CM | POA: Diagnosis not present

## 2023-04-11 MED ORDER — ESCITALOPRAM OXALATE 10 MG PO TABS
10.0000 mg | ORAL_TABLET | Freq: Every day | ORAL | 3 refills | Status: DC
Start: 2023-04-11 — End: 2023-10-24

## 2023-04-11 MED ORDER — BUSPIRONE HCL 5 MG PO TABS: 5.0000 mg | ORAL_TABLET | Freq: Three times a day (TID) | ORAL | 1 refills | Status: DC | PRN

## 2023-04-11 NOTE — Patient Instructions (Signed)

## 2023-04-11 NOTE — Progress Notes (Signed)
Subjective:    Patient ID: Brittney Farmer, female    DOB: August 24, 1985, 37 y.o.   MRN: [id number]  Chief Complaint  Patient presents with   Insomnia    Sleeping meds and Anxiety meds not working. Wants Zoloft    Anxiety   Pt presents to the office today with complaints of insomnia and anxiety. Has been on Zoloft in the past and this helped. Wants to restart this.  Insomnia Primary symptoms: fragmented sleep, difficulty falling asleep, frequent awakening.   The current episode started more than one year. The problem occurs intermittently. Past treatments include medication. The treatment provided mild relief. PMH includes: depression.   Anxiety Presents for follow-up visit. Symptoms include depressed mood, excessive worry, insomnia, irritability, nervous/anxious behavior and restlessness. Symptoms occur occasionally. The severity of symptoms is mild.    Depression        This is a chronic problem.  The current episode started more than 1 year ago.   The problem occurs constantly.  Associated symptoms include fatigue, helplessness, hopelessness, insomnia, restlessness and sad.  Past medical history includes anxiety.       Review of Systems  Constitutional:  Positive for fatigue and irritability.  Psychiatric/Behavioral:  Positive for depression. The patient is nervous/anxious and has insomnia.   All other systems reviewed and are negative.      Objective:   Physical Exam Vitals reviewed.  Constitutional:      General: She is not in acute distress.    Appearance: She is well-developed. She is obese.  HENT:     Head: Normocephalic and atraumatic.     Right Ear: Tympanic membrane normal.     Left Ear: Tympanic membrane normal.  Eyes:     Pupils: Pupils are equal, round, and reactive to light.  Neck:     Thyroid: No thyromegaly.  Cardiovascular:     Rate and Rhythm: Normal rate and regular rhythm.     Heart sounds: Normal heart sounds. No murmur heard. Pulmonary:      Effort: Pulmonary effort is normal. No respiratory distress.     Breath sounds: Normal breath sounds. No wheezing.  Abdominal:     General: Bowel sounds are normal. There is no distension.     Palpations: Abdomen is soft.     Tenderness: There is no abdominal tenderness.  Musculoskeletal:        General: No tenderness. Normal range of motion.     Cervical back: Normal range of motion and neck supple.  Skin:    General: Skin is warm and dry.  Neurological:     Mental Status: She is alert and oriented to person, place, and time.     Cranial Nerves: No cranial nerve deficit.     Deep Tendon Reflexes: Reflexes are normal and symmetric.  Psychiatric:        Mood and Affect: Mood is anxious.        Behavior: Behavior normal.        Thought Content: Thought content normal.        Judgment: Judgment normal.    BP 123/80   Pulse 75   Temp 97.7 F (36.5 C) (Temporal)   Resp 20   Ht 5\' 3"  (1.6 m)   Wt 225 lb 6.4 oz (102.2 kg)   SpO2 99%   BMI 39.93 kg/m       Assessment & Plan:  Brittney Farmer comes in today with chief complaint of Insomnia (Sleeping meds and Anxiety meds not working.  Wants Zoloft ) and Anxiety   Diagnosis and orders addressed:  1. GAD (generalized anxiety disorder) - escitalopram (LEXAPRO) 10 MG tablet; Take 1 tablet (10 mg total) by mouth daily.  Dispense: 90 tablet; Refill: 3 - busPIRone (BUSPAR) 5 MG tablet; Take 1 tablet (5 mg total) by mouth 3 (three) times daily as needed.  Dispense: 90 tablet; Refill: 1  2. Obesity Start BMI 41.63 Date 02/20/20  3. Moderate episode of recurrent major depressive disorder (HCC) - escitalopram (LEXAPRO) 10 MG tablet; Take 1 tablet (10 mg total) by mouth daily.  Dispense: 90 tablet; Refill: 3   Will start Lexapro 10 mg daily Buspar 5 mg TID prn  Continue Wellbutrin and Trazodone  Health Maintenance reviewed Diet and exercise encouraged  Follow up plan: 1 month    Jannifer Rodney, FNP

## 2023-04-22 ENCOUNTER — Other Ambulatory Visit (INDEPENDENT_AMBULATORY_CARE_PROVIDER_SITE_OTHER): Payer: Self-pay | Admitting: Physician Assistant

## 2023-04-22 DIAGNOSIS — F331 Major depressive disorder, recurrent, moderate: Secondary | ICD-10-CM

## 2023-04-22 DIAGNOSIS — E88819 Insulin resistance, unspecified: Secondary | ICD-10-CM

## 2023-04-24 ENCOUNTER — Ambulatory Visit (INDEPENDENT_AMBULATORY_CARE_PROVIDER_SITE_OTHER): Payer: 59 | Admitting: Physician Assistant

## 2023-04-25 IMAGING — CT CT ABD-PELV W/ CM
2 of 4 series · 16 of 46 positions shown, 18 images · IV contrast (APPLIED)
Comparison: None Available.

CLINICAL DATA: Evaluate for ventral hernia

EXAM:
CT ABDOMEN AND PELVIS WITH CONTRAST
TECHNIQUE: Multidetector CT imaging of the abdomen and pelvis was performed
using the standard protocol following bolus administration of
intravenous contrast.

[Series 2: abd pel w · axial · 0.71mm/px · z∈[+1188,+1623]mm · 13 of 95 slices shown, 15 images]
[im 4/95  soft-tissue]
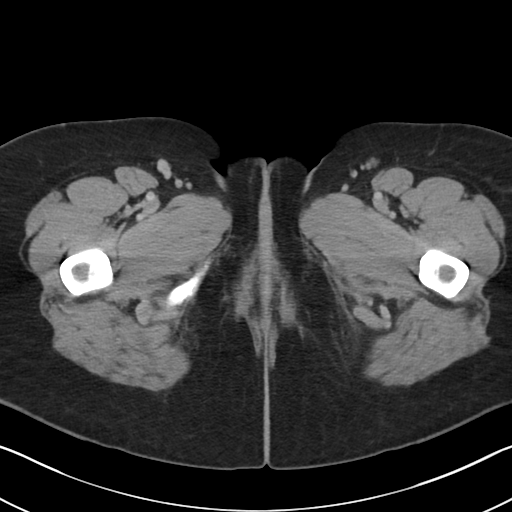
[im 4/95  bone]
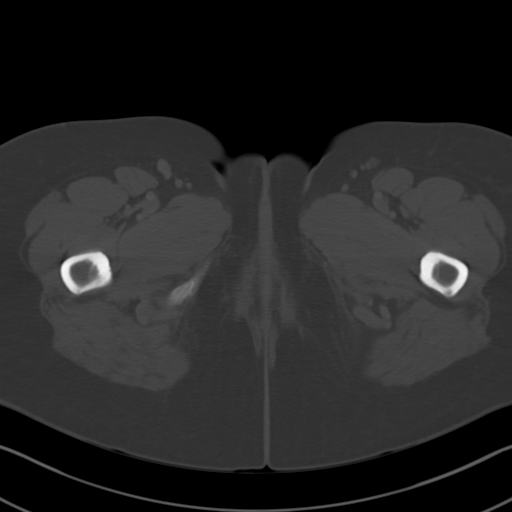
[im 12/95  soft-tissue]
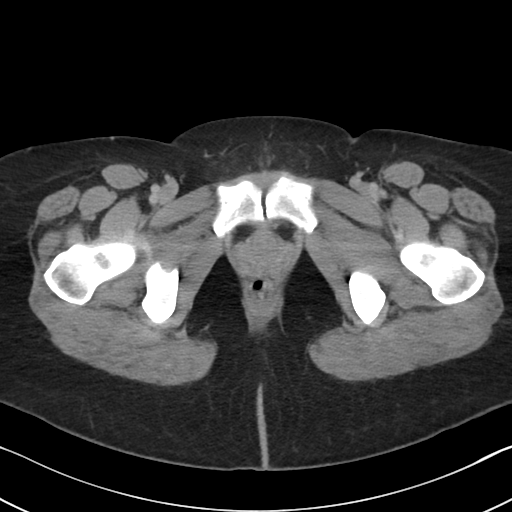
[im 20/95  soft-tissue]
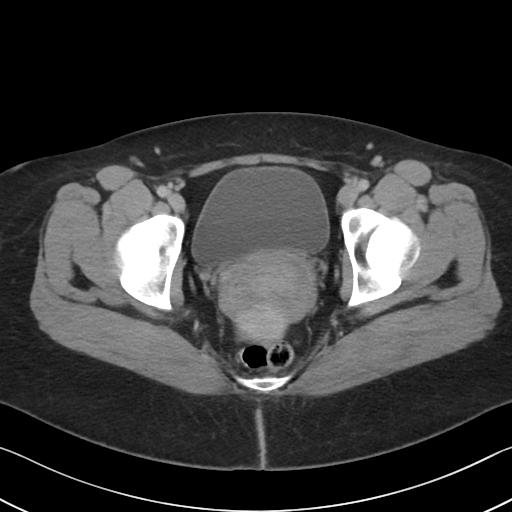
[im 28/95  soft-tissue]
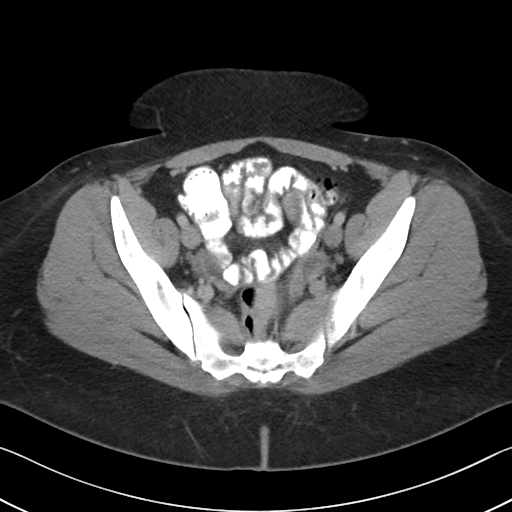
[im 32/95  soft-tissue]
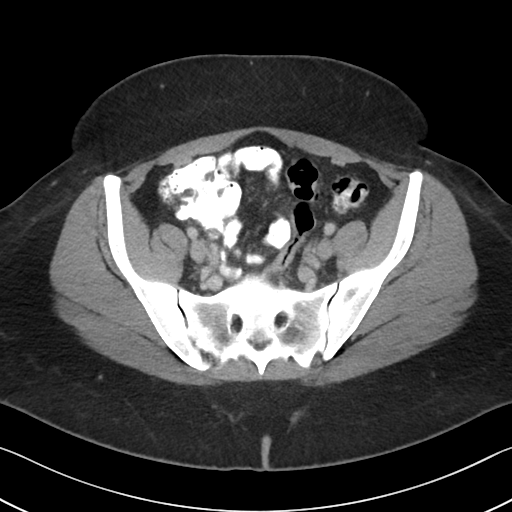
[im 40/95  soft-tissue]
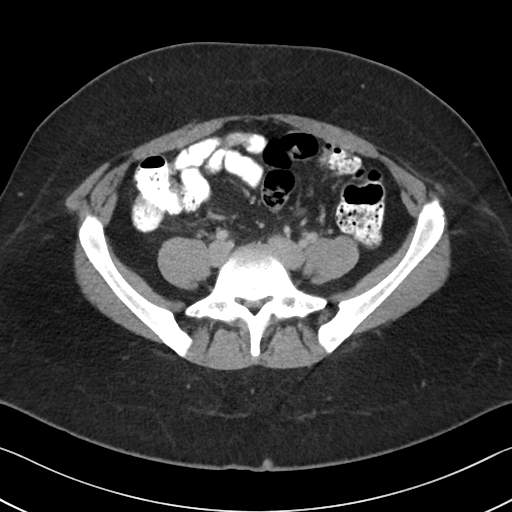
[im 48/95  soft-tissue]
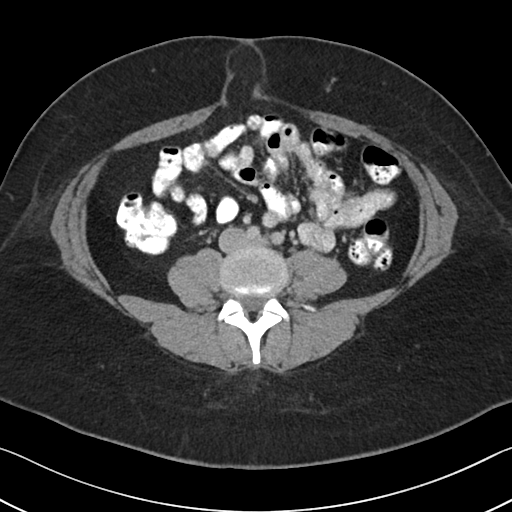
[im 55/95  soft-tissue]
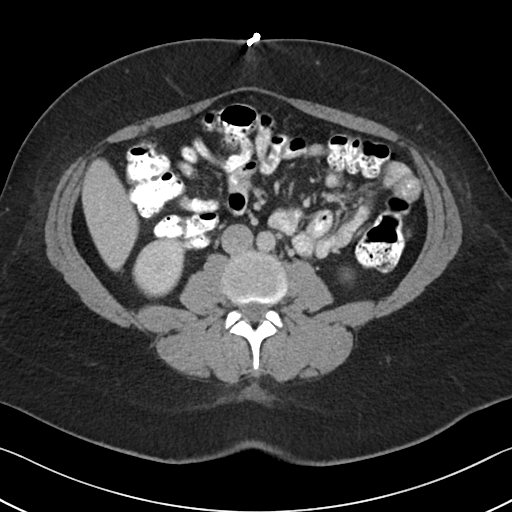
[im 63/95  soft-tissue]
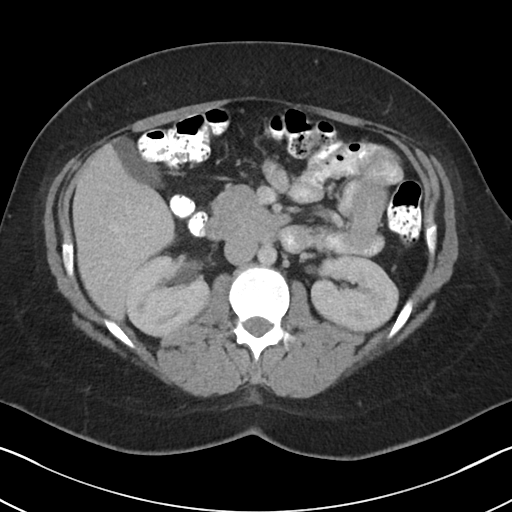
[im 63/95  bone]
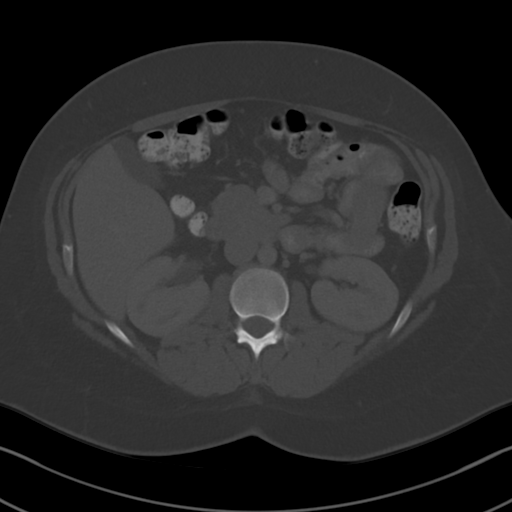
[im 67/95  soft-tissue]
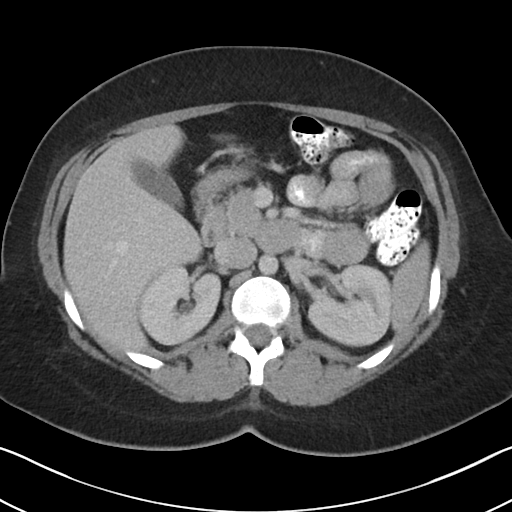
[im 75/95  soft-tissue]
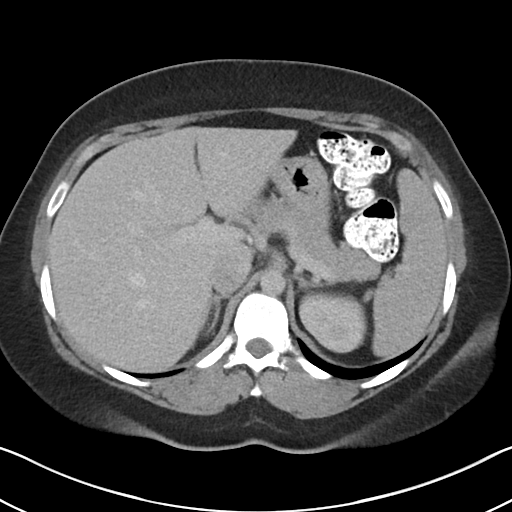
[im 83/95  soft-tissue]
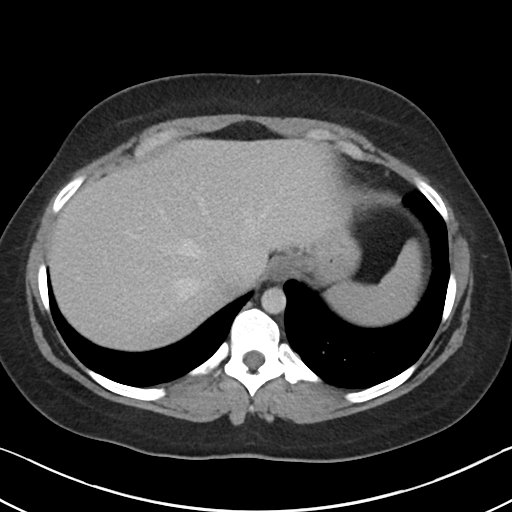
[im 91/95  soft-tissue]
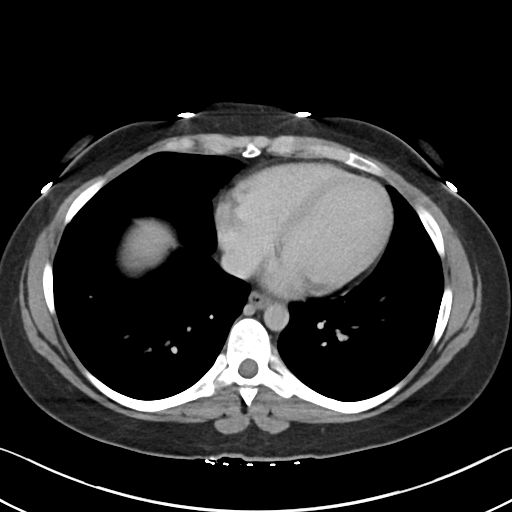

[Series 5: coronal · coronal · 0.88mm/px · 3 of 105 slices shown]
[im 35/105  soft-tissue]
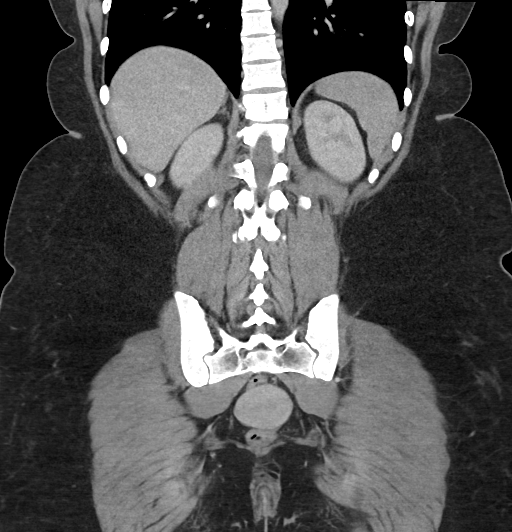
[im 47/105  soft-tissue]
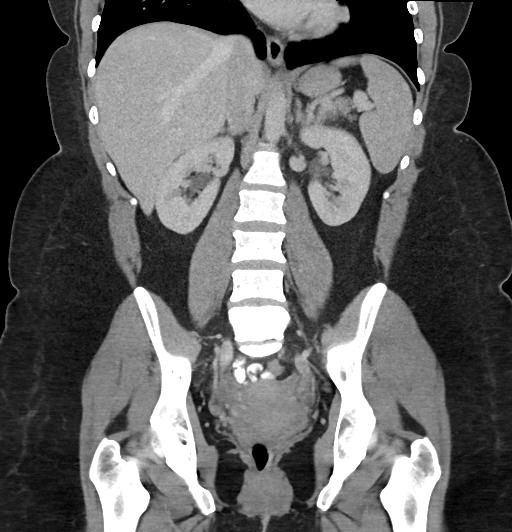
[im 58/105  soft-tissue]
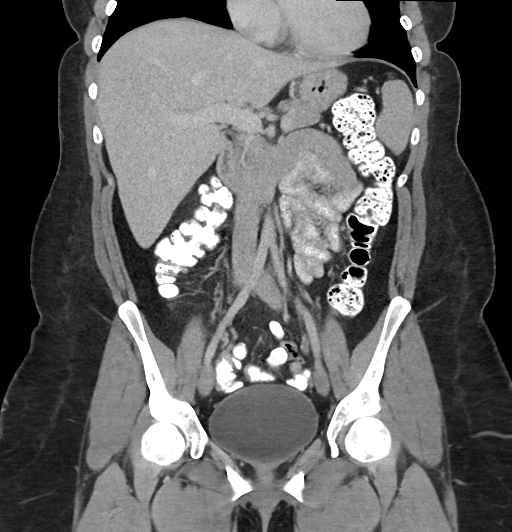

[16 of 46 positions shown; findings below may reference images not displayed]

RADIATION DOSE REDUCTION: This exam was performed according to the
departmental dose-optimization program which includes automated
exposure control, adjustment of the mA and/or kV according to
patient size and/or use of iterative reconstruction technique.

CONTRAST:  80mL OMNIPAQUE IOHEXOL 300 MG/ML  SOLN
FINDINGS: Lower chest: No acute abnormality.

Hepatobiliary: No focal liver abnormality is seen. No gallstones,
gallbladder wall thickening, or biliary dilatation.

Pancreas: Unremarkable. No pancreatic ductal dilatation or
surrounding inflammatory changes.

Spleen: Normal in size without focal abnormality.

Adrenals/Urinary Tract: Adrenal glands are within normal limits.
Kidneys demonstrate a normal enhancement pattern bilaterally. No
obstructive changes are seen. The bladder is well distended.

Stomach/Bowel: No obstructive or inflammatory changes of the colon
are seen. The appendix is not well visualized. No inflammatory
changes to suggest appendicitis are noted. Small bowel and stomach
are within normal limits.

Vascular/Lymphatic: No significant vascular findings are present. No
enlarged abdominal or pelvic lymph nodes.

Reproductive: Uterus and bilateral adnexa are unremarkable.

Other: No free fluid is noted. There is a fat containing
periumbilical hernia just to the right of the midline. The hernia
neck measures approximately 2.2 by 1.6 cm in greatest transverse and
craniocaudad projection.

Musculoskeletal: No acute or significant osseous findings.
IMPRESSION: Fat containing periumbilical hernia just to the right of the midline
as described.

No other focal abnormality is seen.

## 2023-04-26 ENCOUNTER — Other Ambulatory Visit (INDEPENDENT_AMBULATORY_CARE_PROVIDER_SITE_OTHER): Payer: Self-pay | Admitting: Physician Assistant

## 2023-04-26 DIAGNOSIS — E88819 Insulin resistance, unspecified: Secondary | ICD-10-CM

## 2023-05-04 ENCOUNTER — Other Ambulatory Visit: Payer: Self-pay | Admitting: Family

## 2023-05-04 ENCOUNTER — Other Ambulatory Visit (INDEPENDENT_AMBULATORY_CARE_PROVIDER_SITE_OTHER): Payer: Self-pay | Admitting: Physician Assistant

## 2023-05-04 DIAGNOSIS — F411 Generalized anxiety disorder: Secondary | ICD-10-CM

## 2023-05-04 DIAGNOSIS — F331 Major depressive disorder, recurrent, moderate: Secondary | ICD-10-CM

## 2023-05-09 ENCOUNTER — Other Ambulatory Visit: Payer: Self-pay | Admitting: Family

## 2023-05-09 DIAGNOSIS — K219 Gastro-esophageal reflux disease without esophagitis: Secondary | ICD-10-CM

## 2023-05-09 NOTE — Telephone Encounter (Signed)
 Name from pharmacy: [facility]MEPRAZOLE DR 40 MG CAPSULE  Pharmacy comment: Alternative Requested:PRIOR AUTH NEEDED.

## 2023-05-12 ENCOUNTER — Encounter: Payer: Self-pay | Admitting: Family

## 2023-05-12 ENCOUNTER — Telehealth (INDEPENDENT_AMBULATORY_CARE_PROVIDER_SITE_OTHER): Payer: 59 | Admitting: Family

## 2023-05-12 DIAGNOSIS — F5101 Primary insomnia: Secondary | ICD-10-CM

## 2023-05-12 DIAGNOSIS — F331 Major depressive disorder, recurrent, moderate: Secondary | ICD-10-CM | POA: Diagnosis not present

## 2023-05-12 DIAGNOSIS — F411 Generalized anxiety disorder: Secondary | ICD-10-CM

## 2023-05-12 MED ORDER — BELSOMRA 10 MG PO TABS
10.0000 mg | ORAL_TABLET | Freq: Every evening | ORAL | 2 refills | Status: DC | PRN
Start: 2023-05-12 — End: 2023-05-23

## 2023-05-12 NOTE — Progress Notes (Signed)
 Virtual Visit Consent   Brittney Farmer, you are scheduled for a virtual visit with a [place] provider today. Just as with appointments in the office, your consent must be obtained to participate. Your consent will be active for this visit and any virtual visit you may have with one of our providers in the next 365 days. If you have a MyChart account, a copy of this consent can be sent to you electronically.  As this is a virtual visit, video technology does not allow for your provider to perform a traditional examination. This may limit your provider's ability to fully assess your condition. If your provider identifies any concerns that need to be evaluated in person or the need to arrange testing (such as labs, EKG, etc.), we will make arrangements to do so. Although advances in technology are sophisticated, we cannot ensure that it will always work on either your end or our end. If the connection with a video visit is poor, the visit may have to be switched to a telephone visit. With either a video or telephone visit, we are not always able to ensure that we have a secure connection.  By engaging in this virtual visit, you consent to the provision of healthcare and authorize for your insurance to be billed (if applicable) for the services provided during this visit. Depending on your insurance coverage, you may receive a charge related to this service.  I need to obtain your verbal consent now. Are you willing to proceed with your visit today? Debe Anfinson has provided verbal consent on 05/12/2023 for a virtual visit (video or telephone). Bari Learn, FNP  Date: 05/12/2023 9:48 AM  Virtual Visit via Video Note   I, Bari Learn, connected with  Brittney Farmer  ([id number], [birth year]) on 05/12/23 at  9:25 AM EST by a video-enabled telemedicine application and verified that I am speaking with the correct person using two identifiers.  Location: Patient: Virtual Visit Location Patient: Other:  work Provider: Pharmacist, Community: Home Office   I discussed the limitations of evaluation and management by telemedicine and the availability of in person appointments. The patient expressed understanding and agreed to proceed.    History of Present Illness: Brittney Farmer is a 38 y.o. who identifies as a female who was assigned female at birth, and is being seen today for GAD and depression. She was seen on 04/11/23 and we added Lexapro  10 mg Buspar  5 mg TID prn. Reports she has been using the Buspar  5 mg BID. She has continued her Wellbutrin  150 mg BID.  She stopped the trazodone  because of a headache.   HPI: Anxiety Presents for follow-up visit. Symptoms include depressed mood, excessive worry, insomnia and nervous/anxious behavior. Symptoms occur rarely.    Depression        This is a chronic problem.  The current episode started more than 1 year ago.   The problem occurs intermittently.  Associated symptoms include insomnia.  Associated symptoms include no helplessness, no hopelessness and not sad.  Past treatments include SSRIs - Selective serotonin reuptake inhibitors.  Past medical history includes anxiety.   Insomnia Primary symptoms: sleep disturbance, difficulty falling asleep, frequent awakening.   Past treatments include medication. The treatment provided mild relief. PMH includes: depression.     Problems:  Patient Active Problem List   Diagnosis Date Noted   BMI 38.0-38.9,adult Current BMI 38.8 08/09/2022   Eating disorder 05/03/2022   Precordial chest pain 01/25/2022   SVT (supraventricular tachycardia) (HCC)  01/24/2022   B12 deficiency 11/11/2021   Insulin  resistance 11/11/2021   Umbilical hernia without obstruction and without gangrene    Rectal bleeding    At risk for activity intolerance 05/07/2020   Other hyperlipidemia 03/05/2020   Vitamin D  deficiency 03/05/2020   Depression 03/05/2020   Other fatigue 02/20/2020   SOB (shortness of breath) on  exertion 02/20/2020   Other irritable bowel syndrome 02/20/2020   Absolute anemia 02/20/2020   Mood disorder with emotional eating 02/20/2020   At risk for impaired metabolic function 02/20/2020   Allergic rhinitis 10/31/2019   Gastroesophageal reflux disease without esophagitis 08/08/2019   Migraine 07/12/2019   Obesity Start BMI 41.63 Date 02/20/20 12/22/2014   GAD (generalized anxiety disorder) 06/06/2014   IBS (irritable bowel syndrome) 06/06/2014    Allergies:  Allergies  Allergen Reactions   Latex     Band aids - causes skin irritation     Medications:  Current Outpatient Medications:    Suvorexant  (BELSOMRA ) 10 MG TABS, Take 1 tablet (10 mg total) by mouth at bedtime as needed., Disp: 30 tablet, Rfl: 2   buPROPion  (WELLBUTRIN  SR) 150 MG 12 hr tablet, Take 1 tablet (150 mg total) by mouth daily., Disp: 30 tablet, Rfl: 0   busPIRone  (BUSPAR ) 5 MG tablet, Take 1 tablet (5 mg total) by mouth 3 (three) times daily as needed., Disp: 270 tablet, Rfl: 0   dicyclomine  (BENTYL ) 10 MG capsule, TAKE 1 CAPSULE BY MOUTH 4 TIMES DAILY BEFORE MEALS AND AT BEDTIME FOR ABDOMINAL PAIN, Disp: 360 capsule, Rfl: 0   escitalopram  (LEXAPRO ) 10 MG tablet, Take 1 tablet (10 mg total) by mouth daily., Disp: 90 tablet, Rfl: 3   fluticasone  (FLONASE ) 50 MCG/ACT nasal spray, Place 2 sprays into both nostrils daily., Disp: 16 g, Rfl: 6   ibuprofen  (ADVIL ) 800 MG tablet, Take 1 tablet (800 mg total) by mouth every 8 (eight) hours as needed., Disp: 30 tablet, Rfl: 0   loratadine  (CLARITIN ) 10 MG tablet, Take 1 tablet (10 mg total) by mouth daily., Disp: 30 tablet, Rfl: 2   metFORMIN  (GLUCOPHAGE ) 500 MG tablet, TAKE 1 TABLETS BY MOUTH TWICE DAILY WITH MEALS, Disp: 60 tablet, Rfl: 0   omeprazole  (PRILOSEC) 40 MG capsule, Take 1 capsule (40 mg total) by mouth daily., Disp: 30 capsule, Rfl: 3   ondansetron  (ZOFRAN ) 4 MG tablet, Take 1 tablet (4 mg total) by mouth every 8 (eight) hours as needed for nausea or  vomiting. TAKE 1 TABLET BY MOUTH EVERY 8 HOURS AS NEEDED FOR NAUSEA AND VOMITING, Disp: 90 tablet, Rfl: 2   pindolol  (VISKEN ) 5 MG tablet, 2.5 mg in AM and 5 mg in pm, Disp: 135 tablet, Rfl: 1   topiramate  (TOPAMAX ) 50 MG tablet, Take 1 tablet (50 mg total) by mouth 2 (two) times daily., Disp: 60 tablet, Rfl: 0   Vitamin D , Ergocalciferol , (DRISDOL ) 1.25 MG (50000 UNIT) CAPS capsule, Take 1 capsule (50,000 Units total) by mouth every 7 (seven) days., Disp: 4 capsule, Rfl: 0  Observations/Objective: Patient is well-developed, well-nourished in no acute distress.  Resting comfortably  at home.  Head is normocephalic, atraumatic.  No labored breathing.  Speech is clear and coherent with logical content.  Patient is alert and oriented at baseline.    Assessment and Plan: 1. GAD (generalized anxiety disorder) (Primary)  2. Moderate episode of recurrent major depressive disorder (HCC)  3. Primary insomnia - Suvorexant  (BELSOMRA ) 10 MG TABS; Take 1 tablet (10 mg total) by mouth at bedtime  as needed.  Dispense: 30 tablet; Refill: 2  Continue Lexapro , Buspar , Wellbutrin .  Start Belsomra   Sleep ritual  Follow up 3 months   Follow Up Instructions: I discussed the assessment and treatment plan with the patient. The patient was provided an opportunity to ask questions and all were answered. The patient agreed with the plan and demonstrated an understanding of the instructions.  A copy of instructions were sent to the patient via MyChart unless otherwise noted below.     The patient was advised to call back or seek an in-person evaluation if the symptoms worsen or if the condition fails to improve as anticipated.    Bari Learn, FNP

## 2023-05-18 ENCOUNTER — Telehealth: Payer: Self-pay

## 2023-05-18 ENCOUNTER — Other Ambulatory Visit (HOSPITAL_COMMUNITY): Payer: Self-pay

## 2023-05-18 ENCOUNTER — Telehealth: Payer: Self-pay | Admitting: Family Medicine

## 2023-05-18 NOTE — Telephone Encounter (Signed)
Copied from CRM [id number]. Topic: Clinical - Prescription Issue >> May 18, 2023 11:29 AM Prudencio Pair wrote: Reason for CRM: Patient called stating that pharmacy told her that Dr. Lendon Colonel needs to do a prior authorization for Suvorexant (Belsomra) 10 mg tabs. The insurance was suppose to send it back over to Dr. Lendon Colonel per patient.

## 2023-05-18 NOTE — Telephone Encounter (Signed)
PA request has been Started. New Encounter created for follow up. For additional info see Pharmacy Prior Auth telephone encounter from 05/18/23.

## 2023-05-18 NOTE — Telephone Encounter (Signed)
Pharmacy Patient Advocate Encounter   Received notification from Pt Calls Messages that prior authorization for Belsomra 10MG  tablets is required/requested.   Insurance verification completed.   The patient is insured through [facility] .   Per test claim: PA required; PA started via CoverMyMeds. KEY [id number] . Please see clinical question(s) below that I am not finding the answer to in her chart and advise.

## 2023-05-18 NOTE — Telephone Encounter (Signed)
Pt has tried trazodone in the past without success.

## 2023-05-19 ENCOUNTER — Telehealth: Payer: Self-pay

## 2023-05-19 ENCOUNTER — Other Ambulatory Visit (HOSPITAL_COMMUNITY): Payer: Self-pay

## 2023-05-19 NOTE — Telephone Encounter (Signed)
 Clinical questions have been answered and PA submitted. PA currently Pending.

## 2023-05-19 NOTE — Telephone Encounter (Signed)
Pharmacy Patient Advocate Encounter   Received notification from RX Request Messages that prior authorization for Omeprazole 40MG  dr capsules is required/requested.   Insurance verification completed.   The patient is insured through [facility] .   Per test claim: PA required and submitted KEY/EOC/Request #: [id number] APPROVED from 05/19/23 to 05/18/24. Ran test claim, Copay is $0. This test claim was processed through [facility]- copay amounts may vary at other pharmacies due to pharmacy/plan contracts, or as the patient moves through the different stages of their insurance plan.

## 2023-05-19 NOTE — Telephone Encounter (Signed)
Pharmacy Patient Advocate Encounter  Received notification from [facility] that Prior Authorization for Belsomra 10MG  tablets  has been DENIED.  Full denial letter will be uploaded to the media tab. See denial reason below.   PA #/Case ID/Reference #: [id number]   DENIAL REASON: Patient has not tried and failed a generic non-benzodiazepine sedative-hypnotic (e.g., eszopiclone, zaleplon, zolpidem) or a benzodiazepine (e.g., temazepam)

## 2023-05-23 ENCOUNTER — Other Ambulatory Visit (INDEPENDENT_AMBULATORY_CARE_PROVIDER_SITE_OTHER): Payer: Self-pay | Admitting: Physician Assistant

## 2023-05-23 ENCOUNTER — Telehealth: Payer: Self-pay

## 2023-05-23 ENCOUNTER — Encounter (INDEPENDENT_AMBULATORY_CARE_PROVIDER_SITE_OTHER): Payer: Self-pay | Admitting: Physician Assistant

## 2023-05-23 ENCOUNTER — Ambulatory Visit (INDEPENDENT_AMBULATORY_CARE_PROVIDER_SITE_OTHER): Payer: 59 | Admitting: Physician Assistant

## 2023-05-23 VITALS — BP 112/69 | HR 68 | Temp 98.4°F | Ht 63.0 in | Wt 225.0 lb

## 2023-05-23 DIAGNOSIS — E88819 Insulin resistance, unspecified: Secondary | ICD-10-CM | POA: Diagnosis not present

## 2023-05-23 DIAGNOSIS — E7849 Other hyperlipidemia: Secondary | ICD-10-CM

## 2023-05-23 DIAGNOSIS — Z6839 Body mass index (BMI) 39.0-39.9, adult: Secondary | ICD-10-CM

## 2023-05-23 DIAGNOSIS — E669 Obesity, unspecified: Secondary | ICD-10-CM

## 2023-05-23 MED ORDER — WEGOVY 0.25 MG/0.5ML ~~LOC~~ SOAJ: 0.2500 mg | SUBCUTANEOUS | 0 refills | Status: DC

## 2023-05-23 MED ORDER — ZOLPIDEM TARTRATE 5 MG PO TABS
5.0000 mg | ORAL_TABLET | Freq: Every evening | ORAL | 2 refills | Status: DC | PRN
Start: 2023-05-23 — End: 2023-05-29

## 2023-05-23 NOTE — Progress Notes (Signed)
SUBJECTIVE:  Chief Complaint: Obesity  Interim History: She is up 11 lbs since her last visit.  Down 10 lbs overall.  Bio impedence scale reviewed with the patient:  Muscle mass + 0.8 lbs Adipose mass + 9.4 lbs Total body water + 3.2 lbs  Brittney Farmer is a 38 year old individual with a history of obesity, presents for a follow-up visit regarding her weight management plan. She is currently on metformin, ergocalciferol, Lexapro, bupropion, and Buspar. The patient reports a recent weight gain, despite adherence to her treatment plan. She expresses frustration with her weight fluctuations, noting a recent increase from 218 to 225 pounds after a brief drop from over 230 pounds.  The patient acknowledges some indulgence over the holiday season but insists she has been strict with her diet since the new year, focusing on low-carb and high-protein foods. Despite these efforts, she reports a rapid weight gain of nearly 20 pounds within a few weeks. She expresses concern about her insulin resistance and its impact on her weight.  The patient also discusses her struggles with meal planning due to time constraints and the dietary preferences of her family. She mentions using protein shakes for breakfast and trying to prepare meals in advance when possible. However, she also admits to feeling constantly deprived and guilty about her food choices, which has led to increased cravings and difficulty adhering to her diet plan.  The patient is interested in exploring additional treatment options for her obesity and hyperlipidemia, including Brittney Farmer and Brittney Farmer, but expresses concern about the cost and insurance coverage. She also expresses dissatisfaction with metformin, feeling it is no longer effective for her. The patient is eager to find a solution that will help her manage her weight and improve her overall health.  Brittney Farmer is here to discuss her progress with her obesity treatment plan. She is on the  Category 2 Plan and states she is following her eating plan approximately ? % of the time. She states she is exercising walking 30-40 minutes every other day times per week.   OBJECTIVE: Visit Diagnoses: Problem List Items Addressed This Visit     Obesity Start BMI 41.63 Date 02/20/20   Relevant Medications   Semaglutide-Weight Management (Brittney Farmer) 0.25 MG/0.5ML SOAJ   Other hyperlipidemia - Primary   Relevant Medications   Semaglutide-Weight Management (Brittney Farmer) 0.25 MG/0.5ML SOAJ   Insulin resistance   Other Visit Diagnoses       BMI 39.0-39.9,adult Current BMI 39.9         Obesity 38 year old with obesity, currently on metformin 500 mg BID, ergocalciferol 50,000 units weekly, Lexapro 10 mg daily, bupropion 150 mg daily, and Buspar 5 mg TID PRN. Despite dietary efforts, significant weight fluctuations persist. Insulin resistance is a contributing factor. Discussed Brittney Farmer for weight management, but insurance coverage is a barrier. Explored Brittney Farmer as an alternative. Explained Brittney Farmer's risks, including gallbladder disease and contraindications in pancreatitis or medullary thyroid cancer. Emphasized protein intake and meal planning, recommending Brittney Farmer's Meats for high-protein meals. - Submit prior authorization for Brittney Farmer - If Brittney Farmer is not approved, explore Brittney Farmer - Encourage protein intake and meal planning - Recommend Brittney Farmer's Meats for high-protein meals  Insulin Resistance Significant insulin resistance contributing to rapid weight gain. Current metformin regimen is ineffective. Discussed Brittney Farmer and Brittney Farmer for management. Explained Brittney Farmer's risks and costs. - Consider alternative medications if Brittney Farmer or Brittney Farmer are not approved - Manage insulin resistance through diet and potential pharmacotherapy adjustments  Hyperlipidemia Significant hyperlipidemia with LDL of 165. Discussed Brittney Farmer's FDA  indication for hyperlipidemia. No contraindications present. Explained Brittney Farmer's risks  and costs. - Submit prior authorization for Brittney Farmer - If Brittney Farmer is not approved, consider Brittney Farmer  General Health Maintenance Emphasized balanced diet and meal planning due to busy schedule. Recommended high-protein snacks and meals. Advised against unsustainable restrictive diets. - Encourage meal planning and preparation - Recommend high-protein snacks and meals - Advise against unsustainable restrictive diets  Follow-up - Follow-up appointment scheduled for February 18th.  Vitals Temp: 98.4 F (36.9 C) BP: 112/69 Pulse Rate: 68 SpO2: 99 %   Anthropometric Measurements Height: 5\' 3"  (1.6 m) Weight: 225 lb (102.1 kg) BMI (Calculated): 39.87 Weight at Last Visit: 214 lb Weight Lost Since Last Visit: 0 Weight Gained Since Last Visit: 11 lb Starting Weight: 235 lb Total Weight Loss (lbs): 32 lb (14.5 kg) Peak Weight: 256 lb   Body Composition  Body Fat %: 46.2 % Fat Mass (lbs): 104 lbs Muscle Mass (lbs): 114.8 lbs Total Body Water (lbs): 84.6 lbs Visceral Fat Rating : 12   Other Clinical Data Fasting: yes Labs: no Today's Visit #: 36 Starting Date: 02/20/20     ASSESSMENT AND PLAN:  Diet: Brittney Farmer is currently in the action stage of change. As such, her goal is to continue with weight loss efforts. She has agreed to Category 2 Plan.  Exercise: Brittney Farmer has been instructed to work up to a goal of 150 minutes of combined cardio and strengthening exercise per week for weight loss and overall health benefits.   Behavior Modification:  We discussed the following Behavioral Modification Strategies today: increasing lean protein intake, decreasing simple carbohydrates, increasing vegetables, increase H2O intake, increase high fiber foods, no skipping meals, meal planning and cooking strategies, emotional eating strategies , avoiding temptations, and planning for success. We discussed various medication options to help Brittney Farmer with her weight loss efforts and we both  agreed to start [facility] [employer] for primary indication of hyperlipidemia and continue to work on nutritional and behavioral strategies to promote weight loss.  .  Return in about 3 weeks (around 06/13/2023).Marland Kitchen She was informed of the importance of frequent follow up visits to maximize her success with intensive lifestyle modifications for her multiple health conditions.  Attestation Statements:   Reviewed by clinician on day of visit: allergies, medications, problem list, medical history, surgical history, family history, social history, and previous encounter notes.   Time spent on visit including pre-visit chart review and post-visit care and charting was 35 minutes.    Veronica Guerrant, PA-C

## 2023-05-23 NOTE — Telephone Encounter (Signed)
Belsomra denied, Ambien Prescription sent to pharmacy.

## 2023-05-23 NOTE — Telephone Encounter (Signed)
Look at at original message

## 2023-05-23 NOTE — Telephone Encounter (Signed)
Lmtcb.

## 2023-05-23 NOTE — Telephone Encounter (Signed)
Copied from CRM [id number]. Topic: Clinical - Prescription Issue >> May 23, 2023  8:18 AM Geroge Baseman wrote: Reason for CRM: Patient is still waiting for this medication to be filled Suvorexant (BELSOMRA) 10 MG TABS. [facility] pharmacy does not have the prescription. Please call patient to advise.

## 2023-05-23 NOTE — Addendum Note (Signed)
Addended by: Jannifer Rodney A on: 05/23/2023 08:51 AM   Modules accepted: Orders

## 2023-05-23 NOTE — Telephone Encounter (Signed)
Patient aware and verbalized understanding. °

## 2023-05-29 ENCOUNTER — Other Ambulatory Visit: Payer: Self-pay | Admitting: Family

## 2023-05-29 ENCOUNTER — Telehealth (INDEPENDENT_AMBULATORY_CARE_PROVIDER_SITE_OTHER): Payer: Self-pay | Admitting: Physician Assistant

## 2023-05-29 NOTE — Telephone Encounter (Signed)
Patient called in and stated her [employer] is faxing a prior authorization for the [employer]. Patient also stated her [employer] told her they do cover these 2 medications: Liraglutide Trulicity

## 2023-05-29 NOTE — Telephone Encounter (Signed)
Copied from CRM [id number]. Topic: Clinical - Prescription Issue >> May 29, 2023  2:18 PM Clayton Bibles wrote: Reason for CRM: [facility] zolpidem (AMBIEN) 5 MG tablet from insurance. Kristee has not received the medication since 05/23/23 and it is a new medication

## 2023-05-30 MED ORDER — ZOLPIDEM TARTRATE 5 MG PO TABS: 5.0000 mg | ORAL_TABLET | Freq: Every evening | ORAL | 2 refills | Status: DC | PRN

## 2023-05-31 ENCOUNTER — Telehealth: Payer: Self-pay

## 2023-05-31 ENCOUNTER — Encounter (INDEPENDENT_AMBULATORY_CARE_PROVIDER_SITE_OTHER): Payer: Self-pay

## 2023-05-31 NOTE — Telephone Encounter (Signed)
PA submitted through Cover My Meds for [facility]. Awaiting insurance determination. Key: [id number]

## 2023-06-08 ENCOUNTER — Other Ambulatory Visit (HOSPITAL_COMMUNITY): Payer: Self-pay

## 2023-06-20 ENCOUNTER — Encounter (INDEPENDENT_AMBULATORY_CARE_PROVIDER_SITE_OTHER): Payer: Self-pay | Admitting: Physician Assistant

## 2023-06-20 ENCOUNTER — Telehealth: Payer: Self-pay | Admitting: Family Medicine

## 2023-06-20 ENCOUNTER — Ambulatory Visit (INDEPENDENT_AMBULATORY_CARE_PROVIDER_SITE_OTHER): Payer: 59 | Admitting: Physician Assistant

## 2023-06-20 VITALS — BP 139/75 | HR 69 | Temp 97.7°F | Ht 63.0 in | Wt 225.0 lb

## 2023-06-20 DIAGNOSIS — F32A Depression, unspecified: Secondary | ICD-10-CM | POA: Diagnosis not present

## 2023-06-20 DIAGNOSIS — E669 Obesity, unspecified: Secondary | ICD-10-CM | POA: Diagnosis not present

## 2023-06-20 DIAGNOSIS — E88819 Insulin resistance, unspecified: Secondary | ICD-10-CM | POA: Diagnosis not present

## 2023-06-20 DIAGNOSIS — F5089 Other specified eating disorder: Secondary | ICD-10-CM

## 2023-06-20 DIAGNOSIS — E7849 Other hyperlipidemia: Secondary | ICD-10-CM

## 2023-06-20 DIAGNOSIS — E559 Vitamin D deficiency, unspecified: Secondary | ICD-10-CM

## 2023-06-20 DIAGNOSIS — Z6839 Body mass index (BMI) 39.0-39.9, adult: Secondary | ICD-10-CM

## 2023-06-20 DIAGNOSIS — F331 Major depressive disorder, recurrent, moderate: Secondary | ICD-10-CM

## 2023-06-20 MED ORDER — TRULICITY 0.75 MG/0.5ML ~~LOC~~ SOAJ: 0.7500 mg | SUBCUTANEOUS | 0 refills | Status: DC

## 2023-06-20 MED ORDER — METFORMIN HCL 500 MG PO TABS: ORAL_TABLET | ORAL | 0 refills | Status: DC

## 2023-06-20 MED ORDER — TOPIRAMATE 50 MG PO TABS: ORAL_TABLET | ORAL | 0 refills | Status: DC

## 2023-06-20 MED ORDER — VITAMIN D (ERGOCALCIFEROL) 1.25 MG (50000 UNIT) PO CAPS: 50000.0000 [IU] | ORAL_CAPSULE | ORAL | 0 refills | Status: DC

## 2023-06-20 MED ORDER — BUPROPION HCL ER (SR) 150 MG PO TB12: 150.0000 mg | ORAL_TABLET | Freq: Every day | ORAL | 0 refills | Status: DC

## 2023-06-20 NOTE — Telephone Encounter (Unsigned)
 Copied from CRM [id number]. Topic: Clinical - Prescription Issue >> Jun 20, 2023  4:27 PM Prudencio Pair wrote: Reason for CRM: Patient called frustrated that her medication that she needs for sleeping keeps getting declined. Patient states she has been calling in regards to this for over a month now. States Dr. Lendon Colonel prescribed her Ambien which makes her sleepy but at night it keeps her awake. She is wanting another prescription sent, preferrably something that her insurance will cover and pay for. Please give patient a call to advise and update her on the issue. CB #: [phone].

## 2023-06-20 NOTE — Progress Notes (Signed)
 SUBJECTIVE:  Chief Complaint: Obesity  Interim History: Brittney has maintained since her last visit.  Bio impedence scale reviewed with the patient:  Muscle mass + 2.2 lbs Adipose mass - 2.2 lbs Total body water - 1 lb  Farmer is here to discuss her progress with her obesity treatment plan. Brittney is on the Category 2 Plan and states Brittney is following her eating plan approximately 50 % of the time. Brittney states Brittney is exercising walking 30 minutes 5-7 times per week. Brittney Farmer is here to discuss her progress with her obesity treatment plan. Brittney is on the Category 2 Plan and states Brittney is following her eating plan approximately 50 % of the time. Brittney states Brittney is exercising walking 30 minutes 5-7 times per week. Brittney Farmer is a 38 year old female her progress with her obesity treatment plan. Brittney is on the Category 2 Plan and states Brittney is following her eating plan approximately 50 % of the time. Brittney states Brittney is exercising walking 30 minutes 5-7 times per week.  Brittney Farmer is a 38 year old female with insulin resistance, hyperlipidemia, and vitamin D deficiency who presents for follow-up of her obesity treatment plan.  Brittney experiences weight fluctuations and is frustrated with weight loss efforts.   Her current medications include metformin 500 mg twice daily, bupropion 150 mg daily, topiramate 50 mg twice daily, and ergocalciferol 50,000 units weekly. Her insurance does not cover Wegovy for hyperlipidemia, but it may cover   Trulicity or Victoza.  We have reviewed the risks and benefits of using a GLP-1. The patient denies a personal or family history of medullary thyroid cancer or MENII. The patient denies a history of pancreatitis. The potential risks and benefits of this GLP-1 were reviewed with the patient, and alternative treatment options were discussed. All questions were answered, and the patient wishes to move forward with this medication. Brittney would like to try Trulicity.   Brittney has difficulty sleeping, which Brittney attributes to her medication regimen. Brittney is inconsistent with bupropion due to its impact on her sleep, stating, 'It makes me not sleep at night.' Brittney has tried various times of day for taking her medications, including topiramate, but continues to experience sleep disturbances. Brittney also experienced headaches with a previously prescribed sleeping medication, which Brittney discontinued.  Brittney describes her diet and exercise routine, mentioning regular walking and considering  using a treadmill. Brittney wants to manage her diet better, struggling with cravings, particularly for sweets in the afternoon. Brittney is considering calorie counting and journaling to help manage her diet. Brittney is frustrated with her metabolism and weight gain, especially compared to her niece, who is losing weight more easily.  OBJECTIVE: Visit Diagnoses: Problem List Items Addressed This Visit     Obesity Start BMI 41.63 Date 02/20/20   Relevant Medications   metFORMIN (GLUCOPHAGE) 500 MG tablet   Dulaglutide (TRULICITY) 0.75 MG/0.5ML SOAJ   Other hyperlipidemia   Vitamin D deficiency   Relevant Medications   Vitamin D, Ergocalciferol, (DRISDOL) 1.25 MG (50000 UNIT) CAPS capsule   Depression   Relevant Medications   buPROPion (WELLBUTRIN SR) 150 MG 12 hr tablet   Insulin resistance - Primary   Relevant Medications   metFORMIN (GLUCOPHAGE) 500 MG tablet   Dulaglutide (TRULICITY) 0.75 MG/0.5ML SOAJ   Eating disorder   Relevant Medications   topiramate (TOPAMAX) 50 MG tablet  Obesity Brittney Farmer, a 38 year old female with insulin resistance, hyperlipidemia, and vitamin D deficiency, is seen for follow-up on her obesity treatment. Brittney has lost 2.2 pounds of adipose and built 2.2 lbs of muscle recently.  Previous attempts to obtain [facility] were denied by her insurance. Discussed alternative medications, including Trulicity and Victoza. Chose Trulicity due to better weight loss outcomes and weekly dosing, which enhances compliance. Emphasized monitoring for side effects and reporting them immediately. Discussed potential new medications in development. - Start Trulicity 0.75 mg weekly, increasing after 28 days if tolerated - Educate on Trulicity administration using a pen - Monitor for side effects and report immediately -  Continue /refill metformin 500 mg twice daily, bupropion 150 mg daily, topiramate 50-100 mg at bedtime only,  ergocalciferol 50,000 units weekly - Switch topiramate to  bedtime dosing, starting with 50 mg and increasing to 100 mg if tolerated - Encourage continued walking and consider using ankle weights - Maintain a clean diet and journal food intake using apps like Lose It or My Fitness Pal - Set a goal to stay under 215 pounds  Insomnia Brittney Farmer reports difficulty sleeping, potentially related to her medication regimen. Brittney experiences daytime sleepiness and difficulty staying asleep. Discussed the possibility of bupropion contributing to her insomnia and switching topiramate dosing to bedtime. - Switch topiramate dosing to bedtime - Continue taking bupropion early in the day  Hyperlipidemia Brittney Farmer is undergoing treatment for hyperlipidemia with lifestyle modifications and medication management. - Continue current medications and lifestyle modifications  Vitamin D Deficiency Brittney Farmer is on ergocalciferol 50,000 units weekly for vitamin D deficiency. - Continue ergocalciferol 50,000 units weekly  General Health Maintenance Discussed the importance of a balanced diet, regular exercise, and monitoring caloric intake. Set a calorie budget of 1300-1400 calories per day with 85+ grams of protein and 20-25 grams of fiber. Advised limiting diet drinks to two per day. Provided a list of high-protein, low-calorie foods and encouraged the use of protein shakes and snacks like Yasso Greek yogurt bars. - Journal food intake using apps like Lose It or My Fitness Pal - Set a calorie budget of 1300-1400 calories per day with 85+ grams of protein and 20-25 grams of fiber - Limit diet drinks to two per day - Use high-protein, low-calorie foods, protein shakes, and snacks like Yasso Greek yogurt bars  Follow-up - Schedule follow-up appointment in one month - Order labs at the next visit to monitor overall health and treatment efficacy.  Vitals Temp: 97.7 F (36.5 C) BP: 139/75 Pulse Rate: 69 SpO2: 99 %   Anthropometric Measurements Height: 5\' 3"  (1.6  m) Weight: 225 lb (102.1 kg) BMI (Calculated): 39.87 Weight at Last Visit: 225lb Weight Lost Since Last Visit: 0 Weight Gained Since Last Visit: 0 Starting Weight: 235lb Total Weight Loss (lbs): 32 lb (14.5 kg) Peak Weight: 256lb   Body Composition  Body Fat %: 45.2 % Fat Mass (lbs): 101.8 lbs Muscle Mass (lbs): 117 lbs Total Body Water (lbs): 83.6 lbs Visceral Fat Rating : 11   Other Clinical Data Fasting: no Labs: no Today's Visit #: 37 Starting Date: 02/20/20     ASSESSMENT AND PLAN:  Diet: Brittney Farmer is currently in the action stage of change. As such, her goal is to continue with weight loss efforts. Brittney has agreed to keeping a food journal and adhering to recommended goals of 1300-1400 calories and 85+ grams of protein.  Exercise: Brittney Farmer has been instructed to continue exercising as is for weight loss and overall health benefits.   Behavior Modification:  We discussed the following Behavioral Modification Strategies today: increasing lean protein intake, decreasing simple carbohydrates, increasing vegetables, increase H2O intake, increase high fiber foods, meal planning and cooking strategies, better snacking choices, avoiding temptations, planning for success, and keep a strict food journal. We discussed various medication options to help Brittney Farmer with her weight loss efforts and we both agreed to start Trulicity for primary indication of prediabetes.  Return in about 4 weeks (around 07/18/2023).Marland Kitchen Brittney was informed of the importance of frequent follow up visits to maximize her success with intensive lifestyle modifications for her multiple health conditions.  Attestation Statements:   Reviewed  by clinician on day of visit: allergies, medications, problem list, medical history, surgical history, family history, social history, and previous encounter notes.   Time spent on visit including pre-visit chart review and post-visit care and charting was 49 minutes.     Natelie Ostrosky, PA-C

## 2023-06-22 MED ORDER — TEMAZEPAM 7.5 MG PO CAPS: 7.5000 mg | ORAL_CAPSULE | Freq: Every evening | ORAL | 0 refills | Status: DC | PRN

## 2023-06-22 NOTE — Telephone Encounter (Signed)
 Pt aware medication was sent to pharmacy.

## 2023-06-22 NOTE — Telephone Encounter (Signed)
 Restoril 7.5 mg Prescription sent to pharmacy

## 2023-06-28 ENCOUNTER — Ambulatory Visit: Payer: Self-pay | Admitting: Family

## 2023-06-28 ENCOUNTER — Ambulatory Visit (INDEPENDENT_AMBULATORY_CARE_PROVIDER_SITE_OTHER): Payer: 59 | Admitting: Nurse Practitioner

## 2023-06-28 ENCOUNTER — Encounter: Payer: Self-pay | Admitting: Nurse Practitioner

## 2023-06-28 ENCOUNTER — Ambulatory Visit (INDEPENDENT_AMBULATORY_CARE_PROVIDER_SITE_OTHER): Payer: 59

## 2023-06-28 VITALS — BP 117/76 | HR 74 | Temp 97.5°F | Ht 63.0 in | Wt 227.6 lb

## 2023-06-28 DIAGNOSIS — S199XXA Unspecified injury of neck, initial encounter: Secondary | ICD-10-CM | POA: Diagnosis not present

## 2023-06-28 DIAGNOSIS — M542 Cervicalgia: Secondary | ICD-10-CM

## 2023-06-28 MED ORDER — KETOROLAC TROMETHAMINE 30 MG/ML IJ SOLN
30.0000 mg | Freq: Once | INTRAMUSCULAR | Status: AC
Start: 2023-06-28 — End: 2023-06-28
  Administered 2023-06-28: 30 mg via INTRAMUSCULAR

## 2023-06-28 MED ORDER — PREDNISONE 10 MG (21) PO TBPK: ORAL_TABLET | ORAL | 0 refills | Status: DC

## 2023-06-28 MED ORDER — CYCLOBENZAPRINE HCL 5 MG PO TABS: 5.0000 mg | ORAL_TABLET | Freq: Three times a day (TID) | ORAL | 0 refills | Status: DC | PRN

## 2023-06-28 NOTE — Telephone Encounter (Signed)
 Pt being seen now

## 2023-06-28 NOTE — Progress Notes (Signed)
 Acute Office Visit  Subjective:     Patient ID: Brittney Farmer, female    DOB: 1986/03/15, 38 y.o.   MRN: [id number]  Chief Complaint  Patient presents with   Neck Injury    Hurt neck Saturday after hitting head unable to move neck without pain    HPI Brittney Farmer 38 year old female present June 28, 2023 for an acute visit concern for neck pain Neck Pain: Paitent complains of neck pain. Event that precipitate these symptoms: injured while "was in a truck was moving from the back seat to the front seat. Botfriend hit the break left side f the forehead hit the dashboard and the neck move to the righ and started experiencing pain  started a dya later on Sunday" . Onset of symptoms 4 days ago, gradually worsening since that time. Current symptoms are pain in left side neck and radiate to  to tip of shoulder and back of neck (aching, burning, pulsating, shooting, and stabbing in character; 8/10 in severity). Patient denies numbness in hands . Patient has had no prior neck problems.  Previous treatments include: none.   Active Ambulatory Problems    Diagnosis Date Noted   GAD (generalized anxiety disorder) 06/06/2014   IBS (irritable bowel syndrome) 06/06/2014   Obesity Start BMI 41.63 Date 02/20/20 12/22/2014   Migraine 07/12/2019   Gastroesophageal reflux disease without esophagitis 08/08/2019   Allergic rhinitis 10/31/2019   Other fatigue 02/20/2020   SOB (shortness of breath) on exertion 02/20/2020   Other irritable bowel syndrome 02/20/2020   Absolute anemia 02/20/2020   Mood disorder with emotional eating 02/20/2020   At risk for impaired metabolic function 02/20/2020   Other hyperlipidemia 03/05/2020   Vitamin D deficiency 03/05/2020   Depression 03/05/2020   At risk for activity intolerance 05/07/2020   Rectal bleeding    Umbilical hernia without obstruction and without gangrene    B12 deficiency 11/11/2021   Insulin resistance 11/11/2021   SVT (supraventricular  tachycardia) (HCC) 01/24/2022   Precordial chest pain 01/25/2022   Eating disorder 05/03/2022   BMI 38.0-38.9,adult Current BMI 38.8 08/09/2022   Neck pain 06/28/2023   Resolved Ambulatory Problems    Diagnosis Date Noted   Abdominal pain, chronic, right upper quadrant 06/06/2014   Gastroesophageal reflux disease with esophagitis 06/06/2014   Depression, recurrent (HCC) 08/04/2014   BMI 40.0-44.9, adult (HCC) 05/18/2017   At risk for deficient intake of food 03/05/2020   Anemia 03/05/2020   At risk for dehydration 04/09/2020   GERD (gastroesophageal reflux disease)    Abdominal pain, epigastric    Nausea with vomiting    Esophageal dysphagia    Class 3 severe obesity with serious comorbidity and body mass index (BMI) of 40.0 to 44.9 in adult (HCC) 05/03/2022   BMI 40.0-44.9, adult (HCC)-current bmi 41.1 06/23/2022   Nausea 03/28/2023   Past Medical History:  Diagnosis Date   ADD (attention deficit disorder)    ADHD    Anxiety    Back pain    Constipation    Palpitations    Spine curvature, acquired    Stomach ulcer     Review of Systems  Constitutional:  Negative for chills and fever.  HENT:  Negative for congestion and sore throat.   Respiratory:  Negative for cough, shortness of breath and wheezing.   Cardiovascular:  Negative for chest pain and leg swelling.  Gastrointestinal:  Negative for constipation, diarrhea, nausea and vomiting.  Musculoskeletal:  Positive for neck pain.  8/10 that radiate to tip on left shoulder  Skin:  Negative for itching and rash.  Neurological:  Negative for dizziness, tingling, weakness and headaches.   Negative unless indicated in HPI    Objective:    BP 117/76   Pulse 74   Temp (!) 97.5 F (36.4 C) (Temporal)   Ht 5\' 3"  (1.6 m)   Wt 227 lb 9.6 oz (103.2 kg)   LMP 06/15/2023   SpO2 99%   BMI 40.32 kg/m  BP Readings from Last 3 Encounters:  06/28/23 117/76  06/20/23 139/75  05/23/23 112/69   Wt Readings from Last 3  Encounters:  06/28/23 227 lb 9.6 oz (103.2 kg)  06/20/23 225 lb (102.1 kg)  05/23/23 225 lb (102.1 kg)      Physical Exam Vitals and nursing note reviewed.  Constitutional:      General: She is not in acute distress. HENT:     Head: Normocephalic and atraumatic.     Nose: Nose normal.     Mouth/Throat:     Mouth: Mucous membranes are moist.  Eyes:     General: No scleral icterus.    Extraocular Movements: Extraocular movements intact.     Conjunctiva/sclera: Conjunctivae normal.     Pupils: Pupils are equal, round, and reactive to light.  Cardiovascular:     Heart sounds: Normal heart sounds.  Pulmonary:     Effort: Pulmonary effort is normal.     Breath sounds: Normal breath sounds.  Musculoskeletal:     Cervical back: No edema. Pain with movement and muscular tenderness present. Decreased range of motion.     Right lower leg: No edema.     Left lower leg: No edema.  Skin:    General: Skin is warm and dry.     Findings: No rash.  Neurological:     Mental Status: She is alert and oriented to person, place, and time.  Psychiatric:        Mood and Affect: Mood normal.        Behavior: Behavior normal.        Thought Content: Thought content normal.        Judgment: Judgment normal.    A cervical X-Ray was ordered. My reading of this film is prelimary. (No comparison films available: pending review by Radiologist.)  No results found for any visits on 06/28/23.      Assessment & Plan:  Neck pain -     DG Cervical Spine Complete -     Ketorolac Tromethamine -     predniSONE; Use as directed on back of pill pack  Dispense: 21 tablet; Refill: 0 -     Cyclobenzaprine HCl; Take 1 tablet (5 mg total) by mouth 3 (three) times daily as needed for muscle spasms.  Dispense: 30 tablet; Refill: 0   Discussed 38 year old female seen today for neck pain, no acute distress Neck pain:A cervical X-Ray was ordered. My reading of this film is prelimary. (No comparison films  available: pending review by Radiologist.)  30 mL of IM Toradol administered in the office Prednisone 10 mg #21 dispensed client to follow instruction on the packet Flexeril 5 mg 3 times daily as needed Work restriction note provided Client to call the clinic with any concern Address for emergent or to emergency clinic provided to client if pain worsen  All question answered  The above assessment and management plan was discussed with the patient. The patient verbalized understanding of and has agreed to the  management plan. Patient is aware to call the clinic if they develop any new symptoms or if symptoms persist or worsen. Patient is aware when to return to the clinic for a follow-up visit. Patient educated on when it is appropriate to go to the emergency department.  Return if symptoms worsen or fail to improve.  Arrie Aran Santa Lighter, [facility] [address] [place], [place] [address] [phone]  Note: This document was prepared by [employer] voice dictation technology and any errors that results from this process are unintentional.

## 2023-06-28 NOTE — Telephone Encounter (Signed)
  Chief Complaint: left neck pain , headaches after "horseplay "  Symptoms: hit top of head and bent left side neck during horseplay in car and now left neck pain difficulty moving neck.  Stabbing pain radiates up back of neck Frequency: Saturday  Pertinent Negatives: Patient denies chest pain no difficulty breathing  no difficulty swallowing Disposition: [] ED /[] Urgent Care (no appt availability in office) / [x] Appointment(In office/virtual)/ []  [facility]/ [] Home Care/ [] Refused Recommended Disposition /[] [facility]/ []  Follow-up with PCP Additional Notes:   Appt scheduled this am.    Copied from CRM [id number]. Topic: Clinical - Red Word Triage >> Jun 28, 2023  8:05 AM Dennison Nancy wrote: Red Word that prompted transfer to Nurse Triage: hit head and injured neck with pain and hard time turning head and bad headaches .  saturday was horseplaying on truck and a movement of the truck caused patient to hit her head on the dashboard of the truck Reason for Disposition  1] MILD or MODERATE neck pain AND [2] fall from 3 feet (1 meter) or 5 stairs, or higher  Answer Assessment - Initial Assessment Questions 1. MECHANISM: "How did the injury happen?" (e.g., fall, MVA, twisting injury; consider the possibility of domestic violence or elder abuse)     Saturday hit top of head and injured neck during horseplay in car 2. ONSET: "When did the injury happen?" (e.g., minutes, hours, days)     Saturday  3. LOCATION: "What part of the neck is injured?" "Where does it hurt?"     Left side neck from jaw area  4. PAIN SEVERITY: "How bad is the pain?" "Can you move the neck normally?" (Scale 1-10; or mild, moderate, severe)   - NO PAIN (0): no pain, or only slight stiffness    - MILD (1-3): doesn't interfere with normal activities    - MODERATE (4-7): interferes with normal activities or awakens from sleep    - SEVERE (8-10):  excruciating pain, unable to do any normal activities        7- 8 with movement can fall asleep wakes up during sleep 5. CORD SYMPTOMS: "Any weakness or numbness of the arms or legs?"     no 6. SIZE: For cuts, bruises, or swelling, ask: "How large is it?" (e.g., inches or centimeters)      Na  7. TETANUS: For any breaks in the skin, ask: "When was the last tetanus booster?"     na 8. OTHER SYMPTOMS: "Do you have any other symptoms?" (e.g., headache)     Stabbing pain radiates up back of neck  9. PREGNANCY: "Is there any chance you are pregnant?" "When was your last menstrual period?"     na  Protocols used: Neck Injury-A-AH

## 2023-06-28 NOTE — Patient Instructions (Addendum)
 [facility]  [facility] [address], [place], [place] [address] Phone: [phone]

## 2023-07-14 ENCOUNTER — Other Ambulatory Visit (INDEPENDENT_AMBULATORY_CARE_PROVIDER_SITE_OTHER): Payer: Self-pay | Admitting: Physician Assistant

## 2023-07-14 DIAGNOSIS — E88819 Insulin resistance, unspecified: Secondary | ICD-10-CM

## 2023-07-17 NOTE — Progress Notes (Unsigned)
 SUBJECTIVE: Discussed the use of AI scribe software for clinical note transcription with the patient, who gave verbal consent to proceed.  Chief Complaint: Obesity  Interim History: She is down 4 lbs since last visit Down 36 lbs overall TBW loss of 15.32%  [name] is here to discuss her progress with her obesity treatment plan. She is on the Category 2 Plan and keeping a food journal and adhering to recommended goals of 1300-1400 calories and 85 grams of protein and states she is following her eating plan approximately 75 % of the time. She states she is exercising walking 60 minutes 6 times per week.  Brittney Farmer is a 38 year old female who presents for follow-up of her obesity treatment plan.  She has lost four pounds since her last visit and continues to adhere to her obesity treatment plan. She is currently on Trulicity and metformin for insulin resistance, and topiramate for emotional eating. She experienced some mild stomach cramps since starting Trulicity but otherwise notes no significant changes. She administers Trulicity in her abdomen and has noticed some bruising, which she attributes to her tendency to bruise easily. She is considering alternative injection sites such as the thigh or arm.  Her medication regimen includes metformin, which causes nausea, so she takes it at night to minimize this effect. She has stopped taking Lexapro due to sleep disturbances and is currently on bupropion and topiramate, with the latter taken primarily at night.   She has been more physically active, incorporating walking into her routine, and we noted a decrease in adipose tissue from 101.8 to 96.6 lbs. She feels frustrated with weight fluctuations, feeling that she gains weight only to lose it again.  She recounts a recent trip to [place] where she indulged in a 'cheat day' with her daughter and niece, consuming a high-calorie milkshake. Despite this, she managed to walk over 15,000 steps  that day.   OBJECTIVE: Visit Diagnoses: Problem List Items Addressed This Visit     Obesity Start BMI 41.63 Date 02/20/20   Relevant Medications   Dulaglutide (TRULICITY) 1.5 MG/0.5ML SOAJ   metFORMIN (GLUCOPHAGE) 500 MG tablet   Vitamin D deficiency   Relevant Medications   Vitamin D, Ergocalciferol, (DRISDOL) 1.25 MG (50000 UNIT) CAPS capsule   Depression   Relevant Medications   buPROPion (WELLBUTRIN SR) 150 MG 12 hr tablet   Insulin resistance - Primary   Relevant Medications   Dulaglutide (TRULICITY) 1.5 MG/0.5ML SOAJ   metFORMIN (GLUCOPHAGE) 500 MG tablet   Eating disorder   Relevant Medications   topiramate (TOPAMAX) 50 MG tablet   Other Visit Diagnoses       BMI 39.0-39.9,adult Current BMI 39.3         Obesity Brittney Farmer is being followed up for obesity treatment. She has lost four pounds since her last visit and is currently on Trulicity and metformin for insulin resistance, and topiramate for emotional eating. She reports stomach cramps from Trulicity and occasional nausea from metformin. Increasing the Trulicity dose to 1.5 mg is planned to enhance metabolic effects, curb hunger and cravings, and manage insulin resistance to prevent progression to type 2 diabetes. The anticipated outcome is improved blood sugar control and weight loss. She is encouraged to maintain physical activity and consider using a weighted vest for added resistance during walks. Potential side effects of Trulicity include gastrointestinal discomfort, and she is advised to monitor for these. - Increase Trulicity to 1.5 mg - Continue metformin and topiramate - Refill metformin  and topiramate prescriptions - Encourage physical activity and consider using a weighted vest - Monitor for side effects of Trulicity and metformin    Insulin Resistance Last fasting insulin was 12.3- not at goal. A1c was 5.36- at goal. Polyphagia:No Medication(s): Trulicity 0.75 mg SQ weekly Denies mass in neck,  dysphagia, dyspepsia, persistent hoarseness, abdominal pain, or N/V/Constipation or diarrhea. Has annual eye exam. Mood is stable.   Lab Results  Component Value Date   HGBA1C 5.3 01/31/2023   HGBA1C 5.3 05/10/2022   HGBA1C 5.2 11/11/2021   HGBA1C 5.1 02/20/2020   Lab Results  Component Value Date   INSULIN 12.3 01/31/2023   INSULIN 10.0 05/10/2022   INSULIN 12.5 11/11/2021   INSULIN 5.2 02/20/2020    Plan: Continue and refill and Continue and increase dose Trulicity 1.5 mg SQ weekly Meds ordered this encounter  Medications   Dulaglutide (TRULICITY) 1.5 MG/0.5ML SOAJ    Sig: Inject 1.5 mg into the skin once a week.    Dispense:  2 mL    Refill:  0   metFORMIN (GLUCOPHAGE) 500 MG tablet    Sig: TAKE 1 TABLETS BY MOUTH TWICE DAILY WITH MEALS    Dispense:  60 tablet    Refill:  0   Vitamin D, Ergocalciferol, (DRISDOL) 1.25 MG (50000 UNIT) CAPS capsule    Sig: Take 1 capsule (50,000 Units total) by mouth every 7 (seven) days.    Dispense:  4 capsule    Refill:  0   topiramate (TOPAMAX) 50 MG tablet    Sig: Take 50 mg at bedtime and increase to 100 mg nightly if tolerated.    Dispense:  60 tablet    Refill:  0   buPROPion (WELLBUTRIN SR) 150 MG 12 hr tablet    Sig: Take 1 tablet (150 mg total) by mouth daily.    Dispense:  30 tablet    Refill:  0    Other depression/emotional eating/Depression with insomnia Brittney Farmer has had issues with stress/emotional eating. Currently this is moderately controlled. Overall mood is stable. Medication(s): Bupropion SR 150 mg daily in am and Topiramate 50-100 mg at bedtime No side effects with these medications. She had been taking Lexapro for depression as well but has stopped taking due to Lexapro causing insomnia and this has improved after stopping lexapro.   Plan: Brittney Farmer discontinued Lexapro due to sleep disturbances and is currently on bupropion and topiramate, which she tolerates well. - Refill bupropion prescription and  topiramate Meds ordered this encounter  Medications   Dulaglutide (TRULICITY) 1.5 MG/0.5ML SOAJ    Sig: Inject 1.5 mg into the skin once a week.    Dispense:  2 mL    Refill:  0   metFORMIN (GLUCOPHAGE) 500 MG tablet    Sig: TAKE 1 TABLETS BY MOUTH TWICE DAILY WITH MEALS    Dispense:  60 tablet    Refill:  0   Vitamin D, Ergocalciferol, (DRISDOL) 1.25 MG (50000 UNIT) CAPS capsule    Sig: Take 1 capsule (50,000 Units total) by mouth every 7 (seven) days.    Dispense:  4 capsule    Refill:  0   topiramate (TOPAMAX) 50 MG tablet    Sig: Take 50 mg at bedtime and increase to 100 mg nightly if tolerated.    Dispense:  60 tablet    Refill:  0   buPROPion (WELLBUTRIN SR) 150 MG 12 hr tablet    Sig: Take 1 tablet (150 mg total) by mouth daily.  Dispense:  30 tablet    Refill:  0    Vitamin D Deficiency Brittney Farmer is on ergocalciferol 50,000 units weekly for vitamin D deficiency without new issues. Last vitamin D Lab Results  Component Value Date   VD25OH 45.4 01/31/2023   Low vitamin D levels can be associated with adiposity and may result in leptin resistance and weight gain. Also associated with fatigue.  Currently on vitamin D supplementation without any adverse effects such as nausea, vomiting or muscle weakness.  - Continue/refill ergocalciferol 50,000 units weekly  Follow-up Brittney Farmer has a follow-up appointment scheduled for April 15th at 9 AM. Future appointments are planned for May 13th and June 3rd. Her response to the increased Trulicity dose will be assessed, and lab work may be considered in future visits to evaluate insulin resistance. - Follow-up appointment on April 15th at 9 AM - Schedule follow-up appointments for May 13th and June 3rd - Consider lab work in future visits to evaluate insulin resistance  Vitals Temp: 97.8 F (36.6 C) BP: 113/75 Pulse Rate: 75 SpO2: 97 %   Anthropometric Measurements Height: 5\' 3"  (1.6 m) Weight: 221 lb (100.2 kg) BMI  (Calculated): 39.16 Weight at Last Visit: 225 lb Weight Lost Since Last Visit: 4 lb Weight Gained Since Last Visit: 0 Starting Weight: 235 lb Total Weight Loss (lbs): 36 lb (16.3 kg) Peak Weight: 256 lb   Body Composition  Body Fat %: 43.6 % Fat Mass (lbs): 96.6 lbs Muscle Mass (lbs): 118.6 lbs Total Body Water (lbs): 80.6 lbs Visceral Fat Rating : 11   Other Clinical Data Fasting: yes Labs: yes Today's Visit #: 1 Starting Date: 02/20/20     ASSESSMENT AND PLAN:  Diet: Brittney Farmer is currently in the action stage of change. As such, her goal is to continue with weight loss efforts and has agreed to the Category 2 Plan and keeping a food journal and adhering to recommended goals of 1200-1400 calories and 85 grams of protein.   Exercise:  For substantial health benefits, adults should do at least 150 minutes (2 hours and 30 minutes) a week of moderate-intensity, or 75 minutes (1 hour and 15 minutes) a week of vigorous-intensity aerobic physical activity, or an equivalent combination of moderate- and vigorous-intensity aerobic activity. Aerobic activity should be performed in episodes of at least 10 minutes, and preferably, it should be spread throughout the week. and Adults should also include muscle-strengthening activities that involve all major muscle groups on 2 or more days a week.  Behavior Modification:  We discussed the following Behavioral Modification Strategies today: increasing lean protein intake, decreasing simple carbohydrates, increasing vegetables, increase H2O intake, increase high fiber foods, emotional eating strategies , avoiding temptations, and planning for success. We discussed various medication options to help Brittney Farmer with her weight loss efforts and we both agreed to increase Trulicity to 1.5 mg weekly and continue all other medications.  Return in about 4 weeks (around 08/15/2023).Marland Kitchen She was informed of the importance of frequent follow up visits to  maximize her success with intensive lifestyle modifications for her multiple health conditions.  Attestation Statements:   Reviewed by clinician on day of visit: allergies, medications, problem list, medical history, surgical history, family history, social history, and previous encounter notes.   Time spent on visit including pre-visit chart review and post-visit care and charting was 39 minutes  Kaoir Loree,PA-C

## 2023-07-18 ENCOUNTER — Encounter (INDEPENDENT_AMBULATORY_CARE_PROVIDER_SITE_OTHER): Payer: Self-pay | Admitting: Physician Assistant

## 2023-07-18 ENCOUNTER — Other Ambulatory Visit (INDEPENDENT_AMBULATORY_CARE_PROVIDER_SITE_OTHER): Payer: Self-pay | Admitting: Physician Assistant

## 2023-07-18 ENCOUNTER — Ambulatory Visit (INDEPENDENT_AMBULATORY_CARE_PROVIDER_SITE_OTHER): Payer: 59 | Admitting: Physician Assistant

## 2023-07-18 VITALS — BP 113/75 | HR 75 | Temp 97.8°F | Ht 63.0 in | Wt 221.0 lb

## 2023-07-18 DIAGNOSIS — F5089 Other specified eating disorder: Secondary | ICD-10-CM | POA: Diagnosis not present

## 2023-07-18 DIAGNOSIS — F331 Major depressive disorder, recurrent, moderate: Secondary | ICD-10-CM

## 2023-07-18 DIAGNOSIS — E88819 Insulin resistance, unspecified: Secondary | ICD-10-CM

## 2023-07-18 DIAGNOSIS — E559 Vitamin D deficiency, unspecified: Secondary | ICD-10-CM | POA: Diagnosis not present

## 2023-07-18 DIAGNOSIS — E669 Obesity, unspecified: Secondary | ICD-10-CM

## 2023-07-18 DIAGNOSIS — Z6839 Body mass index (BMI) 39.0-39.9, adult: Secondary | ICD-10-CM

## 2023-07-18 DIAGNOSIS — F32A Depression, unspecified: Secondary | ICD-10-CM | POA: Diagnosis not present

## 2023-07-18 MED ORDER — TRULICITY 1.5 MG/0.5ML ~~LOC~~ SOAJ: 1.5000 mg | SUBCUTANEOUS | 0 refills | Status: DC

## 2023-07-18 MED ORDER — VITAMIN D (ERGOCALCIFEROL) 1.25 MG (50000 UNIT) PO CAPS: 50000.0000 [IU] | ORAL_CAPSULE | ORAL | 0 refills | Status: DC

## 2023-07-18 MED ORDER — METFORMIN HCL 500 MG PO TABS: ORAL_TABLET | ORAL | 0 refills | Status: DC

## 2023-07-18 MED ORDER — TOPIRAMATE 50 MG PO TABS
ORAL_TABLET | ORAL | 0 refills | Status: AC
Start: 2023-07-18 — End: ?

## 2023-07-18 MED ORDER — BUPROPION HCL ER (SR) 150 MG PO TB12: 150.0000 mg | ORAL_TABLET | Freq: Every day | ORAL | 0 refills | Status: DC

## 2023-08-13 ENCOUNTER — Other Ambulatory Visit (INDEPENDENT_AMBULATORY_CARE_PROVIDER_SITE_OTHER): Payer: Self-pay | Admitting: Physician Assistant

## 2023-08-13 DIAGNOSIS — E559 Vitamin D deficiency, unspecified: Secondary | ICD-10-CM

## 2023-08-14 NOTE — Progress Notes (Unsigned)
 SUBJECTIVE: Discussed the use of AI scribe software for clinical note transcription with the patient, who gave verbal consent to proceed.  Chief Complaint: Obesity  Interim History: She is up 2 lbs since last visit.  Down 33 lbs from peak weight TBW loss of 12.9% from peak weight Down 12 lbs since starting HWW TBW loss of  5.1%  Brittney Farmer is here to discuss her progress with her obesity treatment plan. She is on the Category 2 Plan and states she is following her eating plan approximately 50 % of the time. She states she is not exercising.  Brittney Farmer is a 38 year old female with obesity, insulin resistance, and hyperlipidemia who presents for a follow-up on her obesity treatment plan.  She has been managing her obesity with Trulicity 1.5 mg weekly for insulin resistance. She experiences some appetite suppression/decreased cravings and continues metformin 500 mg twice daily, though she occasionally misses doses due to her busy schedule. Taking metformin with food improves her tolerance. No nausea, vomiting, constipation, diarrhea, difficulty swallowing, lump in the throat, vision changes, or mood changes since starting Trulicity.  She is on vitamin D supplementation and requires a refill. She takes bupropion (Wellbutrin) but does not need a refill. Topiramate (Topamax) is used for migraines as well as emotional eating/cravings,  but is not taken regularly at night. She has not been taking Lexapro and reports sleep issues, waking up with racing thoughts, and poorly managed anxiety and depression. She plans to see her PCP in follow up for her anxiety and depression management.   She experiences low energy levels and has not been exercising regularly due to weather conditions. Previously, she walked two to three miles during lunch breaks, which she found beneficial. She acknowledges not drinking enough water and aims to increase her intake to at least 80 ounces daily.  She is planning a trip  to the beach in June and her son's sixteenth birthday party in May.   Fasting labs obtained today The patient was informed we would discuss the lab results at the next visit unless there is a critical issue that needs to be addressed sooner. The patient agreed to keep the next visit at the agreed upon time to discuss these results.   OBJECTIVE: Visit Diagnoses: Problem List Items Addressed This Visit     Obesity Start BMI 41.63 Date 02/20/20   Relevant Medications   Dulaglutide (TRULICITY) 3 MG/0.5ML SOAJ   Other hyperlipidemia   Relevant Orders   Lipid Panel With LDL/HDL Ratio   Vitamin D deficiency   Relevant Medications   Vitamin D, Ergocalciferol, (DRISDOL) 1.25 MG (50000 UNIT) CAPS capsule   Other Relevant Orders   VITAMIN D 25 Hydroxy (Vit-D Deficiency, Fractures)   B12 deficiency   Relevant Orders   Vitamin B12   CBC with Differential/Platelet   Insulin resistance - Primary   Relevant Medications   Dulaglutide (TRULICITY) 3 MG/0.5ML SOAJ   Other Relevant Orders   CMP14+EGFR   Hemoglobin A1c   Insulin, random   TSH   Eating disorder   Other Visit Diagnoses       BMI 39.0-39.9,adult Current BMI 39.6         Obesity She is being treated for obesity with insulin resistance, vitamin D deficiency, hyperlipidemia, and emotional eating. Currently on Trulicity 1.5 mg weekly and metformin 500 mg twice daily. Reports no significant side effects from Trulicity but feels it is not working effectively. Experiences some appetite suppression and is not hungry at  night. Not consistently taking metformin twice daily due to a busy schedule and gastrointestinal discomfort when taken without food. Importance of taking metformin with food was discussed. Encouraged to increase physical activity and water intake. Benefits of high-intensity interval training (HIIT) and increased water intake were discussed. - Increase Trulicity to 3 mg weekly, with the possibility of further increasing to 4.5  mg if needed. - Continue metformin 500 mg twice daily with food. - Encourage consistent medication adherence. - Encourage regular physical activity, including walking and HIIT exercises. - Increase water intake to at least 80 ounces daily. Meds ordered this encounter  Medications   Dulaglutide (TRULICITY) 3 MG/0.5ML SOAJ    Sig: Inject 3 mg as directed once a week.    Dispense:  2 mL    Refill:  0   Vitamin D, Ergocalciferol, (DRISDOL) 1.25 MG (50000 UNIT) CAPS capsule    Sig: Take 1 capsule (50,000 Units total) by mouth every 7 (seven) days.    Dispense:  4 capsule    Refill:  0    Vitamin D Deficiency Requires ongoing vitamin D supplementation- Ergocalciferol 50,000 units once weekly. Reports no N/V or muscle weakness with Ergocalciferol. Reports needing a refill for her vitamin D prescription. Last vitamin D Lab Results  Component Value Date   VD25OH 45.4 01/31/2023  Low vitamin D levels can be associated with adiposity and may result in leptin resistance and weight gain. Also associated with fatigue.  Currently on vitamin D supplementation without any adverse effects such as nausea, vomiting or muscle weakness.  Recheck vitamin D level today.  - Refill vitamin D prescription.  Hyperlipidemia LDL is not at goal. Medication(s): Not on statin Cardiovascular risk factors: dyslipidemia, obesity (BMI >= 30 kg/m2), and sedentary lifestyle  Lab Results  Component Value Date   CHOL 224 (H) 01/31/2023   HDL 46 01/31/2023   LDLCALC 165 (H) 01/31/2023   TRIG 72 01/31/2023   CHOLHDL 3.4 07/12/2022   CHOLHDL 4.8 (H) 05/10/2022   CHOLHDL 3.8 11/11/2021   Lab Results  Component Value Date   ALT 11 01/31/2023   AST 11 01/31/2023   ALKPHOS 111 01/31/2023   BILITOT 0.4 01/31/2023   The ASCVD Risk score (Arnett DK, et al., 2019) failed to calculate for the following reasons:   The 2019 ASCVD risk score is only valid for ages 98 to 90  Plan: Continue to work on Engineer, technical sales  -decreasing simple carbohydrates, increasing lean proteins, decreasing saturated fats and cholesterol , avoiding trans fats and exercise as able to promote weight loss, improve lipids and decrease cardiovascular risks. Recheck fasting lipids today  History of low B 12 Endorses fatigue.  Lab Results  Component Value Date   VITAMINB12 801 01/31/2023   Plan: Recheck CBC and B 12 in addition to other labs today and supplement if indicated.    Other depression/emotional eating Brittney Farmer has had issues with stress/emotional eating. Currently this is moderately controlled. Overall mood is stable. Medication(s): Bupropion SR 150 mg daily in am, Topiramate 50 -100 mg daily, and Other: Stopped Lexapro  She is experiencing difficulty sleeping and plans to follow up with PCP.  Plan:  Follow up with PCP concerning medication for depression and anxiety.   Migraine Taking topiramate (Topamax) for migraine management and reports it is effective in controlling her migraines. - Continue topiramate for migraine management.  Anxiety and Depression Reports not taking Lexapro and experiencing anxiety, depression, and insomnia. Plans to see her provider, Brittney Rodney, FNP, for further  management. Has not taken Restoril due to concerns about side effects experienced with previous medications. - Schedule an appointment with PCP- Brittney Fragmin, NP Continue usual medications .   Follow-up Due for routine blood work to monitor her health status, including the effects of Trulicity on her A1c and insulin levels. - Order blood tests: B12, CBC, chemistry panel, liver and kidney function tests, A1c, insulin level, lipids, and TSH. - Schedule follow-up appointments on May 13th and June 3rd. - Discuss lab results with her before the next visit if any abnormalities are found.  Vitals Temp: 97.7 F (36.5 C) BP: 104/70 Pulse Rate: 78 SpO2: 99 %   Anthropometric Measurements Height: 5\' 3"  (1.6 m) Weight: 223 lb  (101.2 kg) BMI (Calculated): 39.51 Weight at Last Visit: 221 lb Weight Lost Since Last Visit: 0 Weight Gained Since Last Visit: 2 lb Starting Weight: 235 lb Total Weight Loss (lbs): 12 lb (5.443 kg) Peak Weight: 256 lb   Body Composition  Body Fat %: 44.5 % Fat Mass (lbs): 99.6 lbs Muscle Mass (lbs): 117.8 lbs Total Body Water (lbs): 82 lbs Visceral Fat Rating : 11   Other Clinical Data Fasting: yes Labs: no Today's Visit #: 39 Starting Date: 02/20/20     ASSESSMENT AND PLAN:  Diet: Brittney Farmer is currently in the action stage of change. As such, her goal is to continue with weight loss efforts. She has agreed to Category 2 Plan.  Exercise: Brittney Farmer has been instructed to work up to a goal of 150 minutes of combined cardio and strengthening exercise per week for weight loss and overall health benefits.   Behavior Modification:  We discussed the following Behavioral Modification Strategies today: increasing lean protein intake, decreasing simple carbohydrates, increasing vegetables, increase H2O intake, increase high fiber foods, meal planning and cooking strategies, emotional eating strategies , avoiding temptations, and planning for success. We discussed various medication options to help Jovita with her weight loss efforts and we both agreed to increase trulicity to 3 mg weekly for insulin resistance and continue to work on nutritional and behavioral strategies to promote weight loss.  .  Return in about 4 weeks (around 09/12/2023).Brittney Farmer She was informed of the importance of frequent follow up visits to maximize her success with intensive lifestyle modifications for her multiple health conditions.  Attestation Statements:   Reviewed by clinician on day of visit: allergies, medications, problem list, medical history, surgical history, family history, social history, and previous encounter notes.   Time spent on visit including pre-visit chart review and post-visit care and  charting was 35 minutes.    Chanc Kervin, PA-C

## 2023-08-15 ENCOUNTER — Ambulatory Visit (INDEPENDENT_AMBULATORY_CARE_PROVIDER_SITE_OTHER): Payer: 59 | Admitting: Physician Assistant

## 2023-08-15 ENCOUNTER — Encounter (INDEPENDENT_AMBULATORY_CARE_PROVIDER_SITE_OTHER): Payer: Self-pay | Admitting: Physician Assistant

## 2023-08-15 VITALS — BP 104/70 | HR 78 | Temp 97.7°F | Ht 63.0 in | Wt 223.0 lb

## 2023-08-15 DIAGNOSIS — E7849 Other hyperlipidemia: Secondary | ICD-10-CM

## 2023-08-15 DIAGNOSIS — E88819 Insulin resistance, unspecified: Secondary | ICD-10-CM | POA: Diagnosis not present

## 2023-08-15 DIAGNOSIS — F5089 Other specified eating disorder: Secondary | ICD-10-CM

## 2023-08-15 DIAGNOSIS — E559 Vitamin D deficiency, unspecified: Secondary | ICD-10-CM

## 2023-08-15 DIAGNOSIS — E538 Deficiency of other specified B group vitamins: Secondary | ICD-10-CM

## 2023-08-15 DIAGNOSIS — E669 Obesity, unspecified: Secondary | ICD-10-CM | POA: Diagnosis not present

## 2023-08-15 DIAGNOSIS — Z6839 Body mass index (BMI) 39.0-39.9, adult: Secondary | ICD-10-CM

## 2023-08-15 MED ORDER — VITAMIN D (ERGOCALCIFEROL) 1.25 MG (50000 UNIT) PO CAPS: 50000.0000 [IU] | ORAL_CAPSULE | ORAL | 0 refills | Status: DC

## 2023-08-15 MED ORDER — TRULICITY 3 MG/0.5ML ~~LOC~~ SOAJ: 3.0000 mg | SUBCUTANEOUS | 0 refills | Status: DC

## 2023-08-16 ENCOUNTER — Encounter (INDEPENDENT_AMBULATORY_CARE_PROVIDER_SITE_OTHER): Payer: Self-pay | Admitting: Physician Assistant

## 2023-08-16 LAB — CBC WITH DIFFERENTIAL/PLATELET
Basophils Absolute: 0.1 10*3/uL (ref 0.0–0.2)
Basos: 1 %
EOS (ABSOLUTE): 0.1 10*3/uL (ref 0.0–0.4)
Eos: 1 %
Hematocrit: 41.1 % (ref 34.0–46.6)
Hemoglobin: 13.8 g/dL (ref 11.1–15.9)
Immature Grans (Abs): 0 10*3/uL (ref 0.0–0.1)
Immature Granulocytes: 0 %
Lymphocytes Absolute: 1.9 10*3/uL (ref 0.7–3.1)
Lymphs: 27 %
MCH: 29.9 pg (ref 26.6–33.0)
MCHC: 33.6 g/dL (ref 31.5–35.7)
MCV: 89 fL (ref 79–97)
Monocytes Absolute: 0.4 10*3/uL (ref 0.1–0.9)
Monocytes: 6 %
Neutrophils Absolute: 4.6 10*3/uL (ref 1.4–7.0)
Neutrophils: 65 %
Platelets: 322 10*3/uL (ref 150–450)
RBC: 4.62 x10E6/uL (ref 3.77–5.28)
RDW: 12.6 % (ref 11.7–15.4)
WBC: 7 10*3/uL (ref 3.4–10.8)

## 2023-08-16 LAB — CMP14+EGFR
ALT: 6 IU/L (ref 0–32)
AST: 10 IU/L (ref 0–40)
Albumin: 4.2 g/dL (ref 3.9–4.9)
Alkaline Phosphatase: 130 IU/L — ABNORMAL HIGH (ref 44–121)
BUN/Creatinine Ratio: 11 (ref 9–23)
BUN: 8 mg/dL (ref 6–20)
Bilirubin Total: 0.3 mg/dL (ref 0.0–1.2)
CO2: 23 mmol/L (ref 20–29)
Calcium: 8.9 mg/dL (ref 8.7–10.2)
Chloride: 103 mmol/L (ref 96–106)
Creatinine, Ser: 0.71 mg/dL (ref 0.57–1.00)
Globulin, Total: 2.4 g/dL (ref 1.5–4.5)
Glucose: 76 mg/dL (ref 70–99)
Potassium: 4.5 mmol/L (ref 3.5–5.2)
Sodium: 140 mmol/L (ref 134–144)
Total Protein: 6.6 g/dL (ref 6.0–8.5)
eGFR: 114 mL/min/{1.73_m2} (ref >59)

## 2023-08-16 LAB — LIPID PANEL WITH LDL/HDL RATIO
Cholesterol, Total: 196 mg/dL (ref 100–199)
HDL: 50 mg/dL (ref >39)
LDL Chol Calc (NIH): 135 mg/dL — ABNORMAL HIGH (ref 0–99)
LDL/HDL Ratio: 2.7 ratio (ref 0.0–3.2)
Triglycerides: 62 mg/dL (ref 0–149)
VLDL Cholesterol Cal: 11 mg/dL (ref 5–40)

## 2023-08-16 LAB — VITAMIN B12: Vitamin B-12: 637 pg/mL (ref 232–1245)

## 2023-08-16 LAB — INSULIN, RANDOM: INSULIN: 5.7 u[IU]/mL (ref 2.6–24.9)

## 2023-08-16 LAB — HEMOGLOBIN A1C
Est. average glucose Bld gHb Est-mCnc: 97 mg/dL
Hgb A1c MFr Bld: 5 % (ref 4.8–5.6)

## 2023-08-16 LAB — VITAMIN D 25 HYDROXY (VIT D DEFICIENCY, FRACTURES): Vit D, 25-Hydroxy: 88.5 ng/mL (ref 30.0–100.0)

## 2023-08-16 LAB — TSH: TSH: 1.26 u[IU]/mL (ref 0.450–4.500)

## 2023-08-16 NOTE — Telephone Encounter (Signed)
 Call to patient and made her aware to stop taking her Vitamin D. Patient agreed and will stop taking.  She did mention that she had started using a tanning salon lately.

## 2023-08-19 ENCOUNTER — Other Ambulatory Visit: Payer: Self-pay | Admitting: Family

## 2023-08-19 DIAGNOSIS — R002 Palpitations: Secondary | ICD-10-CM

## 2023-08-19 DIAGNOSIS — R079 Chest pain, unspecified: Secondary | ICD-10-CM

## 2023-08-19 DIAGNOSIS — F411 Generalized anxiety disorder: Secondary | ICD-10-CM

## 2023-09-12 ENCOUNTER — Ambulatory Visit (INDEPENDENT_AMBULATORY_CARE_PROVIDER_SITE_OTHER): Admitting: Physician Assistant

## 2023-09-12 ENCOUNTER — Encounter (INDEPENDENT_AMBULATORY_CARE_PROVIDER_SITE_OTHER): Payer: Self-pay | Admitting: Physician Assistant

## 2023-09-12 VITALS — BP 112/74 | HR 74 | Temp 98.0°F | Ht 63.0 in | Wt 217.0 lb

## 2023-09-12 DIAGNOSIS — R5383 Other fatigue: Secondary | ICD-10-CM | POA: Diagnosis not present

## 2023-09-12 DIAGNOSIS — F32A Depression, unspecified: Secondary | ICD-10-CM

## 2023-09-12 DIAGNOSIS — E88819 Insulin resistance, unspecified: Secondary | ICD-10-CM | POA: Diagnosis not present

## 2023-09-12 DIAGNOSIS — Z6838 Body mass index (BMI) 38.0-38.9, adult: Secondary | ICD-10-CM | POA: Diagnosis not present

## 2023-09-12 DIAGNOSIS — E559 Vitamin D deficiency, unspecified: Secondary | ICD-10-CM

## 2023-09-12 DIAGNOSIS — F419 Anxiety disorder, unspecified: Secondary | ICD-10-CM | POA: Diagnosis not present

## 2023-09-12 DIAGNOSIS — E669 Obesity, unspecified: Secondary | ICD-10-CM | POA: Diagnosis not present

## 2023-09-12 DIAGNOSIS — F331 Major depressive disorder, recurrent, moderate: Secondary | ICD-10-CM

## 2023-09-12 MED ORDER — TRULICITY 3 MG/0.5ML ~~LOC~~ SOAJ: 3.0000 mg | SUBCUTANEOUS | 0 refills | Status: DC

## 2023-09-12 MED ORDER — VITAMIN D (ERGOCALCIFEROL) 1.25 MG (50000 UNIT) PO CAPS
50000.0000 [IU] | ORAL_CAPSULE | ORAL | 0 refills | Status: DC
Start: 2023-09-12 — End: 2023-11-14

## 2023-09-12 NOTE — Progress Notes (Signed)
 SUBJECTIVE: Discussed the use of AI scribe software for clinical note transcription with the patient, who gave verbal consent to proceed.  Chief Complaint: Obesity  Interim History: She is down 6 lbs since last visit.  Down 18 lbs overall TBW loss of 7.7%  Brittney Farmer is here to discuss her progress with her obesity treatment plan. She is on the Category 2 Plan and states she is following her eating plan approximately 50 % of the time. She states she is exercising walking 60 minutes 2-3 times per week. Brittney Farmer is a 38 year old female with obesity and prediabetes who presents for follow-up of her obesity treatment plan.  She had been experiencing cravings and emotional eating. Recently, she started on Trulicity  in addition to metformin , which appears to be aiding in weight loss. She follows her category two plan at least fifty percent of the time and engages in walking for sixty minutes two to three times per week. She has lost six pounds since her last visit.  She feels less hungry and sometimes needs to remind herself to eat, particularly at supper time. She continues to consume breakfast shakes and attempts to eat lunch, often a salad. However, she sometimes skips supper due to late work hours and her children's activities. She is concerned about eating late but acknowledges the importance of maintaining her metabolism.We discussed the importance of regular meals and adequate protein intake for weight loss and overall health.   She reports feeling 'draggy' on certain days and has experienced headaches over the last two days.? some of this fatigue is related to Trulicity , noting grogginess the day after her injection. She has stopped taking some of her anxiety medication to improve her sleep, which she finds beneficial for her mental health. Her sleep has improved, although she occasionally wakes up during the night.  She is currently taking metformin  once a day and has been inconsistent  with her topiramate  use, taking it sporadically. She is not currently taking bupropion . Her vitamin D  levels have improved from 45 to 88, and we discussed resuming vitamin D  supplementation every other week as she feels her energy decreased while she was off vitamin D  over the past month. Her B12 level is 637, and she occasionally takes a B12 supplement. She avoids milk due to gastrointestinal discomfort but consumes cheese and occasionally eggs.  She is planning a trip to the beach and is concerned about maintaining her diet during this time, especially with temptations like ice cream. She plans to grill out and maintain her protein intake with options like Lean Cuisines and protein shakes. She does not consume alcohol as it does not agree with her.  OBJECTIVE: Visit Diagnoses: Problem List Items Addressed This Visit     Obesity Start BMI 41.63 Date 02/20/20   Relevant Medications   Dulaglutide  (TRULICITY ) 3 MG/0.5ML SOAJ   Other fatigue   Vitamin D  deficiency   Relevant Medications   Vitamin D , Ergocalciferol , (DRISDOL ) 1.25 MG (50000 UNIT) CAPS capsule   Depression   Insulin  resistance - Primary   Relevant Medications   Dulaglutide  (TRULICITY ) 3 MG/0.5ML SOAJ   BMI 38.0-38.9,adult Current BMI 38.8   Obesity Obesity management with recent initiation of Trulicity  in addition to metformin . Reports decreased appetite and weight loss of 6 pounds. BMI reduced to 38. Adipose tissue decreased by 3.4 pounds. Adheres to category two plan at least 50% of the time. Engages in walking 60 minutes 2-3 times per week. Discussed challenges with meal timing due  to family schedule and strategies to maintain metabolism. - Continue Trulicity  and metformin  as prescribed. - Encourage adherence to category two plan and regular physical activity. - Advise on meal timing to prevent metabolic slowdown. - Discuss options for quick, healthy meals such as Lean Cuisines and protein shakes.   Insulin   Resistance Last fasting insulin  was 5.7- much improved. A1c was 5.0- improved. Polyphagia:No Medication(s): Trulicity  3.0 mg SQ weekly Denies mass in neck, dysphagia, dyspepsia, persistent hoarseness, abdominal pain, or N/V/Constipation or diarrhea. Has annual eye exam. Mood is stable.   Lab Results  Component Value Date   HGBA1C 5.0 08/15/2023   HGBA1C 5.3 01/31/2023   HGBA1C 5.3 05/10/2022   HGBA1C 5.2 11/11/2021   HGBA1C 5.1 02/20/2020   Lab Results  Component Value Date   INSULIN  5.7 08/15/2023   INSULIN  12.3 01/31/2023   INSULIN  10.0 05/10/2022   INSULIN  12.5 11/11/2021   INSULIN  5.2 02/20/2020    Plan: Continue and refill Trulicity  3.0 mg SQ weekly Continue working on nutrition plan to decrease simple carbohydrates, increase lean proteins and exercise to promote weight loss, improve glycemic control and prevent progression to Type 2 diabetes.  Meds ordered this encounter  Medications   Vitamin D , Ergocalciferol , (DRISDOL ) 1.25 MG (50000 UNIT) CAPS capsule    Sig: Take 1 capsule (50,000 Units total) by mouth every 14 (fourteen) days.    Dispense:  4 capsule    Refill:  0   Dulaglutide  (TRULICITY ) 3 MG/0.5ML SOAJ    Sig: Inject 3 mg as directed once a week.    Dispense:  2 mL    Refill:  0    Fatigue Intermittent fatigue possibly related to Trulicity , as GLP-1 agonists can cause fatigue. Reports feeling groggy, especially the day after Trulicity  injection. Vitamin D  deficiency and B12 levels considered as contributing factors. B12 level is adequate at 637, but vitamin D  was previously low but then increased greatly and was held for the past month. Fatigue may improve as she adjusts to the medication. - Refill vitamin D  and adjust to every other week dosing. - Monitor fatigue symptoms and consider Trulicity  as a potential cause. - Encourage over-the-counter B12 or B complex supplement if desired.  Vitamin D  Deficiency Vitamin D  is at goal of 50.  Most recent vitamin D   level was 88.5. She is on stopped vitamin D  supplementation as level had increased significantly. Lab Results  Component Value Date   VD25OH 88.5 08/15/2023   VD25OH 45.4 01/31/2023   VD25OH 27.7 (L) 05/10/2022    Plan: Change to  prescription ergocalciferol  50,000 IU every 14 days Monitor energy levels.  Low vitamin D  levels can be associated with adiposity and may result in leptin resistance and weight gain. Also associated with fatigue.  Currently on vitamin D  supplementation without any adverse effects such as nausea, vomiting or muscle weakness.  Meds ordered this encounter  Medications   Vitamin D , Ergocalciferol , (DRISDOL ) 1.25 MG (50000 UNIT) CAPS capsule    Sig: Take 1 capsule (50,000 Units total) by mouth every 14 (fourteen) days.    Dispense:  4 capsule    Refill:  0   Dulaglutide  (TRULICITY ) 3 MG/0.5ML SOAJ    Sig: Inject 3 mg as directed once a week.    Dispense:  2 mL    Refill:  0     Depression and anxiety Depression and anxiety with cessation of bupropion  and inconsistent use of topiramate . Reports improved sleep since stopping anxiety medication, prioritizing sleep over medication adherence.  Discussed the importance of sleep for mental health and resilience. Acknowledged that stopping medication may lead to increased anxiety or depression, but prioritizes sleep at this time. - Monitor mental health symptoms and consider resuming bupropion  if anxiety or depression worsens. - Encourage consistent sleep patterns to support mental health. Vitals Temp: 98 F (36.7 C) BP: 112/74 Pulse Rate: 74 SpO2: 100 %   Anthropometric Measurements Height: 5\' 3"  (1.6 m) Weight: 217 lb (98.4 kg) BMI (Calculated): 38.45 Weight at Last Visit: 221 lb Weight Lost Since Last Visit: 6 lb Weight Gained Since Last Visit: 0 lb Starting Weight: 235lb Total Weight Loss (lbs): 18 lb (8.165 kg) Peak Weight: 256 lb   Body Composition  Body Fat %: 43.8 % Fat Mass (lbs): 95.2  lbs Muscle Mass (lbs): 116 lbs Total Body Water  (lbs): 80.2 lbs Visceral Fat Rating : 11   Other Clinical Data Fasting: no Labs: no Today's Visit #: 40 Starting Date: 02/20/20     ASSESSMENT AND PLAN:  Diet: Maybree is currently in the action stage of change. As such, her goal is to continue with weight loss efforts. She has agreed to Category 2 Plan.  Exercise: Skyanne has been instructed to work up to a goal of 150 minutes of combined cardio and strengthening exercise per week for weight loss and overall health benefits.   Behavior Modification:  We discussed the following Behavioral Modification Strategies today: increasing lean protein intake, decreasing simple carbohydrates, increasing vegetables, increase H2O intake, increase high fiber foods, no skipping meals, emotional eating strategies , avoiding temptations, and planning for success. We discussed various medication options to help Kaydee with her weight loss efforts and we both agreed to continue Trulicity  3 mg weekly for primary indication of insulin  resistance and continue to work on nutritional and behavioral strategies to promote weight loss.  .  Return in about 4 weeks (around 10/10/2023).Aaron Aas She was informed of the importance of frequent follow up visits to maximize her success with intensive lifestyle modifications for her multiple health conditions.  Attestation Statements:   Reviewed by clinician on day of visit: allergies, medications, problem list, medical history, surgical history, family history, social history, and previous encounter notes.   Time spent on visit including pre-visit chart review and post-visit care and charting was 30 minutes.    Rasean Joos, PA-C

## 2023-09-16 ENCOUNTER — Other Ambulatory Visit (INDEPENDENT_AMBULATORY_CARE_PROVIDER_SITE_OTHER): Payer: Self-pay | Admitting: Physician Assistant

## 2023-09-16 DIAGNOSIS — E88819 Insulin resistance, unspecified: Secondary | ICD-10-CM

## 2023-10-02 NOTE — Progress Notes (Signed)
 SUBJECTIVE: Discussed the use of AI scribe software for clinical note transcription with the patient, who gave verbal consent to proceed.  Chief Complaint: Obesity  Interim History: She is up 1 lb since her last visit.  Brittney Farmer is here to discuss her progress with her obesity treatment plan. She is on the Category 2 Plan and states she is following her eating plan approximately 50 % of the time. She states she is exercising 60 minutes 2 times per week by walking. Brittney Farmer is a 38 year old female with obesity who presents for follow-up of her obesity treatment plan.  She has been on Trulicity  3 mg weekly and metformin  500 mg twice daily for insulin  resistance. The current dose of Trulicity  is not providing sufficient appetite suppression, especially after the initial days post-injection. She recently consumed cupcakes and cake during a birthday celebration, which was off her usual plan.  She is also taking topiramate  for craving and emotional eating. Taking topiramate  in the morning causes daytime sleepiness and increased alertness at night. She has been taking B12 to help with energy levels, which improved when she temporarily stopped topiramate .  For her vitamin D  deficiency, she is taking ergocalciferol  50,000 units every 14 days. She has enough supply and does not need a refill at this time.  She recently got a piece of metal stuck in her finger, causing pain. This occurred while handling a blown-out tire at work, which had exposed wires.  She is planning a trip to the beach and intends to maintain her dietary plan by staying in a camper with a kitchen. She has prepared by Materials engineer for breakfast and plans to limit eating out, though she anticipates indulging in ice cream occasionally.  OBJECTIVE: Visit Diagnoses: Problem List Items Addressed This Visit     Obesity Start BMI 41.63 Date 02/20/20   Relevant Medications   Dulaglutide  (TRULICITY ) 4.5 MG/0.5ML SOAJ    Vitamin D  deficiency   B12 deficiency   Insulin  resistance - Primary   Relevant Medications   Dulaglutide  (TRULICITY ) 4.5 MG/0.5ML SOAJ   BMI 38.0-38.9,adult Current BMI 38.8   Other Visit Diagnoses       Nausea       Relevant Medications   ondansetron  (ZOFRAN ) 4 MG tablet     Obesity She is on Trulicity  3 mg weekly and metformin  500 mg BID for insulin  resistance. Reports insufficient appetite suppression with current Trulicity  dose. Plans to increase Trulicity  to 4.5 mg for better appetite control. Advised to avoid fatty foods to minimize nausea, a common side effect. She is also on topiramate  for craving and emotional eating, planning to adjust to nighttime dosing to improve sleep and reduce daytime fatigue. Discussed potential for increased nausea with new Trulicity  dose and use of Zofran  as needed. - Increase Trulicity  to 4.5 mg weekly - Advise to avoid fatty foods with new Trulicity  dose - Refill Zofran  for nausea - Adjust topiramate  dosing to nighttime - Consider taking topiramate  50 mg at dinner and 50 mg at bedtime  Insulin  Resistance She is on metformin  500 mg BID and Trulicity  3 mg weekly. Reports sufficient supply and no need for refill of metformin . No GI or other side effects with metformin .  Reports decreased appetite suppression/decreased craving suppression with current Trulicity  dose. Denies mass in neck, dysphagia, dyspepsia, persistent hoarseness, abdominal pain, or N/V/Constipation or diarrhea. Has annual eye exam. Mood is stable.  Some mild nausea when we have increased Trulicity  doses and requests refill of zofran .  Discussed increasing Trulicity  and she is in agreement with increase at this time. . Management is closely tied to obesity treatment plan. Lab Results  Component Value Date   HGBA1C 5.0 08/15/2023   HGBA1C 5.3 01/31/2023   HGBA1C 5.3 05/10/2022   Lab Results  Component Value Date   LDLCALC 135 (H) 08/15/2023   CREATININE 0.71 08/15/2023    INSULIN   Date Value Ref Range Status  08/15/2023 5.7 2.6 - 24.9 uIU/mL Final  ]Continue working on nutrition plan to decrease simple carbohydrates, increase lean proteins and exercise to promote weight loss, improve glycemic control and prevent progression to Type 2 diabetes.  Increase and continue Trulicity  4.5 mg weekly - Continue metformin  500 mg BID- no refill needed.  Refill zofran  for nausea prn.  Plan for labs at [facility] in August.  Meds ordered this encounter  Medications   Dulaglutide  (TRULICITY ) 4.5 MG/0.5ML SOAJ    Sig: Inject 4.5 mg as directed once a week.    Dispense:  2 mL    Refill:  0   ondansetron  (ZOFRAN ) 4 MG tablet    Sig: Take 1 tablet (4 mg total) by mouth every 8 (eight) hours as needed for nausea or vomiting. TAKE 1 TABLET BY MOUTH EVERY 8 HOURS AS NEEDED FOR NAUSEA AND VOMITING    Dispense:  90 tablet    Refill:  2    Hyperlipidemia No specific discussion about current management or changes in treatment during this visit.  Vitamin D  Deficiency She is on ergocalciferol  50,000 units every 14 days. Reports sufficient supply and no need for refill. No N/V or muscle weakness or other side effects with ERgocalciferol .  Last vitamin D  Lab Results  Component Value Date   VD25OH 88.5 08/15/2023   Low vitamin D  levels can be associated with adiposity and may result in leptin resistance and weight gain. Also associated with fatigue.  Currently on vitamin D  supplementation without any adverse effects such as nausea, vomiting or muscle weakness.  - Continue ergocalciferol  50,000 units every 14 days  B12 Deficiency She is taking B12 supplements and reports improved energy levels, especially after adjusting topiramate  dosing. - Continue B12 500 mcg daily. supplementation   Eating disorder/emotional eating Earnestene has had issues with stress/emotional eating. Currently this is moderately controlled. Overall mood is stable. Medication(s): Topiramate  50-100 mg in  PM Was taking topiramate  during day and stopped due to somnolence.  Wants to try in pm and we discussed taking with evening meal to help control cravings in evenings.  Plan: Continue Topiramate  50-100 mg in evenings. She will start with 50 mg and increase as tolerated/needed to 100 mg to help with cravings.  Continue working on emotional eating strategies.  Vitals Temp: 98.1 F (36.7 C) BP: 117/72 Pulse Rate: 70 SpO2: 100 %   Anthropometric Measurements Height: 5\' 3"  (1.6 m) Weight: 218 lb (98.9 kg) BMI (Calculated): 38.63 Weight at Last Visit: 217 lb Weight Lost Since Last Visit: 0 Weight Gained Since Last Visit: 1 lb Starting Weight: 235 lb Total Weight Loss (lbs): 17 lb (7.711 kg) Peak Weight: 256 lb   Body Composition  Body Fat %: 44.7 % Fat Mass (lbs): 97.6 lbs Muscle Mass (lbs): 114.6 lbs Total Body Water  (lbs): 81.2 lbs Visceral Fat Rating : 1   Other Clinical Data Fasting: No Labs: No Today's Visit #: 41 Starting Date: 02/20/20     ASSESSMENT AND PLAN:  Diet: Akia is currently in the action stage of change. As such, her goal is  to continue with weight loss efforts. She has agreed to Category 2 Plan.  Exercise: Zeidy has been instructed to work up to a goal of 150 minutes of combined cardio and strengthening exercise per week for weight loss and overall health benefits.   Behavior Modification:  We discussed the following Behavioral Modification Strategies today: increasing lean protein intake, decreasing simple carbohydrates, increasing vegetables, increase H2O intake, no skipping meals, meal planning and cooking strategies, better snacking choices, emotional eating strategies , avoiding temptations, and planning for success. We discussed various medication options to help Pierrette with her weight loss efforts and we both agreed to increase Trulicity  and continue to work on nutritional and behavioral strategies to promote weight loss.  .  Return in  about 3 weeks (around 10/24/2023).Aaron Aas She was informed of the importance of frequent follow up visits to maximize her success with intensive lifestyle modifications for her multiple health conditions.  Attestation Statements:   Reviewed by clinician on day of visit: allergies, medications, problem list, medical history, surgical history, family history, social history, and previous encounter notes.   Time spent on visit including pre-visit chart review and post-visit care and charting was 36 minutes.    Preston Garabedian, PA-C

## 2023-10-03 ENCOUNTER — Encounter (INDEPENDENT_AMBULATORY_CARE_PROVIDER_SITE_OTHER): Payer: Self-pay | Admitting: Physician Assistant

## 2023-10-03 ENCOUNTER — Ambulatory Visit (INDEPENDENT_AMBULATORY_CARE_PROVIDER_SITE_OTHER): Admitting: Physician Assistant

## 2023-10-03 VITALS — BP 117/72 | HR 70 | Temp 98.1°F | Ht 63.0 in | Wt 218.0 lb

## 2023-10-03 DIAGNOSIS — E559 Vitamin D deficiency, unspecified: Secondary | ICD-10-CM | POA: Diagnosis not present

## 2023-10-03 DIAGNOSIS — E538 Deficiency of other specified B group vitamins: Secondary | ICD-10-CM

## 2023-10-03 DIAGNOSIS — Z6838 Body mass index (BMI) 38.0-38.9, adult: Secondary | ICD-10-CM | POA: Diagnosis not present

## 2023-10-03 DIAGNOSIS — F5089 Other specified eating disorder: Secondary | ICD-10-CM

## 2023-10-03 DIAGNOSIS — E88819 Insulin resistance, unspecified: Secondary | ICD-10-CM

## 2023-10-03 DIAGNOSIS — R110 Nausea: Secondary | ICD-10-CM

## 2023-10-03 DIAGNOSIS — E669 Obesity, unspecified: Secondary | ICD-10-CM

## 2023-10-03 DIAGNOSIS — E7849 Other hyperlipidemia: Secondary | ICD-10-CM

## 2023-10-03 MED ORDER — TRULICITY 4.5 MG/0.5ML ~~LOC~~ SOAJ: 4.5000 mg | SUBCUTANEOUS | 0 refills | Status: DC

## 2023-10-03 MED ORDER — ONDANSETRON HCL 4 MG PO TABS: 4.0000 mg | ORAL_TABLET | Freq: Three times a day (TID) | ORAL | 2 refills | Status: DC | PRN

## 2023-10-07 ENCOUNTER — Other Ambulatory Visit (INDEPENDENT_AMBULATORY_CARE_PROVIDER_SITE_OTHER): Payer: Self-pay | Admitting: Physician Assistant

## 2023-10-07 DIAGNOSIS — E559 Vitamin D deficiency, unspecified: Secondary | ICD-10-CM

## 2023-10-23 NOTE — Progress Notes (Unsigned)
 SUBJECTIVE: Discussed the use of AI scribe software for clinical note transcription with the patient, who gave verbal consent to proceed.  Chief Complaint: Obesity  Interim History: She is up 1 lb since last visit.  Down 16 lbs overall TBW loss of 6.81% Muscle mass + 4.2 lbs !! Adipose mass - 3.8 lbs !! Brittney Farmer is here to discuss her progress with her obesity treatment plan. She is on the Category 2 Plan and states she is following her eating plan approximately 50 % of the time. She states she is exercising 20-30 minutes 2 times per week. Brittney Farmer is a 38 year old female who presents for follow-up of her obesity treatment plan.  She has experienced a weight increase of one pound since her last visit, with a gain of 4.2 pounds in muscle mass and a loss of 3.8 pounds in adipose tissue. She has increased her physical activity, including walking and swimming, despite the hot weather. She notes fluctuations in her weight, particularly after indulging in food at the beach, but observes that her lean mass has improved.  She is currently on Trulicity  4.5 mg once weekly and metformin  500 mg twice daily for insulin  resistance. She did not take Trulicity  during her beach trip to avoid potential side effects. No excessive nausea or vomiting, but she feels more tired than usual. She has noticed a reduction in cravings for sweets. Denies mass in neck, dysphagia, dyspepsia, persistent hoarseness, abdominal pain, or N/V/Constipation or diarrhea. Has annual eye exam. Mood is stable.    For vitamin D  deficiency, she takes ergocalciferol  50,000 units every 14 days. She also uses topiramate  50 mg at bedtime and plans to take the dose in the evenings to reduce some morning tiredness she been noticing lately.   She continues to take metformin , usually one tablet around supper time, and reports no constipation.  She has stopped taking Lexapro  and Buspar , noting an improvement in her anxiety levels, which she  attributes to increased energy and weight loss. 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 She plans to follow up with her PCP.   Her dietary habits include consuming Fairlife chocolate milk for protein, which helps her feel full from breakfast to lunch. She has been trying to increase her water  intake, especially after noticing increased diet soda consumption during her beach trip.   Plan fasting labs at [facility] in August  OBJECTIVE: Visit Diagnoses: Problem List Items Addressed This Visit     GAD (generalized anxiety disorder)   Obesity Start BMI 41.63 Date 02/20/20   Relevant Medications   Dulaglutide  (TRULICITY ) 4.5 MG/0.5ML SOAJ   Vitamin D  deficiency   Insulin  resistance - Primary   Relevant Medications   Dulaglutide  (TRULICITY ) 4.5 MG/0.5ML SOAJ   Eating disorder   Other Visit Diagnoses       Nausea       Relevant Medications   ondansetron  (ZOFRAN ) 4 MG tablet     Obesity She has gained one pound since her last visit but increased muscle mass by 4.2 pounds and decreased adipose mass by 3.8 pounds. The focus is on improving lean mass rather than focusing solely on BMI or overall weight.  Her activity level has contributed to muscle gain. Emphasized the importance of lean mass in promoting further weight loss and maintaining weight loss. - Continue Trulicity  4.5 mg once weekly - Continue metformin  500 mg twice daily - Encourage  weightlifting and physical activity - Monitor weight and body composition  Insulin  Resistance She is on Trulicity  and metformin  for insulin  resistance. Reports no significant side effects from Trulicity , except for mild fatigue and mild nausea at times. Noticed a reduction in cravings for sweets, which is a positive outcome of the treatment. Discussed the potential for water  retention during adipose burning stages, which may affect weight measurements. Advised to monitor  for side effects such as nausea and vomiting and use Zofran  as needed. - Continue Trulicity  4.5 mg once weekly - Continue metformin  500 mg once daily - Monitor for side effects such as nausea and vomiting - Use/refill Zofran  as needed for nausea Meds ordered this encounter  Medications   Dulaglutide  (TRULICITY ) 4.5 MG/0.5ML SOAJ    Sig: Inject 4.5 mg as directed once a week.    Dispense:  2 mL    Refill:  0   ondansetron  (ZOFRAN ) 4 MG tablet    Sig: Take 1 tablet (4 mg total) by mouth every 8 (eight) hours as needed for nausea or vomiting. TAKE 1 TABLET BY MOUTH EVERY 8 HOURS AS NEEDED FOR NAUSEA AND VOMITING    Dispense:  90 tablet    Refill:  2      Vitamin D  Deficiency She is on ergocalciferol  50,000 units every 14 days for vitamin D  deficiency. Vitamin D  should help with fatigue. Last vitamin D  Lab Results  Component Value Date   VD25OH 88.5 08/15/2023   No N/V or muscle weakness with Ergocalciferol , Decreased to every other week as level had increased.  - Continue ergocalciferol  50,000 units every 14 days- no refill needed Low vitamin D  levels can be associated with adiposity and may result in leptin resistance and weight gain. Also associated with fatigue.  Currently on vitamin D  supplementation without any adverse effects such as nausea, vomiting or muscle weakness.  Plan to recheck labs in August  Eating disorder/emotional eating Brittney Farmer has had issues with stress/emotional eating. Currently this is well controlled. Overall mood is stable. Medication(s): Topiramate  50 MG NIGHTLY Reports some am fatigue, so plans to take topiramate  in evening and monitor closely. No other SE.  Plan: Continue Topiramate  50 mg every evening for cravings.    Anxiety Reports significant improvement in anxiety symptoms, which she attributes to increased energy levels and weight loss. Has stopped taking Lexapro  and Buspar  and feels no need to see her mental health provider at this time. -  Discontinue Lexapro  from med list - Discontinue Buspar  from med list She plans follow up with pcp.  General Health Maintenance Encouraged to maintain adequate protein intake and hydration. Advised to continue her current dietary habits, including the use of Fairlife chocolate milk for protein. Encouraged to reduce soda intake and increase water  consumption. - Encourage adequate protein intake - Encourage hydration and water  consumption - Reduce diet soda intake  Follow-up Scheduled follow-up appointments and fasting labs to monitor her progress. - Follow up on November 14, 2023 - Follow up on December 12, 2023 with fasting labs - Schedule follow-up for January 09, 2024 at 8:30 AM  Vitals Temp: 98.4 F (36.9 C) BP: 117/73 Pulse Rate: 77 SpO2: 99 %   Anthropometric Measurements Height: 5' 3 (1.6 m) Weight: 219 lb (99.3 kg) BMI (Calculated): 38.8 Weight at Last Visit: 218 lb Weight Lost Since Last Visit: 0 lb Weight Gained Since Last Visit: 1 lb Starting Weight: 235 lb Total Weight Loss (lbs): 16 lb (7.258 kg) Peak Weight: 256 lb   Body  Composition  Body Fat %: 42.8 % Fat Mass (lbs): 93.8 lbs Muscle Mass (lbs): 118.8 lbs Total Body Water  (lbs): 83.2 lbs Visceral Fat Rating : 13   Other Clinical Data Fasting: yes Labs: yes Today's Visit #: 42 Starting Date: 02/20/20     ASSESSMENT AND PLAN:  Diet: Brittney Farmer is currently in the action stage of change. As such, her goal is to continue with weight loss efforts. She has agreed to Category 2 Plan.  Exercise: Brittney Farmer has been instructed to work up to a goal of 150 minutes of combined cardio and strengthening exercise per week for weight loss and overall health benefits.   Behavior Modification:  We discussed the following Behavioral Modification Strategies today: increasing lean protein intake, decreasing simple carbohydrates, increasing vegetables, increase H2O intake, increase high fiber foods, no skipping meals, meal  planning and cooking strategies, emotional eating strategies , avoiding temptations, and planning for success. We discussed various medication options to help Brittney Farmer with her weight loss efforts and we both agreed to continue current treatment plan, continue to work on nutritional and behavioral strategies to promote weight loss.  .  Return in about 3 weeks (around 11/14/2023).Brittney Farmer She was informed of the importance of frequent follow up visits to maximize her success with intensive lifestyle modifications for her multiple health conditions.  Attestation Statements:   Reviewed by clinician on day of visit: allergies, medications, problem list, medical history, surgical history, family history, social history, and previous encounter notes.   Time spent on visit including pre-visit chart review and post-visit care and charting was 26 minutes.    Brittney Balducci, PA-C

## 2023-10-24 ENCOUNTER — Ambulatory Visit (INDEPENDENT_AMBULATORY_CARE_PROVIDER_SITE_OTHER): Admitting: Physician Assistant

## 2023-10-24 ENCOUNTER — Encounter (INDEPENDENT_AMBULATORY_CARE_PROVIDER_SITE_OTHER): Payer: Self-pay | Admitting: Physician Assistant

## 2023-10-24 VITALS — BP 117/73 | HR 77 | Temp 98.4°F | Ht 63.0 in | Wt 219.0 lb

## 2023-10-24 DIAGNOSIS — R110 Nausea: Secondary | ICD-10-CM

## 2023-10-24 DIAGNOSIS — Z6838 Body mass index (BMI) 38.0-38.9, adult: Secondary | ICD-10-CM

## 2023-10-24 DIAGNOSIS — E559 Vitamin D deficiency, unspecified: Secondary | ICD-10-CM

## 2023-10-24 DIAGNOSIS — E88819 Insulin resistance, unspecified: Secondary | ICD-10-CM | POA: Diagnosis not present

## 2023-10-24 DIAGNOSIS — F411 Generalized anxiety disorder: Secondary | ICD-10-CM

## 2023-10-24 DIAGNOSIS — E7849 Other hyperlipidemia: Secondary | ICD-10-CM

## 2023-10-24 DIAGNOSIS — E538 Deficiency of other specified B group vitamins: Secondary | ICD-10-CM

## 2023-10-24 DIAGNOSIS — F5089 Other specified eating disorder: Secondary | ICD-10-CM

## 2023-10-24 DIAGNOSIS — E669 Obesity, unspecified: Secondary | ICD-10-CM

## 2023-10-24 MED ORDER — TRULICITY 4.5 MG/0.5ML ~~LOC~~ SOAJ
4.5000 mg | SUBCUTANEOUS | 0 refills | Status: DC
Start: 2023-10-24 — End: 2023-11-14

## 2023-10-24 MED ORDER — ONDANSETRON HCL 4 MG PO TABS: 4.0000 mg | ORAL_TABLET | Freq: Three times a day (TID) | ORAL | 2 refills | Status: DC | PRN

## 2023-10-29 ENCOUNTER — Other Ambulatory Visit (INDEPENDENT_AMBULATORY_CARE_PROVIDER_SITE_OTHER): Payer: Self-pay | Admitting: Physician Assistant

## 2023-10-29 DIAGNOSIS — E559 Vitamin D deficiency, unspecified: Secondary | ICD-10-CM

## 2023-11-13 NOTE — Progress Notes (Unsigned)
 SUBJECTIVE: Discussed the use of AI scribe software for clinical note transcription with the patient, who gave verbal consent to proceed. Patient will return to clinic every can get any more home IV Chief Complaint: Obesity  Interim History: She is down 2 pounds since her last visit Down 18 pounds overall TBW loss of 7.7% Brittney Farmer is here to discuss her progress with her obesity treatment plan. She is on the Category 2 Plan and states she is following her eating plan approximately 25 % of the time. She states she is exercising 20 minutes 3 or 4 times per week.  Brittney Farmer is a 38 year old female with obesity who presents for follow-up of her obesity treatment plan.  She is currently on Trulicity  4.5 mg weekly and metformin  500 mg twice daily. She was surprised that her weight was not higher, as she had eaten before the appointment. Hunger levels have remained stable, and she has not been skipping meals. She experiences cravings, particularly for ice cream, which she attributes to the summer heat and recent sales at the grocery store. She consumes ice cream directly from the container, estimating her intake to be between one and two scoops.  Her exercise routine was consistent last week, walking five days out of the week, but she has not walked this week due to her daughter's first day of school, which was stressful. She reports sleeping well but has been experiencing severe headaches. These headaches occur daily, regardless of activity, and have been persistent for a few years. She is currently taking topiramate  50 mg at night.  There is a family history of brain tumors, as her grandfather currently has two brain tumors. Her mother has recently been diagnosed with cancer in her back and is undergoing radiation treatment. She expresses concern about her headaches potentially being related to her family history.  She reports mild constipation, which she attributes to her IBS. No nausea,  vomiting, diarrhea, changes in vision, mood, or difficulty swallowing. OBJECTIVE: Visit Diagnoses: Problem List Items Addressed This Visit     Obesity Start BMI 41.63 Date 02/20/20   Relevant Medications   Dulaglutide  (TRULICITY ) 4.5 MG/0.5ML SOAJ   Migraine   Relevant Orders   Ambulatory referral to Neurology   Vitamin D  deficiency   Relevant Medications   Vitamin D , Ergocalciferol , (DRISDOL ) 1.25 MG (50000 UNIT) CAPS capsule   Insulin  resistance - Primary   Relevant Medications   Dulaglutide  (TRULICITY ) 4.5 MG/0.5ML SOAJ   BMI 38.0-38.9,adult Current BMI 38.8   Other Visit Diagnoses       Nausea       Relevant Medications   ondansetron  (ZOFRAN ) 4 MG tablet     Obesity Currently managed with Trulicity  4.5 mg weekly and metformin  500 mg twice daily. Weight and appetite are well-managed, though she occasionally indulges in ice cream. Exercise was consistent last week but not this week due to stress and family obligations. Discussed portion control and alternatives to high-calorie treats. Some occasional nausea with Trulicity - Zofran  helps with this.  - Continue/refill Zofran  - Continue/refill Trulicity  4.5 mg weekly - Encourage portion control and consider alternatives to high-calorie treats Meds ordered this encounter  Medications   Dulaglutide  (TRULICITY ) 4.5 MG/0.5ML SOAJ    Sig: Inject 4.5 mg as directed once a week.    Dispense:  2 mL    Refill:  0   Vitamin D , Ergocalciferol , (DRISDOL ) 1.25 MG (50000 UNIT) CAPS capsule    Sig: Take 1 capsule (50,000 Units total) by mouth  every 14 (fourteen) days.    Dispense:  4 capsule    Refill:  0   ondansetron  (ZOFRAN ) 4 MG tablet    Sig: Take 1 tablet (4 mg total) by mouth every 8 (eight) hours as needed for nausea or vomiting. TAKE 1 TABLET BY MOUTH EVERY 8 HOURS AS NEEDED FOR NAUSEA AND VOMITING    Dispense:  90 tablet    Refill:  2     Chronic Headaches/Migraine HA She experiences daily headaches unresponsive to current  topiramate  50 mg at bedtime. Family history of brain tumors and cancer is concerning. Discussed medication side effects and hormonal influences. No weakness, no vision changes, no intractible HA, but concerned HA are becoming daily. ? Related to GLP medication.  - Increase topiramate  to 100 mg at bedtime, monitoring for fatigue/ monitor for response.  - Refer to neurology for further evaluation- has never had any imaging. Has family hx of brain tumors. Worried about continued HA.   Insulin  resistance Last A1c was 5.0- improved- at goal. Insulin  5.7- improved- at goal.   Medication(s): Trulicity  4.5 mg SQ weekly and Metformin  500 mg twice daily with meals Polyphagia:No Lab Results  Component Value Date   HGBA1C 5.0 08/15/2023   HGBA1C 5.3 01/31/2023   HGBA1C 5.3 05/10/2022   HGBA1C 5.2 11/11/2021   HGBA1C 5.1 02/20/2020   Lab Results  Component Value Date   INSULIN  5.7 08/15/2023   INSULIN  12.3 01/31/2023   INSULIN  10.0 05/10/2022   INSULIN  12.5 11/11/2021   INSULIN  5.2 02/20/2020    Plan: Continue and refill Trulicity  4.5 mg SQ weekly Continue metformin  500 mg twice daily Continue working on nutrition plan to decrease simple carbohydrates, increase lean proteins and exercise to promote weight loss, improve glycemic control and prevent progression to Type 2 diabetes.   Vitamin D  Deficiency Vitamin D  is at goal of 50.  Most recent vitamin D  level was 88.5. She is on  prescription ergocalciferol  50,000 IU every 14 days. No N/V or muscle weakness with Ergocalciferol .  Lab Results  Component Value Date   VD25OH 88.5 08/15/2023   VD25OH 45.4 01/31/2023   VD25OH 27.7 (L) 05/10/2022    Plan: Continue and refill  prescription ergocalciferol  50,000 IU every 14 days Low vitamin D  levels can be associated with adiposity and may result in leptin resistance and weight gain. Also associated with fatigue.  Currently on vitamin D  supplementation without any adverse effects such as nausea,  vomiting or muscle weakness.  Plan to recheck level over next 1-2 months.   General Health Maintenance Discussed fasting for upcoming lab work. - Fast and hydrate well before labs at next appointment  Follow-up Prefers appointments not at 12:30 PM. Next appointments scheduled for August 12 and October 7. - Schedule next appointment for August 12 at 8:30 AM - Schedule follow-up appointment for October 7 at 9:00 AM  Vitals Temp: 98 F (36.7 C) BP: 118/79 Pulse Rate: 87 SpO2: 98 %   Anthropometric Measurements Height: 5' 3 (1.6 m) Weight: 217 lb (98.4 kg) BMI (Calculated): 38.45 Weight at Last Visit: 219lb Weight Lost Since Last Visit: 2lb Weight Gained Since Last Visit: 0 Starting Weight: 235lb Total Weight Loss (lbs): 18 lb (8.165 kg) Peak Weight: 256lb   Body Composition  Body Fat %: 43.2 % Fat Mass (lbs): 94 lbs Muscle Mass (lbs): 117.4 lbs Total Body Water  (lbs): 81.2 lbs Visceral Fat Rating : 11   Other Clinical Data Fasting: no Labs: no Today's Visit #: 43 Starting  Date: 02/20/20     ASSESSMENT AND PLAN:  Diet: Amberrose is currently in the action stage of change. As such, her goal is to continue with weight loss efforts. She has agreed to Category 2 Plan.  Exercise: Ashara has been instructed to work up to a goal of 150 minutes of combined cardio and strengthening exercise per week for weight loss and overall health benefits.   Behavior Modification:  We discussed the following Behavioral Modification Strategies today: increasing lean protein intake, decreasing simple carbohydrates, increasing vegetables, increase H2O intake, increase high fiber foods, no skipping meals, meal planning and cooking strategies, better snacking choices, avoiding temptations, and planning for success. We discussed various medication options to help Giavana with her weight loss efforts and we both agreed to continue current treatment plan.  Return in about 4 weeks (around  12/12/2023).SABRA She was informed of the importance of frequent follow up visits to maximize her success with intensive lifestyle modifications for her multiple health conditions.  Attestation Statements:   Reviewed by clinician on day of visit: allergies, medications, problem list, medical history, surgical history, family history, social history, and previous encounter notes.   Time spent on visit including pre-visit chart review and post-visit care and charting was 27 minutes.    Brittney Tamura, Brittney Farmer

## 2023-11-14 ENCOUNTER — Encounter (INDEPENDENT_AMBULATORY_CARE_PROVIDER_SITE_OTHER): Payer: Self-pay | Admitting: Physician Assistant

## 2023-11-14 ENCOUNTER — Ambulatory Visit (INDEPENDENT_AMBULATORY_CARE_PROVIDER_SITE_OTHER): Admitting: Physician Assistant

## 2023-11-14 VITALS — BP 118/79 | HR 87 | Temp 98.0°F | Ht 63.0 in | Wt 217.0 lb

## 2023-11-14 DIAGNOSIS — E669 Obesity, unspecified: Secondary | ICD-10-CM

## 2023-11-14 DIAGNOSIS — E538 Deficiency of other specified B group vitamins: Secondary | ICD-10-CM

## 2023-11-14 DIAGNOSIS — R110 Nausea: Secondary | ICD-10-CM

## 2023-11-14 DIAGNOSIS — G43909 Migraine, unspecified, not intractable, without status migrainosus: Secondary | ICD-10-CM | POA: Diagnosis not present

## 2023-11-14 DIAGNOSIS — F5089 Other specified eating disorder: Secondary | ICD-10-CM

## 2023-11-14 DIAGNOSIS — E88819 Insulin resistance, unspecified: Secondary | ICD-10-CM | POA: Diagnosis not present

## 2023-11-14 DIAGNOSIS — G43109 Migraine with aura, not intractable, without status migrainosus: Secondary | ICD-10-CM

## 2023-11-14 DIAGNOSIS — Z6838 Body mass index (BMI) 38.0-38.9, adult: Secondary | ICD-10-CM

## 2023-11-14 DIAGNOSIS — E559 Vitamin D deficiency, unspecified: Secondary | ICD-10-CM | POA: Diagnosis not present

## 2023-11-14 MED ORDER — TRULICITY 4.5 MG/0.5ML ~~LOC~~ SOAJ: 4.5000 mg | SUBCUTANEOUS | 0 refills | Status: DC

## 2023-11-14 MED ORDER — VITAMIN D (ERGOCALCIFEROL) 1.25 MG (50000 UNIT) PO CAPS: 50000.0000 [IU] | ORAL_CAPSULE | ORAL | 0 refills | Status: DC

## 2023-11-14 MED ORDER — ONDANSETRON HCL 4 MG PO TABS: 4.0000 mg | ORAL_TABLET | Freq: Three times a day (TID) | ORAL | 2 refills | Status: DC | PRN

## 2023-12-01 ENCOUNTER — Encounter: Payer: Self-pay | Admitting: Neurology

## 2023-12-11 NOTE — Progress Notes (Signed)
 SUBJECTIVE: Discussed the use of AI scribe software for clinical note transcription with the patient, who gave verbal consent to proceed.  Chief Complaint: Obesity  Interim History: She is up 5 lbs since her last visit  Brittney Farmer is here to discuss her progress with her obesity treatment plan. She is on the Category 2 Plan and states she is following her eating plan approximately 50 % of the time. She states she is not exercising .  Brittney Farmer is a 38 year old female with obesity who presents for follow-up of her obesity treatment plan.  She is adhering to her obesity treatment plan approximately fifty percent of the time. She maintains adequate hydration but occasionally skips meals and feels she is not meeting her protein needs. She has gained five pounds since the last visit.  She has been on Trulicity  4.5 mg weekly for insulin  resistance. She feels burned out with her current routine and diet plan, describing it as 'the same routine' and 'bored'. Despite this, she reports that Trulicity  helps with cravings, but she has experienced bloating and abdominal pain after eating for the last two weeks.  She has a history of irritable bowel syndrome (IBS) with alternating constipation and diarrhea, which she considers her baseline. She also experiences acid reflux, which has flared up in the last couple of weeks.She is taking omeprazole  for reflux.   She takes metformin  but not always consistently, noting that it can cause diarrhea if taken with sweets. She is concerned about the complexity of her diet plan due to her busy schedule and is looking for simpler options. She typically has a shake for breakfast due to time constraints and dislikes eggs.  Her social history includes a busy lifestyle with responsibilities for her children, including school and extracurricular activities. This impacts her ability to maintain a consistent diet and exercise routine. She has been eating out more frequently  in the past few weeks, which she attributes to spending time with her children during their school break.  She is currently taking Topamax , which helps her sleep well. She has not been taking it consistently but notices a difference in her sleep quality when she does. We discussed that this may also help with cravings/emotional eating.   Fasting labs were obtained. The patient was informed we would discuss the lab results at the next visit unless there is a critical issue that needs to be addressed sooner. The patient agreed to keep the next visit at the agreed upon time to discuss these results.   OBJECTIVE: Visit Diagnoses: Problem List Items Addressed This Visit     Obesity Start BMI 41.63 Date 02/20/20   Relevant Medications   Dulaglutide  (TRULICITY ) 4.5 MG/0.5ML SOAJ   Migraine   Other fatigue   Relevant Orders   Vitamin B12   Other hyperlipidemia   Relevant Orders   Lipid Panel With LDL/HDL Ratio   Vitamin D  deficiency   Relevant Orders   VITAMIN D  25 Hydroxy (Vit-D Deficiency, Fractures)   Insulin  resistance - Primary   Relevant Medications   Dulaglutide  (TRULICITY ) 4.5 MG/0.5ML SOAJ   Other Relevant Orders   CMP14+EGFR   Hemoglobin A1c   Insulin , random   Other Visit Diagnoses       BMI 39.0-39.9,adult Current BMI 39.3         Obesity with insulin  resistance Obesity with insulin  resistance, currently on a category two diet plan and Trulicity  4.5 mg weekly. Reports feeling burned out with the current routine and experiencing  bloating, likely due to Trulicity . Weight gain of 5 pounds, but with an increase in muscle mass. Difficulty adhering to diet plan due to busy schedule and lack of time for meal preparation. Concerns about accountability if taking a break from the plan. - Continue Trulicity  4.5 mg weekly - Focus on protein, fiber, and water  intake - Consider microwave meals for convenience - Review dining out guides for healthier options - Refill Trulicity   prescription for insulin  resistance.  Insulin  Resistance Last fasting insulin  was 5.7- much improved on Trulicity . A1c was 5.0- also improved. Polyphagia:Yes- at times Medication(s): Trulicity  4.5 mg SQ weekly Lab Results  Component Value Date   HGBA1C 5.0 08/15/2023   HGBA1C 5.3 01/31/2023   HGBA1C 5.3 05/10/2022   HGBA1C 5.2 11/11/2021   HGBA1C 5.1 02/20/2020   Lab Results  Component Value Date   INSULIN  5.7 08/15/2023   INSULIN  12.3 01/31/2023   INSULIN  10.0 05/10/2022   INSULIN  12.5 11/11/2021   INSULIN  5.2 02/20/2020    Plan: Continue and refill Trulicity  4.5 mg SQ weekly Continue working on nutrition plan to decrease simple carbohydrates, increase lean proteins and exercise to promote weight loss, improve glycemic control and prevent progression to Type 2 diabetes.  Recheck labs today   Gastroesophageal reflux disease Gastroesophageal reflux disease with recent flare-up of symptoms, possibly related to dietary changes and not adhering to the diet plan. Continue omeprazole  and monitor symptoms on GLP -1 medication  Irritable bowel syndrome Irritable bowel syndrome with alternating constipation and diarrhea, consistent with baseline symptoms. Monitor closely on GLP-1 medication  Hyperlipidemia Last lipids Lab Results  Component Value Date   CHOL 196 08/15/2023   HDL 50 08/15/2023   LDLCALC 135 (H) 08/15/2023   TRIG 62 08/15/2023   CHOLHDL 3.4 07/12/2022  LDL not at goal. Trig- at goal. HDL- at goal.  Plan: Continue to work on nutrition plan -decreasing simple carbohydrates, increasing lean proteins, decreasing saturated fats and cholesterol , avoiding trans fats and exercise as able to promote weight loss, improve lipids and decrease cardiovascular risks.                                     - Order fasting lipid panel   Migraine HA On topiramate  but not always consistent. Noted slept better with topiramate , but not helping greatly with cravings.  Discussed  can adjust dose to max of 100 mg daily to help with both HA, and cravings.  Monitor response and titrate for effect/monitor for SE.   Vitamin D  deficiency On Ergocalciferol  50,000 units every other week.  Low vitamin D  levels can be associated with adiposity and may result in leptin resistance and weight gain. Also associated with fatigue.  Currently on vitamin D  supplementation without any adverse effects such as nausea, vomiting or muscle weakness.  Vitamin D  deficiency, current status to be evaluated with lab work. - Order vitamin D  level  Other fatigue Endorses feeling fatigued at times.  Plan: Recheck B 12, vitamin D  levels and adjust supplementation accordingly.   Vitals Temp: 97.7 F (36.5 C) BP: 116/76 Pulse Rate: 75 SpO2: 99 %   Anthropometric Measurements Height: 5' 3 (1.6 m) Weight: 222 lb (100.7 kg) BMI (Calculated): 39.34 Weight at Last Visit: 217 lb Weight Lost Since Last Visit: 0 Weight Gained Since Last Visit: 5 lb Starting Weight: 235 lb Total Weight Loss (lbs): 13 lb (5.897 kg) Peak Weight: 256 lb  Body Composition  Body Fat %: 42.7 % Fat Mass (lbs): 94.8 lbs Muscle Mass (lbs): 120.8 lbs Total Body Water  (lbs): 84 lbs Visceral Fat Rating : 11   Other Clinical Data Fasting: yes Labs: yes Today's Visit #: 44 Starting Date: 02/20/20     ASSESSMENT AND PLAN:  Diet: Brittney Farmer is currently in the action stage of change. As such, her goal is to continue with weight loss efforts. She has agreed to Category 2 Plan and following a lower carbohydrate, vegetable and lean protein rich diet plan.  Exercise: Brittney Farmer has been instructed that some exercise is better than none for weight loss and overall health benefits.   Behavior Modification:  We discussed the following Behavioral Modification Strategies today: increasing lean protein intake, decreasing simple carbohydrates, increasing vegetables, increase H2O intake, increase high fiber foods, no  skipping meals, meal planning and cooking strategies, ways to avoid boredom eating, avoiding temptations, and planning for success. We discussed various medication options to help Brittney Farmer with her weight loss efforts and we both agreed to continue current treatment plan and consider trying low carbohydrate plan for 2-3 weeks for reset .  Return in about 4 weeks (around 01/09/2024).Brittney Farmer She was informed of the importance of frequent follow up visits to maximize her success with intensive lifestyle modifications for her multiple health conditions.  Attestation Statements:   Reviewed by clinician on day of visit: allergies, medications, problem list, medical history, surgical history, family history, social history, and previous encounter notes.   Time spent on visit including pre-visit chart review and post-visit care and charting was 32 minutes.    Kylie Simmonds, PA-C

## 2023-12-12 ENCOUNTER — Ambulatory Visit (INDEPENDENT_AMBULATORY_CARE_PROVIDER_SITE_OTHER): Admitting: Physician Assistant

## 2023-12-12 ENCOUNTER — Encounter (INDEPENDENT_AMBULATORY_CARE_PROVIDER_SITE_OTHER): Payer: Self-pay | Admitting: Physician Assistant

## 2023-12-12 VITALS — BP 116/76 | HR 75 | Temp 97.7°F | Ht 63.0 in | Wt 222.0 lb

## 2023-12-12 DIAGNOSIS — E669 Obesity, unspecified: Secondary | ICD-10-CM | POA: Diagnosis not present

## 2023-12-12 DIAGNOSIS — Z6839 Body mass index (BMI) 39.0-39.9, adult: Secondary | ICD-10-CM

## 2023-12-12 DIAGNOSIS — E7849 Other hyperlipidemia: Secondary | ICD-10-CM | POA: Diagnosis not present

## 2023-12-12 DIAGNOSIS — E88819 Insulin resistance, unspecified: Secondary | ICD-10-CM | POA: Diagnosis not present

## 2023-12-12 DIAGNOSIS — G43109 Migraine with aura, not intractable, without status migrainosus: Secondary | ICD-10-CM | POA: Diagnosis not present

## 2023-12-12 DIAGNOSIS — R5383 Other fatigue: Secondary | ICD-10-CM | POA: Diagnosis not present

## 2023-12-12 DIAGNOSIS — E559 Vitamin D deficiency, unspecified: Secondary | ICD-10-CM | POA: Diagnosis not present

## 2023-12-12 MED ORDER — TRULICITY 4.5 MG/0.5ML ~~LOC~~ SOAJ
4.5000 mg | SUBCUTANEOUS | 0 refills | Status: DC
Start: 2023-12-12 — End: 2024-01-09

## 2023-12-13 LAB — CMP14+EGFR
ALT: 8 IU/L (ref 0–32)
AST: 9 IU/L (ref 0–40)
Albumin: 4.1 g/dL (ref 3.9–4.9)
Alkaline Phosphatase: 118 IU/L (ref 44–121)
BUN/Creatinine Ratio: 13 (ref 9–23)
BUN: 9 mg/dL (ref 6–20)
Bilirubin Total: 0.4 mg/dL (ref 0.0–1.2)
CO2: 20 mmol/L (ref 20–29)
Calcium: 9.1 mg/dL (ref 8.7–10.2)
Chloride: 103 mmol/L (ref 96–106)
Creatinine, Ser: 0.67 mg/dL (ref 0.57–1.00)
Globulin, Total: 2.6 g/dL (ref 1.5–4.5)
Glucose: 76 mg/dL (ref 70–99)
Potassium: 4.7 mmol/L (ref 3.5–5.2)
Sodium: 138 mmol/L (ref 134–144)
Total Protein: 6.7 g/dL (ref 6.0–8.5)
eGFR: 116 mL/min/1.73 (ref >59)

## 2023-12-13 LAB — LIPID PANEL WITH LDL/HDL RATIO
Cholesterol, Total: 176 mg/dL (ref 100–199)
HDL: 47 mg/dL (ref >39)
LDL Chol Calc (NIH): 115 mg/dL — ABNORMAL HIGH (ref 0–99)
LDL/HDL Ratio: 2.4 ratio (ref 0.0–3.2)
Triglycerides: 73 mg/dL (ref 0–149)
VLDL Cholesterol Cal: 14 mg/dL (ref 5–40)

## 2023-12-13 LAB — HEMOGLOBIN A1C
Est. average glucose Bld gHb Est-mCnc: 97 mg/dL
Hgb A1c MFr Bld: 5 % (ref 4.8–5.6)

## 2023-12-13 LAB — VITAMIN B12: Vitamin B-12: 623 pg/mL (ref 232–1245)

## 2023-12-13 LAB — VITAMIN D 25 HYDROXY (VIT D DEFICIENCY, FRACTURES): Vit D, 25-Hydroxy: 42.8 ng/mL (ref 30.0–100.0)

## 2023-12-13 LAB — INSULIN, RANDOM: INSULIN: 7.6 u[IU]/mL (ref 2.6–24.9)

## 2024-01-03 NOTE — Progress Notes (Unsigned)
 Initial neurology clinic note  Brittney Farmer MRN: [id number] DOB: March 28, 1986  Referring provider: Dierdre Carne Montgome*  Primary care provider: Lavell Bari LABOR, FNP  Reason for consult:  headaches  Subjective:  This is Ms. Brittney Farmer, a 38 y.o. right-handed female with a medical history of vit D deficiency, obesity, and tachycardia who presents to neurology clinic with headaches. The patient is accompanied by 11 year old daughter.  She has had headaches for many years that come and go. Over the last few months, the headaches have been very strong. She thought it was due to her eyes because she sits in front of her computer.  The pain can be anywhere in the head (whole head, back of head, front of head, behind the eye). It can be achy pain or stabbing and pulsating and throbbing in character. The pain can be as bad as 8-9/10. Her headaches last a few hours to all day. Of late, she is having a headache about every other day. Prior to recently, she was having 2-3 headaches per week. She can wake up at night with a headache and more likely to have a headache early in the day. She has associated photophobia, phonophobia, and nausea. When she has a headache, she will sometimes need to lay in a cold dark room. She will take ibuprofen  sometimes multiple times per day when she has a headache. She has never been prescribed a headache rescue medication. She has zofran  tablet as needed for nausea. She also endorses neck pain.  She will sometimes sees spots in her eyes but not necessarily any aura to warn her of headache.  She is currently on topamax  100 mg at bedtime (increased from 50 mg on 11/14/23). She has been on topamax  for at least a year. She is being given this for also for weight loss.  She mentions that she will sometimes snore. She does not feel well rested when she wakes and is tired throughout the night.  Of note, she started Trulicity  a few months ago as well. She is prescribed  pindolol  for tachycardia but not taking it. This is prescribed by a PCP (originally by cardiologist).  Smoker: no OCP use: no Caffiene use: 1-2 diet sodas per day EtOH use: none Restrictive diet: no Family history of neurologic disease including headaches: family history of brain tumors (grandfather), brother with MS, grandmother with strokes  MEDICATIONS:  Outpatient Encounter Medications as of 01/04/2024  Medication Sig   dicyclomine  (BENTYL ) 10 MG capsule TAKE 1 CAPSULE BY MOUTH 4 TIMES DAILY BEFORE MEALS AND AT BEDTIME FOR ABDOMINAL PAIN (Patient taking differently: Take 10 mg by mouth as needed (stomach cramps).)   Dulaglutide  (TRULICITY ) 4.5 MG/0.5ML SOAJ Inject 4.5 mg as directed once a week.   ibuprofen  (ADVIL ) 800 MG tablet Take 1 tablet (800 mg total) by mouth every 8 (eight) hours as needed.   metFORMIN  (GLUCOPHAGE ) 500 MG tablet TAKE 1 TABLETS BY MOUTH TWICE DAILY WITH MEALS   omeprazole  (PRILOSEC) 40 MG capsule Take 1 capsule (40 mg total) by mouth daily.   ondansetron  (ZOFRAN ) 4 MG tablet Take 1 tablet (4 mg total) by mouth every 8 (eight) hours as needed for nausea or vomiting. TAKE 1 TABLET BY MOUTH EVERY 8 HOURS AS NEEDED FOR NAUSEA AND VOMITING   propranolol  (INDERAL ) 20 MG tablet Take 1 tablet (20 mg total) by mouth 2 (two) times daily.   SUMAtriptan  (IMITREX ) 100 MG tablet Take 1 tablet (100 mg total) by mouth once as needed  for up to 1 dose. May repeat in 2 hours if headache persists or recurs.   topiramate  (TOPAMAX ) 50 MG tablet Take 50 mg at bedtime and increase to 100 mg nightly if tolerated.   Vitamin D , Ergocalciferol , (DRISDOL ) 1.25 MG (50000 UNIT) CAPS capsule Take 1 capsule (50,000 Units total) by mouth every 14 (fourteen) days.   cyclobenzaprine  (FLEXERIL ) 5 MG tablet Take 1 tablet (5 mg total) by mouth 3 (three) times daily as needed for muscle spasms. (Patient not taking: Reported on 01/04/2024)   fluticasone  (FLONASE ) 50 MCG/ACT nasal spray Place 2 sprays into both  nostrils daily. (Patient not taking: Reported on 01/04/2024)   loratadine  (CLARITIN ) 10 MG tablet Take 1 tablet (10 mg total) by mouth daily. (Patient not taking: Reported on 01/04/2024)   temazepam  (RESTORIL ) 7.5 MG capsule Take 1 capsule (7.5 mg total) by mouth at bedtime as needed for sleep. (Patient not taking: Reported on 01/04/2024)   [DISCONTINUED] pindolol  (VISKEN ) 5 MG tablet TAKE 1/2 TABLET (2.5MG ) IN THE MORNING AND 1 TABLET IN THE EVENING (5MG ) **NEEDS TO BE SEEN BEFORE NEXT REFILL** (Patient not taking: Reported on 01/04/2024)   No facility-administered encounter medications on file as of 01/04/2024.    PAST MEDICAL HISTORY: Past Medical History:  Diagnosis Date   ADD (attention deficit disorder)    ADHD    Anemia    Anxiety    Back pain    Constipation    Depression    GERD (gastroesophageal reflux disease)    IBS (irritable bowel syndrome)    IBS (irritable bowel syndrome)    Palpitations    SOB (shortness of breath) on exertion    Spine curvature, acquired    Stomach ulcer     PAST SURGICAL HISTORY: Past Surgical History:  Procedure Laterality Date   BIOPSY  07/27/2020   Procedure: BIOPSY;  Surgeon: Cindie Carlin POUR, DO;  Location: [facility];  Service: Endoscopy;;   COLONOSCOPY WITH PROPOFOL  N/A 07/27/2020   Procedure: COLONOSCOPY WITH PROPOFOL ;  Surgeon: Cindie Carlin POUR, DO;  Location: [facility];  Service: Endoscopy;  Laterality: N/A;  PM   ESOPHAGOGASTRODUODENOSCOPY (EGD) WITH PROPOFOL  N/A 07/27/2020   Procedure: ESOPHAGOGASTRODUODENOSCOPY (EGD) WITH PROPOFOL ;  Surgeon: Cindie Carlin POUR, DO;  Location: [facility];  Service: Endoscopy;  Laterality: N/A;   HAND SURGERY Left 09/08/2016   [facility]   TUBAL LIGATION  03/13/2019   UMBILICAL HERNIA REPAIR N/A 10/20/2021   Procedure: HERNIA REPAIR UMBILICAL ADULT W/ MESH;  Surgeon: Evonnie Dorothyann LABOR, DO;  Location: [facility];  Service: General;  Laterality: N/A;   VAGINAL DELIVERY  03/13/2019   WISDOM TOOTH  EXTRACTION      ALLERGIES: Allergies  Allergen Reactions   Latex     Band aids - causes skin irritation      FAMILY HISTORY: Family History  Problem Relation Age of Onset   Diabetes Mother    Hyperlipidemia Mother    Hypertension Mother    Heart disease Mother    Depression Mother    Anxiety disorder Mother    Alcoholism Mother    Hyperlipidemia Father    Hypertension Father    Alcoholism Father    Celiac disease Neg Hx    Inflammatory bowel disease Neg Hx    Colon cancer Neg Hx     SOCIAL HISTORY: Social History   Tobacco Use   Smoking status: Never   Smokeless tobacco: Never  Vaping Use   Vaping status: Never Used  Substance Use Topics  Alcohol use: No   Drug use: No   Social History   Social History Narrative   Are you right handed or left handed? Right   Are you currently employed ?    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   Who lives with you? family   What type of home do you live in: 1 story or 2 story? one    Caffience 1 or 2 cans a day    Objective:  Vital Signs:  BP 121/79   Pulse 93   Ht 5' 3 (1.6 m)   Wt 222 lb (100.7 kg)   SpO2 98%   BMI 39.33 kg/m   General: No acute distress.  Patient appears well-groomed.   Head:  Normocephalic/atraumatic Eyes:  fundi examined, disc margins clear, no obvious papilledema Neck: supple, positive for paraspinal tenderness, reduced range of motion Back: No paraspinal tenderness Heart: regular rate and rhythm Lungs: Clear to auscultation bilaterally. Vascular: No carotid bruits.  Neurological Exam: Mental status: alert and oriented, speech fluent and not dysarthric, language intact.  Cranial nerves: CN I: not tested CN II: pupils equal, round and reactive to light, visual fields intact CN III, IV, VI:  full range of motion, no nystagmus, no ptosis CN V: facial sensation intact. CN VII: upper and lower face symmetric CN VIII: hearing intact CN IX, X:  uvula midline CN XI: sternocleidomastoid and trapezius muscles intact CN XII: tongue midline  Bulk & Tone: normal. Motor:  muscle strength 5/5 throughout Deep Tendon Reflexes:  2+ throughout.   Sensation:  Light touch sensation intact. Finger to nose testing:  Without dysmetria.     Gait:  Normal station and stride.  Romberg negative.   Labs and Imaging review: Internal labs: 12/12/23: Vit D wnl Lipid panel: tChol 176, LDL 115, TG 73 HbA1c: 5.0 CMP unremarkable B12: 623  TSH (08/15/23) wnl Folate (01/31/23) wnl Ferritin (01/31/23): 97  Imaging/Procedures: Cervical spine xray (06/28/23): FINDINGS: There is no acute fracture or subluxation of the cervical spine. Slight straightening of lower cervical spine may be related to muscle spasm. The vertebral body heights and disc spaces are maintained. The visualized posterior elements and odontoid are intact. There is anatomic alignment of the lateral masses of C1 and C2. The neural foramina appear patent. The soft tissues are unremarkable.   IMPRESSION: No acute/traumatic cervical spine pathology.  Assessment/Plan:  Schae Cando is a 38 y.o. female who presents for evaluation of headaches. She has a relevant medical history of vit D deficiency, obesity, and tachycardia. Her neurological examination is normal today. Available diagnostic data is significant for normal B12 and TSH. Patient's symptoms are most consistent with migraine without aura with possible contributions from cervicalgia and possible undiagnosed sleep apnea. She has never been on a migraine rescue medication, so it taking a lot of ibuprofen  to help with headaches, which is likely contributing to headache frequency (medication overuse). Finally, GLP1 inhibitor was also started around when her headaches worsened, so could be contributing though not the only cause. She is having about 15 headaches per month currently. She is on topamax  100 mg daily for headaches and weight  loss but not noticing it helping with either. I will add propranolol  given history of tachycardia to see if this helps with migraine prevention.  PLAN: -Sleep medicine referral for possible OSA -PT for cervicalgia [facility])  -For migraines: Migraine prevention:  Continue Topamax  100 mg daily. Stop pindolol . Start propranolol   20 mg twice daily. Migraine rescue:  Sumatriptan  100 mg as needed at headache onset, can repeat after 2 hours if needed Limit use of pain relievers to no more than 2 days out of week to prevent risk of rebound or medication-overuse headache. Keep headache diary   -Return to clinic in 3 months  The impression above as well as the plan as outlined below were extensively discussed with the patient who voiced understanding. All questions were answered to their satisfaction.  When available, results of the above investigations and possible further recommendations will be communicated to the patient via telephone/MyChart. Patient to call office if not contacted after expected testing turnaround time.   Total time spent reviewing records, interview, history/exam, documentation, and coordination of care on day of encounter:  50 min   Thank you for allowing me to participate in patient's care.  If I can answer any additional questions, I would be pleased to do so.  Venetia Potters, MD   CC: Lavell Bari LABOR, FNP [address] [place] [address]  CC: Referring provider: Dierdre Elouise Phlegm, PA-C [address] [place],  [place] [address]

## 2024-01-04 ENCOUNTER — Encounter: Payer: Self-pay | Admitting: Neurology

## 2024-01-04 ENCOUNTER — Ambulatory Visit: Admitting: Neurology

## 2024-01-04 VITALS — BP 121/79 | HR 93 | Ht 63.0 in | Wt 222.0 lb

## 2024-01-04 DIAGNOSIS — M542 Cervicalgia: Secondary | ICD-10-CM

## 2024-01-04 DIAGNOSIS — G4719 Other hypersomnia: Secondary | ICD-10-CM

## 2024-01-04 DIAGNOSIS — Z72820 Sleep deprivation: Secondary | ICD-10-CM | POA: Diagnosis not present

## 2024-01-04 DIAGNOSIS — G43709 Chronic migraine without aura, not intractable, without status migrainosus: Secondary | ICD-10-CM

## 2024-01-04 MED ORDER — PROPRANOLOL HCL 20 MG PO TABS: 20.0000 mg | ORAL_TABLET | Freq: Two times a day (BID) | ORAL | 5 refills | Status: DC

## 2024-01-04 MED ORDER — SUMATRIPTAN SUCCINATE 100 MG PO TABS: 100.0000 mg | ORAL_TABLET | Freq: Once | ORAL | 5 refills | Status: AC | PRN

## 2024-01-04 NOTE — Patient Instructions (Addendum)
 I saw you today for headaches. I think your headaches are migraines but you may have contributions from neck pain and possible issues with your sleep (sleep apnea).  We agreed to do the following today: -Sleep medicine referral for possible sleep apnea -Physical therapy for neck pain  -For migraines: Migraine prevention:  Continue Topamax  100 mg daily. Stop pindolol . Start propranolol  20 mg twice daily. Migraine rescue:  Sumatriptan  100 mg as needed at headache onset, can repeat after 2 hours if needed Limit use of pain relievers to no more than 2 days out of week to prevent risk of rebound or medication-overuse headache. Keep headache diary   -Return to clinic in 3 months  Please let me know if you have any questions or concerns in the meantime.   The physicians and staff at [facility] are committed to providing excellent care. You may receive a survey requesting feedback about your experience at our office. We strive to receive very good responses to the survey questions. If you feel that your experience would prevent you from giving the office a very good  response, please contact our office to try to remedy the situation. We may be reached at [phone]. Thank you for taking the time out of your busy day to complete the survey.  Venetia Potters, MD [facility]  More migraine information: Be aware of common food triggers:  - Caffeine:  coffee, black tea, cola, Mt. Dew  - Chocolate  - Dairy:  aged cheeses (brie, blue, cheddar, gouda, Parmasan, provolone, romano, Swiss, etc), chocolate milk, buttermilk, sour cream, limit eggs and yogurt  - Nuts, peanut butter  - Alcohol  - Cereals/grains:  FRESH breads (fresh bagels, sourdough, doughnuts), yeast productions  - Processed/canned/aged/cured meats (pre-packaged deli meats, hotdogs)  - MSG/glutamate:  soy sauce, flavor enhancer, pickled/preserved/marinated foods  - Sweeteners:  aspartame (Equal, Nutrasweet).  Sugar and  Splenda are okay  - Vegetables:  legumes (lima beans, lentils, snow peas, fava beans, pinto peans, peas, garbanzo beans), sauerkraut, onions, olives, pickles  - Fruit:  avocados, bananas, citrus fruit (orange, lemon, grapefruit), mango  - Other:  Frozen meals, macaroni and cheese Routine exercise Stay adequately hydrated (aim for 64 oz water  daily) Keep headache diary Maintain proper stress management Maintain proper sleep hygiene Do not skip meals Consider supplements:  magnesium citrate 400mg  daily, riboflavin 400mg  daily, coenzyme Q10 100mg  three times daily.

## 2024-01-08 NOTE — Progress Notes (Unsigned)
 SUBJECTIVE: Discussed the use of AI scribe software for clinical note transcription with the patient, who gave verbal consent to proceed.  Chief Complaint: Obesity  Interim History: She is down 3 lbs since her last visit.  Down 16 lbs overall TBW loss of 6.8%  Amanii is here to discuss her progress with her obesity treatment plan. She is on the Category 2 Plan and states she is following her eating plan approximately 50 % of the time. She states she is walking at work 5 times per week.  Maurie Musco is a 38 year old female who presents for follow-up of her obesity treatment plan.  She is adhering to her category two obesity treatment plan approximately 50% of the time and has lost three pounds since her last visit.  She experiences a lack of motivation and energy, describing her mood as 'flat' and expressing fatigue with her current dietary regimen. No depression or anxiety is reported, but a general lack of motivation persists. Her current medications include Trulicity  4.5 mg once weekly, ergocalciferol  50,000 units every 14 days, topiramate  100 mg nightly, and metformin  500 mg twice daily with meals, though metformin  is taken inconsistently due to concerns about liver effects.  She experiences ongoing severe migraines. A recent consultation with a neurologist resulted in a prescription for propranolol  and recommendations for physical therapy and a sleep study. Imitrex  is prescribed for breakthrough headaches, to be used sparingly. Topiramate  has improved her sleep but not her cravings.  She reports experiencing two menstrual cycles per month for the past two to three months, with associated lower back and shoulder pain. Her tubes are tied, and she is not on birth control.  She takes vitamin D  and B12 supplements, with stable vitamin D  levels. B12 supplements are taken as needed. Despite a busy lifestyle managing her children's activities, including soccer and volleyball, she feels her  energy levels are low. OBJECTIVE: Visit Diagnoses: Problem List Items Addressed This Visit     Obesity Start BMI 41.63 Date 02/20/20   Relevant Medications   Dulaglutide  (TRULICITY ) 4.5 MG/0.5ML SOAJ   Migraine   Other hyperlipidemia   Vitamin D  deficiency   B12 deficiency   Insulin  resistance - Primary   Relevant Medications   Dulaglutide  (TRULICITY ) 4.5 MG/0.5ML SOAJ   BMI 38.0-38.9,adult Current BMI 38.8   Other Visit Diagnoses       Menstrual irregularity         Obesity Weight loss of 3 pounds since last visit. Currently on Trulicity  4.5 mg weekly and metformin  500 mg twice daily, though metformin  is not taken consistently. Reports lack of motivation present/tired of thinking about what foods to eat.  She feels the Trulicity  may not significantly aid weight loss but possibly prevents weight gain. Discussion of potential future medication options, including possible release of oral Wegovy , pending FDA approval. - Refill/continue Trulicity  - Consider oral Wegovy  pending FDA approval and ? insurance coverage - Encourage focus on protein intake, hydration, and physical activity  Insulin  Resistance Last fasting insulin  was 7.6- not at goal. A1c was 5.0- at goal. Polyphagia:Yes at times  Medication(s): Trulicity  4.5 mg SQ weekly Lab Results  Component Value Date   HGBA1C 5.0 12/12/2023   HGBA1C 5.0 08/15/2023   HGBA1C 5.3 01/31/2023   HGBA1C 5.3 05/10/2022   HGBA1C 5.2 11/11/2021   Lab Results  Component Value Date   INSULIN  7.6 12/12/2023   INSULIN  5.7 08/15/2023   INSULIN  12.3 01/31/2023   INSULIN  10.0 05/10/2022   INSULIN   12.5 11/11/2021    Plan: Continue and refill Trulicity  4.5 mg SQ weekly Continue working on nutrition plan to decrease simple carbohydrates, increase lean proteins and exercise to promote weight loss, improve glycemic control and prevent progression to Type 2 diabetes.  Meds ordered this encounter  Medications   Dulaglutide  (TRULICITY ) 4.5  MG/0.5ML SOAJ    Sig: Inject 4.5 mg as directed once a week.    Dispense:  2 mL    Refill:  0     Migraines Severe migraines reported, with recent neurologist consultation. Currently on topiramate  100 mg nightly, but does report feeling of having no energy/fatigued and lacking motivation at times.   Propranolol  recently started for migraine prophylaxis and she started this about 1 week ago.  Imitrex  prescribed for breakthrough migraines, with caution on usage due to potential heart rate increase.  Physical therapy and sleep study also recommended by neurologist. Topiramate  may contribute to fatigue and lack of energy. - Continue topiramate  but consider reducing to 50 mg if energy levels remain low - Continue propranolol  as prescribed by neurologist - Use Imitrex  as needed for breakthrough migraines, maximum 10 per month - Attend physical therapy and sleep study as recommended by neurologist  Vitamin D  deficiency Vitamin D  levels improved, currently on ergocalciferol  50,000 units every 14 days. Levels previously slightly supra therapeutic, now well-managed. Last vitamin D  Lab Results  Component Value Date   VD25OH 42.8 12/12/2023   Plan:  Continue Ergocalciferol  50,000 units every 14 days. No refills needed today. Low vitamin D  levels can be associated with adiposity and may result in leptin resistance and weight gain. Also associated with fatigue.  Currently on vitamin D  supplementation without any adverse effects such as nausea, vomiting or muscle weakness.    Vitamin B12 deficiency B12 levels are adequate but could be improved. Advised to take over-the-counter B12 supplement to enhance energy levels. Lab Results  Component Value Date   VITAMINB12 623 12/12/2023  - Take over-the-counter B12 supplement 500 mcg daily to help with energy levels.   Irregular menstrual cycles Experiencing two menstrual cycles per month for the past 2-3 months. Tubal ligation noted. ?Possible  influence of metformin  and Trulicity  on menstrual cycle discussed. - Consult with primary care or GYN for evaluation of irregular menstrual cycles  Vitals Temp: 97.6 F (36.4 C) BP: 106/72 Pulse Rate: 75 SpO2: 100 %   Anthropometric Measurements Height: 5' 3 (1.6 m) Weight: 219 lb (99.3 kg) BMI (Calculated): 38.8 Weight at Last Visit: 222 lb Weight Lost Since Last Visit: 3 lb Weight Gained Since Last Visit: 0 Starting Weight: 235 lb Total Weight Loss (lbs): 16 lb (7.258 kg) Peak Weight: 256 lb   Body Composition  Body Fat %: 44.6 % Fat Mass (lbs): 97.8 lbs Muscle Mass (lbs): 115.2 lbs Total Body Water  (lbs): 81 lbs Visceral Fat Rating : 11   Other Clinical Data Fasting: no Labs: no Today's Visit #: 45 Starting Date: 02/19/21 Comments: Cat 2     ASSESSMENT AND PLAN:  Diet: Kerina is currently in the action stage of change. As such, her goal is to continue with weight loss efforts. She has agreed to Category 2 Plan.  Exercise: Cecila has been instructed to work up to a goal of 150 minutes of combined cardio and strengthening exercise per week for weight loss and overall health benefits.   Behavior Modification:  We discussed the following Behavioral Modification Strategies today: increasing lean protein intake, decreasing simple carbohydrates, increasing vegetables, increase H2O intake, increase  high fiber foods, no skipping meals, meal planning and cooking strategies, emotional eating strategies , avoiding temptations, and planning for success. We discussed various medication options to help Honi with her weight loss efforts and we both agreed to continue current treatment plan.  Return in about 4 weeks (around 02/06/2024).SABRA She was informed of the importance of frequent follow up visits to maximize her success with intensive lifestyle modifications for her multiple health conditions.  Attestation Statements:   Reviewed by clinician on day of visit:  allergies, medications, problem list, medical history, surgical history, family history, social history, and previous encounter notes.   Time spent on visit including pre-visit chart review and post-visit care and charting was 31 minutes.    Ayomide Purdy, PA-C

## 2024-01-09 ENCOUNTER — Ambulatory Visit (INDEPENDENT_AMBULATORY_CARE_PROVIDER_SITE_OTHER): Admitting: Physician Assistant

## 2024-01-09 ENCOUNTER — Encounter (INDEPENDENT_AMBULATORY_CARE_PROVIDER_SITE_OTHER): Payer: Self-pay | Admitting: Physician Assistant

## 2024-01-09 VITALS — BP 106/72 | HR 75 | Temp 97.6°F | Ht 63.0 in | Wt 219.0 lb

## 2024-01-09 DIAGNOSIS — G43909 Migraine, unspecified, not intractable, without status migrainosus: Secondary | ICD-10-CM | POA: Diagnosis not present

## 2024-01-09 DIAGNOSIS — E88819 Insulin resistance, unspecified: Secondary | ICD-10-CM

## 2024-01-09 DIAGNOSIS — Z6838 Body mass index (BMI) 38.0-38.9, adult: Secondary | ICD-10-CM | POA: Diagnosis not present

## 2024-01-09 DIAGNOSIS — E559 Vitamin D deficiency, unspecified: Secondary | ICD-10-CM

## 2024-01-09 DIAGNOSIS — N926 Irregular menstruation, unspecified: Secondary | ICD-10-CM

## 2024-01-09 DIAGNOSIS — F5089 Other specified eating disorder: Secondary | ICD-10-CM

## 2024-01-09 DIAGNOSIS — E7849 Other hyperlipidemia: Secondary | ICD-10-CM

## 2024-01-09 DIAGNOSIS — E538 Deficiency of other specified B group vitamins: Secondary | ICD-10-CM | POA: Diagnosis not present

## 2024-01-09 DIAGNOSIS — G43109 Migraine with aura, not intractable, without status migrainosus: Secondary | ICD-10-CM

## 2024-01-09 MED ORDER — TRULICITY 4.5 MG/0.5ML ~~LOC~~ SOAJ: 4.5000 mg | SUBCUTANEOUS | 0 refills | Status: DC

## 2024-01-16 ENCOUNTER — Encounter: Payer: Self-pay | Admitting: Physical Therapy

## 2024-01-16 ENCOUNTER — Ambulatory Visit: Attending: Neurology | Admitting: Physical Therapy

## 2024-01-16 ENCOUNTER — Other Ambulatory Visit: Payer: Self-pay

## 2024-01-16 DIAGNOSIS — M542 Cervicalgia: Secondary | ICD-10-CM | POA: Insufficient documentation

## 2024-01-16 DIAGNOSIS — M6281 Muscle weakness (generalized): Secondary | ICD-10-CM | POA: Diagnosis not present

## 2024-01-16 DIAGNOSIS — M5416 Radiculopathy, lumbar region: Secondary | ICD-10-CM | POA: Insufficient documentation

## 2024-01-16 DIAGNOSIS — M62838 Other muscle spasm: Secondary | ICD-10-CM | POA: Insufficient documentation

## 2024-01-16 NOTE — Therapy (Signed)
 OUTPATIENT PHYSICAL THERAPY CERVICAL EVALUATION   Patient Name: Brittney Farmer MRN: [id number] DOB:Jan 03, 1986, 38 y.o., female Today's Date: 01/16/2024  END OF SESSION:  PT End of Session - 01/16/24 0938     Visit Number 1    Number of Visits 8    Date for PT Re-Evaluation 02/13/24    PT Start Time 0847    PT Stop Time 0924    PT Time Calculation (min) 37 min    Activity Tolerance Patient tolerated treatment well    Behavior During Therapy WFL for tasks assessed/performed          Past Medical History:  Diagnosis Date   ADD (attention deficit disorder)    ADHD    Anemia    Anxiety    Back pain    Constipation    Depression    GERD (gastroesophageal reflux disease)    IBS (irritable bowel syndrome)    IBS (irritable bowel syndrome)    Palpitations    SOB (shortness of breath) on exertion    Spine curvature, acquired    Stomach ulcer    Past Surgical History:  Procedure Laterality Date   BIOPSY  07/27/2020   Procedure: BIOPSY;  Surgeon: Cindie Carlin POUR, DO;  Location: [facility];  Service: Endoscopy;;   COLONOSCOPY WITH PROPOFOL  N/A 07/27/2020   Procedure: COLONOSCOPY WITH PROPOFOL ;  Surgeon: Cindie Carlin POUR, DO;  Location: [facility];  Service: Endoscopy;  Laterality: N/A;  PM   ESOPHAGOGASTRODUODENOSCOPY (EGD) WITH PROPOFOL  N/A 07/27/2020   Procedure: ESOPHAGOGASTRODUODENOSCOPY (EGD) WITH PROPOFOL ;  Surgeon: Cindie Carlin POUR, DO;  Location: [facility];  Service: Endoscopy;  Laterality: N/A;   HAND SURGERY Left 09/08/2016   [place]   TUBAL LIGATION  03/13/2019   UMBILICAL HERNIA REPAIR N/A 10/20/2021   Procedure: HERNIA REPAIR UMBILICAL ADULT W/ MESH;  Surgeon: Evonnie Dorothyann LABOR, DO;  Location: [facility];  Service: General;  Laterality: N/A;   VAGINAL DELIVERY  03/13/2019   WISDOM TOOTH EXTRACTION     Patient Active Problem List   Diagnosis Date Noted   Neck pain 06/28/2023   BMI 38.0-38.9,adult Current BMI 38.8 08/09/2022   Eating disorder  05/03/2022   Precordial chest pain 01/25/2022   SVT (supraventricular tachycardia) (HCC) 01/24/2022   B12 deficiency 11/11/2021   Insulin  resistance 11/11/2021   Umbilical hernia without obstruction and without gangrene    Rectal bleeding    At risk for activity intolerance 05/07/2020   Other hyperlipidemia 03/05/2020   Vitamin D  deficiency 03/05/2020   Depression 03/05/2020   Other fatigue 02/20/2020   SOB (shortness of breath) on exertion 02/20/2020   Other irritable bowel syndrome 02/20/2020   Absolute anemia 02/20/2020   Mood disorder with emotional eating 02/20/2020   At risk for impaired metabolic function 02/20/2020   Allergic rhinitis 10/31/2019   Gastroesophageal reflux disease without esophagitis 08/08/2019   Migraine 07/12/2019   Obesity Start BMI 41.63 Date 02/20/20 12/22/2014   GAD (generalized anxiety disorder) 06/06/2014   IBS (irritable bowel syndrome) 06/06/2014   REFERRING PROVIDER: Venetia Potters MD  REFERRING DIAG: Cervicalgia.  THERAPY DIAG:  Cervicalgia  Other muscle spasm  Rationale for Evaluation and Treatment: Rehabilitation  ONSET DATE: Ongoing,  SUBJECTIVE:  SUBJECTIVE STATEMENT: The patient presents to the clinic with c/o chronic neck pain, headaches and migraines.  Today, her pain is a low 1-2/10.  Her pain increases with turning her head.  Her job requires that she spends a lot of time at a computer. We discussed the importance of correct.  She describes her pain as an ache and sore.    PERTINENT HISTORY:  H/o LBP.  PAIN:  Are you having pain? Yes: NPRS scale: 1-2/10. Pain location: Neck. Pain description: As above.   Aggravating factors: As above. Relieving factors:    PRECAUTIONS: None  RED FLAGS: None     WEIGHT BEARING RESTRICTIONS:  No  FALLS:  Has patient fallen in last 6 months? No  LIVING ENVIRONMENT: Lives with: lives with their spouse Lives in: House/apartment Has following equipment at home: None  OCCUPATION: Self-employed.  PLOF: Independent  PATIENT GOALS: Not have neck pain and headaches.     OBJECTIVE:   PATIENT SURVEYS:  NDI: 10/50.  POSTURE: rounded shoulders and forward head, decreased lordosis.  PALPATION: Tender to palpation over bilateral upper cervical paraspinal musculature with trigger points with increased tautness.     CERVICAL ROM:   Active right cervical rotation is 70 degrees and left is 60 degrees.  UPPER EXTREMITY ROM:  WNL.   TREATMENT DATE: 01/16/24:  Gentle soft tissue work  x 9 minutes to patient's upper cervical paraspinal musculature.                                                                                                                                PATIENT EDUCATION:  Education details: Discussed correct posture, chin tucks and extension. Person educated: Patient Education method: Explanation Education comprehension: verbalized understanding and returned demonstration  HOME EXERCISE PROGRAM:   ASSESSMENT:  CLINICAL IMPRESSION: The patient presents to [facility] with c/o neck pain and headaches.  She is tender to palpation over bilateral upper cervical paraspinal musculature with trigger points with increased tautness.  She has limited bilateral active cervical rotation.  Her posture is remarkable with some loss of cervical lordosis.  Her NDI score is 10/50.  Patient will benefit from skilled physical therapy intervention.     OBJECTIVE IMPAIRMENTS: decreased activity tolerance, decreased ROM, increased muscle spasms, postural dysfunction, and pain.   ACTIVITY LIMITATIONS: carrying, lifting, and sitting  PARTICIPATION LIMITATIONS: meal prep, cleaning, laundry, and occupation  PERSONAL FACTORS: Time since onset of injury/illness/exacerbation are also  affecting patient's functional outcome.   REHAB POTENTIAL: Good  CLINICAL DECISION MAKING: Stable/uncomplicated  EVALUATION COMPLEXITY: Low   GOALS:  SHORT TERM GOALS: Target date: 02/13/24  Ind with an initial HEP.  Goal status: INITIAL  2.  Increase bilateral cervical rotation to 75 degrees+.  Goal status: INITIAL  3.  Decrease frequency of headaches to no more than one per week and decreased intensity.  Goal status: INITIAL  4.  Improve NDI score by at least 3 points.  Goal status:  INITIAL   PLAN:  PT FREQUENCY: 2x/week  PT DURATION: 4 weeks  PLANNED INTERVENTIONS: 97110-Therapeutic exercises, 97530- Therapeutic activity, [id number]- Neuromuscular re-education, 97535- Self Care, [id number]- Manual therapy, [id number]- Electrical stimulation (unattended), 97035- Ultrasound, Patient/Family education, Cryotherapy, and Moist heat  PLAN FOR NEXT SESSION: Postural exercises, combo e'stim/US , STW/M, modalities as needed.     Yen Wandell, [place], PT 01/16/2024, 11:11 AM

## 2024-01-17 ENCOUNTER — Encounter (HOSPITAL_BASED_OUTPATIENT_CLINIC_OR_DEPARTMENT_OTHER): Payer: Self-pay

## 2024-01-17 ENCOUNTER — Ambulatory Visit (HOSPITAL_BASED_OUTPATIENT_CLINIC_OR_DEPARTMENT_OTHER)

## 2024-01-17 VITALS — BP 107/73 | HR 74 | Ht 63.0 in | Wt 226.0 lb

## 2024-01-17 DIAGNOSIS — G4710 Hypersomnia, unspecified: Secondary | ICD-10-CM | POA: Diagnosis not present

## 2024-01-17 DIAGNOSIS — G4719 Other hypersomnia: Secondary | ICD-10-CM

## 2024-01-17 NOTE — Patient Instructions (Signed)
 Complete sleep test as ordered.  Follow sleep hygiene as discussed; see attached informational packets.  Follow up in 4-6 weeks for sleep study results review.

## 2024-01-17 NOTE — Progress Notes (Signed)
 Epworth Sleepiness Scale  Use the following scale to choose the most appropriate number for each situation. 0 Would never nod off 1  Slight  chance of nodding off 2 Moderate chance of nodding off 3 High chance of nodding off  Sitting and reading: 0 Watching TV: 1 Sitting, inactive, in a public place (e.g., in a meeting, theater, or dinner event): 0 As a passenger in a car for an hour or more without stopping for a break: 2 Lying down to rest when circumstances permit:2 Sitting and talking to someone: 0 Sitting quietly after a meal without alcohol: 0 In a car, while stopped for a few  minutes in traffic or at a light: 0  TOTOAL: 5

## 2024-01-17 NOTE — Progress Notes (Signed)
 @Patient  ID: Brittney Farmer, female    DOB: [birth year], 38 y.o.   MRN: [id number]  Chief Complaint  Patient presents with   Establish Care    New Sleep     Referring provider: Leigh Venetia CROME, MD  HPI: Brittney Farmer is a 38 year old female with past medical history of insulin  resistance on Trulicity , B12 deficiency, migraine headaches, history of SVT, and obesity who presents today as a new patient evaluation for sleep.  She states that she was referred here by her neurologist due to the complaint of daily headaches.  She is currently on treatment for migraines and states that this helps somewhat but she continues to have morning headaches that last throughout the day.  Her significant other has reported to her that she does snore.  She also wakes up short of breath in the night and reports restless sleep.  She feels this is going on for more than 1 year.  She also complains of excessive daytime sleepiness that does affect her daily activities.  She has been on a weight loss program for the last 3 years but notes that her weight has only fluctuated up and down in the 20 to 30 pound range.  She goes to bed on typical day between 9 and 10 PM and wakes up at 6 AM.  She is a never smoker denies alcohol or drug use.  Epworth today is noted to be 5.  She does note some shortness of breath with activity, irregular heartbeats, GERD, indigestion, weight fluctuation, headaches.  She denies fever, chills, night sweats, dizziness, or other complaints.  TEST/EVENTS :   Allergies  Allergen Reactions   Latex     Band aids - causes skin irritation      Immunization History  Administered Date(s) Administered   DTaP 01/12/1988, 12/12/1990, 11/13/1991, 07/21/1992   HIB (PRP-OMP) 12/12/1990, 08/27/1992   HPV Quadrivalent 03/17/2006, 04/14/2006   Hpv-Unspecified 03/17/2006, 04/14/2006   IPV 01/12/1988, 12/12/1990, 11/13/1991, 07/21/1992   Influenza,inj,Quad PF,6+ Mos 01/15/2019   MMR 07/21/1992   Td  10/14/2015   Tdap 01/30/2011, 10/14/2015, 12/17/2018    Past Medical History:  Diagnosis Date   ADD (attention deficit disorder)    ADHD    Anemia    Anxiety    Back pain    Constipation    Depression    GERD (gastroesophageal reflux disease)    IBS (irritable bowel syndrome)    IBS (irritable bowel syndrome)    Palpitations    SOB (shortness of breath) on exertion    Spine curvature, acquired    Stomach ulcer     Tobacco History: Social History   Tobacco Use  Smoking Status Never  Smokeless Tobacco Never   Counseling given: Not Answered   Outpatient Medications Prior to Visit  Medication Sig Dispense Refill   dicyclomine  (BENTYL ) 10 MG capsule TAKE 1 CAPSULE BY MOUTH 4 TIMES DAILY BEFORE MEALS AND AT BEDTIME FOR ABDOMINAL PAIN (Patient taking differently: Take 10 mg by mouth as needed (stomach cramps).) 360 capsule 0   Dulaglutide  (TRULICITY ) 4.5 MG/0.5ML SOAJ Inject 4.5 mg as directed once a week. 2 mL 0   fluticasone  (FLONASE ) 50 MCG/ACT nasal spray Place 2 sprays into both nostrils daily. 16 g 6   ibuprofen  (ADVIL ) 800 MG tablet Take 1 tablet (800 mg total) by mouth every 8 (eight) hours as needed. 30 tablet 0   loratadine  (CLARITIN ) 10 MG tablet Take 1 tablet (10 mg total) by mouth daily. 30 tablet 2  metFORMIN  (GLUCOPHAGE ) 500 MG tablet TAKE 1 TABLETS BY MOUTH TWICE DAILY WITH MEALS 60 tablet 0   omeprazole  (PRILOSEC) 40 MG capsule Take 1 capsule (40 mg total) by mouth daily. 30 capsule 3   ondansetron  (ZOFRAN ) 4 MG tablet Take 1 tablet (4 mg total) by mouth every 8 (eight) hours as needed for nausea or vomiting. TAKE 1 TABLET BY MOUTH EVERY 8 HOURS AS NEEDED FOR NAUSEA AND VOMITING 90 tablet 2   propranolol  (INDERAL ) 20 MG tablet Take 1 tablet (20 mg total) by mouth 2 (two) times daily. 60 tablet 5   SUMAtriptan  (IMITREX ) 100 MG tablet Take 1 tablet (100 mg total) by mouth once as needed for up to 1 dose. May repeat in 2 hours if headache persists or recurs. 10  tablet 5   topiramate  (TOPAMAX ) 50 MG tablet Take 50 mg at bedtime and increase to 100 mg nightly if tolerated. 60 tablet 0   Vitamin D , Ergocalciferol , (DRISDOL ) 1.25 MG (50000 UNIT) CAPS capsule Take 1 capsule (50,000 Units total) by mouth every 14 (fourteen) days. 4 capsule 0   cyclobenzaprine  (FLEXERIL ) 5 MG tablet Take 1 tablet (5 mg total) by mouth 3 (three) times daily as needed for muscle spasms. (Patient not taking: Reported on 01/17/2024) 30 tablet 0   No facility-administered medications prior to visit.     Review of Systems: Positive as mentioned in HPI  Constitutional:   No  weight loss, night sweats,  Fevers, chills, fatigue, or  lassitude.  HEENT:   No headaches,  Difficulty swallowing,  Tooth/dental problems, or  Sore throat,                No sneezing, itching, ear ache, nasal congestion, post nasal drip,   CV:  No chest pain,  Orthopnea, PND, swelling in lower extremities, anasarca, dizziness, palpitations, syncope.   GI  No heartburn, indigestion, abdominal pain, nausea, vomiting, diarrhea, change in bowel habits, loss of appetite, bloody stools.   Resp: No shortness of breath with exertion or at rest.  No excess mucus, no productive cough,  No non-productive cough,  No coughing up of blood.  No change in color of mucus.  No wheezing.  No chest wall deformity  Skin: no rash or lesions.  GU: no dysuria, change in color of urine, no urgency or frequency.  No flank pain, no hematuria   MS:  No joint pain or swelling.  No decreased range of motion.  No back pain.    Physical Exam  BP 107/73   Pulse 74   Ht 5' 3 (1.6 m)   Wt 226 lb (102.5 kg)   LMP 12/18/2023   SpO2 98%   BMI 40.03 kg/m   GEN: A/Ox3; pleasant , NAD, well nourished    HEENT:  [place]/AT,  EACs-clear, TMs-wnl, NOSE-clear, THROAT-clear, no lesions, no postnasal drip or exudate noted.  Mallampati 2  NECK:  Supple w/ fair ROM; no JVD; normal carotid impulses w/o bruits; no thyromegaly or nodules  palpated; no lymphadenopathy.    RESP  Clear  P & A; w/o, wheezes/ rales/ or rhonchi. no accessory muscle use, no dullness to percussion  CARD:  RRR, no m/r/g, no peripheral edema, pulses intact, no cyanosis or clubbing.  GI:   Obese, soft & nt; nml bowel sounds; no organomegaly or masses detected.   Musco: Warm bil, no deformities or joint swelling noted.   Neuro: alert, no focal deficits noted.    Skin: Warm, no lesions or rashes  Lab Results:  CBC    Component Value Date/Time   WBC 7.0 08/15/2023 0936   WBC 7.8 10/18/2021 0820   RBC 4.62 08/15/2023 0936   RBC 4.52 10/18/2021 0820   HGB 13.8 08/15/2023 0936   HCT 41.1 08/15/2023 0936   PLT 322 08/15/2023 0936   MCV 89 08/15/2023 0936   MCH 29.9 08/15/2023 0936   MCH 29.2 10/18/2021 0820   MCHC 33.6 08/15/2023 0936   MCHC 32.7 10/18/2021 0820   RDW 12.6 08/15/2023 0936   LYMPHSABS 1.9 08/15/2023 0936   MONOABS 0.5 10/18/2021 0820   EOSABS 0.1 08/15/2023 0936   BASOSABS 0.1 08/15/2023 0936    BMET    Component Value Date/Time   NA 138 12/12/2023 0913   K 4.7 12/12/2023 0913   CL 103 12/12/2023 0913   CO2 20 12/12/2023 0913   GLUCOSE 76 12/12/2023 0913   GLUCOSE 75 10/04/2012 1238   BUN 9 12/12/2023 0913   CREATININE 0.67 12/12/2023 0913   CREATININE 0.59 10/04/2012 1238   CALCIUM 9.1 12/12/2023 0913   GFRNONAA 111 02/20/2020 1012   GFRNONAA >89 10/04/2012 1238   GFRAA 128 02/20/2020 1012   GFRAA >89 10/04/2012 1238    BNP No results found for: BNP  ProBNP No results found for: PROBNP  Imaging: No results found.  Administration History     None           No data to display          No results found for: NITRICOXIDE   Assessment & Plan:   Assessment & Plan Excessive daytime sleepiness - Symptoms of morning headaches, snoring, PND, and excessive daytime sleepiness are all concerning for sleep apnea. -Plan for home sleep test to further evaluate this; test ordered  today. -Sleep hygiene and the risks of untreated sleep apnea were discussed at today's appointment; informational packet on sleep apnea and the sleep study itself were included with AVS today -  Diet and exercise encouraged for weight management; Zepbound  as a potential aid was discussed at today's appt Plan for follow-up in 4 to 6 weeks to review sleep study results.   Return in about 6 weeks (around 02/28/2024) for sleep study review.  Candis Dandy, PA-C 01/17/2024

## 2024-01-19 ENCOUNTER — Ambulatory Visit: Admitting: Family

## 2024-01-19 ENCOUNTER — Encounter: Payer: Self-pay | Admitting: Family

## 2024-01-19 VITALS — BP 129/85 | HR 79 | Temp 97.7°F | Ht 63.0 in | Wt 226.6 lb

## 2024-01-19 DIAGNOSIS — R110 Nausea: Secondary | ICD-10-CM

## 2024-01-19 DIAGNOSIS — G43109 Migraine with aura, not intractable, without status migrainosus: Secondary | ICD-10-CM | POA: Diagnosis not present

## 2024-01-19 DIAGNOSIS — E7849 Other hyperlipidemia: Secondary | ICD-10-CM

## 2024-01-19 DIAGNOSIS — K219 Gastro-esophageal reflux disease without esophagitis: Secondary | ICD-10-CM

## 2024-01-19 DIAGNOSIS — N921 Excessive and frequent menstruation with irregular cycle: Secondary | ICD-10-CM | POA: Diagnosis not present

## 2024-01-19 DIAGNOSIS — F331 Major depressive disorder, recurrent, moderate: Secondary | ICD-10-CM | POA: Diagnosis not present

## 2024-01-19 DIAGNOSIS — R5383 Other fatigue: Secondary | ICD-10-CM

## 2024-01-19 DIAGNOSIS — F411 Generalized anxiety disorder: Secondary | ICD-10-CM | POA: Diagnosis not present

## 2024-01-19 DIAGNOSIS — E559 Vitamin D deficiency, unspecified: Secondary | ICD-10-CM | POA: Diagnosis not present

## 2024-01-19 MED ORDER — IBUPROFEN 800 MG PO TABS: 800.0000 mg | ORAL_TABLET | Freq: Three times a day (TID) | ORAL | 0 refills | Status: DC | PRN

## 2024-01-19 MED ORDER — ONDANSETRON HCL 4 MG PO TABS: 4.0000 mg | ORAL_TABLET | Freq: Three times a day (TID) | ORAL | 2 refills | Status: DC | PRN

## 2024-01-19 MED ORDER — OMEPRAZOLE 40 MG PO CPDR: 40.0000 mg | DELAYED_RELEASE_CAPSULE | Freq: Every day | ORAL | 3 refills | Status: AC

## 2024-01-19 NOTE — Patient Instructions (Signed)
 Abnormal Uterine Bleeding  Abnormal uterine bleeding is unusual bleeding from the uterus. It includes bleeding after sex, or bleeding or spotting between menstrual periods. It may also include bleeding that is heavier than normal, menstrual periods that last longer than usual, or bleeding that occurs after menopause. Abnormal uterine bleeding can affect teenagers, women in their reproductive years, pregnant women, and women who have reached menopause. Common causes of abnormal uterine bleeding include: Pregnancy. Abnormal growths within the lining of the uterus (polyps). Benign tumors or growths in the uterus (fibroids). These are not cancer. Infection. Cancer. Too much or too little of some hormones in the body (hormonal imbalances). Any type of abnormal bleeding should be checked by a health care provider. Many cases are minor and simple to treat, but others may be more serious. Treatment will depend on the cause of the bleeding and how severe it is. Follow these instructions at home: Medicines Take over-the-counter and prescription medicines only as told by your health care provider. Ask your health care provider about: Taking medicines such as aspirin and ibuprofen. These medicines can thin your blood. Do not take these medicines unless your health care provider tells you to take them. Taking over-the-counter medicines, vitamins, herbs, and supplements. If you were prescribed iron pills, take them as told by your health care provider. Iron pills help to replace iron that your body loses because of this condition. Managing constipation In cases of severe bleeding, you may be asked to increase your iron intake to treat anemia. Doing this may cause constipation. To prevent or treat constipation, you may need to: Drink enough fluid to keep your urine pale yellow. Take over-the-counter or prescription medicines. Eat foods that are high in fiber, such as beans, whole grains, and fresh fruits and  vegetables. Limit foods that are high in fat and processed sugars, such as fried or sweet foods. Activity Alter your activity to decrease bleeding if you need to change your sanitary pad more than one time every 2 hours: Lie in bed with your feet raised (elevated). Place a cold pack on your lower abdomen. Rest as much as possible until the bleeding stops or slows down. General instructions Do not use tampons, douche, or have sex until your health care provider says these things are okay. Change your sanitary pads often. Get regular exams. These include pelvic exams and cervical cancer screenings. It is up to you to get the results of any tests that are done. Ask your health care provider, or the department that is doing the tests, when your results will be ready. Monitor your condition for any changes. For 2 months, write down: When your menstrual period starts. When your menstrual period ends. When any abnormal vaginal bleeding occurs. What problems you notice. Keep all follow-up visits. This is important. Contact a health care provider if: You have bleeding that lasts for more than one week. You feel dizzy at times. You feel nauseous or you vomit. You feel light-headed or weak. You notice any other changes that show that your condition is getting worse. Get help right away if: You faint. You have bleeding that soaks through a sanitary pad every hour. You have pain in the abdomen. You have a fever or chills. You become sweaty or weak. You pass large blood clots from your vagina. These symptoms may represent a serious problem that is an emergency. Do not wait to see if the symptoms will go away. Get medical help right away. Call your local emergency services (  911 in the [place] Do not drive yourself to the hospital. Summary Abnormal uterine bleeding is unusual bleeding from the uterus. Any type of abnormal bleeding should be checked by a health care provider. Many cases are minor and  simple to treat, but others may be more serious. Treatment will depend on the cause of the bleeding and how severe it is. Get help right away if you faint, you have bleeding that soaks through a sanitary pad every hour, or you pass large blood clots from your vagina. This information is not intended to replace advice given to you by your health care provider. Make sure you discuss any questions you have with your health care provider. Document Revised: 11/21/2022 Document Reviewed: 08/18/2020 Elsevier Patient Education  2024 [employer].

## 2024-01-19 NOTE — Progress Notes (Signed)
 Subjective:    Patient ID: Brittney Farmer, female    DOB: 1985/05/19, 38 y.o.   MRN: [id number]  Chief Complaint  Patient presents with   Medication Refill   Weight Management Screening   Menstrual Problem    HAVING 2 PERIODS PER MONTH FOR THE PAST 4-5 MONTHS- HEAVY AND NORMAL FLOWS; INCREASED CRAMPING; SHOULDER AND BACK PAIN    Pt presents to the office today for CPE without pap and chronic follow up.    She is going healthy weight loss clinic. She is morbid obese with a BMI of 40 with depression, GAD, and hyperlipidemia.    She is followed by Cardiologists for palpitations as needed.   She is followed by Neurologists for migraines and was started on propranolol  for this and helps with her palpitations.  She has a sleep study ordered.   She reports having two menstrual cycles the a month that started 5 months ago. She reports 4-5 days of heavy bleeding with clots.  Will use approx 5-6 tampons a day. This is all new for her.  Gastroesophageal Reflux She complains of belching and heartburn. This is a chronic problem. The current episode started more than 1 year ago. The problem occurs occasionally. The problem has been waxing and waning. The symptoms are aggravated by certain foods. Risk factors include obesity. She has tried a PPI for the symptoms. The treatment provided moderate relief.  Anxiety Presents for follow-up visit. Symptoms include excessive worry, irritability, nervous/anxious behavior and restlessness. Symptoms occur most days. The severity of symptoms is mild.    Depression        This is a chronic problem.  The current episode started more than 1 year ago.   The onset quality is gradual.   The problem occurs intermittently.  Associated symptoms include restlessness and sad.  Associated symptoms include no helplessness and no hopelessness.  Past treatments include nothing.  Past medical history includes anxiety.   Anemia Presents for follow-up visit. Symptoms include  malaise/fatigue.      Review of Systems  Constitutional:  Positive for irritability and malaise/fatigue.  Gastrointestinal:  Positive for heartburn.  Psychiatric/Behavioral:  The patient is nervous/anxious.   All other systems reviewed and are negative.  Family History  Problem Relation Age of Onset   Diabetes Mother    Hyperlipidemia Mother    Hypertension Mother    Heart disease Mother    Depression Mother    Anxiety disorder Mother    Alcoholism Mother    Hyperlipidemia Father    Hypertension Father    Alcoholism Father    Celiac disease Neg Hx    Inflammatory bowel disease Neg Hx    Colon cancer Neg Hx    Social History   Socioeconomic History   Marital status: Single    Spouse name: Lamar Epley   Number of children: 4   Years of education: Not on file   Highest education level: Not on file  Occupational History   Occupation: Diplomatic Services operational officer  Tobacco Use   Smoking status: Never   Smokeless tobacco: Never  Vaping Use   Vaping status: Never Used  Substance and Sexual Activity   Alcohol use: No   Drug use: No   Sexual activity: Yes  Other Topics Concern   Not on file  Social History Narrative   Are you right handed or left handed? Right   Are you currently employed ?    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   Who lives with you? family   What type of home do you live in: 1 story or 2 story? one    Caffience 1 or 2 cans a day   Social Drivers of Corporate investment banker Strain: Low Risk  (11/22/2017)   Overall Financial Resource Strain (CARDIA)    Difficulty of Paying Living Expenses: Not hard at all  Food Insecurity: No Food Insecurity (11/22/2017)   Hunger Vital Sign    Worried About Running Out of Food in the Last Year: Never true    Ran Out of Food in the Last Year: Never true  Transportation Needs: No Transportation Needs (12/17/2018)   Received from [facility]   PRAPARE - Transportation    Lack of  Transportation (Medical): No    Lack of Transportation (Non-Medical): No  Physical Activity: Inactive (11/22/2017)   Exercise Vital Sign    Days of Exercise per Week: 0 days    Minutes of Exercise per Session: 0 min  Stress: No Stress Concern Present (12/17/2018)   Received from [facility] - Occupational Stress Questionnaire    Feeling of Stress : Not at all  Social Connections: Somewhat Isolated (11/22/2017)   Social Connection and Isolation Panel    Frequency of Communication with Friends and Family: More than three times a week    Frequency of Social Gatherings with Friends and Family: More than three times a week    Attends Religious Services: Never    Database administrator or Organizations: No    Attends Banker Meetings: Never    Marital Status: Married       Objective:   Physical Exam Vitals reviewed.  Constitutional:      General: She is not in acute distress.    Appearance: She is well-developed. She is obese.  HENT:     Head: Normocephalic and atraumatic.     Right Ear: Tympanic membrane normal.     Left Ear: Tympanic membrane normal.  Eyes:     Pupils: Pupils are equal, round, and reactive to light.  Neck:     Thyroid : No thyromegaly.  Cardiovascular:     Rate and Rhythm: Normal rate and regular rhythm.     Heart sounds: Normal heart sounds. No murmur heard. Pulmonary:     Effort: Pulmonary effort is normal. No respiratory distress.     Breath sounds: Normal breath sounds. No wheezing.  Abdominal:     General: Bowel sounds are normal. There is no distension.     Palpations: Abdomen is soft.     Tenderness: There is no abdominal tenderness.  Musculoskeletal:        General: No tenderness. Normal range of motion.     Cervical back: Normal range of motion and neck supple.  Skin:    General: Skin is warm and dry.  Neurological:     Mental Status: She is alert and oriented to person, place, and time.      Cranial Nerves: No cranial nerve deficit.     Deep Tendon Reflexes: Reflexes are normal and symmetric.  Psychiatric:        Behavior: Behavior normal.        Thought Content: Thought content normal.        Judgment: Judgment normal.       BP 129/85   Pulse 79   Temp 97.7 F (36.5 C)  Ht 5' 3 (1.6 m)   Wt 226 lb 9.6 oz (102.8 kg)   LMP 12/18/2023   SpO2 97%   BMI 40.14 kg/m      Assessment & Plan:  Nanie Dunkleberger comes in today with chief complaint of Medication Refill, Weight Management Screening, and Menstrual Problem (HAVING 2 PERIODS PER MONTH FOR THE PAST 4-5 MONTHS- HEAVY AND NORMAL FLOWS; INCREASED CRAMPING; SHOULDER AND BACK PAIN)   Diagnosis and orders addressed:  1. GAD (generalized anxiety disorder) (Primary)  2. Migraine with aura and without status migrainosus, not intractable Keep follow up with Neurologists  Continue propranolol    3. Gastroesophageal reflux disease without esophagitis - omeprazole  (PRILOSEC) 40 MG capsule; Take 1 capsule (40 mg total) by mouth daily.  Dispense: 90 capsule; Refill: 3  4. Other fatigue  5. Other hyperlipidemia  6. Vitamin D  deficiency  7. Moderate episode of recurrent major depressive disorder (HCC)  8. Nausea - ondansetron  (ZOFRAN ) 4 MG tablet; Take 1 tablet (4 mg total) by mouth every 8 (eight) hours as needed for nausea or vomiting. TAKE 1 TABLET BY MOUTH EVERY 8 HOURS AS NEEDED FOR NAUSEA AND VOMITING  Dispense: 90 tablet; Refill: 2    10. Menorrhagia with irregular cycle Transvaginal pending  - US  Pelvic Complete With Transvaginal; Future    Labs reviewed from last month from healthy weight  Keep follow up with specialists  Transvaginal US  pending, if any polyps or fibroids will do referral to GYN Continue current medications  Health Maintenance reviewed Diet and exercise encouraged  Follow up plan: 6 months    Bari Learn, FNP

## 2024-01-21 ENCOUNTER — Other Ambulatory Visit (INDEPENDENT_AMBULATORY_CARE_PROVIDER_SITE_OTHER): Payer: Self-pay | Admitting: Physician Assistant

## 2024-01-21 DIAGNOSIS — E559 Vitamin D deficiency, unspecified: Secondary | ICD-10-CM

## 2024-01-24 ENCOUNTER — Encounter

## 2024-01-25 ENCOUNTER — Ambulatory Visit (HOSPITAL_BASED_OUTPATIENT_CLINIC_OR_DEPARTMENT_OTHER)
Admission: RE | Admit: 2024-01-25 | Discharge: 2024-01-25 | Disposition: A | Discharge status: Home / Self Care | Source: Ambulatory Visit | Attending: Family | Admitting: Family

## 2024-01-25 DIAGNOSIS — N921 Excessive and frequent menstruation with irregular cycle: Secondary | ICD-10-CM | POA: Insufficient documentation

## 2024-01-26 ENCOUNTER — Encounter: Payer: Self-pay | Admitting: Physical Therapy

## 2024-01-26 ENCOUNTER — Ambulatory Visit: Admitting: Physical Therapy

## 2024-01-26 DIAGNOSIS — M62838 Other muscle spasm: Secondary | ICD-10-CM

## 2024-01-26 DIAGNOSIS — M5416 Radiculopathy, lumbar region: Secondary | ICD-10-CM

## 2024-01-26 DIAGNOSIS — M6281 Muscle weakness (generalized): Secondary | ICD-10-CM | POA: Diagnosis not present

## 2024-01-26 DIAGNOSIS — M542 Cervicalgia: Secondary | ICD-10-CM

## 2024-01-26 NOTE — Therapy (Signed)
 OUTPATIENT PHYSICAL THERAPY CERVICAL TREATMENT   Patient Name: Brittney Farmer MRN: [id number] DOB:Sep 25, 1985, 38 y.o., female Today's Date: 01/26/2024  END OF SESSION:  PT End of Session - 01/26/24 0800     Visit Number 2    Number of Visits 8    Date for Recertification  02/13/24    PT Start Time 0800    PT Stop Time 0840    PT Time Calculation (min) 40 min    Activity Tolerance Patient tolerated treatment well    Behavior During Therapy WFL for tasks assessed/performed           Past Medical History:  Diagnosis Date   ADD (attention deficit disorder)    ADHD    Anemia    Anxiety    Back pain    Constipation    Depression    GERD (gastroesophageal reflux disease)    IBS (irritable bowel syndrome)    IBS (irritable bowel syndrome)    Palpitations    SOB (shortness of breath) on exertion    Spine curvature, acquired    Stomach ulcer    Past Surgical History:  Procedure Laterality Date   BIOPSY  07/27/2020   Procedure: BIOPSY;  Surgeon: Cindie Carlin POUR, DO;  Location: [facility];  Service: Endoscopy;;   COLONOSCOPY WITH PROPOFOL  N/A 07/27/2020   Procedure: COLONOSCOPY WITH PROPOFOL ;  Surgeon: Cindie Carlin POUR, DO;  Location: [facility];  Service: Endoscopy;  Laterality: N/A;  PM   ESOPHAGOGASTRODUODENOSCOPY (EGD) WITH PROPOFOL  N/A 07/27/2020   Procedure: ESOPHAGOGASTRODUODENOSCOPY (EGD) WITH PROPOFOL ;  Surgeon: Cindie Carlin POUR, DO;  Location: [facility];  Service: Endoscopy;  Laterality: N/A;   HAND SURGERY Left 09/08/2016   [place]   TUBAL LIGATION  03/13/2019   UMBILICAL HERNIA REPAIR N/A 10/20/2021   Procedure: HERNIA REPAIR UMBILICAL ADULT W/ MESH;  Surgeon: Evonnie Dorothyann LABOR, DO;  Location: [facility];  Service: General;  Laterality: N/A;   VAGINAL DELIVERY  03/13/2019   WISDOM TOOTH EXTRACTION     Patient Active Problem List   Diagnosis Date Noted   Neck pain 06/28/2023   BMI 38.0-38.9,adult Current BMI 38.8 08/09/2022   Eating disorder  05/03/2022   Precordial chest pain 01/25/2022   SVT (supraventricular tachycardia) 01/24/2022   B12 deficiency 11/11/2021   Insulin  resistance 11/11/2021   Umbilical hernia without obstruction and without gangrene    Rectal bleeding    At risk for activity intolerance 05/07/2020   Other hyperlipidemia 03/05/2020   Vitamin D  deficiency 03/05/2020   Depression 03/05/2020   Other fatigue 02/20/2020   SOB (shortness of breath) on exertion 02/20/2020   Other irritable bowel syndrome 02/20/2020   Absolute anemia 02/20/2020   Mood disorder with emotional eating 02/20/2020   At risk for impaired metabolic function 02/20/2020   Allergic rhinitis 10/31/2019   Gastroesophageal reflux disease without esophagitis 08/08/2019   Migraine 07/12/2019   Obesity Start BMI 41.63 Date 02/20/20 12/22/2014   GAD (generalized anxiety disorder) 06/06/2014   IBS (irritable bowel syndrome) 06/06/2014   REFERRING PROVIDER: Venetia Potters MD  REFERRING DIAG: Cervicalgia.  THERAPY DIAG:  Cervicalgia  Other muscle spasm  Radiculopathy, lumbar region  Muscle weakness (generalized)  Rationale for Evaluation and Treatment: Rehabilitation  ONSET DATE: Ongoing,  SUBJECTIVE:  SUBJECTIVE STATEMENT: Pt reports low pain levels this morning. States last headache was yesterday. Will be getting sleep apnea study to see if this may also be part of her headaches. Feels most of her tightness is on the left of her neck. Headaches mostly occur first in the morning and then later in the afternoon.   PERTINENT HISTORY:  H/o LBP.  PAIN:  Are you having pain? Yes: NPRS scale: 0/10. Pain location: Neck. Pain description: As above.   Aggravating factors: As above. Relieving factors:    PRECAUTIONS: None  RED  FLAGS: None     WEIGHT BEARING RESTRICTIONS: No  FALLS:  Has patient fallen in last 6 months? No  LIVING ENVIRONMENT: Lives with: lives with their spouse Lives in: House/apartment Has following equipment at home: None  OCCUPATION: Self-employed.  PLOF: Independent  PATIENT GOALS: Not have neck pain and headaches.     OBJECTIVE:   PATIENT SURVEYS:  NDI: 10/50.  POSTURE: rounded shoulders and forward head, decreased lordosis.  PALPATION: Tender to palpation over bilateral upper cervical paraspinal musculature with trigger points with increased tautness.     CERVICAL ROM:  Active right cervical rotation is 70 degrees and left is 60 degrees.  UPPER EXTREMITY ROM: WNL.   TREATMENT DATE:  01/26/24 Seated levator scap stretch x30 Seated UT stretch x 30 Seated cervical rotation with towel assist x30 Manual therapy: STM/TPR cervical paraspinals & suboccipital release Supine cervical retraction 10x3 Supine suboccipital self stretch x 30 Self care: Self massage and self suboccipital release with tennis ball; trigger points and corresponding headache patterns Modalities: Premod x15 min bilat cervical paraspinals, intensity to pt tolerance with moist heat  01/16/24:  Gentle soft tissue work  x 9 minutes to patient's upper cervical paraspinal musculature.                                                                                                                                PATIENT EDUCATION:  Education details: Discussed correct posture, chin tucks and extension. Person educated: Patient Education method: Explanation Education comprehension: verbalized understanding and returned demonstration  HOME EXERCISE PROGRAM: Access Code: [email or web address] URL: [email or web address] Date: 01/26/2024 Prepared by: [employer] April Marie Jalesa Thien  Exercises - Supine Suboccipital Release with Tennis Balls  - 1 x daily - 7 x weekly - 5-15 min hold - Sub-Occipital  Cervical Stretch  - 1 x daily - 7 x weekly - 2 sets - 30 sec hold - Seated Levator Scapulae Stretch  - 1 x daily - 7 x weekly - 2 sets - 30 sec hold - Seated Gentle Upper Trapezius Stretch  - 1 x daily - 7 x weekly - 2 sets - 30 sec hold - Supine Cervical Retraction with Towel  - 1 x daily - 7 x weekly - 1 sets - 10 reps - 3 sec hold  Patient Education - TENS Unit  ASSESSMENT:  CLINICAL IMPRESSION: Treatment focused on continuing to improve cervical  tightness with stretches and deep neck muscle stabilization. Provided manual work and education on self care such as heat, self massage and TENS to help manage muscle tightness at home and how it may relate to her headache symptoms. Initiate more postural strengthening next session.   OBJECTIVE IMPAIRMENTS: decreased activity tolerance, decreased ROM, increased muscle spasms, postural dysfunction, and pain.     GOALS:  SHORT TERM GOALS: Target date: 02/13/24  Ind with an initial HEP.  Goal status: INITIAL  2.  Increase bilateral cervical rotation to 75 degrees+.  Goal status: INITIAL  3.  Decrease frequency of headaches to no more than one per week and decreased intensity.  Goal status: INITIAL  4.  Improve NDI score by at least 3 points.  Goal status: INITIAL   PLAN:  PT FREQUENCY: 2x/week  PT DURATION: 4 weeks  PLANNED INTERVENTIONS: 97110-Therapeutic exercises, 97530- Therapeutic activity, [id number]- Neuromuscular re-education, 97535- Self Care, [id number]- Manual therapy, [id number]- Electrical stimulation (unattended), 97035- Ultrasound, Patient/Family education, Cryotherapy, and Moist heat  PLAN FOR NEXT SESSION: Postural exercises, combo e'stim/US , STW/M, modalities as needed.     Taleyah Hillman April Ma L Shanikia Kernodle, PT, DPT 01/26/2024, 8:45 AM

## 2024-02-01 ENCOUNTER — Ambulatory Visit: Payer: Self-pay | Admitting: Family

## 2024-02-01 ENCOUNTER — Ambulatory Visit: Attending: Neurology

## 2024-02-01 DIAGNOSIS — M542 Cervicalgia: Secondary | ICD-10-CM | POA: Insufficient documentation

## 2024-02-01 DIAGNOSIS — M62838 Other muscle spasm: Secondary | ICD-10-CM | POA: Insufficient documentation

## 2024-02-01 NOTE — Therapy (Signed)
 OUTPATIENT PHYSICAL THERAPY CERVICAL TREATMENT   Patient Name: Brittney Farmer MRN: [id number] DOB:12-Feb-1986, 38 y.o., female Today's Date: 02/01/2024  END OF SESSION:  PT End of Session - 02/01/24 0834     Visit Number 3    Number of Visits 8    Date for Recertification  02/13/24    PT Start Time 0800    PT Stop Time 0850    PT Time Calculation (min) 50 min    Activity Tolerance Patient tolerated treatment well    Behavior During Therapy WFL for tasks assessed/performed           Past Medical History:  Diagnosis Date   ADD (attention deficit disorder)    ADHD    Anemia    Anxiety    Back pain    Constipation    Depression    GERD (gastroesophageal reflux disease)    IBS (irritable bowel syndrome)    IBS (irritable bowel syndrome)    Palpitations    SOB (shortness of breath) on exertion    Spine curvature, acquired    Stomach ulcer    Past Surgical History:  Procedure Laterality Date   BIOPSY  07/27/2020   Procedure: BIOPSY;  Surgeon: Cindie Carlin POUR, DO;  [facility];  Service: Endoscopy;;   COLONOSCOPY WITH PROPOFOL  N/A 07/27/2020   Procedure: COLONOSCOPY WITH PROPOFOL ;  Surgeon: Cindie Carlin POUR, DO;  [facility];  Service: Endoscopy;  Laterality: N/A;  PM   ESOPHAGOGASTRODUODENOSCOPY (EGD) WITH PROPOFOL  N/A 07/27/2020   Procedure: ESOPHAGOGASTRODUODENOSCOPY (EGD) WITH PROPOFOL ;  Surgeon: Cindie Carlin POUR, DO;  [facility]: [facility];  Service: Endoscopy;  Laterality: N/A;   HAND SURGERY Left 09/08/2016   Pinky   TUBAL LIGATION  03/13/2019   UMBILICAL HERNIA REPAIR N/A 10/20/2021   Procedure: HERNIA REPAIR UMBILICAL ADULT W/ MESH;  Surgeon: Evonnie Dorothyann LABOR, DO;  Location: [facility];  Service: General;  Laterality: N/A;   VAGINAL DELIVERY  03/13/2019   WISDOM TOOTH EXTRACTION     Patient Active Problem List   Diagnosis Date Noted   Neck pain 06/28/2023   BMI 38.0-38.9,adult Current BMI 38.8 08/09/2022   Eating disorder  05/03/2022   Precordial chest pain 01/25/2022   SVT (supraventricular tachycardia) 01/24/2022   B12 deficiency 11/11/2021   Insulin  resistance 11/11/2021   Umbilical hernia without obstruction and without gangrene    Rectal bleeding    At risk for activity intolerance 05/07/2020   Other hyperlipidemia 03/05/2020   Vitamin D  deficiency 03/05/2020   Depression 03/05/2020   Other fatigue 02/20/2020   SOB (shortness of breath) on exertion 02/20/2020   Other irritable bowel syndrome 02/20/2020   Absolute anemia 02/20/2020   Mood disorder with emotional eating 02/20/2020   At risk for impaired metabolic function 02/20/2020   Allergic rhinitis 10/31/2019   Gastroesophageal reflux disease without esophagitis 08/08/2019   Migraine 07/12/2019   Obesity Start BMI 41.63 Date 02/20/20 12/22/2014   GAD (generalized anxiety disorder) 06/06/2014   IBS (irritable bowel syndrome) 06/06/2014   REFERRING PROVIDER: Venetia Potters MD  REFERRING DIAG: Cervicalgia.  THERAPY DIAG:  Cervicalgia  Other muscle spasm  Rationale for Evaluation and Treatment: Rehabilitation  ONSET DATE: Ongoing,  SUBJECTIVE:  SUBJECTIVE STATEMENT: Pt reports low pain levels this morning. States last headache was yesterday. Will be getting sleep apnea study to see if this may also be part of her headaches. Feels most of her tightness is on the left of her neck. Headaches mostly occur first in the morning and then later in the afternoon.   PERTINENT HISTORY:  H/o LBP.  PAIN:  Are you having pain? Yes: NPRS scale: 0/10. Pain location: Neck. Pain description: As above.   Aggravating factors: As above. Relieving factors:    PRECAUTIONS: None  RED FLAGS: None     WEIGHT BEARING RESTRICTIONS: No  FALLS:  Has patient  fallen in last 6 months? No  LIVING ENVIRONMENT: Lives with: lives with their spouse Lives in: House/apartment Has following equipment at home: None  OCCUPATION: Self-employed.  PLOF: Independent  PATIENT GOALS: Not have neck pain and headaches.     OBJECTIVE:   PATIENT SURVEYS:  NDI: 10/50.  POSTURE: rounded shoulders and forward head, decreased lordosis.  PALPATION: Tender to palpation over bilateral upper cervical paraspinal musculature with trigger points with increased tautness.     CERVICAL ROM:  Active right cervical rotation is 70 degrees and left is 60 degrees.  UPPER EXTREMITY ROM: WNL.   TREATMENT DATE:   02/01/24:  Manual Therapy Soft Tissue Mobilization: Upper traps, STW/M to bil upper traps to decrease pain and tone   Modalities  Date:  Unattended Estim: Cervical, IFC 80-150 Hz, 15 mins, Pain and Tone Combo: Cervical, 1.5 w/cm2, 100%, 12 mins, Pain and Tone Hot Pack: Cervical, 15 mins, Pain and Tone                                   01/26/24 Seated levator scap stretch x30 Seated UT stretch x 30 Seated cervical rotation with towel assist x30 Manual therapy: STM/TPR cervical paraspinals & suboccipital release Supine cervical retraction 10x3 Supine suboccipital self stretch x 30 Self care: Self massage and self suboccipital release with tennis ball; trigger points and corresponding headache patterns Modalities: Premod x15 min bilat cervical paraspinals, intensity to pt tolerance with moist heat  01/16/24:  Gentle soft tissue work  x 9 minutes to patient's upper cervical paraspinal musculature.                                                                                                                                PATIENT EDUCATION:  Education details: Discussed correct posture, chin tucks and extension. Person educated: Patient Education method: Explanation Education comprehension: verbalized understanding and returned  demonstration  HOME EXERCISE PROGRAM: Access Code: [email or web address] URL: [email or web address] Date: 01/26/2024 Prepared by: Gellen April Marie Nonato  Exercises - Supine Suboccipital Release with Tennis Balls  - 1 x daily - 7 x weekly - 5-15 min hold - Sub-Occipital Cervical Stretch  - 1 x daily - 7 x weekly - 2 sets -  30 sec hold - Seated Levator Scapulae Stretch  - 1 x daily - 7 x weekly - 2 sets - 30 sec hold - Seated Gentle Upper Trapezius Stretch  - 1 x daily - 7 x weekly - 2 sets - 30 sec hold - Supine Cervical Retraction with Towel  - 1 x daily - 7 x weekly - 1 sets - 10 reps - 3 sec hold  Patient Education - TENS Unit  ASSESSMENT:  CLINICAL IMPRESSION: Pt arrives for today's treatment session denying any pain, but does report neck stiffness.  Pt states that she is performing exercises and stretches at home and would like for today's session to concentrate on things she cannot do at home.  Normal responses to all modalities performed today.  STW/M performed to bil upper traps, with concentration to right, to decrease pain and tone.  Pt reported decreased stiffness at completion of today's treatment session.  OBJECTIVE IMPAIRMENTS: decreased activity tolerance, decreased ROM, increased muscle spasms, postural dysfunction, and pain.     GOALS:  SHORT TERM GOALS: Target date: 02/13/24  Ind with an initial HEP.  Goal status: INITIAL  2.  Increase bilateral cervical rotation to 75 degrees+.  Goal status: INITIAL  3.  Decrease frequency of headaches to no more than one per week and decreased intensity.  Goal status: INITIAL  4.  Improve NDI score by at least 3 points.  Goal status: INITIAL   PLAN:  PT FREQUENCY: 2x/week  PT DURATION: 4 weeks  PLANNED INTERVENTIONS: 97110-Therapeutic exercises, 97530- Therapeutic activity, [id number]- Neuromuscular re-education, 97535- Self Care, 02859- Manual therapy, G0283- Electrical stimulation (unattended), 97035- Ultrasound,  Patient/Family education, Cryotherapy, and Moist heat  PLAN FOR NEXT SESSION: Postural exercises, combo e'stim/US , STW/M, modalities as needed.     Delon DELENA Gosling, PTA 02/01/2024, 9:03 AM

## 2024-02-05 ENCOUNTER — Ambulatory Visit: Admitting: Physical Therapy

## 2024-02-05 ENCOUNTER — Encounter: Payer: Self-pay | Admitting: Physical Therapy

## 2024-02-05 DIAGNOSIS — M62838 Other muscle spasm: Secondary | ICD-10-CM | POA: Diagnosis not present

## 2024-02-05 DIAGNOSIS — M542 Cervicalgia: Secondary | ICD-10-CM | POA: Diagnosis not present

## 2024-02-05 NOTE — Progress Notes (Deleted)
   SUBJECTIVE: Discussed the use of AI scribe software for clinical note transcription with the patient, who gave verbal consent to proceed.  Chief Complaint: Obesity  Interim History: ***  Dior is here to discuss her progress with her obesity treatment plan. She is on the {HWW Weight Loss Plan:210964005} and states she {CHL AMB IS/IS NOT:210130109} following her eating plan approximately *** % of the time. She states she {CHL AMB IS/IS NOT:210130109} exercising *** minutes *** times per week.   OBJECTIVE: Visit Diagnoses: Problem List Items Addressed This Visit     Obesity Start BMI 41.63 Date 02/20/20   Other hyperlipidemia   Vitamin D  deficiency   B12 deficiency   Insulin  resistance - Primary   Eating disorder    No data recorded No data recorded No data recorded No data recorded   ASSESSMENT AND PLAN:  Diet: Brendan {CHL AMB IS/IS NOT:210130109} currently in the action stage of change. As such, her goal is to {HWW Weight Loss Efforts:210964006}. She {HAS HAS WNU:81165} agreed to {HWW Weight Loss Plan:210964005}.  Exercise: Keyonta has been instructed {HWW Exercise:210964007} for weight loss and overall health benefits.   Behavior Modification:  We discussed the following Behavioral Modification Strategies today: {HWW Behavior Modification:210964008}. We discussed various medication options to help Lydie with her weight loss efforts and we both agreed to ***.  No follow-ups on file.SABRA She was informed of the importance of frequent follow up visits to maximize her success with intensive lifestyle modifications for her multiple health conditions.  Attestation Statements:   Reviewed by clinician on day of visit: allergies, medications, problem list, medical history, surgical history, family history, social history, and previous encounter notes.   Time spent on visit including pre-visit chart review and post-visit care and charting was *** minutes.    Koleson Reifsteck, PA-C

## 2024-02-05 NOTE — Therapy (Signed)
 OUTPATIENT PHYSICAL THERAPY CERVICAL TREATMENT   Patient Name: Brittney Farmer MRN: [id number] DOB:04-11-1986, 38 y.o., female Today's Date: 02/05/2024  END OF SESSION:  PT End of Session - 02/05/24 0845     Visit Number 4    Number of Visits 8    Date for Recertification  02/13/24    PT Start Time 0803    PT Stop Time 0857    PT Time Calculation (min) 54 min    Activity Tolerance Patient tolerated treatment well    Behavior During Therapy WFL for tasks assessed/performed           Past Medical History:  Diagnosis Date   ADD (attention deficit disorder)    ADHD    Anemia    Anxiety    Back pain    Constipation    Depression    GERD (gastroesophageal reflux disease)    IBS (irritable bowel syndrome)    IBS (irritable bowel syndrome)    Palpitations    SOB (shortness of breath) on exertion    Spine curvature, acquired    Stomach ulcer    Past Surgical History:  Procedure Laterality Date   BIOPSY  07/27/2020   Procedure: BIOPSY;  Surgeon: Cindie Carlin POUR, DO;  [facility];  Service: Endoscopy;;   COLONOSCOPY WITH PROPOFOL  N/A 07/27/2020   Procedure: COLONOSCOPY WITH PROPOFOL ;  Surgeon: Cindie Carlin POUR, DO;  [facility];  Service: Endoscopy;  Laterality: N/A;  PM   ESOPHAGOGASTRODUODENOSCOPY (EGD) WITH PROPOFOL  N/A 07/27/2020   Procedure: ESOPHAGOGASTRODUODENOSCOPY (EGD) WITH PROPOFOL ;  Surgeon: Cindie Carlin POUR, DO;  [facility];  Service: Endoscopy;  Laterality: N/A;   HAND SURGERY Left 09/08/2016   Pinky   TUBAL LIGATION  03/13/2019   UMBILICAL HERNIA REPAIR N/A 10/20/2021   Procedure: HERNIA REPAIR UMBILICAL ADULT W/ MESH;  Surgeon: Evonnie Dorothyann LABOR, DO;  Location: [facility];  Service: General;  Laterality: N/A;   VAGINAL DELIVERY  03/13/2019   WISDOM TOOTH EXTRACTION     Patient Active Problem List   Diagnosis Date Noted   Neck pain 06/28/2023   BMI 38.0-38.9,adult Current BMI 38.8 08/09/2022   Eating disorder  05/03/2022   Precordial chest pain 01/25/2022   SVT (supraventricular tachycardia) 01/24/2022   B12 deficiency 11/11/2021   Insulin  resistance 11/11/2021   Umbilical hernia without obstruction and without gangrene    Rectal bleeding    At risk for activity intolerance 05/07/2020   Other hyperlipidemia 03/05/2020   Vitamin D  deficiency 03/05/2020   Depression 03/05/2020   Other fatigue 02/20/2020   SOB (shortness of breath) on exertion 02/20/2020   Other irritable bowel syndrome 02/20/2020   Absolute anemia 02/20/2020   Mood disorder with emotional eating 02/20/2020   At risk for impaired metabolic function 02/20/2020   Allergic rhinitis 10/31/2019   Gastroesophageal reflux disease without esophagitis 08/08/2019   Migraine 07/12/2019   Obesity Start BMI 41.63 Date 02/20/20 12/22/2014   GAD (generalized anxiety disorder) 06/06/2014   IBS (irritable bowel syndrome) 06/06/2014   REFERRING PROVIDER: Venetia Potters MD  REFERRING DIAG: Cervicalgia.  THERAPY DIAG:  Cervicalgia  Other muscle spasm  Rationale for Evaluation and Treatment: Rehabilitation  ONSET DATE: Ongoing,  SUBJECTIVE:  SUBJECTIVE STATEMENT: Doing much better.  PERTINENT HISTORY:  H/o LBP.  PAIN:  Are you having pain? Yes: NPRS scale: /10. Pain location: Neck. Pain description: As above.   Aggravating factors: As above. Relieving factors:    PRECAUTIONS: None  RED FLAGS: None     WEIGHT BEARING RESTRICTIONS: No  FALLS:  Has patient fallen in last 6 months? No  LIVING ENVIRONMENT: Lives with: lives with their spouse Lives in: House/apartment Has following equipment at home: None  OCCUPATION: Self-employed.  PLOF: Independent  PATIENT GOALS: Not have neck pain and headaches.     OBJECTIVE:    PATIENT SURVEYS:  NDI: 10/50.  POSTURE: rounded shoulders and forward head, decreased lordosis.  PALPATION: Tender to palpation over bilateral upper cervical paraspinal musculature with trigger points with increased tautness.     CERVICAL ROM:  Active right cervical rotation is 70 degrees and left is 60 degrees.  UPPER EXTREMITY ROM: WNL.   TREATMENT DATE:   02/05/24:  Combo e'stim/US  at 1.50 w/CM2 x 12 minutes f/b STW/M x 11 minutes f/b HMP and IFC at 80-150 Hz on 40% scan x 20 minutes.  Normal modality resposne following removal of modality.   02/01/24:  Manual Therapy Soft Tissue Mobilization: Upper traps, STW/M to bil upper traps to decrease pain and tone   Modalities  Date:  Unattended Estim: Cervical, IFC 80-150 Hz, 15 mins, Pain and Tone Combo: Cervical, 1.5 w/cm2, 100%, 12 mins, Pain and Tone Hot Pack: Cervical, 15 mins, Pain and Tone    PATIENT EDUCATION:  Education details: Discussed correct posture, chin tucks and extension. Person educated: Patient Education method: Explanation Education comprehension: verbalized understanding and returned demonstration  HOME EXERCISE PROGRAM: Access Code: [email or web address] URL: [email or web address] Date: 01/26/2024 Prepared by: Gellen April Marie Nonato  Exercises - Supine Suboccipital Release with Tennis Balls  - 1 x daily - 7 x weekly - 5-15 min hold - Sub-Occipital Cervical Stretch  - 1 x daily - 7 x weekly - 2 sets - 30 sec hold - Seated Levator Scapulae Stretch  - 1 x daily - 7 x weekly - 2 sets - 30 sec hold - Seated Gentle Upper Trapezius Stretch  - 1 x daily - 7 x weekly - 2 sets - 30 sec hold - Supine Cervical Retraction with Towel  - 1 x daily - 7 x weekly - 1 sets - 10 reps - 3 sec hold  Patient Education - TENS Unit  ASSESSMENT:  CLINICAL IMPRESSION: Patient pleased with ger response to treatments thus far.  No pain reported after treatment.    OBJECTIVE IMPAIRMENTS: decreased activity  tolerance, decreased ROM, increased muscle spasms, postural dysfunction, and pain.     GOALS:  SHORT TERM GOALS: Target date: 02/13/24  Ind with an initial HEP.  Goal status: INITIAL  2.  Increase bilateral cervical rotation to 75 degrees+.  Goal status: INITIAL  3.  Decrease frequency of headaches to no more than one per week and decreased intensity.  Goal status: INITIAL  4.  Improve NDI score by at least 3 points.  Goal status: INITIAL   PLAN:  PT FREQUENCY: 2x/week  PT DURATION: 4 weeks  PLANNED INTERVENTIONS: 97110-Therapeutic exercises, 97530- Therapeutic activity, [id number]- Neuromuscular re-education, 97535- Self Care, 02859- Manual therapy, G0283- Electrical stimulation (unattended), 97035- Ultrasound, Patient/Family education, Cryotherapy, and Moist heat  PLAN FOR NEXT SESSION: Postural exercises, combo e'stim/US , STW/M, modalities as needed.     Alyx Gee, [place], PT 02/05/2024, 8:59 AM

## 2024-02-06 ENCOUNTER — Ambulatory Visit (INDEPENDENT_AMBULATORY_CARE_PROVIDER_SITE_OTHER): Admitting: Physician Assistant

## 2024-02-06 DIAGNOSIS — E559 Vitamin D deficiency, unspecified: Secondary | ICD-10-CM

## 2024-02-06 DIAGNOSIS — F5089 Other specified eating disorder: Secondary | ICD-10-CM

## 2024-02-06 DIAGNOSIS — E88819 Insulin resistance, unspecified: Secondary | ICD-10-CM

## 2024-02-06 DIAGNOSIS — E7849 Other hyperlipidemia: Secondary | ICD-10-CM

## 2024-02-06 DIAGNOSIS — E538 Deficiency of other specified B group vitamins: Secondary | ICD-10-CM

## 2024-02-08 ENCOUNTER — Telehealth: Payer: Self-pay

## 2024-02-08 NOTE — Telephone Encounter (Signed)
 Copied from CRM [id number]. Topic: Clinical - Lab/Test Results >> Feb 07, 2024  4:56 PM Nathanel BROCKS wrote: Reason for CRM: pt has seen on her mychart that her lab and test results are in for her transvaginal ultrasound and she needs to know what it means. She is upset that know one has called. Please cal pt back.

## 2024-02-08 NOTE — Telephone Encounter (Signed)
 Do you want to do the ultrasound in a few weeks for follow up. Patient has not been contacted yet

## 2024-02-09 ENCOUNTER — Encounter

## 2024-02-09 ENCOUNTER — Encounter (HOSPITAL_BASED_OUTPATIENT_CLINIC_OR_DEPARTMENT_OTHER)

## 2024-02-09 ENCOUNTER — Telehealth: Payer: Self-pay | Admitting: Family Medicine

## 2024-02-09 DIAGNOSIS — N83202 Unspecified ovarian cyst, left side: Secondary | ICD-10-CM

## 2024-02-09 DIAGNOSIS — G4719 Other hypersomnia: Secondary | ICD-10-CM

## 2024-02-09 NOTE — Telephone Encounter (Unsigned)
 Copied from CRM [id number]. Topic: Clinical - Lab/Test Results >> Feb 07, 2024  4:56 PM Nathanel BROCKS wrote: Reason for CRM: pt has seen on her mychart that her lab and test results are in for her transvaginal ultrasound and she needs to know what it means. She is upset that know one has called. Please cal pt back. >> Feb 09, 2024  9:03 AM Montie POUR wrote: Ambreen would like a nurse to call her to discuss the small ovarian cyst that noted on her imaging for US  Pelvic Complete with Transvaginal. Aiysha is a little upset that no one has called her. Her number is [phone]. Thanks

## 2024-02-09 NOTE — Telephone Encounter (Signed)
 Called and left message on voicemail. Repeat US  ordered for 8 weeks.

## 2024-02-09 NOTE — Telephone Encounter (Signed)
 Copied from CRM [id number]. Topic: Clinical - Lab/Test Results >> Feb 07, 2024  4:56 PM Nathanel BROCKS wrote: Reason for CRM: pt has seen on her mychart that her lab and test results are in for her transvaginal ultrasound and she needs to know what it means. She is upset that know one has called. Please cal pt back. >> Feb 09, 2024  9:03 AM Montie POUR wrote: Brittney Farmer would like a nurse to call her to discuss the small ovarian cyst that noted on her imaging for US  Pelvic Complete with Transvaginal. Brittney Farmer is a little upset that no one has called her. Her number is [phone]. Thanks

## 2024-02-12 ENCOUNTER — Telehealth: Payer: Self-pay | Admitting: Family Medicine

## 2024-02-12 NOTE — Telephone Encounter (Signed)
 Copied from CRM [id number]. Topic: Clinical - Lab/Test Results >> Feb 12, 2024  9:23 AM Diannia H wrote: Reason for CRM: Patient was returning a call for [facility] FNP, could you please give her a call back at [phone].

## 2024-02-12 NOTE — Telephone Encounter (Signed)
 Called and left patient a message to let her know hawks was not here but if she wanted to talk to me I could help her. Other wise Bari will call her back tomorrow

## 2024-02-13 ENCOUNTER — Ambulatory Visit: Admitting: Physical Therapy

## 2024-02-13 DIAGNOSIS — M62838 Other muscle spasm: Secondary | ICD-10-CM

## 2024-02-13 DIAGNOSIS — M542 Cervicalgia: Secondary | ICD-10-CM

## 2024-02-13 NOTE — Telephone Encounter (Signed)
 Called and discussed with patient. Will cancel US  on Monday at [facility]. Will reschedule US  in 8 weeks at [facility].    Bari Learn, FNP

## 2024-02-13 NOTE — Therapy (Signed)
 OUTPATIENT PHYSICAL THERAPY CERVICAL TREATMENT   Patient Name: Brittney Farmer MRN: [id number] DOB:1985/10/09, 38 y.o., female Today's Date: 02/13/2024  END OF SESSION:  PT End of Session - 02/13/24 1005     Visit Number 5    Number of Visits 8    Date for Recertification  02/13/24    PT Start Time 0801    PT Stop Time 0854    PT Time Calculation (min) 53 min    Activity Tolerance Patient tolerated treatment well    Behavior During Therapy WFL for tasks assessed/performed           Past Medical History:  Diagnosis Date   ADD (attention deficit disorder)    ADHD    Anemia    Anxiety    Back pain    Constipation    Depression    GERD (gastroesophageal reflux disease)    IBS (irritable bowel syndrome)    IBS (irritable bowel syndrome)    Palpitations    SOB (shortness of breath) on exertion    Spine curvature, acquired    Stomach ulcer    Past Surgical History:  Procedure Laterality Date   BIOPSY  07/27/2020   Procedure: BIOPSY;  Surgeon: Cindie Carlin POUR, DO;  [facility]: [facility];  Service: Endoscopy;;   COLONOSCOPY WITH PROPOFOL  N/A 07/27/2020   Procedure: COLONOSCOPY WITH PROPOFOL ;  Surgeon: Cindie Carlin POUR, DO;  Location: [facility];  Service: Endoscopy;  Laterality: N/A;  PM   ESOPHAGOGASTRODUODENOSCOPY (EGD) WITH PROPOFOL  N/A 07/27/2020   Procedure: ESOPHAGOGASTRODUODENOSCOPY (EGD) WITH PROPOFOL ;  Surgeon: Cindie Carlin POUR, DO;  Location: [facility];  Service: Endoscopy;  Laterality: N/A;   HAND SURGERY Left 09/08/2016   Pinky   TUBAL LIGATION  03/13/2019   UMBILICAL HERNIA REPAIR N/A 10/20/2021   Procedure: HERNIA REPAIR UMBILICAL ADULT W/ MESH;  Surgeon: Evonnie Dorothyann LABOR, DO;  Location: [facility];  Service: General;  Laterality: N/A;   VAGINAL DELIVERY  03/13/2019   WISDOM TOOTH EXTRACTION     Patient Active Problem List   Diagnosis Date Noted   Neck pain 06/28/2023   BMI 38.0-38.9,adult Current BMI 38.8 08/09/2022   Eating disorder  05/03/2022   Precordial chest pain 01/25/2022   SVT (supraventricular tachycardia) 01/24/2022   B12 deficiency 11/11/2021   Insulin  resistance 11/11/2021   Umbilical hernia without obstruction and without gangrene    Rectal bleeding    At risk for activity intolerance 05/07/2020   Other hyperlipidemia 03/05/2020   Vitamin D  deficiency 03/05/2020   Depression 03/05/2020   Other fatigue 02/20/2020   SOB (shortness of breath) on exertion 02/20/2020   Other irritable bowel syndrome 02/20/2020   Absolute anemia 02/20/2020   Mood disorder with emotional eating 02/20/2020   At risk for impaired metabolic function 02/20/2020   Allergic rhinitis 10/31/2019   Gastroesophageal reflux disease without esophagitis 08/08/2019   Migraine 07/12/2019   Obesity Start BMI 41.63 Date 02/20/20 12/22/2014   GAD (generalized anxiety disorder) 06/06/2014   IBS (irritable bowel syndrome) 06/06/2014   REFERRING PROVIDER: Venetia Potters MD  REFERRING DIAG: Cervicalgia.  THERAPY DIAG:  No diagnosis found.  Rationale for Evaluation and Treatment: Rehabilitation  ONSET DATE: Ongoing,  SUBJECTIVE:  SUBJECTIVE STATEMENT: Treatments are helping a lot. PERTINENT HISTORY:  H/o LBP.  PAIN:  Are you having pain? Yes: NPRS scale: /10. Pain location: Neck. Pain description: As above.   Aggravating factors: As above. Relieving factors:    PRECAUTIONS: None  RED FLAGS: None     WEIGHT BEARING RESTRICTIONS: No  FALLS:  Has patient fallen in last 6 months? No  LIVING ENVIRONMENT: Lives with: lives with their spouse Lives in: House/apartment Has following equipment at home: None  OCCUPATION: Self-employed.  PLOF: Independent  PATIENT GOALS: Not have neck pain and headaches.     OBJECTIVE:    PATIENT SURVEYS:  NDI: 10/50.  POSTURE: rounded shoulders and forward head, decreased lordosis.  PALPATION: Tender to palpation over bilateral upper cervical paraspinal musculature with trigger points with increased tautness.     CERVICAL ROM:  Active right cervical rotation is 70 degrees and left is 60 degrees.  UPPER EXTREMITY ROM: WNL.   TREATMENT DATE:   02/13/24:  Combo e'stim/US  at 1.50 w/CM2 x 12 minutes f/b STW/M x 2 minutes f/b HMP and IFC at 80-150 Hz on 40% scan x 20 minutes.  Normal modality resposne following removal of modality.   02/05/24:  Combo e'stim/US  at 1.50 w/CM2 x 12 minutes f/b STW/M x 11 minutes f/b HMP and IFC at 80-150 Hz on 40% scan x 20 minutes.  Normal modality resposne following removal of modality.   02/01/24:  Manual Therapy Soft Tissue Mobilization: Upper traps, STW/M to bil upper traps to decrease pain and tone   Modalities  Date:  Unattended Estim: Cervical, IFC 80-150 Hz, 15 mins, Pain and Tone Combo: Cervical, 1.5 w/cm2, 100%, 12 mins, Pain and Tone Hot Pack: Cervical, 15 mins, Pain and Tone    PATIENT EDUCATION:  Education details: Discussed correct posture, chin tucks and extension. Person educated: Patient Education method: Explanation Education comprehension: verbalized understanding and returned demonstration  HOME EXERCISE PROGRAM: Access Code: [email or web address] URL: [email or web address] Date: 01/26/2024 Prepared by: Gellen April Marie Nonato  Exercises - Supine Suboccipital Release with Tennis Balls  - 1 x daily - 7 x weekly - 5-15 min hold - Sub-Occipital Cervical Stretch  - 1 x daily - 7 x weekly - 2 sets - 30 sec hold - Seated Levator Scapulae Stretch  - 1 x daily - 7 x weekly - 2 sets - 30 sec hold - Seated Gentle Upper Trapezius Stretch  - 1 x daily - 7 x weekly - 2 sets - 30 sec hold - Supine Cervical Retraction with Towel  - 1 x daily - 7 x weekly - 1 sets - 10 reps - 3 sec hold  Patient Education -  TENS Unit  ASSESSMENT:  CLINICAL IMPRESSION: Patient pleased with her response to treatments thus far.  No pain reported after treatment and no significant headache recently.  3 visits remaining.     OBJECTIVE IMPAIRMENTS: decreased activity tolerance, decreased ROM, increased muscle spasms, postural dysfunction, and pain.     GOALS:  SHORT TERM GOALS: Target date: 02/13/24  Ind with an initial HEP.  Goal status: INITIAL  2.  Increase bilateral cervical rotation to 75 degrees+.  Goal status: INITIAL  3.  Decrease frequency of headaches to no more than one per week and decreased intensity.  Goal status: INITIAL  4.  Improve NDI score by at least 3 points.  Goal status: INITIAL   PLAN:  PT FREQUENCY: 2x/week  PT DURATION: 4 weeks  PLANNED INTERVENTIONS: 97110-Therapeutic exercises, 97530- Therapeutic activity, 97112-  Neuromuscular re-education, [id number]- Self Care, 02859- Manual therapy, G0283- Electrical stimulation (unattended), 97035- Ultrasound, Patient/Family education, Cryotherapy, and Moist heat  PLAN FOR NEXT SESSION: Postural exercises, combo e'stim/US , STW/M, modalities as needed.     Erleen Egner, [place], PT 02/13/2024, 10:08 AM

## 2024-02-16 ENCOUNTER — Ambulatory Visit: Admitting: Neurology

## 2024-02-19 ENCOUNTER — Ambulatory Visit (HOSPITAL_COMMUNITY)

## 2024-02-20 ENCOUNTER — Encounter: Payer: Self-pay | Admitting: Physical Therapy

## 2024-02-20 ENCOUNTER — Ambulatory Visit: Admitting: Physical Therapy

## 2024-02-20 DIAGNOSIS — M542 Cervicalgia: Secondary | ICD-10-CM

## 2024-02-20 DIAGNOSIS — M62838 Other muscle spasm: Secondary | ICD-10-CM | POA: Diagnosis not present

## 2024-02-20 NOTE — Therapy (Signed)
 OUTPATIENT PHYSICAL THERAPY CERVICAL TREATMENT   Patient Name: Brittney Farmer MRN: [id number] DOB:06-18-1985, 38 y.o., female Today's Date: 02/20/2024  END OF SESSION:  PT End of Session - 02/20/24 0941     Visit Number 6    Number of Visits 8    Date for Recertification  02/13/24    PT Start Time 0808    PT Stop Time 0900    PT Time Calculation (min) 52 min    Activity Tolerance Patient tolerated treatment well    Behavior During Therapy WFL for tasks assessed/performed           Past Medical History:  Diagnosis Date   ADD (attention deficit disorder)    ADHD    Anemia    Anxiety    Back pain    Constipation    Depression    GERD (gastroesophageal reflux disease)    IBS (irritable bowel syndrome)    IBS (irritable bowel syndrome)    Palpitations    SOB (shortness of breath) on exertion    Spine curvature, acquired    Stomach ulcer    Past Surgical History:  Procedure Laterality Date   BIOPSY  07/27/2020   Procedure: BIOPSY;  Surgeon: Cindie Carlin POUR, DO;  [facility]: [facility];  Service: Endoscopy;;   COLONOSCOPY WITH PROPOFOL  N/A 07/27/2020   Procedure: COLONOSCOPY WITH PROPOFOL ;  Surgeon: Cindie Carlin POUR, DO;  [facility]: [facility];  Service: Endoscopy;  Laterality: N/A;  PM   ESOPHAGOGASTRODUODENOSCOPY (EGD) WITH PROPOFOL  N/A 07/27/2020   Procedure: ESOPHAGOGASTRODUODENOSCOPY (EGD) WITH PROPOFOL ;  Surgeon: Cindie Carlin POUR, DO;  [facility]: [facility];  Service: Endoscopy;  Laterality: N/A;   HAND SURGERY Left 09/08/2016   Pinky   TUBAL LIGATION  03/13/2019   UMBILICAL HERNIA REPAIR N/A 10/20/2021   Procedure: HERNIA REPAIR UMBILICAL ADULT W/ MESH;  Surgeon: Evonnie Dorothyann LABOR, DO;  Location: [facility];  Service: General;  Laterality: N/A;   VAGINAL DELIVERY  03/13/2019   WISDOM TOOTH EXTRACTION     Patient Active Problem List   Diagnosis Date Noted   Neck pain 06/28/2023   BMI 38.0-38.9,adult Current BMI 38.8 08/09/2022   Eating disorder  05/03/2022   Precordial chest pain 01/25/2022   SVT (supraventricular tachycardia) 01/24/2022   B12 deficiency 11/11/2021   Insulin  resistance 11/11/2021   Umbilical hernia without obstruction and without gangrene    Rectal bleeding    At risk for activity intolerance 05/07/2020   Other hyperlipidemia 03/05/2020   Vitamin D  deficiency 03/05/2020   Depression 03/05/2020   Other fatigue 02/20/2020   SOB (shortness of breath) on exertion 02/20/2020   Other irritable bowel syndrome 02/20/2020   Absolute anemia 02/20/2020   Mood disorder with emotional eating 02/20/2020   At risk for impaired metabolic function 02/20/2020   Allergic rhinitis 10/31/2019   Gastroesophageal reflux disease without esophagitis 08/08/2019   Migraine 07/12/2019   Obesity Start BMI 41.63 Date 02/20/20 12/22/2014   GAD (generalized anxiety disorder) 06/06/2014   IBS (irritable bowel syndrome) 06/06/2014   REFERRING PROVIDER: Venetia Potters MD  REFERRING DIAG: Cervicalgia.  THERAPY DIAG:  No diagnosis found.  Rationale for Evaluation and Treatment: Rehabilitation  ONSET DATE: Ongoing,  SUBJECTIVE:  SUBJECTIVE STATEMENT: Doing well but had some headaches last week.  PERTINENT HISTORY:  H/o LBP.  PAIN:  Are you having pain? Yes: NPRS scale: /10. Pain location: Neck. Pain description: As above.   Aggravating factors: As above. Relieving factors:    PRECAUTIONS: None  RED FLAGS: None     WEIGHT BEARING RESTRICTIONS: No  FALLS:  Has patient fallen in last 6 months? No  LIVING ENVIRONMENT: Lives with: lives with their spouse Lives in: House/apartment Has following equipment at home: None  OCCUPATION: Self-employed.  PLOF: Independent  PATIENT GOALS: Not have neck pain and headaches.      OBJECTIVE:   PATIENT SURVEYS:  NDI: 10/50.  POSTURE: rounded shoulders and forward head, decreased lordosis.  PALPATION: Tender to palpation over bilateral upper cervical paraspinal musculature with trigger points with increased tautness.     CERVICAL ROM:  Active right cervical rotation is 70 degrees and left is 60 degrees.  UPPER EXTREMITY ROM: WNL.   TREATMENT DATE:   02/20/24:  STW/M x 24 minutes to patient's bilateral cervical paraspinal musculature f/b HMP and IFC at 80-150 Hz on 40% scan x  02/13/24:  Combo e'stim/US  at 1.50 w/CM2 x 12 minutes f/b STW/M x 2 minutes f/b HMP and IFC at 80-150 Hz on 40% scan x 20 minutes.  Normal modality resposne following removal of modality.   02/05/24:  Combo e'stim/US  at 1.50 w/CM2 x 12 minutes f/b STW/M x 11 minutes f/b HMP and IFC at 80-150 Hz on 40% scan x 20 minutes.  Normal modality resposne following removal of modality.   PATIENT EDUCATION:  Education details: Discussed correct posture, chin tucks and extension. Person educated: Patient Education method: Explanation Education comprehension: verbalized understanding and returned demonstration  HOME EXERCISE PROGRAM: Access Code: [email or web address] URL: [email or web address] Date: 01/26/2024 Prepared by: Gellen April Marie Nonato  Exercises - Supine Suboccipital Release with Tennis Balls  - 1 x daily - 7 x weekly - 5-15 min hold - Sub-Occipital Cervical Stretch  - 1 x daily - 7 x weekly - 2 sets - 30 sec hold - Seated Levator Scapulae Stretch  - 1 x daily - 7 x weekly - 2 sets - 30 sec hold - Seated Gentle Upper Trapezius Stretch  - 1 x daily - 7 x weekly - 2 sets - 30 sec hold - Supine Cervical Retraction with Towel  - 1 x daily - 7 x weekly - 1 sets - 10 reps - 3 sec hold  Patient Education - TENS Unit  ASSESSMENT:  CLINICAL IMPRESSION: Patient pleased with progress. Trigger points in upper cervical musculature reproduce headaches.     OBJECTIVE  IMPAIRMENTS: decreased activity tolerance, decreased ROM, increased muscle spasms, postural dysfunction, and pain.     GOALS:  SHORT TERM GOALS: Target date: 02/13/24  Ind with an initial HEP.  Goal status: INITIAL  2.  Increase bilateral cervical rotation to 75 degrees+.  Goal status: INITIAL  3.  Decrease frequency of headaches to no more than one per week and decreased intensity.  Goal status: INITIAL  4.  Improve NDI score by at least 3 points.  Goal status: INITIAL   PLAN:  PT FREQUENCY: 2x/week  PT DURATION: 4 weeks  PLANNED INTERVENTIONS: 97110-Therapeutic exercises, 97530- Therapeutic activity, [id number]- Neuromuscular re-education, 97535- Self Care, 02859- Manual therapy, G0283- Electrical stimulation (unattended), 97035- Ultrasound, Patient/Family education, Cryotherapy, and Moist heat  PLAN FOR NEXT SESSION: Postural exercises, combo e'stim/US , STW/M, modalities as needed.     Molley Houser, [place], PT 02/20/2024,  9:42 AM

## 2024-02-26 DIAGNOSIS — R069 Unspecified abnormalities of breathing: Secondary | ICD-10-CM | POA: Diagnosis not present

## 2024-02-27 ENCOUNTER — Encounter (HOSPITAL_BASED_OUTPATIENT_CLINIC_OR_DEPARTMENT_OTHER): Payer: Self-pay

## 2024-02-27 ENCOUNTER — Ambulatory Visit (HOSPITAL_BASED_OUTPATIENT_CLINIC_OR_DEPARTMENT_OTHER)

## 2024-02-27 VITALS — BP 112/67 | HR 83 | Ht 63.0 in | Wt 232.0 lb

## 2024-02-27 DIAGNOSIS — G4719 Other hypersomnia: Secondary | ICD-10-CM | POA: Diagnosis not present

## 2024-02-27 DIAGNOSIS — G43109 Migraine with aura, not intractable, without status migrainosus: Secondary | ICD-10-CM | POA: Diagnosis not present

## 2024-02-27 NOTE — Progress Notes (Signed)
 @Patient  ID: Brittney Farmer, female    DOB: April 12, 1986, 38 y.o.   MRN: [id number]  Chief Complaint  Patient presents with   Follow-up    Sleep     Referring provider: Lavell Bari LABOR, FNP  HPI: Discussed the use of AI scribe software for clinical note transcription with the patient, who gave verbal consent to proceed.  History of Present Illness Brittney Farmer is a 38 year old female who presents with concerns about sleep disturbances and potential sleep apnea. She was referred by neurology for evaluation of her sleep disturbances.  She experiences ongoing sleep disturbances characterized by waking up gasping for air, heart racing upon awakening, frequent twitching during sleep, and waking up with a dry mouth. She also has excessive daytime sleepiness, morning headaches, and persistent tiredness. Recently, she woke herself up snoring, which was a new and concerning experience for her.  She completed a home sleep study, which involved wearing multiple sensors, including a watch, chest band, and nasal cannula. She found the nasal cannula particularly uncomfortable as it rubbed against her nose throughout the night. The study showed snoring, and the apnea-hypopnea index (AHI) was 3.2 events per hour.  She has a history of migraines and is currently taking medication for them, although she continues to experience headaches.  She has been seeking treatment for weight management for four years, but insurance has not covered any approved medications for her condition. Her social history includes recent travel to the beach, where she experienced an episode of waking herself up snoring.     TEST/EVENTS : 02/09/2024 HST:  AHI (4%) 3.2/hr; O2 sat nadir 91%; normal breathing and normal desaturations  Allergies  Allergen Reactions   Latex     Band aids - causes skin irritation      Immunization History  Administered Date(s) Administered   DTaP 01/12/1988, 12/12/1990, 11/13/1991,  07/21/1992   HIB (PRP-OMP) 12/12/1990, 08/27/1992   HPV Quadrivalent 03/17/2006, 04/14/2006   Hpv-Unspecified 03/17/2006, 04/14/2006   IPV 01/12/1988, 12/12/1990, 11/13/1991, 07/21/1992   Influenza,inj,Quad PF,6+ Mos 01/15/2019   MMR 07/21/1992   Td 10/14/2015   Tdap 01/30/2011, 10/14/2015, 12/17/2018    Past Medical History:  Diagnosis Date   ADD (attention deficit disorder)    ADHD    Anemia    Anxiety    Back pain    Constipation    Depression    GERD (gastroesophageal reflux disease)    IBS (irritable bowel syndrome)    IBS (irritable bowel syndrome)    Palpitations    SOB (shortness of breath) on exertion    Spine curvature, acquired    Stomach ulcer     Tobacco History: Social History   Tobacco Use  Smoking Status Never  Smokeless Tobacco Never   Counseling given: Not Answered   Outpatient Medications Prior to Visit  Medication Sig Dispense Refill   dicyclomine  (BENTYL ) 10 MG capsule TAKE 1 CAPSULE BY MOUTH 4 TIMES DAILY BEFORE MEALS AND AT BEDTIME FOR ABDOMINAL PAIN (Patient taking differently: Take 10 mg by mouth as needed (stomach cramps).) 360 capsule 0   Dulaglutide  (TRULICITY ) 4.5 MG/0.5ML SOAJ Inject 4.5 mg as directed once a week. 2 mL 0   fluticasone  (FLONASE ) 50 MCG/ACT nasal spray Place 2 sprays into both nostrils daily. 16 g 6   ibuprofen  (ADVIL ) 800 MG tablet Take 1 tablet (800 mg total) by mouth every 8 (eight) hours as needed. 90 tablet 0   loratadine  (CLARITIN ) 10 MG tablet Take 1 tablet (10 mg total)  by mouth daily. 30 tablet 2   metFORMIN  (GLUCOPHAGE ) 500 MG tablet TAKE 1 TABLETS BY MOUTH TWICE DAILY WITH MEALS 60 tablet 0   omeprazole  (PRILOSEC) 40 MG capsule Take 1 capsule (40 mg total) by mouth daily. 90 capsule 3   ondansetron  (ZOFRAN ) 4 MG tablet Take 1 tablet (4 mg total) by mouth every 8 (eight) hours as needed for nausea or vomiting. TAKE 1 TABLET BY MOUTH EVERY 8 HOURS AS NEEDED FOR NAUSEA AND VOMITING 90 tablet 2   propranolol   (INDERAL ) 20 MG tablet Take 1 tablet (20 mg total) by mouth 2 (two) times daily. 60 tablet 5   SUMAtriptan  (IMITREX ) 100 MG tablet Take 1 tablet (100 mg total) by mouth once as needed for up to 1 dose. May repeat in 2 hours if headache persists or recurs. 10 tablet 5   topiramate  (TOPAMAX ) 50 MG tablet Take 50 mg at bedtime and increase to 100 mg nightly if tolerated. 60 tablet 0   Vitamin D , Ergocalciferol , (DRISDOL ) 1.25 MG (50000 UNIT) CAPS capsule Take 1 capsule (50,000 Units total) by mouth every 14 (fourteen) days. 4 capsule 0   No facility-administered medications prior to visit.     Review of Systems: as per HPI  Constitutional:   No  weight loss, night sweats,  Fevers, chills, fatigue, or  lassitude.  HEENT:   No headaches,  Difficulty swallowing,  Tooth/dental problems, or  Sore throat,                No sneezing, itching, ear ache, nasal congestion, post nasal drip,   CV:  No chest pain,  Orthopnea, PND, swelling in lower extremities, anasarca, dizziness, palpitations, syncope.   GI  No heartburn, indigestion, abdominal pain, nausea, vomiting, diarrhea, change in bowel habits, loss of appetite, bloody stools.   Resp: No shortness of breath with exertion or at rest.  No excess mucus, no productive cough,  No non-productive cough,  No coughing up of blood.  No change in color of mucus.  No wheezing.  No chest wall deformity  Skin: no rash or lesions.  GU: no dysuria, change in color of urine, no urgency or frequency.  No flank pain, no hematuria   MS:  No joint pain or swelling.  No decreased range of motion.  No back pain.    Physical Exam  BP 112/67   Pulse 83   Ht 5' 3 (1.6 m)   Wt 232 lb (105.2 kg)   SpO2 99%   BMI 41.10 kg/m   GEN: A/Ox3; pleasant , NAD, well nourished    HEENT:  [place]/AT,  EACs-clear, TMs-wnl, NOSE-clear, THROAT-clear, no lesions, no postnasal drip or exudate noted. Mallampati 2  NECK:  Supple w/ fair ROM; no JVD; normal carotid impulses w/o  bruits; no thyromegaly or nodules palpated; no lymphadenopathy.    RESP  Clear  P & A; w/o, wheezes/ rales/ or rhonchi. no accessory muscle use, no dullness to percussion  CARD:  RRR, no m/r/g, no peripheral edema, pulses intact, no cyanosis or clubbing.  GI:   obese, soft & nt; nml bowel sounds; no organomegaly or masses detected.   Musco: Warm bil, no deformities or joint swelling noted.   Neuro: alert, no focal deficits noted.    Skin: Warm, no lesions or rashes    Lab Results:  CBC    Component Value Date/Time   WBC 7.0 08/15/2023 0936   WBC 7.8 10/18/2021 0820   RBC 4.62 08/15/2023 0936  RBC 4.52 10/18/2021 0820   HGB 13.8 08/15/2023 0936   HCT 41.1 08/15/2023 0936   PLT 322 08/15/2023 0936   MCV 89 08/15/2023 0936   MCH 29.9 08/15/2023 0936   MCH 29.2 10/18/2021 0820   MCHC 33.6 08/15/2023 0936   MCHC 32.7 10/18/2021 0820   RDW 12.6 08/15/2023 0936   LYMPHSABS 1.9 08/15/2023 0936   MONOABS 0.5 10/18/2021 0820   EOSABS 0.1 08/15/2023 0936   BASOSABS 0.1 08/15/2023 0936    BMET    Component Value Date/Time   NA 138 12/12/2023 0913   K 4.7 12/12/2023 0913   CL 103 12/12/2023 0913   CO2 20 12/12/2023 0913   GLUCOSE 76 12/12/2023 0913   GLUCOSE 75 10/04/2012 1238   BUN 9 12/12/2023 0913   CREATININE 0.67 12/12/2023 0913   CREATININE 0.59 10/04/2012 1238   CALCIUM 9.1 12/12/2023 0913   GFRNONAA 111 02/20/2020 1012   GFRNONAA >89 10/04/2012 1238   GFRAA 128 02/20/2020 1012   GFRAA >89 10/04/2012 1238    BNP No results found for: BNP  ProBNP No results found for: PROBNP  Imaging: No results found.  Administration History     None           No data to display          No results found for: NITRICOXIDE   Assessment & Plan:   Assessment & Plan Excessive daytime sleepiness  Assessment and Plan Assessment & Plan Excessive daytime sleepiness Home sleep study showed AHI of 3.2, indicating no significant sleep apnea. Symptoms  include waking up gasping, heart racing, twitching, dry mouth, morning headaches, and snoring. Discussed potential for in-lab study to further evaluate.  Patient would like to wait for now. - Plan to reassess symptoms in 3 months, consider in-lab sleep study if symptoms persist. - Continue sleep hygiene: avoid afternoon caffeine, no alcohol before bed, sleep on side, elevate head of bed, avoid screens before bed.  Migraine Continues to experience headaches despite medication. Follow up with Neurology as scheduled.  Return in about 3 months (around 05/29/2024) for sleep reeval.  Candis Dandy, PA-C 02/27/2024

## 2024-02-27 NOTE — Patient Instructions (Addendum)
 Follow up in 3 months to readdress sleep issues.  May return sooner if new or worsening concerns.  Follow sleep hygiene as discussed.

## 2024-03-04 NOTE — Progress Notes (Unsigned)
 SUBJECTIVE: Discussed the use of AI scribe software for clinical note transcription with the patient, who gave verbal consent to proceed.  Chief Complaint: Obesity  Interim History: She is up 9 lbs since last visit.    Adelene is here to discuss her progress with her obesity treatment plan. She is on the Category 2 Plan and states she is following her eating plan approximately 0 % of the time. She states she is not exercising .  Nieshia Larmon is a 38 year old female with obesity who presents for follow-up on her obesity treatment plan.  She has not been adhering to her nutrition plan, with inadequate protein and whole food intake, insufficient water  consumption, skipping meals, and lack of sleep. She feels physically uncomfortable and describes feeling 'miserable'. Despite these challenges, she does not feel mentally affected.  She experiences back pain and has undergone an in-home sleep apnea test, which showed minimal apneic episodes. She has difficulty getting comfortable at night, tossing and turning, and waking up snoring, although her boyfriend does not hear her snore. She feels irritable, moody and irritated towards others, including her boyfriend and family.  She has been taking Topamax  50 mg at bedtime for the past week, which seems to help somewhat with her sleep. She previously tried a higher dose of 100 mg but found it fatigued her the following day. She has not been taking metformin  due to concerns about its gastrointestinal side effects, especially while working.  She mentions a history of irregular menstrual cycles, having experienced double periods in a month. She reports that a transvaginal ultrasound was performed and she found out she had a small hemorrhagic left ovarian cyst; and has follow up concerning this in about 8 weeks.   She has been on Trulicity  4.5 mg weekly and metformin  500 mg twice a day for insulin  resistance with polyphagia. However, she feels that  Trulicity  is not effectively controlling her food cravings and has gained weight, noting an increase of nine pounds recently. She expresses frustration with her weight management efforts, stating that her weight fluctuates despite her dietary habits. OBJECTIVE: Visit Diagnoses: Problem List Items Addressed This Visit     Obesity Start BMI 41.63 Date 02/20/20   Relevant Medications   metFORMIN  (GLUCOPHAGE ) 500 MG tablet   tirzepatide  (ZEPBOUND ) 2.5 MG/0.5ML injection vial   Other hyperlipidemia - Primary   Vitamin D  deficiency   B12 deficiency   Insulin  resistance   Relevant Medications   metFORMIN  (GLUCOPHAGE ) 500 MG tablet   tirzepatide  (ZEPBOUND ) 2.5 MG/0.5ML injection vial   Eating disorder   Other Visit Diagnoses       BMI 40.0-44.9, adult (HCC) Current BMI 40.5       Relevant Medications   metFORMIN  (GLUCOPHAGE ) 500 MG tablet   tirzepatide  (ZEPBOUND ) 2.5 MG/0.5ML injection vial     Obesity  Obesity with recent weight gain of 9 pounds, primarily in adipose tissue. Current treatment with Trulicity  is ineffective. Zepbound  is considered more effective due to its action on two receptors and potential for significant weight loss. Discussed potential side effects including GI issues, nausea, and rare mood changes. Emphasized hydration and protein intake to mitigate side effects and support weight loss. We have reviewed the risks and benefits of using a GLP-1/GIP medication. The patient denies a personal or family history of medullary thyroid  cancer or MENII. The patient denies a history of pancreatitis. The potential risks and benefits of this GLP-1 /GIP were reviewed with the patient, and alternative treatment options  were discussed. All questions were answered, and the patient wishes to move forward with this medication- Zepbound ..  - Discontinued Trulicity . - Initiated Zepbound  at 2.5 mg weekly. - Educated on potential side effects of Zepbound , including GI issues and mood  changes. - Advised on hydration and protein intake to mitigate side effects. - Encouraged breaking meals into smaller, protein-focused portions. Meds ordered this encounter  Medications   metFORMIN  (GLUCOPHAGE ) 500 MG tablet    Sig: TAKE 1 TABLETS BY MOUTH TWICE DAILY WITH MEALS    Dispense:  60 tablet    Refill:  0   tirzepatide  (ZEPBOUND ) 2.5 MG/0.5ML injection vial    Sig: Inject 2.5 mg into the skin once a week.    Dispense:  2 mL    Refill:  1    Insulin  resistance with polyphagia Insulin  resistance with polyphagia which is poorly controlled on Trulicity  and metformin . Current treatment with metformin  was paused due to concerns about GI side effects. Discussed resuming metformin  after assessing bowel response to Zepbound  and magnesium supplementation. Lab Results  Component Value Date   HGBA1C 5.0 12/12/2023   HGBA1C 5.0 08/15/2023   HGBA1C 5.3 01/31/2023   Lab Results  Component Value Date   LDLCALC 115 (H) 12/12/2023   CREATININE 0.67 12/12/2023   INSULIN   Date Value Ref Range Status  12/12/2023 7.6 2.6 - 24.9 uIU/mL Final  ] - Refilled metformin  prescription. - Advised to start metformin  500 mg daily after assessing bowel response to magnesium and Zepbound  and increase to 500 mg BID to tolerance.  Continue working on nutrition plan to decrease simple carbohydrates, increase lean proteins and exercise to promote weight loss, improve glycemic control and prevent progression to Type 2 diabetes.    Chronic constipation Recent exacerbation, with periods of up to a week without bowel movement. Magnesium supplementation is recommended to alleviate constipation and improve sleep quality. - Initiated Calm magnesium supplementation, starting with one teaspoon nightly. - Advised on potential for magnesium to cause diarrhea and to adjust dose accordingly especially as starts treatment with Zepbound  .  Chronic back pain Contributing to sleep disturbance. Magnesium may help relax  muscles and alleviate pain. - Recommended magnesium supplementation to help relax muscles and alleviate back pain.  Sleep disturbance (insomnia) Chronic sleep disturbance with difficulty maintaining sleep. Current use of topiramate  50 mg at bedtime provides some relief.as well as helping with migraine prophylaxis.  Magnesium supplementation is recommended to improve sleep quality. - Continue topiramate  50 mg at bedtime. - Initiated Calm magnesium supplementation to improve sleep quality.  Migraine HA Taking topiramate  with some improvement in HA frequency.  Plan: Continue topiramate  50 mg nightly for migraine prevention- no refill needed currently.   Vitals Temp: 97.9 F (36.6 C) BP: 129/75 Pulse Rate: 79 SpO2: 100 %   Anthropometric Measurements Height: 5' 3 (1.6 m) Weight: 228 lb (103.4 kg) BMI (Calculated): 40.4 Weight at Last Visit: 219 lb Weight Lost Since Last Visit: 0 Weight Gained Since Last Visit: 9 lb Starting Weight: 235 lb Total Weight Loss (lbs): 7 lb (3.175 kg) Peak Weight: 256 lb   Body Composition  Body Fat %: 46.2 % Fat Mass (lbs): 105.6 lbs Muscle Mass (lbs): 116.8 lbs Total Body Water  (lbs): 82.6 lbs Visceral Fat Rating : 12   Other Clinical Data Fasting: No Labs: No Today's Visit #: 57 Starting Date: 02/19/21     ASSESSMENT AND PLAN:  Diet: Deeanne is currently in the action stage of change. As such, her goal is to  continue with weight loss efforts. She has agreed to Category 2 Plan.  Exercise: Tarryn has been instructed to work up to a goal of 150 minutes of combined cardio and strengthening exercise per week for weight loss and overall health benefits.   Behavior Modification:  We discussed the following Behavioral Modification Strategies today: increasing lean protein intake, decreasing simple carbohydrates, increasing vegetables, increase H2O intake, increase high fiber foods, no skipping meals, meal planning and cooking  strategies, better snacking choices, emotional eating strategies , avoiding temptations, and planning for success. We discussed various medication options to help Diahann with her weight loss efforts and we both agreed to [facility] Zepbound  2.5 mg weekly  for medical weight loss and discontinue Trulicity .  Return in about 4 weeks (around 04/02/2024).SABRA She was informed of the importance of frequent follow up visits to maximize her success with intensive lifestyle modifications for her multiple health conditions.  Attestation Statements:   Reviewed by clinician on day of visit: allergies, medications, problem list, medical history, surgical history, family history, social history, and previous encounter notes.   Time spent on visit including pre-visit chart review and post-visit care and charting was 45 minutes.    Yamin Swingler, PA-C

## 2024-03-05 ENCOUNTER — Encounter (INDEPENDENT_AMBULATORY_CARE_PROVIDER_SITE_OTHER): Payer: Self-pay | Admitting: Physician Assistant

## 2024-03-05 ENCOUNTER — Ambulatory Visit (INDEPENDENT_AMBULATORY_CARE_PROVIDER_SITE_OTHER): Payer: Self-pay | Admitting: Physician Assistant

## 2024-03-05 VITALS — BP 129/75 | HR 79 | Temp 97.9°F | Ht 63.0 in | Wt 228.0 lb

## 2024-03-05 DIAGNOSIS — R632 Polyphagia: Secondary | ICD-10-CM

## 2024-03-05 DIAGNOSIS — K5909 Other constipation: Secondary | ICD-10-CM

## 2024-03-05 DIAGNOSIS — G479 Sleep disorder, unspecified: Secondary | ICD-10-CM | POA: Diagnosis not present

## 2024-03-05 DIAGNOSIS — E669 Obesity, unspecified: Secondary | ICD-10-CM

## 2024-03-05 DIAGNOSIS — E7849 Other hyperlipidemia: Secondary | ICD-10-CM | POA: Diagnosis not present

## 2024-03-05 DIAGNOSIS — E88819 Insulin resistance, unspecified: Secondary | ICD-10-CM | POA: Diagnosis not present

## 2024-03-05 DIAGNOSIS — Z6841 Body Mass Index (BMI) 40.0 and over, adult: Secondary | ICD-10-CM | POA: Diagnosis not present

## 2024-03-05 DIAGNOSIS — E559 Vitamin D deficiency, unspecified: Secondary | ICD-10-CM

## 2024-03-05 DIAGNOSIS — G43909 Migraine, unspecified, not intractable, without status migrainosus: Secondary | ICD-10-CM

## 2024-03-05 DIAGNOSIS — E538 Deficiency of other specified B group vitamins: Secondary | ICD-10-CM

## 2024-03-05 DIAGNOSIS — F5089 Other specified eating disorder: Secondary | ICD-10-CM

## 2024-03-05 DIAGNOSIS — G8929 Other chronic pain: Secondary | ICD-10-CM

## 2024-03-05 DIAGNOSIS — G4700 Insomnia, unspecified: Secondary | ICD-10-CM | POA: Diagnosis not present

## 2024-03-05 DIAGNOSIS — M549 Dorsalgia, unspecified: Secondary | ICD-10-CM | POA: Diagnosis not present

## 2024-03-05 MED ORDER — METFORMIN HCL 500 MG PO TABS: ORAL_TABLET | ORAL | 0 refills | Status: DC

## 2024-03-05 MED ORDER — TIRZEPATIDE-WEIGHT MANAGEMENT 2.5 MG/0.5ML ~~LOC~~ SOLN: 2.5000 mg | SUBCUTANEOUS | 1 refills | Status: DC

## 2024-03-26 ENCOUNTER — Ambulatory Visit (HOSPITAL_BASED_OUTPATIENT_CLINIC_OR_DEPARTMENT_OTHER)

## 2024-03-31 ENCOUNTER — Other Ambulatory Visit (INDEPENDENT_AMBULATORY_CARE_PROVIDER_SITE_OTHER): Payer: Self-pay | Admitting: Physician Assistant

## 2024-03-31 DIAGNOSIS — E88819 Insulin resistance, unspecified: Secondary | ICD-10-CM

## 2024-04-08 NOTE — Progress Notes (Unsigned)
 SUBJECTIVE: Discussed the use of AI scribe software for clinical note transcription with the patient, who gave verbal consent to proceed.  Chief Complaint: Obesity  Interim History: She is up 2 lbs since last visit.  Down 33 lbs from peak weight TBW loss of 12.9% from peak weight Down 12 lbs since starting HWW TBW loss of  5.1%  Brittney Farmer is here to discuss her progress with her obesity treatment plan. She is on the Category 2 Plan and states she is following her eating plan approximately 50 % of the time. She states she is not exercising.  Brittney Farmer is a 38 year old female with obesity, insulin resistance, and hyperlipidemia who presents for a follow-up on her obesity treatment plan.  She has been managing her obesity with Trulicity 1.5 mg weekly for insulin resistance. She experiences some appetite suppression/decreased cravings and continues metformin 500 mg twice daily, though she occasionally misses doses due to her busy schedule. Taking metformin with food improves her tolerance. No nausea, vomiting, constipation, diarrhea, difficulty swallowing, lump in the throat, vision changes, or mood changes since starting Trulicity.  She is on vitamin D supplementation and requires a refill. She takes bupropion (Wellbutrin) but does not need a refill. Topiramate (Topamax) is used for migraines as well as emotional eating/cravings,  but is not taken regularly at night. She has not been taking Lexapro and reports sleep issues, waking up with racing thoughts, and poorly managed anxiety and depression. She plans to see her PCP in follow up for her anxiety and depression management.   She experiences low energy levels and has not been exercising regularly due to weather conditions. Previously, she walked two to three miles during lunch breaks, which she found beneficial. She acknowledges not drinking enough water and aims to increase her intake to at least 80 ounces daily.  She is planning a trip  to the beach in June and her son's sixteenth birthday party in May.   Fasting labs obtained today The patient was informed we would discuss the lab results at the next visit unless there is a critical issue that needs to be addressed sooner. The patient agreed to keep the next visit at the agreed upon time to discuss these results.   OBJECTIVE: Visit Diagnoses: Problem List Items Addressed This Visit     Obesity Start BMI 41.63 Date 02/20/20   Relevant Medications   Dulaglutide (TRULICITY) 3 MG/0.5ML SOAJ   Other hyperlipidemia   Relevant Orders   Lipid Panel With LDL/HDL Ratio   Vitamin D deficiency   Relevant Medications   Vitamin D, Ergocalciferol, (DRISDOL) 1.25 MG (50000 UNIT) CAPS capsule   Other Relevant Orders   VITAMIN D 25 Hydroxy (Vit-D Deficiency, Fractures)   B12 deficiency   Relevant Orders   Vitamin B12   CBC with Differential/Platelet   Insulin resistance - Primary   Relevant Medications   Dulaglutide (TRULICITY) 3 MG/0.5ML SOAJ   Other Relevant Orders   CMP14+EGFR   Hemoglobin A1c   Insulin, random   TSH   Eating disorder   Other Visit Diagnoses       BMI 39.0-39.9,adult Current BMI 39.6         Obesity She is being treated for obesity with insulin resistance, vitamin D deficiency, hyperlipidemia, and emotional eating. Currently on Trulicity 1.5 mg weekly and metformin 500 mg twice daily. Reports no significant side effects from Trulicity but feels it is not working effectively. Experiences some appetite suppression and is not hungry at  night. Not consistently taking metformin twice daily due to a busy schedule and gastrointestinal discomfort when taken without food. Importance of taking metformin with food was discussed. Encouraged to increase physical activity and water intake. Benefits of high-intensity interval training (HIIT) and increased water intake were discussed. - Increase Trulicity to 3 mg weekly, with the possibility of further increasing to 4.5  mg if needed. - Continue metformin 500 mg twice daily with food. - Encourage consistent medication adherence. - Encourage regular physical activity, including walking and HIIT exercises. - Increase water intake to at least 80 ounces daily. Meds ordered this encounter  Medications   Dulaglutide (TRULICITY) 3 MG/0.5ML SOAJ    Sig: Inject 3 mg as directed once a week.    Dispense:  2 mL    Refill:  0   Vitamin D, Ergocalciferol, (DRISDOL) 1.25 MG (50000 UNIT) CAPS capsule    Sig: Take 1 capsule (50,000 Units total) by mouth every 7 (seven) days.    Dispense:  4 capsule    Refill:  0    Vitamin D Deficiency Requires ongoing vitamin D supplementation- Ergocalciferol 50,000 units once weekly. Reports no N/V or muscle weakness with Ergocalciferol. Reports needing a refill for her vitamin D prescription. Last vitamin D Lab Results  Component Value Date   VD25OH 45.4 01/31/2023  Low vitamin D levels can be associated with adiposity and may result in leptin resistance and weight gain. Also associated with fatigue.  Currently on vitamin D supplementation without any adverse effects such as nausea, vomiting or muscle weakness.  Recheck vitamin D level today.  - Refill vitamin D prescription.  Hyperlipidemia LDL is not at goal. Medication(s): Not on statin Cardiovascular risk factors: dyslipidemia, obesity (BMI >= 30 kg/m2), and sedentary lifestyle  Lab Results  Component Value Date   CHOL 224 (H) 01/31/2023   HDL 46 01/31/2023   LDLCALC 165 (H) 01/31/2023   TRIG 72 01/31/2023   CHOLHDL 3.4 07/12/2022   CHOLHDL 4.8 (H) 05/10/2022   CHOLHDL 3.8 11/11/2021   Lab Results  Component Value Date   ALT 11 01/31/2023   AST 11 01/31/2023   ALKPHOS 111 01/31/2023   BILITOT 0.4 01/31/2023   The ASCVD Risk score (Arnett DK, et al., 2019) failed to calculate for the following reasons:   The 2019 ASCVD risk score is only valid for ages 98 to 90  Plan: Continue to work on Engineer, technical sales  -decreasing simple carbohydrates, increasing lean proteins, decreasing saturated fats and cholesterol , avoiding trans fats and exercise as able to promote weight loss, improve lipids and decrease cardiovascular risks. Recheck fasting lipids today  History of low B 12 Endorses fatigue.  Lab Results  Component Value Date   VITAMINB12 801 01/31/2023   Plan: Recheck CBC and B 12 in addition to other labs today and supplement if indicated.    Other depression/emotional eating Maylani has had issues with stress/emotional eating. Currently this is moderately controlled. Overall mood is stable. Medication(s): Bupropion SR 150 mg daily in am, Topiramate 50 -100 mg daily, and Other: Stopped Lexapro  She is experiencing difficulty sleeping and plans to follow up with PCP.  Plan:  Follow up with PCP concerning medication for depression and anxiety.   Migraine Taking topiramate (Topamax) for migraine management and reports it is effective in controlling her migraines. - Continue topiramate for migraine management.  Anxiety and Depression Reports not taking Lexapro and experiencing anxiety, depression, and insomnia. Plans to see her provider, Jannifer Rodney, FNP, for further  management. Has not taken Restoril due to concerns about side effects experienced with previous medications. - Schedule an appointment with PCP- Tommas Fragmin, NP Continue usual medications .   Follow-up Due for routine blood work to monitor her health status, including the effects of Trulicity on her A1c and insulin levels. - Order blood tests: B12, CBC, chemistry panel, liver and kidney function tests, A1c, insulin level, lipids, and TSH. - Schedule follow-up appointments on May 13th and June 3rd. - Discuss lab results with her before the next visit if any abnormalities are found.  Vitals Temp: 97.7 F (36.5 C) BP: 104/70 Pulse Rate: 78 SpO2: 99 %   Anthropometric Measurements Height: 5\' 3"  (1.6 m) Weight: 223 lb  (101.2 kg) BMI (Calculated): 39.51 Weight at Last Visit: 221 lb Weight Lost Since Last Visit: 0 Weight Gained Since Last Visit: 2 lb Starting Weight: 235 lb Total Weight Loss (lbs): 12 lb (5.443 kg) Peak Weight: 256 lb   Body Composition  Body Fat %: 44.5 % Fat Mass (lbs): 99.6 lbs Muscle Mass (lbs): 117.8 lbs Total Body Water (lbs): 82 lbs Visceral Fat Rating : 11   Other Clinical Data Fasting: yes Labs: no Today's Visit #: 39 Starting Date: 02/20/20     ASSESSMENT AND PLAN:  Diet: Devika is currently in the action stage of change. As such, her goal is to continue with weight loss efforts. She has agreed to Category 2 Plan.  Exercise: Korrina has been instructed to work up to a goal of 150 minutes of combined cardio and strengthening exercise per week for weight loss and overall health benefits.   Behavior Modification:  We discussed the following Behavioral Modification Strategies today: increasing lean protein intake, decreasing simple carbohydrates, increasing vegetables, increase H2O intake, increase high fiber foods, meal planning and cooking strategies, emotional eating strategies , avoiding temptations, and planning for success. We discussed various medication options to help Jovita with her weight loss efforts and we both agreed to increase trulicity to 3 mg weekly for insulin resistance and continue to work on nutritional and behavioral strategies to promote weight loss.  .  Return in about 4 weeks (around 09/12/2023).Aaron Aas She was informed of the importance of frequent follow up visits to maximize her success with intensive lifestyle modifications for her multiple health conditions.  Attestation Statements:   Reviewed by clinician on day of visit: allergies, medications, problem list, medical history, surgical history, family history, social history, and previous encounter notes.   Time spent on visit including pre-visit chart review and post-visit care and  charting was 35 minutes.    Chanc Kervin, PA-C

## 2024-04-09 ENCOUNTER — Ambulatory Visit (INDEPENDENT_AMBULATORY_CARE_PROVIDER_SITE_OTHER): Admitting: Physician Assistant

## 2024-04-09 ENCOUNTER — Encounter (INDEPENDENT_AMBULATORY_CARE_PROVIDER_SITE_OTHER): Payer: Self-pay | Admitting: Physician Assistant

## 2024-04-09 VITALS — BP 128/76 | HR 75 | Temp 98.1°F | Ht 63.0 in | Wt 232.0 lb

## 2024-04-09 DIAGNOSIS — E88819 Insulin resistance, unspecified: Secondary | ICD-10-CM

## 2024-04-09 DIAGNOSIS — F5089 Other specified eating disorder: Secondary | ICD-10-CM

## 2024-04-09 DIAGNOSIS — E669 Obesity, unspecified: Secondary | ICD-10-CM | POA: Diagnosis not present

## 2024-04-09 DIAGNOSIS — E559 Vitamin D deficiency, unspecified: Secondary | ICD-10-CM

## 2024-04-09 DIAGNOSIS — E7849 Other hyperlipidemia: Secondary | ICD-10-CM | POA: Diagnosis not present

## 2024-04-09 DIAGNOSIS — F509 Eating disorder, unspecified: Secondary | ICD-10-CM | POA: Diagnosis not present

## 2024-04-09 DIAGNOSIS — Z6841 Body Mass Index (BMI) 40.0 and over, adult: Secondary | ICD-10-CM

## 2024-04-09 MED ORDER — VITAMIN D (ERGOCALCIFEROL) 1.25 MG (50000 UNIT) PO CAPS: 50000.0000 [IU] | ORAL_CAPSULE | ORAL | 0 refills | Status: DC

## 2024-04-09 MED ORDER — TIRZEPATIDE-WEIGHT MANAGEMENT 10 MG/0.5ML ~~LOC~~ SOLN: 10.0000 mg | SUBCUTANEOUS | 0 refills | Status: DC

## 2024-04-11 ENCOUNTER — Other Ambulatory Visit (INDEPENDENT_AMBULATORY_CARE_PROVIDER_SITE_OTHER): Payer: Self-pay

## 2024-04-11 MED ORDER — ZEPBOUND 5 MG/0.5ML ~~LOC~~ SOLN: 5.0000 mg | SUBCUTANEOUS | 0 refills | Status: DC

## 2024-04-12 NOTE — Progress Notes (Deleted)
 NEUROLOGY FOLLOW UP OFFICE NOTE  Brittney Farmer [id number]  Subjective:  Brittney Farmer is a 38 y.o. year old right-handed female with a medical history of vit D deficiency, obesity, and tachycardia who we last saw on 01/04/24 for headaches.  To briefly review: 01/04/24: She has had headaches for many years that come and go. Over the last few months, the headaches have been very strong. She thought it was due to her eyes because she sits in front of her computer.   The pain can be anywhere in the head (whole head, back of head, front of head, behind the eye). It can be achy pain or stabbing and pulsating and throbbing in character. The pain can be as bad as 8-9/10. Her headaches last a few hours to all day. Of late, she is having a headache about every other day. Prior to recently, she was having 2-3 headaches per week. She can wake up at night with a headache and more likely to have a headache early in the day. She has associated photophobia, phonophobia, and nausea. When she has a headache, she will sometimes need to lay in a cold dark room. She will take ibuprofen  sometimes multiple times per day when she has a headache. She has never been prescribed a headache rescue medication. She has zofran  tablet as needed for nausea. She also endorses neck pain.   She will sometimes sees spots in her eyes but not necessarily any aura to warn her of headache.   She is currently on topamax  100 mg at bedtime (increased from 50 mg on 11/14/23). She has been on topamax  for at least a year. She is being given this for also for weight loss.   She mentions that she will sometimes snore. She does not feel well rested when she wakes and is tired throughout the night.  Of note, she started Trulicity  a few months ago as well. She is prescribed pindolol  for tachycardia but not taking it. This is prescribed by a PCP (originally by cardiologist).   Smoker: no OCP use: no Caffiene use: 1-2 diet sodas per day EtOH  use: none Restrictive diet: no Family history of neurologic disease including headaches: family history of brain tumors (grandfather), brother with MS, grandmother with strokes  Most recent Assessment and Plan (01/04/24): Brittney Farmer is a 38 y.o. female who presents for evaluation of headaches. She has a relevant medical history of vit D deficiency, obesity, and tachycardia. Her neurological examination is normal today. Available diagnostic data is significant for normal B12 and TSH. Patient's symptoms are most consistent with migraine without aura with possible contributions from cervicalgia and possible undiagnosed sleep apnea. She has never been on a migraine rescue medication, so it taking a lot of ibuprofen  to help with headaches, which is likely contributing to headache frequency (medication overuse). Finally, GLP1 inhibitor was also started around when her headaches worsened, so could be contributing though not the only cause. She is having about 15 headaches per month currently. She is on topamax  100 mg daily for headaches and weight loss but not noticing it helping with either. I will add propranolol  given history of tachycardia to see if this helps with migraine prevention.   PLAN: -Sleep medicine referral for possible OSA -PT for cervicalgia [facility] [place])   -For migraines: Migraine prevention:  Continue Topamax  100 mg daily. Stop pindolol . Start propranolol  20 mg twice daily. Migraine rescue:  Sumatriptan  100 mg as needed at headache onset, can repeat after 2 hours if needed  Limit use of pain relievers to no more than 2 days out of week to prevent risk of rebound or medication-overuse headache. Keep headache diary  Since their last visit: *** PT?  Patient saw sleep medicine on 01/17/24 and home sleep study showed no significant sleep apnea. Possible in-lab study was discussed, but patient wanted to hold off for now. The plan was to reassess in 3 months.  Per sleep notes, patient was  still having migraines despite medication***  Headaches? Current medications? Side effects?   MEDICATIONS:  Outpatient Encounter Medications as of 04/24/2024  Medication Sig Note   dicyclomine  (BENTYL ) 10 MG capsule TAKE 1 CAPSULE BY MOUTH 4 TIMES DAILY BEFORE MEALS AND AT BEDTIME FOR ABDOMINAL PAIN (Patient taking differently: Take 10 mg by mouth as needed (stomach cramps).)    fluticasone  (FLONASE ) 50 MCG/ACT nasal spray Place 2 sprays into both nostrils daily.    ibuprofen  (ADVIL ) 800 MG tablet Take 1 tablet (800 mg total) by mouth every 8 (eight) hours as needed.    loratadine  (CLARITIN ) 10 MG tablet Take 1 tablet (10 mg total) by mouth daily.    metFORMIN  (GLUCOPHAGE ) 500 MG tablet TAKE 1 TABLETS BY MOUTH TWICE DAILY WITH MEALS 04/09/2024: Ineffective and stopped taking   omeprazole  (PRILOSEC) 40 MG capsule Take 1 capsule (40 mg total) by mouth daily.    ondansetron  (ZOFRAN ) 4 MG tablet Take 1 tablet (4 mg total) by mouth every 8 (eight) hours as needed for nausea or vomiting. TAKE 1 TABLET BY MOUTH EVERY 8 HOURS AS NEEDED FOR NAUSEA AND VOMITING    propranolol  (INDERAL ) 20 MG tablet Take 1 tablet (20 mg total) by mouth 2 (two) times daily.    SUMAtriptan  (IMITREX ) 100 MG tablet Take 1 tablet (100 mg total) by mouth once as needed for up to 1 dose. May repeat in 2 hours if headache persists or recurs.    tirzepatide  (ZEPBOUND ) 5 MG/0.5ML injection vial Inject 5 mg into the skin once a week.    topiramate  (TOPAMAX ) 50 MG tablet Take 50 mg at bedtime and increase to 100 mg nightly if tolerated. 04/09/2024: Not taking and HA controlled with propranolol    Vitamin D , Ergocalciferol , (DRISDOL ) 1.25 MG (50000 UNIT) CAPS capsule Take 1 capsule (50,000 Units total) by mouth every 14 (fourteen) days.    No facility-administered encounter medications on file as of 04/24/2024.    PAST MEDICAL HISTORY: Past Medical History:  Diagnosis Date   ADD (attention deficit disorder)    ADHD    Anemia     Anxiety    Back pain    Constipation    Depression    GERD (gastroesophageal reflux disease)    IBS (irritable bowel syndrome)    IBS (irritable bowel syndrome)    Palpitations    SOB (shortness of breath) on exertion    Spine curvature, acquired    Stomach ulcer     PAST SURGICAL HISTORY: Past Surgical History:  Procedure Laterality Date   BIOPSY  07/27/2020   Procedure: BIOPSY;  Surgeon: Cindie Carlin POUR, DO;  Location: [facility];  Service: Endoscopy;;   COLONOSCOPY WITH PROPOFOL  N/A 07/27/2020   Procedure: COLONOSCOPY WITH PROPOFOL ;  Surgeon: Cindie Carlin POUR, DO;  Location: [facility];  Service: Endoscopy;  Laterality: N/A;  PM   ESOPHAGOGASTRODUODENOSCOPY (EGD) WITH PROPOFOL  N/A 07/27/2020   Procedure: ESOPHAGOGASTRODUODENOSCOPY (EGD) WITH PROPOFOL ;  Surgeon: Cindie Carlin POUR, DO;  Location: [facility];  Service: Endoscopy;  Laterality: N/A;   HAND SURGERY  Left 09/08/2016   [facility]   TUBAL LIGATION  03/13/2019   UMBILICAL HERNIA REPAIR N/A 10/20/2021   Procedure: HERNIA REPAIR UMBILICAL ADULT W/ MESH;  Surgeon: Evonnie Dorothyann LABOR, DO;  Location: [facility];  Service: General;  Laterality: N/A;   VAGINAL DELIVERY  03/13/2019   WISDOM TOOTH EXTRACTION      ALLERGIES: Allergies[1]  FAMILY HISTORY: Family History  Problem Relation Age of Onset   Diabetes Mother    Hyperlipidemia Mother    Hypertension Mother    Heart disease Mother    Depression Mother    Anxiety disorder Mother    Alcoholism Mother    Hyperlipidemia Father    Hypertension Father    Alcoholism Father    Celiac disease Neg Hx    Inflammatory bowel disease Neg Hx    Colon cancer Neg Hx     SOCIAL HISTORY: Social History[2] Social History   Social History Narrative   Are you right handed or left handed? Right   Are you currently employed ?    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   Who lives with you? family   What type of home do you  live in: 1 story or 2 story? one    Caffience 1 or 2 cans a day      Objective:  Vital Signs:  There were no vitals taken for this visit.  ***  Labs and Imaging review: New results: Home sleep study (02/09/24):   Previously reviewed results: 12/12/23: Vit D wnl Lipid panel: tChol 176, LDL 115, TG 73 HbA1c: 5.0 CMP unremarkable B12: 623   TSH (08/15/23) wnl Folate (01/31/23) wnl Ferritin (01/31/23): 97   Imaging/Procedures: Cervical spine xray (06/28/23): FINDINGS: There is no acute fracture or subluxation of the cervical spine. Slight straightening of lower cervical spine may be related to muscle spasm. The vertebral body heights and disc spaces are maintained. The visualized posterior elements and odontoid are intact. There is anatomic alignment of the lateral masses of C1 and C2. The neural foramina appear patent. The soft tissues are unremarkable.   IMPRESSION: No acute/traumatic cervical spine pathology.  Assessment/Plan:  This is Namita Yearwood, a 39 y.o. female with: ***   Plan: ***  Return to clinic in ***  Total time spent reviewing records, interview, history/exam, documentation, and coordination of care on day of encounter:  *** min  Venetia Potters, MD    [1]  Allergies Allergen Reactions   Latex     Band aids - causes skin irritation    [2]  Social History Tobacco Use   Smoking status: Never   Smokeless tobacco: Never  Vaping Use   Vaping status: Never Used  Substance Use Topics   Alcohol use: No   Drug use: No

## 2024-04-23 ENCOUNTER — Ambulatory Visit (INDEPENDENT_AMBULATORY_CARE_PROVIDER_SITE_OTHER): Payer: Self-pay | Admitting: Family

## 2024-04-23 ENCOUNTER — Encounter: Payer: Self-pay | Admitting: Family

## 2024-04-23 VITALS — BP 113/77 | HR 76 | Temp 98.0°F | Ht 63.0 in | Wt 237.0 lb

## 2024-04-23 DIAGNOSIS — E538 Deficiency of other specified B group vitamins: Secondary | ICD-10-CM

## 2024-04-23 DIAGNOSIS — E7849 Other hyperlipidemia: Secondary | ICD-10-CM

## 2024-04-23 DIAGNOSIS — Z6841 Body Mass Index (BMI) 40.0 and over, adult: Secondary | ICD-10-CM

## 2024-04-23 DIAGNOSIS — F331 Major depressive disorder, recurrent, moderate: Secondary | ICD-10-CM

## 2024-04-23 DIAGNOSIS — R635 Abnormal weight gain: Secondary | ICD-10-CM

## 2024-04-23 DIAGNOSIS — G43109 Migraine with aura, not intractable, without status migrainosus: Secondary | ICD-10-CM | POA: Diagnosis not present

## 2024-04-23 DIAGNOSIS — K219 Gastro-esophageal reflux disease without esophagitis: Secondary | ICD-10-CM | POA: Diagnosis not present

## 2024-04-23 DIAGNOSIS — L659 Nonscarring hair loss, unspecified: Secondary | ICD-10-CM

## 2024-04-23 DIAGNOSIS — D649 Anemia, unspecified: Secondary | ICD-10-CM

## 2024-04-23 DIAGNOSIS — E559 Vitamin D deficiency, unspecified: Secondary | ICD-10-CM

## 2024-04-23 DIAGNOSIS — N921 Excessive and frequent menstruation with irregular cycle: Secondary | ICD-10-CM | POA: Diagnosis not present

## 2024-04-23 DIAGNOSIS — F411 Generalized anxiety disorder: Secondary | ICD-10-CM | POA: Diagnosis not present

## 2024-04-23 DIAGNOSIS — R5383 Other fatigue: Secondary | ICD-10-CM

## 2024-04-23 MED ORDER — IBUPROFEN 800 MG PO TABS: 800.0000 mg | ORAL_TABLET | Freq: Three times a day (TID) | ORAL | 0 refills | Status: DC | PRN

## 2024-04-23 NOTE — Patient Instructions (Signed)
 Fatigue If you have fatigue, you feel tired all the time and have a lack of energy or a lack of motivation. Fatigue may make it difficult to start or complete tasks because of exhaustion. Occasional or mild fatigue is often a normal response to activity or life. However, long-term (chronic) or extreme fatigue may be a symptom of a medical condition such as: Depression. Not having enough red blood cells or hemoglobin in the blood (anemia). A problem with a small gland located in the lower front part of the neck (thyroid disorder). Rheumatologic conditions. These are problems related to the body's defense system (immune system). Infections, especially certain viral infections. Fatigue can also lead to negative health outcomes over time. Follow these instructions at home: Medicines Take over-the-counter and prescription medicines only as told by your health care provider. Take a multivitamin if told by your health care provider. Do not use herbal or dietary supplements unless they are approved by your health care provider. Eating and drinking  Avoid heavy meals in the evening. Eat a well-balanced diet, which includes lean proteins, whole grains, plenty of fruits and vegetables, and low-fat dairy products. Avoid eating or drinking too many products with caffeine in them. Avoid alcohol. Drink enough fluid to keep your urine pale yellow. Activity  Exercise regularly, as told by your health care provider. Use or practice techniques to help you relax, such as yoga, tai chi, meditation, or massage therapy. Lifestyle Change situations that cause you stress. Try to keep your work and personal schedules in balance. Do not use recreational or illegal drugs. General instructions Monitor your fatigue for any changes. Go to bed and get up at the same time every day. Avoid fatigue by pacing yourself during the day and getting enough sleep at night. Maintain a healthy weight. Contact a health care  provider if: Your fatigue does not get better. You have a fever. You suddenly lose or gain weight. You have headaches. You have trouble falling asleep or sleeping through the night. You feel angry, guilty, anxious, or sad. You have swelling in your legs or another part of your body. Get help right away if: You feel confused, feel like you might faint, or faint. Your vision is blurry or you have a severe headache. You have severe pain in your abdomen, your back, or the area between your waist and hips (pelvis). You have chest pain, shortness of breath, or an irregular or fast heartbeat. You are unable to urinate, or you urinate less than normal. You have abnormal bleeding from the rectum, nose, lungs, nipples, or, if you are female, the vagina. You vomit blood. You have thoughts about hurting yourself or others. These symptoms may be an emergency. Get help right away. Call 911. Do not wait to see if the symptoms will go away. Do not drive yourself to the hospital. Get help right away if you feel like you may hurt yourself or others, or have thoughts about taking your own life. Go to your nearest emergency room or: Call 911. Call the National Suicide Prevention Lifeline at (262)721-8699 or 988. This is open 24 hours a day. Text the Crisis Text Line at 8450584327. Summary If you have fatigue, you feel tired all the time and have a lack of energy or a lack of motivation. Fatigue may make it difficult to start or complete tasks because of exhaustion. Long-term (chronic) or extreme fatigue may be a symptom of a medical condition. Exercise regularly, as told by your health care provider.  Change situations that cause you stress. Try to keep your work and personal schedules in balance. This information is not intended to replace advice given to you by your health care provider. Make sure you discuss any questions you have with your health care provider. Document Revised: 02/08/2021 Document  Reviewed: 02/08/2021 Elsevier Patient Education  2024 [employer].

## 2024-04-23 NOTE — Progress Notes (Signed)
 "  Subjective:    Patient ID: Brittney Farmer, female    DOB: 10-11-1985, 38 y.o.   MRN: [id number]  Chief Complaint  Patient presents with   Medical Management of Chronic Issues    Patient complains of weight gain and hair loss and irritable and 2 periods a month. Has been going on since April-may. Wants it figured out maybe some labs and hormone panel.    Pt presents to the office today for chronic follow up.    She is going healthy weight loss clinic. She is morbid obese with a BMI of 41 with depression, GAD, and hyperlipidemia. She is currently taking Zepbound  5 mg.    She is followed by Cardiologists for palpitations as needed.   She is followed by Neurologists for migraines and was started on propranolol  for this and helps with her palpitations.  She has a sleep study ordered.   She reports having two menstrual cycles the a month that started 5 months ago. She reports 4-5 days of heavy bleeding with clots.  Will use approx 5-6 tampons a day. She had a transvaginal US  that showed a ovarian cyst.   She also reports hair loss, acne, irritability, insomnia, and fatigue for months.   Gastroesophageal Reflux She complains of belching and heartburn. This is a chronic problem. The current episode started more than 1 year ago. The problem occurs occasionally. The problem has been waxing and waning. The symptoms are aggravated by certain foods. Risk factors include obesity. She has tried a PPI for the symptoms. The treatment provided moderate relief.  Anxiety Presents for follow-up visit. Symptoms include irritability, nervous/anxious behavior and restlessness. Patient reports no excessive worry. Symptoms occur occasionally. The severity of symptoms is mild.    Depression        This is a chronic problem.  The current episode started more than 1 year ago.   The onset quality is gradual.   The problem occurs intermittently.  Associated symptoms include restlessness.  Associated symptoms include  no helplessness, no hopelessness and not sad.  Past treatments include nothing.  Past medical history includes anxiety.   Anemia Presents for follow-up visit. Symptoms include malaise/fatigue. There has been no leg swelling or light-headedness.      Review of Systems  Constitutional:  Positive for irritability and malaise/fatigue.  Gastrointestinal:  Positive for heartburn.  Neurological:  Negative for light-headedness.  Psychiatric/Behavioral:  The patient is nervous/anxious.   All other systems reviewed and are negative.  Family History  Problem Relation Age of Onset   Diabetes Mother    Hyperlipidemia Mother    Hypertension Mother    Heart disease Mother    Depression Mother    Anxiety disorder Mother    Alcoholism Mother    Hyperlipidemia Father    Hypertension Father    Alcoholism Father    Celiac disease Neg Hx    Inflammatory bowel disease Neg Hx    Colon cancer Neg Hx    Social History   Socioeconomic History   Marital status: Single    Spouse name: Lamar Epley   Number of children: 4   Years of education: Not on file   Highest education level: Not on file  Occupational History   Occupation: Dipl[employer] Services Operational Officer  Tobacco Use   Smoking status: Never   Smokeless tobacco: Never  Vaping Use   Vaping status: Never Used  Substance and Sexual Activity   Alcohol use: No   Drug use: No   Sexual activity: Yes  Other Topics Concern   Not on file  Social History Narrative   Are you right handed or left handed? Right   Are you currently employed ?    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   Who lives with you? family   What type of home do you live in: 1 story or 2 story? one    Caffience 1 or 2 cans a day   Social Drivers of Health   Tobacco Use: Low Risk (04/23/2024)   Patient History    Smoking Tobacco Use: Never    Smokeless Tobacco Use: Never    Passive Exposure: Not on file  Financial Resource Strain: Not on file   Food Insecurity: Not on file  Transportation Needs: Not on file  Physical Activity: Not on file  Stress: Not on file  Social Connections: Not on file  Depression (PHQ2-9): High Risk (04/23/2024)   Depression (PHQ2-9)    PHQ-2 Score: 12  Alcohol Screen: Not on file  Housing: Not on file  Utilities: Not on file  Health Literacy: Not on file       Objective:   Physical Exam Vitals reviewed.  Constitutional:      General: She is not in acute distress.    Appearance: She is well-developed. She is obese.  HENT:     Head: Normocephalic and atraumatic.     Right Ear: Tympanic membrane normal.     Left Ear: Tympanic membrane normal.  Eyes:     Pupils: Pupils are equal, round, and reactive to light.  Neck:     Thyroid : No thyromegaly.  Cardiovascular:     Rate and Rhythm: Normal rate and regular rhythm.     Heart sounds: Normal heart sounds. No murmur heard. Pulmonary:     Effort: Pulmonary effort is normal. No respiratory distress.     Breath sounds: Normal breath sounds. No wheezing.  Abdominal:     General: Bowel sounds are normal. There is no distension.     Palpations: Abdomen is soft.     Tenderness: There is no abdominal tenderness.  Musculoskeletal:        General: No tenderness. Normal range of motion.     Cervical back: Normal range of motion and neck supple.  Skin:    General: Skin is warm and dry.  Neurological:     Mental Status: She is alert and oriented to person, place, and time.     Cranial Nerves: No cranial nerve deficit.     Deep Tendon Reflexes: Reflexes are normal and symmetric.  Psychiatric:        Behavior: Behavior normal.        Thought Content: Thought content normal.        Judgment: Judgment normal.       BP 113/77   Pulse 76   Temp 98 F (36.7 C) (Temporal)   Ht 5' 3 (1.6 m)   Wt 237 lb (107.5 kg)   BMI 41.98 kg/m      Assessment & Plan:  Brittney Farmer comes in today with chief complaint of Medical Management of Chronic  Issues (Patient complains of weight gain and hair loss and irritable and 2 periods a month. Has been going on since April-may. Wants it figured out maybe some labs and hormone panel.)   Diagnosis and orders addressed:  1. Migraine with aura and without status migrainosus, not intractable - ibuprofen  (ADVIL ) 800 MG tablet; Take  1 tablet (800 mg total) by mouth every 8 (eight) hours as needed.  Dispense: 90 tablet; Refill: 0 - CMP14+EGFR  2. Weight gain (Primary) - CMP14+EGFR - Hormone Panel  3. Anemia, unspecified type - CMP14+EGFR - Anemia Profile B - Hormone Panel  4. B12 deficiency - CMP14+EGFR - Anemia Profile B  5. Moderate episode of recurrent major depressive disorder (HCC) - CMP14+EGFR  6. Gastroesophageal reflux disease without esophagitis - CMP14+EGFR  7. GAD (generalized anxiety disorder) - CMP14+EGFR  8. Vitamin D  deficiency - CMP14+EGFR  9. Other hyperlipidemia - CMP14+EGFR  10. Other fatigue - CMP14+EGFR - Hormone Panel - Ambulatory referral to Gynecology  11. Obesity Start BMI 41.63 Date 02/20/20 - CMP14+EGFR  12. Hair loss - CMP14+EGFR - Ambulatory referral to Gynecology  13. Menorrhagia with irregular cycle - CMP14+EGFR - Ambulatory referral to Gynecology    Labs pending  Keep follow up with specialists  Transvaginal US  reviewed, referral to GYN  Continue current medications  Health Maintenance reviewed Diet and exercise encouraged  Follow up plan: 6 months    Bari Learn, FNP    "

## 2024-04-24 ENCOUNTER — Ambulatory Visit: Admitting: Neurology

## 2024-04-26 ENCOUNTER — Ambulatory Visit: Payer: Self-pay | Admitting: Family

## 2024-05-01 NOTE — Progress Notes (Signed)
 "  NEUROLOGY FOLLOW UP OFFICE NOTE  Brittney Farmer [id number]  Subjective:  Brittney Farmer is a 38 y.o. year old right-handed female with a medical history of vit D deficiency, obesity, and tachycardia who we last saw on 01/04/24 for headaches.  To briefly review: 01/04/24: She has had headaches for many years that come and go. Over the last few months, the headaches have been very strong. She thought it was due to her eyes because she sits in front of her computer.   The pain can be anywhere in the head (whole head, back of head, front of head, behind the eye). It can be achy pain or stabbing and pulsating and throbbing in character. The pain can be as bad as 8-9/10. Her headaches last a few hours to all day. Of late, she is having a headache about every other day. Prior to recently, she was having 2-3 headaches per week. She can wake up at night with a headache and more likely to have a headache early in the day. She has associated photophobia, phonophobia, and nausea. When she has a headache, she will sometimes need to lay in a cold dark room. She will take ibuprofen  sometimes multiple times per day when she has a headache. She has never been prescribed a headache rescue medication. She has zofran  tablet as needed for nausea. She also endorses neck pain.   She will sometimes sees spots in her eyes but not necessarily any aura to warn her of headache.   She is currently on topamax  100 mg at bedtime (increased from 50 mg on 11/14/23). She has been on topamax  for at least a year. She is being given this for also for weight loss.   She mentions that she will sometimes snore. She does not feel well rested when she wakes and is tired throughout the night.  Of note, she started Trulicity  a few months ago as well. She is prescribed pindolol  for tachycardia but not taking it. This is prescribed by a PCP (originally by cardiologist).   Smoker: no OCP use: no Caffiene use: 1-2 diet sodas per day EtOH  use: none Restrictive diet: no Family history of neurologic disease including headaches: family history of brain tumors (grandfather), brother with MS, grandmother with strokes  Most recent Assessment and Plan (01/04/24): Brittney Farmer is a 38 y.o. female who presents for evaluation of headaches. She has a relevant medical history of vit D deficiency, obesity, and tachycardia. Her neurological examination is normal today. Available diagnostic data is significant for normal B12 and TSH. Patient's symptoms are most consistent with migraine without aura with possible contributions from cervicalgia and possible undiagnosed sleep apnea. She has never been on a migraine rescue medication, so it taking a lot of ibuprofen  to help with headaches, which is likely contributing to headache frequency (medication overuse). Finally, GLP1 inhibitor was also started around when her headaches worsened, so could be contributing though not the only cause. She is having about 15 headaches per month currently. She is on topamax  100 mg daily for headaches and weight loss but not noticing it helping with either. I will add propranolol  given history of tachycardia to see if this helps with migraine prevention.   PLAN: -Sleep medicine referral for possible OSA -PT for cervicalgia [facility])   -For migraines: Migraine prevention:  Continue Topamax  100 mg daily. Stop pindolol . Start propranolol  20 mg twice daily. Migraine rescue:  Sumatriptan  100 mg as needed at headache onset, can repeat after 2 hours if  needed Limit use of pain relievers to no more than 2 days out of week to prevent risk of rebound or medication-overuse headache. Keep headache diary  Since their last visit: Patient's headaches have improved. Patient is having 2-3 headaches per week. Her headaches are less intense as well.  She has stopped topamax . She did this herself. She started Zepbound  so thought she could stop topamax . She is taking propranolol  but  sometimes forgets to take it at night. Sumatriptan  will usually help with her headaches.  Patient went to PT and liked it. This helped with her neck.  Patient saw sleep medicine on 01/17/24 and home sleep study showed no significant sleep apnea. Possible in-lab study was discussed, but patient wanted to hold off for now. The plan was to reassess in 3 months.   MEDICATIONS:  Outpatient Encounter Medications as of 05/15/2024  Medication Sig Note   dicyclomine  (BENTYL ) 10 MG capsule TAKE 1 CAPSULE BY MOUTH 4 TIMES DAILY BEFORE MEALS AND AT BEDTIME FOR ABDOMINAL PAIN (Patient taking differently: Take 10 mg by mouth as needed (stomach cramps).)    fluticasone  (FLONASE ) 50 MCG/ACT nasal spray Place 2 sprays into both nostrils daily.    ibuprofen  (ADVIL ) 800 MG tablet Take 1 tablet (800 mg total) by mouth every 8 (eight) hours as needed.    loratadine  (CLARITIN ) 10 MG tablet Take 1 tablet (10 mg total) by mouth daily.    omeprazole  (PRILOSEC) 40 MG capsule Take 1 capsule (40 mg total) by mouth daily.    ondansetron  (ZOFRAN ) 4 MG tablet Take 1 tablet (4 mg total) by mouth every 8 (eight) hours as needed for nausea or vomiting. TAKE 1 TABLET BY MOUTH EVERY 8 HOURS AS NEEDED FOR NAUSEA AND VOMITING    propranolol  ER (INDERAL  LA) 60 MG 24 hr capsule Take 1 capsule (60 mg total) by mouth daily.    tirzepatide  7.5 MG/0.5ML injection vial Inject 7.5 mg into the skin once a week.    Vitamin D , Ergocalciferol , (DRISDOL ) 1.25 MG (50000 UNIT) CAPS capsule Take 1 capsule (50,000 Units total) by mouth every 14 (fourteen) days.    [DISCONTINUED] propranolol  (INDERAL ) 20 MG tablet Take 1 tablet (20 mg total) by mouth 2 (two) times daily.    SUMAtriptan  (IMITREX ) 100 MG tablet Take 1 tablet (100 mg total) by mouth once as needed for up to 1 dose. May repeat in 2 hours if headache persists or recurs. (Patient not taking: Reported on 05/14/2024)    topiramate  (TOPAMAX ) 50 MG tablet Take 50 mg at bedtime and increase to 100  mg nightly if tolerated. (Patient not taking: Reported on 05/15/2024) 04/09/2024: Not taking and HA controlled with propranolol    [DISCONTINUED] ondansetron  (ZOFRAN ) 4 MG tablet Take 1 tablet (4 mg total) by mouth every 8 (eight) hours as needed for nausea or vomiting. TAKE 1 TABLET BY MOUTH EVERY 8 HOURS AS NEEDED FOR NAUSEA AND VOMITING    [DISCONTINUED] tirzepatide  (ZEPBOUND ) 5 MG/0.5ML injection vial Inject 5 mg into the skin once a week.    [DISCONTINUED] Vitamin D , Ergocalciferol , (DRISDOL ) 1.25 MG (50000 UNIT) CAPS capsule Take 1 capsule (50,000 Units total) by mouth every 14 (fourteen) days.    No facility-administered encounter medications on file as of 05/15/2024.    PAST MEDICAL HISTORY: Past Medical History:  Diagnosis Date   ADD (attention deficit disorder)    ADHD    Anemia    Anxiety    Back pain    Constipation    Depression  GERD (gastroesophageal reflux disease)    IBS (irritable bowel syndrome)    IBS (irritable bowel syndrome)    Palpitations    SOB (shortness of breath) on exertion    Spine curvature, acquired    Stomach ulcer     PAST SURGICAL HISTORY: Past Surgical History:  Procedure Laterality Date   BIOPSY  07/27/2020   Procedure: BIOPSY;  Surgeon: Cindie Carlin POUR, DO;  Location: [facility];  Service: Endoscopy;;   COLONOSCOPY WITH PROPOFOL  N/A 07/27/2020   Procedure: COLONOSCOPY WITH PROPOFOL ;  Surgeon: Cindie Carlin POUR, DO;  Location: [facility];  Service: Endoscopy;  Laterality: N/A;  PM   ESOPHAGOGASTRODUODENOSCOPY (EGD) WITH PROPOFOL  N/A 07/27/2020   Procedure: ESOPHAGOGASTRODUODENOSCOPY (EGD) WITH PROPOFOL ;  Surgeon: Cindie Carlin POUR, DO;  Location: [facility];  Service: Endoscopy;  Laterality: N/A;   HAND SURGERY Left 09/08/2016   [place]   TUBAL LIGATION  03/13/2019   UMBILICAL HERNIA REPAIR N/A 10/20/2021   Procedure: HERNIA REPAIR UMBILICAL ADULT W/ MESH;  Surgeon: Evonnie Dorothyann LABOR, DO;  Location: [facility];  Service: General;   Laterality: N/A;   VAGINAL DELIVERY  03/13/2019   WISDOM TOOTH EXTRACTION      ALLERGIES: Allergies[1]  FAMILY HISTORY: Family History  Problem Relation Age of Onset   Diabetes Mother    Hyperlipidemia Mother    Hypertension Mother    Heart disease Mother    Depression Mother    Anxiety disorder Mother    Alcoholism Mother    Hyperlipidemia Father    Hypertension Father    Alcoholism Father    Celiac disease Neg Hx    Inflammatory bowel disease Neg Hx    Colon cancer Neg Hx     SOCIAL HISTORY: Social History[2] Social History   Social History Narrative   Are you right handed or left handed? Right   Are you currently employed ?    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   Who lives with you? family   What type of home do you live in: 1 story or 2 story? one    Caffience 1 or 2 cans a day      Objective:  Vital Signs:  BP 133/79   Pulse 76   Wt 231 lb (104.8 kg)   SpO2 98%   BMI 40.92 kg/m   General: No acute distress.  Patient appears well-groomed.   Head:  Normocephalic/atraumatic Eyes:  Fundi examined, disc margins clear, no obvious papilledema Neck: supple, no paraspinal tenderness, improved range of motion Heart:  Regular rate and rhythm Lungs:  Clear to auscultation bilaterally Neurological Exam: alert and oriented.  Speech fluent and not dysarthric, language intact.  CN II-XII intact. Bulk and tone normal, muscle strength 5/5 throughout.  Sensation to light touch intact.  Deep tendon reflexes 2+ throughout, toes downgoing.  Finger to nose testing intact.  Gait normal, Romberg negative.   Labs and Imaging review: New results: Home sleep study (02/09/24):   Previously reviewed results: 12/12/23: Vit D wnl Lipid panel: tChol 176, LDL 115, TG 73 HbA1c: 5.0 CMP unremarkable B12: 623   TSH (08/15/23) wnl Folate (01/31/23) wnl Ferritin (01/31/23): 97   Imaging/Procedures: Cervical spine xray  (06/28/23): FINDINGS: There is no acute fracture or subluxation of the cervical spine. Slight straightening of lower cervical spine may be related to muscle spasm. The vertebral body heights and disc spaces are maintained. The visualized posterior elements and odontoid are  intact. There is anatomic alignment of the lateral masses of C1 and C2. The neural foramina appear patent. The soft tissues are unremarkable.   IMPRESSION: No acute/traumatic cervical spine pathology.  Assessment/Plan:  This is Brittney Farmer, a 38 y.o. female with: Migraine without aura - was on topamax  100 mg for weight loss. Stopped since I last saw her. I started propranolol  20 mg BID on 01/04/24 which has helped a little, but she often forgets to take the night dose. I will switch to ER to help her with this. Headaches do respond to Sumatriptan  most of the time. She is currently having 2-3 headaches per week, so there is much room for improvement. Cervicalgia - improved with PT   Plan: Migraine prevention:  Will change propranolol  to ER 60 mg daily Migraine rescue:  Continue Sumatriptan  100 mg as needed at headache onset, can repeat in 2 hours if needed Limit use of pain relievers to no more than 2 days out of week to prevent risk of rebound or medication-overuse headache. Keep headache diary  -Continue home PT exercises for cervicalgia.  Return to clinic in 6 months  Total time spent reviewing records, interview, history/exam, documentation, and coordination of care on day of encounter:  30 min  Venetia Potters, MD     [1]  Allergies Allergen Reactions   Latex     Band aids - causes skin irritation    [2]  Social History Tobacco Use   Smoking status: Never   Smokeless tobacco: Never  Vaping Use   Vaping status: Never Used  Substance Use Topics   Alcohol use: No   Drug use: No   "

## 2024-05-02 LAB — ANEMIA PROFILE B
Basophils Absolute: 0 x10E3/uL (ref 0.0–0.2)
Basos: 1 %
EOS (ABSOLUTE): 0.2 x10E3/uL (ref 0.0–0.4)
Eos: 2 %
Ferritin: 110 ng/mL (ref 15–150)
Folate: 5.2 ng/mL
Hematocrit: 41.6 % (ref 34.0–46.6)
Hemoglobin: 13.5 g/dL (ref 11.1–15.9)
Immature Grans (Abs): 0 x10E3/uL (ref 0.0–0.1)
Immature Granulocytes: 0 %
Iron Saturation: 19 % (ref 15–55)
Iron: 54 ug/dL (ref 27–159)
Lymphocytes Absolute: 1.8 x10E3/uL (ref 0.7–3.1)
Lymphs: 25 %
MCH: 29.4 pg (ref 26.6–33.0)
MCHC: 32.5 g/dL (ref 31.5–35.7)
MCV: 91 fL (ref 79–97)
Monocytes Absolute: 0.5 x10E3/uL (ref 0.1–0.9)
Monocytes: 6 %
Neutrophils Absolute: 4.8 x10E3/uL (ref 1.4–7.0)
Neutrophils: 66 %
Platelets: 381 x10E3/uL (ref 150–450)
RBC: 4.59 x10E6/uL (ref 3.77–5.28)
RDW: 12.1 % (ref 11.7–15.4)
Retic Ct Pct: 2 % (ref 0.6–2.6)
Total Iron Binding Capacity: 289 ug/dL (ref 250–450)
UIBC: 235 ug/dL (ref 131–425)
Vitamin B-12: 896 pg/mL (ref 232–1245)
WBC: 7.3 x10E3/uL (ref 3.4–10.8)

## 2024-05-02 LAB — CMP14+EGFR
ALT: 15 IU/L (ref 0–32)
AST: 12 IU/L (ref 0–40)
Albumin: 4.4 g/dL (ref 3.9–4.9)
Alkaline Phosphatase: 120 IU/L — ABNORMAL HIGH (ref 41–116)
BUN/Creatinine Ratio: 13 (ref 9–23)
BUN: 8 mg/dL (ref 6–20)
Bilirubin Total: 0.6 mg/dL (ref 0.0–1.2)
CO2: 23 mmol/L (ref 20–29)
Calcium: 9.3 mg/dL (ref 8.7–10.2)
Chloride: 100 mmol/L (ref 96–106)
Creatinine, Ser: 0.64 mg/dL (ref 0.57–1.00)
Globulin, Total: 2.6 g/dL (ref 1.5–4.5)
Glucose: 85 mg/dL (ref 70–99)
Potassium: 4.5 mmol/L (ref 3.5–5.2)
Sodium: 137 mmol/L (ref 134–144)
Total Protein: 7 g/dL (ref 6.0–8.5)
eGFR: 117 mL/min/1.73

## 2024-05-02 LAB — HORMONE PANEL (T4,TSH,FSH,TESTT,SHBG,DHEA,ETC)
DHEA-Sulfate, LCMS: 176 ug/dL
Estradiol, Serum, MS: 84 pg/mL
Estrone Sulfate: 362 ng/dL
Follicle Stimulating Hormone: 7.7 m[IU]/mL
Free T-3: 3 pg/mL
Free Testosterone, Serum: 3.5 pg/mL
Progesterone, Serum: 143 ng/dL
Sex Hormone Binding Globulin: 66 nmol/L
T4: 8.6 ug/dL
TSH: 1.6 uU/mL
Testosterone, Serum (Total): 35 ng/dL
Testosterone-% Free: 1 %
Triiodothyronine (T-3), Serum: 127 ng/dL

## 2024-05-13 NOTE — Progress Notes (Unsigned)
 "  SUBJECTIVE: Discussed the use of AI scribe software for clinical note transcription with the patient, who gave verbal consent to proceed.  Chief Complaint: Obesity  Interim History: Brittney Farmer is down 7 lbs since her last visit.   Down 10 lbs overall TBW loss of 4.3% Brittney Farmer is here to discuss her progress with her obesity treatment plan. Brittney Farmer is on the Category 2 Plan and states Brittney Farmer is following her eating plan approximately 25 % of the time. Brittney Farmer states Brittney Farmer is exercising walking 20-30 minutes 1-2 times per week. Brittney Farmer is a 39 year old female who presents for follow-up of her obesity treatment plan.  Brittney Farmer has been on Brittney Farmer  5 mg once weekly for insulin  resistance and medical weight loss. The medication is starting to help and Brittney Farmer has completed four doses of the 5 mg and experienced some side effects, including nausea, which occurs at night and is managed with Brittney Farmer , and occasional constipation. Brittney Farmer has been using magnesium to help with constipation, with variable results.  Brittney Farmer has a history of hyperlipidemia, migraine headaches, vitamin D  and B12 deficiency, anxiety, depression, insulin  resistance, and gastroesophageal reflux disease. For her migraines, Brittney Farmer takes propranolol  20 mg twice daily, which has been effective for prophylaxis. Brittney Farmer is also on ergocalciferol  50,000 units every 14 days for vitamin D  deficiency. Brittney Farmer reports losing a lot of hair recently, which Brittney Farmer attributes to either stress or a combination of factors, including recent hair treatment.  Brittney Farmer is not currently taking metformin  and has not been on topiramate  for some time. Brittney Farmer uses Brittney Farmer  approximately three times a week to manage nausea. Brittney Farmer has been hydrating more and reducing soft drink intake, using a 40 oz Brittney Farmer cup to drink water  and tries to fill it twice a day. Brittney Farmer takes omeprazole , usually in the morning, to manage gastroesophageal reflux symptoms, and occasionally in the afternoon depending on her diet. Brittney Farmer does not  regularly use ibuprofen  or Brittney Farmer .   Her recent lab work with her PCP,  showed normal electrolytes and kidney function, with some liver function elevation. Her vitamin D  and B12 levels are in a good range, and Brittney Farmer continues to take vitamin D  every other week. Brittney Farmer has not had any recent imaging studies, but past abdominal CT and ultrasound showed no focal abnormalities.  Plan fasting labs in March 2026  OBJECTIVE: Visit Diagnoses: Problem List Items Addressed This Visit     Obesity Start BMI 41.63 Date 02/20/20   Relevant Medications   tirzepatide  7.5 MG/0.5ML injection vial   Migraine   Vitamin D  deficiency   Relevant Medications   Vitamin D , Ergocalciferol , (Brittney Farmer ) 1.25 MG (50000 UNIT) CAPS capsule   B12 deficiency   Insulin  resistance - Primary   Other Visit Diagnoses       Elevated serum alkaline phosphatase level         Nausea       Relevant Medications   ondansetron  (Brittney Farmer ) 4 MG tablet     BMI 39.0-39.9,adult Current BMI 39.9         Obesity with insulin  resistance Currently on Brittney Farmer  5 mg weekly for insulin  resistance and weight loss. Reports slight improvement in weight loss but still having increased hunger and cravings at times. Brittney Farmer did experience nausea and constipation as side effects.  Lost 7 pounds with a reduction in adipose tissue of 5+ lbs.  Discussed increasing the dose to 7.5 mg to decrease appetite and craving to enhance weight loss. Emphasized the importance of hydration to manage  nausea , focusing on adequate protein intake and maintaining muscle mass during weight loss. Discussed potential future options for oral medication and the importance of nutrition and exercise in weight management. - Increased Brittney Farmer  to 7.5 mg weekly. - Encouraged hydration to manage nausea. - Provided higher protein calorie snacks and sources. - Scheduled follow-up appointment on March 10th at 8:30 AM. Meds ordered this encounter  Medications   tirzepatide  7.5 MG/0.5ML  injection vial    Sig: Inject 7.5 mg into the skin once a week.    Dispense:  2 mL    Refill:  1   ondansetron  (Brittney Farmer ) 4 MG tablet    Sig: Take 1 tablet (4 mg total) by mouth every 8 (eight) hours as needed for nausea or vomiting. TAKE 1 TABLET BY MOUTH EVERY 8 HOURS AS NEEDED FOR NAUSEA AND VOMITING    Dispense:  90 tablet    Refill:  2   Vitamin D , Ergocalciferol , (Brittney Farmer ) 1.25 MG (50000 UNIT) CAPS capsule    Sig: Take 1 capsule (50,000 Units total) by mouth every 14 (fourteen) days.    Dispense:  4 capsule    Refill:  0    Insulin  Resistance Last fasting insulin  was 7.6- increased from previous- not at goal. A1c was 5.0- at goal. Polyphagia:Yes- at times when increased stress Medication(s): Brittney Farmer  5.0 mg SQ weekly Denies mass in neck, dysphagia, dyspepsia, persistent hoarseness, abdominal pain, or N/V or diarrhea. Has annual eye exam. Mood is stable.  Constipation as SE Lab Results  Component Value Date   HGBA1C 5.0 12/12/2023   HGBA1C 5.0 08/15/2023   HGBA1C 5.3 01/31/2023   HGBA1C 5.3 05/10/2022   HGBA1C 5.2 11/11/2021   Lab Results  Component Value Date   INSULIN  7.6 12/12/2023   INSULIN  5.7 08/15/2023   INSULIN  12.3 01/31/2023   INSULIN  10.0 05/10/2022   INSULIN  12.5 11/11/2021    Plan: Continue and increase dose Brittney Farmer  7.5 mg SQ weekly Increase fiber and water  and monitor constipation as SE.  Continue working on nutrition plan to decrease simple carbohydrates, increase lean proteins and exercise to promote weight loss, improve glycemic control and prevent progression to Type 2 diabetes.   Migraine with aura Currently managed with propranolol  20 mg twice daily for migraine prophylaxis. Reports good control of migraines with current regimen. - Continue propranolol  20 mg twice daily.  Vitamin D  deficiency Currently on ergocalciferol  50,000 units every 14 days. Recent vitamin D  levels are within a good range. Reports no N/V or muscle weakness with  Ergocalciferol  . Last vitamin D  Lab Results  Component Value Date   VD25OH 42.8 12/12/2023   Low vitamin D  levels can be associated with adiposity and may result in leptin resistance and weight gain. Also associated with fatigue.  Currently on vitamin D  supplementation without any adverse effects such as nausea, vomiting or muscle weakness.  - Continue/refill ergocalciferol  50,000 units every 14 days.  Vitamin B12 deficiency B12 levels are currently good. Discussed that B12 and other B vitamins are not stored in the body and excess is excreted. Reports some increased hair loss over the past few months.  Lab Results  Component Value Date   VITAMINB12 896 04/23/2024   - Consider biotin supplementation for hair and nail health. Monitor levels.   Elevated Alkaline phosphatase/? Metabolically associated liver dysfunction Liver function tests show slight elevation in alkaline phosphatase off and on for the past couple of years, likely due to metabolically associated liver dysfunction. Previous imaging, including abdominal CT and ultrasound,  showed no focal abnormalities/ no evidence of fatty infiltration of the liver. Weight loss is the primary intervention for improving liver function. - Will recheck liver function tests in March. - Encouraged weight loss as primary intervention.  Gastroesophageal reflux disease Managed with omeprazole  as needed, typically in the morning or afternoon depending on dietary intake. - Continue omeprazole  as prescribed. History of gastritis on previous EGD 2022  Nausea Experiencing nausea, particularly at night, likely related to Brittney Farmer . Managed with Brittney Farmer  as needed. - Continue/refill Brittney Farmer  as needed for nausea. - Encouraged hydration to help manage nausea.  Vitals Temp: 97.8 F (36.6 C) BP: (!) 100/56 Pulse Rate: 72 SpO2: 100 %   Anthropometric Measurements Height: 5' 3 (1.6 m) Weight: 225 lb (102.1 kg) BMI (Calculated): 39.87 Weight at Last  Visit: 232 lb Weight Lost Since Last Visit: 7 lb Weight Gained Since Last Visit: 0 Starting Weight: 235 lb Total Weight Loss (lbs): 10 lb (4.536 kg) Peak Weight: 256 lb   Body Composition  Body Fat %: 45.7 % Fat Mass (lbs): 103 lbs Muscle Mass (lbs): 116 lbs Total Body Water  (lbs): 81.6 lbs Visceral Fat Rating : 12   Other Clinical Data Fasting: yes Labs: no Today's Visit #: 48 Starting Date: 02/19/21     ASSESSMENT AND PLAN:  Diet: Brittney Farmer is currently in the action stage of change. As such, her goal is to continue with weight loss efforts. Brittney Farmer has agreed to Category 2 Plan.  Exercise: Brittney Farmer has been instructed to work up to a goal of 150 minutes of combined cardio and strengthening exercise per week for weight loss and overall health benefits.   Behavior Modification:  We discussed the following Behavioral Modification Strategies today: increasing lean protein intake, decreasing simple carbohydrates, increasing vegetables, increase H2O intake, increase high fiber foods, meal planning and cooking strategies, avoiding temptations, and planning for success. We discussed various medication options to help Brittney Farmer with her weight loss efforts and we both agreed to increase Brittney Farmer  to 7.5 mg weekly for medical weight loss and to improve insulin  resistance.  Return in about 4 weeks (around 06/11/2024).Brittney Farmer Brittney Farmer was informed of the importance of frequent follow up visits to maximize her success with intensive lifestyle modifications for her multiple health conditions.  Attestation Statements:   Reviewed by clinician on day of visit: allergies, medications, problem list, medical history, surgical history, family history, social history, and previous encounter notes.   Time spent on visit including pre-visit chart review and post-visit care and charting was 36 minutes.    Brittney Safran, PA-C  "

## 2024-05-14 ENCOUNTER — Encounter (INDEPENDENT_AMBULATORY_CARE_PROVIDER_SITE_OTHER): Payer: Self-pay | Admitting: Physician Assistant

## 2024-05-14 ENCOUNTER — Ambulatory Visit (INDEPENDENT_AMBULATORY_CARE_PROVIDER_SITE_OTHER): Admitting: Physician Assistant

## 2024-05-14 VITALS — BP 100/56 | HR 72 | Temp 97.8°F | Ht 63.0 in | Wt 225.0 lb

## 2024-05-14 DIAGNOSIS — K219 Gastro-esophageal reflux disease without esophagitis: Secondary | ICD-10-CM

## 2024-05-14 DIAGNOSIS — Z6839 Body mass index (BMI) 39.0-39.9, adult: Secondary | ICD-10-CM

## 2024-05-14 DIAGNOSIS — R748 Abnormal levels of other serum enzymes: Secondary | ICD-10-CM | POA: Diagnosis not present

## 2024-05-14 DIAGNOSIS — E669 Obesity, unspecified: Secondary | ICD-10-CM

## 2024-05-14 DIAGNOSIS — E88819 Insulin resistance, unspecified: Secondary | ICD-10-CM | POA: Diagnosis not present

## 2024-05-14 DIAGNOSIS — E559 Vitamin D deficiency, unspecified: Secondary | ICD-10-CM

## 2024-05-14 DIAGNOSIS — R110 Nausea: Secondary | ICD-10-CM

## 2024-05-14 DIAGNOSIS — E538 Deficiency of other specified B group vitamins: Secondary | ICD-10-CM

## 2024-05-14 DIAGNOSIS — G43109 Migraine with aura, not intractable, without status migrainosus: Secondary | ICD-10-CM | POA: Diagnosis not present

## 2024-05-14 DIAGNOSIS — E7849 Other hyperlipidemia: Secondary | ICD-10-CM

## 2024-05-14 MED ORDER — VITAMIN D (ERGOCALCIFEROL) 1.25 MG (50000 UNIT) PO CAPS: 50000.0000 [IU] | ORAL_CAPSULE | ORAL | 0 refills | Status: AC

## 2024-05-14 MED ORDER — TIRZEPATIDE-WEIGHT MANAGEMENT 7.5 MG/0.5ML ~~LOC~~ SOLN: 7.5000 mg | SUBCUTANEOUS | 1 refills | Status: AC

## 2024-05-14 MED ORDER — ONDANSETRON HCL 4 MG PO TABS: 4.0000 mg | ORAL_TABLET | Freq: Three times a day (TID) | ORAL | 2 refills | Status: AC | PRN

## 2024-05-15 ENCOUNTER — Ambulatory Visit: Payer: Self-pay | Admitting: Neurology

## 2024-05-15 ENCOUNTER — Encounter: Payer: Self-pay | Admitting: Neurology

## 2024-05-15 VITALS — BP 133/79 | HR 76 | Wt 231.0 lb

## 2024-05-15 DIAGNOSIS — G43001 Migraine without aura, not intractable, with status migrainosus: Secondary | ICD-10-CM

## 2024-05-15 DIAGNOSIS — M542 Cervicalgia: Secondary | ICD-10-CM | POA: Diagnosis not present

## 2024-05-15 MED ORDER — PROPRANOLOL HCL ER 60 MG PO CP24: 60.0000 mg | ORAL_CAPSULE | Freq: Every day | ORAL | 3 refills | Status: AC

## 2024-05-15 NOTE — Patient Instructions (Addendum)
 Migraine prevention:  Will change propranolol  to extended release 60 mg once daily Migraine rescue:  Continue Sumatriptan  100 mg as needed at headache onset, can repeat in 2 hours if needed Limit use of pain relievers to no more than 2 days out of week to prevent risk of rebound or medication-overuse headache. Keep headache diary  Return to clinic in 6 months  Please let me know if you have any questions or concerns in the meantime.  The physicians and staff at [facility] are committed to providing excellent care. You may receive a survey requesting feedback about your experience at our office. We strive to receive very good responses to the survey questions. If you feel that your experience would prevent you from giving the office a very good  response, please contact our office to try to remedy the situation. We may be reached at [phone]. Thank you for taking the time out of your busy day to complete the survey.  Venetia Potters, MD [facility]

## 2024-05-23 ENCOUNTER — Other Ambulatory Visit: Payer: Self-pay | Admitting: Family

## 2024-05-23 DIAGNOSIS — G43109 Migraine with aura, not intractable, without status migrainosus: Secondary | ICD-10-CM

## 2024-05-28 ENCOUNTER — Ambulatory Visit (HOSPITAL_BASED_OUTPATIENT_CLINIC_OR_DEPARTMENT_OTHER)

## 2024-06-06 ENCOUNTER — Other Ambulatory Visit (INDEPENDENT_AMBULATORY_CARE_PROVIDER_SITE_OTHER): Payer: Self-pay | Admitting: Physician Assistant

## 2024-06-06 DIAGNOSIS — E559 Vitamin D deficiency, unspecified: Secondary | ICD-10-CM

## 2024-06-11 ENCOUNTER — Ambulatory Visit (INDEPENDENT_AMBULATORY_CARE_PROVIDER_SITE_OTHER): Admitting: Physician Assistant

## 2024-07-09 ENCOUNTER — Ambulatory Visit (INDEPENDENT_AMBULATORY_CARE_PROVIDER_SITE_OTHER): Admitting: Physician Assistant

## 2024-07-23 ENCOUNTER — Ambulatory Visit: Admitting: Family

## 2024-12-05 ENCOUNTER — Ambulatory Visit: Payer: Self-pay | Admitting: Neurology
